# Patient Record
Sex: Female | Born: 1970 | Race: White | Hispanic: No | Marital: Married | Smoking: Former smoker
Health system: Southern US, Community
[De-identification: ages and names within clinical notes are randomized; demographics above are authoritative.]

## PROBLEM LIST (undated history)

## (undated) DIAGNOSIS — L4050 Arthropathic psoriasis, unspecified: Secondary | ICD-10-CM

## (undated) DIAGNOSIS — Z5189 Encounter for other specified aftercare: Secondary | ICD-10-CM

## (undated) DIAGNOSIS — I2510 Atherosclerotic heart disease of native coronary artery without angina pectoris: Secondary | ICD-10-CM

## (undated) DIAGNOSIS — F419 Anxiety disorder, unspecified: Secondary | ICD-10-CM

## (undated) DIAGNOSIS — I10 Essential (primary) hypertension: Secondary | ICD-10-CM

## (undated) DIAGNOSIS — E785 Hyperlipidemia, unspecified: Secondary | ICD-10-CM

## (undated) DIAGNOSIS — I639 Cerebral infarction, unspecified: Secondary | ICD-10-CM

## (undated) HISTORY — PX: FOOT SURGERY: SHX648

## (undated) HISTORY — DX: Encounter for other specified aftercare: Z51.89

## (undated) HISTORY — PX: HERNIA REPAIR: SHX51

## (undated) HISTORY — DX: Hyperlipidemia, unspecified: E78.5

## (undated) HISTORY — PX: CARPAL TUNNEL RELEASE: SHX101

## (undated) HISTORY — PX: CHOLECYSTECTOMY: SHX55

## (undated) HISTORY — PX: TOTAL HIP ARTHROPLASTY: SHX124

---

## 1999-12-22 ENCOUNTER — Other Ambulatory Visit: Admission: RE | Admit: 1999-12-22 | Discharge: 1999-12-22 | Payer: Self-pay | Admitting: Obstetrics and Gynecology

## 2000-06-07 ENCOUNTER — Other Ambulatory Visit: Admission: RE | Admit: 2000-06-07 | Discharge: 2000-06-07 | Payer: Self-pay | Admitting: Obstetrics and Gynecology

## 2001-01-01 ENCOUNTER — Ambulatory Visit (HOSPITAL_BASED_OUTPATIENT_CLINIC_OR_DEPARTMENT_OTHER): Admission: RE | Admit: 2001-01-01 | Discharge: 2001-01-01 | Payer: Self-pay | Admitting: Orthopedic Surgery

## 2001-01-17 ENCOUNTER — Ambulatory Visit (HOSPITAL_BASED_OUTPATIENT_CLINIC_OR_DEPARTMENT_OTHER): Admission: RE | Admit: 2001-01-17 | Discharge: 2001-01-17 | Payer: Self-pay | Admitting: Orthopedic Surgery

## 2001-03-18 ENCOUNTER — Other Ambulatory Visit: Admission: RE | Admit: 2001-03-18 | Discharge: 2001-03-18 | Payer: Self-pay | Admitting: *Deleted

## 2001-10-03 ENCOUNTER — Inpatient Hospital Stay (HOSPITAL_COMMUNITY): Admission: AD | Admit: 2001-10-03 | Discharge: 2001-10-06 | Payer: Self-pay | Admitting: *Deleted

## 2001-10-09 ENCOUNTER — Encounter: Admission: RE | Admit: 2001-10-09 | Discharge: 2001-11-08 | Payer: Self-pay | Admitting: Obstetrics and Gynecology

## 2001-10-30 ENCOUNTER — Other Ambulatory Visit: Admission: RE | Admit: 2001-10-30 | Discharge: 2001-10-30 | Payer: Self-pay | Admitting: Obstetrics and Gynecology

## 2002-11-04 ENCOUNTER — Other Ambulatory Visit: Admission: RE | Admit: 2002-11-04 | Discharge: 2002-11-04 | Payer: Self-pay | Admitting: Obstetrics and Gynecology

## 2002-12-31 ENCOUNTER — Encounter (INDEPENDENT_AMBULATORY_CARE_PROVIDER_SITE_OTHER): Payer: Self-pay | Admitting: *Deleted

## 2002-12-31 ENCOUNTER — Ambulatory Visit (HOSPITAL_COMMUNITY): Admission: RE | Admit: 2002-12-31 | Discharge: 2002-12-31 | Payer: Self-pay | Admitting: Obstetrics and Gynecology

## 2003-12-15 ENCOUNTER — Other Ambulatory Visit: Admission: RE | Admit: 2003-12-15 | Discharge: 2003-12-15 | Payer: Self-pay | Admitting: Obstetrics and Gynecology

## 2004-02-29 ENCOUNTER — Encounter (INDEPENDENT_AMBULATORY_CARE_PROVIDER_SITE_OTHER): Payer: Self-pay | Admitting: Specialist

## 2004-02-29 ENCOUNTER — Ambulatory Visit (HOSPITAL_COMMUNITY): Admission: RE | Admit: 2004-02-29 | Discharge: 2004-02-29 | Payer: Self-pay | Admitting: Obstetrics and Gynecology

## 2004-09-03 ENCOUNTER — Inpatient Hospital Stay (HOSPITAL_COMMUNITY): Admission: AD | Admit: 2004-09-03 | Discharge: 2004-09-03 | Payer: Self-pay | Admitting: Obstetrics and Gynecology

## 2004-09-14 ENCOUNTER — Other Ambulatory Visit: Admission: RE | Admit: 2004-09-14 | Discharge: 2004-09-14 | Payer: Self-pay | Admitting: Obstetrics and Gynecology

## 2005-03-12 ENCOUNTER — Inpatient Hospital Stay (HOSPITAL_COMMUNITY): Admission: AD | Admit: 2005-03-12 | Discharge: 2005-03-12 | Payer: Self-pay | Admitting: Obstetrics and Gynecology

## 2005-03-26 ENCOUNTER — Inpatient Hospital Stay (HOSPITAL_COMMUNITY): Admission: AD | Admit: 2005-03-26 | Discharge: 2005-03-29 | Payer: Self-pay | Admitting: Obstetrics and Gynecology

## 2005-05-03 ENCOUNTER — Other Ambulatory Visit: Admission: RE | Admit: 2005-05-03 | Discharge: 2005-05-03 | Payer: Self-pay | Admitting: Obstetrics and Gynecology

## 2005-12-21 ENCOUNTER — Ambulatory Visit (HOSPITAL_BASED_OUTPATIENT_CLINIC_OR_DEPARTMENT_OTHER): Admission: RE | Admit: 2005-12-21 | Discharge: 2005-12-21 | Payer: Self-pay | Admitting: Orthopedic Surgery

## 2006-01-01 ENCOUNTER — Encounter: Admission: RE | Admit: 2006-01-01 | Discharge: 2006-01-01 | Payer: Self-pay | Admitting: Orthopedic Surgery

## 2007-06-06 ENCOUNTER — Inpatient Hospital Stay (HOSPITAL_COMMUNITY): Admission: AD | Admit: 2007-06-06 | Discharge: 2007-06-07 | Payer: Self-pay | Admitting: Obstetrics & Gynecology

## 2007-06-29 ENCOUNTER — Encounter (INDEPENDENT_AMBULATORY_CARE_PROVIDER_SITE_OTHER): Payer: Self-pay | Admitting: Obstetrics and Gynecology

## 2007-06-29 ENCOUNTER — Inpatient Hospital Stay (HOSPITAL_COMMUNITY): Admission: AD | Admit: 2007-06-29 | Discharge: 2007-07-01 | Payer: Self-pay | Admitting: Obstetrics and Gynecology

## 2008-09-21 ENCOUNTER — Inpatient Hospital Stay (HOSPITAL_COMMUNITY): Admission: AD | Admit: 2008-09-21 | Discharge: 2008-09-21 | Payer: Self-pay | Admitting: Obstetrics and Gynecology

## 2008-10-09 ENCOUNTER — Inpatient Hospital Stay (HOSPITAL_COMMUNITY): Admission: AD | Admit: 2008-10-09 | Discharge: 2008-10-09 | Payer: Self-pay | Admitting: Obstetrics and Gynecology

## 2008-10-10 ENCOUNTER — Inpatient Hospital Stay (HOSPITAL_COMMUNITY): Admission: AD | Admit: 2008-10-10 | Discharge: 2008-10-10 | Payer: Self-pay | Admitting: Obstetrics and Gynecology

## 2008-10-15 ENCOUNTER — Encounter (INDEPENDENT_AMBULATORY_CARE_PROVIDER_SITE_OTHER): Payer: Self-pay | Admitting: Obstetrics and Gynecology

## 2008-10-15 ENCOUNTER — Inpatient Hospital Stay (HOSPITAL_COMMUNITY): Admission: AD | Admit: 2008-10-15 | Discharge: 2008-10-17 | Payer: Self-pay | Admitting: Obstetrics and Gynecology

## 2008-10-29 ENCOUNTER — Ambulatory Visit (HOSPITAL_COMMUNITY): Admission: RE | Admit: 2008-10-29 | Discharge: 2008-10-29 | Payer: Self-pay | Admitting: Obstetrics and Gynecology

## 2008-10-29 ENCOUNTER — Encounter (INDEPENDENT_AMBULATORY_CARE_PROVIDER_SITE_OTHER): Payer: Self-pay | Admitting: Obstetrics and Gynecology

## 2008-12-15 ENCOUNTER — Ambulatory Visit (HOSPITAL_COMMUNITY): Admission: RE | Admit: 2008-12-15 | Discharge: 2008-12-15 | Payer: Self-pay | Admitting: Obstetrics and Gynecology

## 2008-12-24 ENCOUNTER — Ambulatory Visit (HOSPITAL_BASED_OUTPATIENT_CLINIC_OR_DEPARTMENT_OTHER): Admission: RE | Admit: 2008-12-24 | Discharge: 2008-12-24 | Payer: Self-pay | Admitting: Orthopedic Surgery

## 2009-10-26 ENCOUNTER — Ambulatory Visit (HOSPITAL_BASED_OUTPATIENT_CLINIC_OR_DEPARTMENT_OTHER): Admission: RE | Admit: 2009-10-26 | Discharge: 2009-10-26 | Payer: Self-pay | Admitting: Orthopedic Surgery

## 2010-05-23 ENCOUNTER — Ambulatory Visit: Payer: Self-pay | Admitting: Pulmonary Disease

## 2010-05-24 ENCOUNTER — Telehealth: Payer: Self-pay | Admitting: Pulmonary Disease

## 2010-05-31 ENCOUNTER — Telehealth (INDEPENDENT_AMBULATORY_CARE_PROVIDER_SITE_OTHER): Payer: Self-pay | Admitting: *Deleted

## 2010-06-04 DIAGNOSIS — G4710 Hypersomnia, unspecified: Secondary | ICD-10-CM

## 2010-06-05 ENCOUNTER — Telehealth (INDEPENDENT_AMBULATORY_CARE_PROVIDER_SITE_OTHER): Payer: Self-pay | Admitting: *Deleted

## 2010-06-20 ENCOUNTER — Telehealth: Payer: Self-pay | Admitting: Pulmonary Disease

## 2010-07-11 ENCOUNTER — Telehealth (INDEPENDENT_AMBULATORY_CARE_PROVIDER_SITE_OTHER): Payer: Self-pay | Admitting: *Deleted

## 2010-07-24 ENCOUNTER — Telehealth: Payer: Self-pay | Admitting: Pulmonary Disease

## 2010-08-02 ENCOUNTER — Telehealth (INDEPENDENT_AMBULATORY_CARE_PROVIDER_SITE_OTHER): Payer: Self-pay | Admitting: *Deleted

## 2010-08-16 ENCOUNTER — Ambulatory Visit: Payer: Self-pay | Admitting: Pulmonary Disease

## 2010-09-05 DIAGNOSIS — G2581 Restless legs syndrome: Secondary | ICD-10-CM | POA: Insufficient documentation

## 2010-10-03 NOTE — Assessment & Plan Note (Signed)
Summary: consult for hypersomnia   Visit Type:  Initial Consult Copy to:  Harold Hedge Primary Provider/Referring Provider:  Harold Hedge  CC:  sleep consult.Marland Kitchen  History of Present Illness: the pt is a 40y/o female who I have been asked to see for severe fatigue and daytime sleepiness over the last 3-50mos.  The pt has 4 children ages 19mos, 3y/o, 39 y/o 24 y/o, and they keep her awake 5/7 nights.  They may awaken her 1-3 times a night.  She goes to bed btw 8-9pm, and arises at 6am to start her day.  She does not feel rested upon arising.  Her husband has not complained about snoring or kicking, and the pt denies symptoms of RLS.  She uses caffeine to help with her alertness during the day, and notes significant daytime sleepiness is she sits down during the day.  She also describes severe fatigue.  She denies any sleepiness driving.  Her epworth score today is 18.  She has had a thyroid and hormonal evaluation by GYN with nothing being found.  Her weight is up about 15 pounds since her delivery.  Preventive Screening-Counseling & Management  Alcohol-Tobacco     Smoking Status: never  Problems Prior to Update: None  Current Medications (verified): 1)  Fish Oil 1200 Mg Caps (Omega-3 Fatty Acids) .... Once Daily 2)  Vitamin D 1000 Unit Tabs (Cholecalciferol) .... Once Daily 3)  Super B Complex  Tabs (B Complex-C) .... Once Daily 4)  Daily Multiple Vitamins  Tabs (Multiple Vitamin) .... Once Daily 5)  Lexapro 20 Mg Tabs (Escitalopram Oxalate) .... Once Daily  Allergies (verified): No Known Drug Allergies  Past History:  Past Medical History: none per pt  Past Surgical History: Cholecystectomy sinus surgery tubal ligation Right knee surgery  Family History: Reviewed history and no changes required. Emphysema--MGM Asthma--sister Colon cancer--PGF, MGF  Social History: Reviewed history and no changes required. Patient never smoked.  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  The patient denies shortness of breath with activity, shortness of breath at rest, non-productive cough, coughing up blood, chest pain, irregular heartbeats, acid heartburn, indigestion, loss of appetite, abdominal pain, difficulty swallowing, sore throat, tooth/dental problems, headaches, itching, ear ache, anxiety, depression, hand/feet swelling, joint stiffness or pain, rash, change in color of mucus, and fever.    Vital Signs:  Patient profile:   40 year old female Height:      64 inches Weight:      196.50 pounds BMI:     33.85 O2 Sat:      100 % on Room air Temp:     98.4 degrees F oral Pulse rate:   94 / minute BP sitting:   150 / 92  (right arm) Cuff size:   regular  Vitals Entered By: Carver Fila (May 23, 2010 10:52 AM)  O2 Flow:  Room air CC: sleep consult. Comments meds and allergies updated Phone number updated Carver Fila  May 23, 2010 10:52 AM    Physical Exam  General:  ow female in nad Nose:  patent without discharge, no purulence Mouth:  normal anatomy, no lesions or exudates Neck:  no jvd, tmg, LN Lungs:  totally clear to auscultation. Heart:  rrr, no mrg Abdomen:  soft and nontender, bs+ Extremities:  no edema or cyanosis  pulses intact distally. Neurologic:  alert, but appears sleepy, moves all 4.   Impression & Recommendations:  Problem # 1:  HYPERSOMNIA (ICD-780.54)  the pt has significant daytime sleepiness that is most likely due to extremely disrupted sleep associated with the care of her children.  She is awakened 1-3 times a night, 5 out of 7 nights.  She denies any symptoms c/w a sleep disorder, and she apparently has had a hormonal evaluation by GYN.  I have given her a few things to consider, and she may also  discuss the sleep issues with her pediiatrician since it involves all of her children and not just the 19mos old.  Finally, I have asked her to talk with her husband about loud snoring or kicking , and to let me know if this is an issue.  Medications Added to Medication List This Visit: 1)  Fish Oil 1200 Mg Caps (Omega-3 fatty acids) .... Once daily 2)  Vitamin D 1000 Unit Tabs (Cholecalciferol) .... Once daily 3)  Super B Complex Tabs (B complex-c) .... Once daily 4)  Daily Multiple Vitamins Tabs (Multiple vitamin) .... Once daily 5)  Lexapro 20 Mg Tabs (Escitalopram oxalate) .... Once daily  Other Orders: Consultation Level IV (40981)  Patient Instructions: 1)  please ask husband if you kick during the night or have snoring.  Let me know if this is the case. 2)  work out shifts with husband to take care of the kids during the night since you are struggling so much during day 3)  consider baby sitters for 1-2 hours during afternoon to allow you to take short naps. 4)  moderate use of caffeine. 5)  If continues, can consider behavioral medicine consult for possible depression?

## 2010-10-03 NOTE — Progress Notes (Signed)
Summary: update on requip > will continue requip.  f/u appt 11.18.11  Phone Note Call from Patient Call back at [phone]   Caller: Patient Call For: clance Summary of Call: pt still feeling foggy in the am. have questions about requip. Initial call taken by: Rickard Patience,  July 11, 2010 9:04 AM  Follow-up for Phone Call        called spoke with patient who states that requip is still working very well for her and the afternoon fogginess has improved, but states the fogginess is still significant in the mornings.  pt states she has adjusted the times that she takes the requip to see if that helps, but finds that dinner time works best for her.  would like to know KC's recs. Boone Master CNA/MA  July 11, 2010 9:57 AM   Additional Follow-up for Phone Call Additional follow up Details #1::        I think it comes down to what you can accept and what you can't.  We can try other medications that are in the requip family...i.e. mirapex or gabapentin. Or, she can keep with requip for now and see how things go.  I am not sure her fogginess during the day is really due to the mirapex...don't know.  If she wants to try mirapex 0.25mg  in the evening at the time of her choosing on full stomach preferrably.  Can call in #30, no fills. Additional Follow-up by: Barbaraann Share MD,  July 11, 2010 10:43 AM    Additional Follow-up for Phone Call Additional follow up Details #2::    called spoke with patient who prefers to continue the requip.  she does request to follow up w/ [facility] about this before the holidays.  appt scheduled 11.18.11 @ 1415.  pt okay with this date and time. Boone Master CNA/MA  July 11, 2010 11:28 AM

## 2010-10-03 NOTE — Progress Notes (Signed)
Summary: requip working well  Phone Note Call from Patient Call back at [phone]   Caller: Patient Call For: Jaculin Rasmus Summary of Call: pt want to report that requip is working  fine for her. she was told to call with update. Initial call taken by: Rickard Patience,  June 20, 2010 8:43 AM  Follow-up for Phone Call        Pt states requip is workin well in that it is helping her sleep more solid, she  is not kicking in her sleep and she doesn ot feel tired inthe afternoons. Pt does states she feels "foggy" in the early afternoon, not tired just feels like she is "not with it." Please advise. Carron Curie CMA  June 20, 2010 9:50 AM   Additional Follow-up for Phone Call Additional follow up Details #1::        glad requip has helped.  would stay on the medication for now and see if things fall into place with more time.  I don't think the requip is responsible for her afternoon fogginess...she does have alot going on right now, and I would give more time to improve.  Needs to see me in 6mos. Additional Follow-up by: Barbaraann Share MD,  June 20, 2010 12:52 PM    Additional Follow-up for Phone Call Additional follow up Details #2::    Pt advised and will call back and schedule appt. Carron Curie CMA  June 20, 2010 2:03 PM

## 2010-10-03 NOTE — Progress Notes (Signed)
Summary: nos appt  Phone Note Call from Patient   Caller: [email or web address]  Call For: [name] [name] Summary of Call: LMTCB x2 to rsc nos from 11/18. Initial call taken by: Darletta Moll,  July 24, 2010 2:47 PM

## 2010-10-03 NOTE — Progress Notes (Signed)
Summary: leg movements at night-rx requip  Phone Note Call from Patient Call back at Home Phone [phone] Call back at [phone] cell   Caller: Patient Call For: clance Reason for Call: Talk to Nurse Summary of Call: question about whether or not pt kicks in her sleep - answer per her husband is yes. Initial call taken by: Eugene Gavia,  May 24, 2010 8:27 AM  Follow-up for Phone Call        Pt states that husband says she does kick during the night when she is asleep. He states she does not snore. Carron Curie CMA  May 24, 2010 9:28 AM  [facility]  Additional Follow-up for Phone Call Additional follow up Details #1::        let her know that she may have sleep disruption from what we call period leg movements.  We usually treat with a medication called requip...will know in 3 weeks whether helping or not.  Can't take if you are breast feeding for some reason. can try requip 0.5mg   one each night after dinner.  If helps, but still kicking, can increase to 2 tabs.  give me feedback in 3 weeks to let me know if better. call in #42, no fills. Additional Follow-up by: Barbaraann Share MD,  May 24, 2010 10:03 AM    Additional Follow-up for Phone Call Additional follow up Details #2::    Pt advised of recs and rx sent. pt advisd to call in 3 weeks to let us know progress. Carron Curie CMA  May 24, 2010 10:12 AM   New/Updated Medications: REQUIP 0.5 MG TABS (ROPINIROLE HCL) one tablet daily after dinner, if helps but still kicking increase to 2 tablets Prescriptions: REQUIP 0.5 MG TABS (ROPINIROLE HCL) one tablet daily after dinner, if helps but still kicking increase to 2 tablets  #42 x 0   Entered by:   Carron Curie CMA   Authorized by:   Barbaraann Share MD   Signed by:   Carron Curie CMA on 05/24/2010   Method used:   [facility]        [facility]  [address] [phone]* (retail)       [address]       [place], [place]  [id number]       Ph: [phone] or [phone]       Fax: [phone]   RxID:   [id number]

## 2010-10-03 NOTE — Progress Notes (Signed)
Summary: NEEDS REFILL ASAP - LMTCB x 1  Phone Note Call from Patient Call back at Home Phone [phone]   Caller: Patient Call For: [name] [name] Summary of Call: PT IS COMPLETELY OUT OF REQUIP. WAITING ON REFILL. [facility] [address]  Initial call taken by: Tivis Ringer, CNA,  August 02, 2010 10:54 AM  Follow-up for Phone Call        [facility] to verify message. Gweneth Dimitri RN  August 02, 2010 12:43 PM  Spoke with pt and advised refill was sent to pharm.  Pt verbalized understanding. Follow-up by: Vernie Murders,  August 02, 2010 2:49 PM    Prescriptions: REQUIP 0.5 MG TABS (ROPINIROLE HCL) one tablet daily after dinner, if helps but still kicking increase to 2 tablets  #42 Tablet x 5   Entered by:   Vernie Murders   Authorized by:   Barbaraann Share MD   Signed by:   Vernie Murders on 08/02/2010   Method used:   [facility]        [facility]  [address] [phone]* (retail)       [address]       [place], [place]  [id number]       Ph: [phone] or [phone]       Fax: [phone]   RxID:   [id number]

## 2010-10-03 NOTE — Progress Notes (Signed)
Summary: update on requip  Phone Note Call from Patient Call back at [phone]   Caller: Patient Call For: clance Summary of Call: Pt states the requip is doing great except she feels foggy in the mornings and early afternoons, pls advise.//[facility] [address] Initial call taken by: Darletta Moll,  June 05, 2010 8:23 AM  Follow-up for Phone Call        Spoke with pt.  She states that she has been taking her reuip in the early afternoons and this is not helping with feeling "foggy".  She states that she feels foggy in the am and usually does not start to feel normal until around noon.  She states that she has been taking naps during the day when her kids sleep and her spouse has been better about waking up with the kids in the night so she is sleeping better.  Pls advise thanks!  Follow-up by: Vernie Murders,  June 05, 2010 9:33 AM  Additional Follow-up for Phone Call Additional follow up Details #1::        does she think the requip has helped kicking and her actual sleep at night??  Does she think the fogginess could be from chronic sleep deprivation? or has it happened only since she has been on requip? Additional Follow-up by: Barbaraann Share MD,  June 05, 2010 5:17 PM    Additional Follow-up for Phone Call Additional follow up Details #2::    LMOM TCB. Boone Master CNA/MA  June 05, 2010 5:28 PM   Spoke with pt.  She says requip has helped kicking and actual sleep at night.  States she does not think the fogginess could be coming from chronic sleep deprivation bc she is having extra help as suggested by [facility].  Also states the fogginess has been going on prior to starting requip.  Will forward message back to [facility].   Gweneth Dimitri RN  June 05, 2010 5:32 PM   Additional Follow-up for Phone Call Additional follow up Details #3:: Details for Additional Follow-up Action Taken: If she had the fogginess before starting requip, then it is not the requip causing it.  I suspect  she is just making up her sleep debt now, and she has not caught up.  She if she is willing to give this another 4 weeks or so to see if it slowly improves.  I don't have any other sleep recommendations other than what we have discussed. Additional Follow-up by: Barbaraann Share MD,  June 05, 2010 5:38 PM    I spoke with patient; she is aware of KC's recs. She will give the medication another 4 weeks to see if this will slow down; she will call us back in 4 weeks to let us know how she is doing. Reynaldo Minium CMA  June 06, 2010 10:41 AM

## 2010-10-03 NOTE — Progress Notes (Signed)
Summary: questions regarding requip- feels sluggish  Phone Note Call from Patient   Caller: Patient Call For: clance Summary of Call: requip is working but she wakes up in the am feeling sluggish. Initial call taken by: Rickard Patience,  May 31, 2010 8:51 AM  Follow-up for Phone Call        called and spoke with pt.  pt states she is currently on Requip 0.5mg   and takes 1 tab after dinner.  Pt states her sleep has improved but c/o waking up feeling "sluggish, spacey, and thoughts are delayed."  Pt states she starts to feel better in the afternoons.  Please advise.  Thank you.  Aundra Millet Reynolds LPN  May 31, 2010 9:26 AM   Additional Follow-up for Phone Call Additional follow up Details #1::        chronic sleep deprivation can cause delayed thoughts and sluggishness. have her try to take requip in late afternoon rather than after dinner and see if she notices a difference.  this may not be the answer to her problems, and this may be just what we discussed..not sleeping enough and fragmented sleep due to kids. Additional Follow-up by: Barbaraann Share MD,  May 31, 2010 2:01 PM    Additional Follow-up for Phone Call Additional follow up Details #2::    called and spoke with pt.  pt aware of KC's response and recs.  Pt states she will try taking Requip in the late afternoon and see if this helps.   Aundra Millet Reynolds LPN  May 31, 2010 2:37 PM

## 2010-10-05 NOTE — Assessment & Plan Note (Signed)
Summary: rov for PLMS, sleep hygiene issues   Copy to:  Harold Hedge Primary Provider/Referring Provider:  Harold Hedge  CC:  Pt is here for a routine f/u appt.  Pt states Requip helps with leg jerks at night but does c/o "fogginess in the mornings." .  History of Present Illness: the pt comes in today for f/u of her disrupted sleep and hypersomnia/fogginess.  At the last visit, I felt a lot of this was due to disrupted sleep from her young children.  I had asked her though to discuss with her husband the possibility of leg kicks. She did  feel this was an issue, and was started on requip as a trial.  She has seen definite improvement in her sleep and how she feels the next day.  Her husband has been helping with the children during the night as well.  She continues to have some "fogginess" in the mornings upon arising, but feels rested.  She is not having a lot of EDS.  Medications Prior to Update: 1)  Fish Oil 1200 Mg Caps (Omega-3 Fatty Acids) .... Once Daily 2)  Vitamin D 1000 Unit Tabs (Cholecalciferol) .... Once Daily 3)  Super B Complex  Tabs (B Complex-C) .... Once Daily 4)  Daily Multiple Vitamins  Tabs (Multiple Vitamin) .... Once Daily 5)  Lexapro 20 Mg Tabs (Escitalopram Oxalate) .... Once Daily 6)  Requip 0.5 Mg Tabs (Ropinirole Hcl) .... One Tablet Daily After Dinner, If Helps But Still Kicking Increase To 2 Tablets  Allergies (verified): No Known Drug Allergies  Review of Systems  The patient denies shortness of breath with activity, shortness of breath at rest, productive cough, non-productive cough, coughing up blood, chest pain, irregular heartbeats, acid heartburn, indigestion, loss of appetite, weight change, abdominal pain, difficulty swallowing, sore throat, tooth/dental problems, headaches, nasal congestion/difficulty breathing through nose, sneezing, itching, ear ache, anxiety, depression, hand/feet swelling, joint stiffness or pain, rash, change in color of mucus,  and fever.    Vital Signs:  Patient profile:   40 year old female Height:      64 inches Weight:      201 pounds BMI:     34.63 O2 Sat:      99 % on Room air Temp:     97.7 degrees F oral Pulse rate:   88 / minute BP sitting:   132 / 90  (right arm) Cuff size:   large  Vitals Entered By: Arman Filter LPN (September 05, 2010 9:18 AM)  O2 Flow:  Room air CC: Pt is here for a routine f/u appt.  Pt states Requip helps with leg jerks at night but does c/o "fogginess in the mornings."  Comments Medications reviewed with patient Arman Filter LPN  September 05, 2010 9:20 AM    Physical Exam  General:  ow female in nad Extremities:  no edema or cyanosis  Neurologic:  alert, does not appear sleepy, moves all 4.   Impression & Recommendations:  Problem # 1:  RESTLESS LEGS SYNDROME (ICD-333.94)  the pt is doing much better on requip, and feels it has greatly helped her sleep.  I have asked her to continue with the medication, and to let me know if symptoms worsen.    Problem # 2:  HYPERSOMNIA (ICD-780.54) the pt is doing much better with treatment of PLMS, and has improved sleep hygiene as well with assistance of her husband with the children during the night.  There is nothing to suggest  another underlying sleep disorder, but would suggest npsg if she has worsening symptoms.  Medications Added to Medication List This Visit: 1)  Requip 0.5 Mg Tabs (Ropinirole hcl) .... As directed. 2)  Requip 0.5 Mg Tabs (Ropinirole hcl) .... As directed  Other Orders: Est. Patient Level III (14782)  Patient Instructions: 1)  continue requip, work on sleep hygiene as much as you can 2)  work on weight reduction 3)  if doing well, will see you in one year.  Prescriptions: REQUIP 0.5 MG  TABS (ROPINIROLE HCL) as directed  #180 x 4   Entered and Authorized by:   Barbaraann Share MD   Signed by:   Barbaraann Share MD on 09/05/2010   Method used:   Print then Give to Patient   RxID:    [id number] REQUIP 0.5 MG  TABS (ROPINIROLE HCL) as directed.  #60 x 1   Entered and Authorized by:   Barbaraann Share MD   Signed by:   Barbaraann Share MD on 09/05/2010   Method used:   Print then Give to Patient   RxID:   [id number]

## 2010-11-22 LAB — POCT HEMOGLOBIN-HEMACUE: Hemoglobin: 13.8 g/dL (ref 12.0–15.0)

## 2010-12-13 LAB — CBC
RBC: 4.09 MIL/uL (ref 3.87–5.11)
WBC: 10.6 10*3/uL — ABNORMAL HIGH (ref 4.0–10.5)

## 2010-12-13 LAB — HCG, SERUM, QUALITATIVE: Preg, Serum: NEGATIVE

## 2010-12-19 LAB — URINALYSIS, ROUTINE W REFLEX MICROSCOPIC
Bilirubin Urine: NEGATIVE
Hgb urine dipstick: NEGATIVE
Nitrite: NEGATIVE
Specific Gravity, Urine: 1.005 (ref 1.005–1.030)
Specific Gravity, Urine: <1.005 — ABNORMAL LOW (ref 1.005–1.030)
Urobilinogen, UA: 0.2 mg/dL (ref 0.0–1.0)
pH: 6.5 (ref 5.0–8.0)

## 2010-12-19 LAB — CBC
HCT: 27 % — ABNORMAL LOW (ref 36.0–46.0)
HCT: 32.6 % — ABNORMAL LOW (ref 36.0–46.0)
Hemoglobin: 10.5 g/dL — ABNORMAL LOW (ref 12.0–15.0)
Hemoglobin: 10.7 g/dL — ABNORMAL LOW (ref 12.0–15.0)
Hemoglobin: 9.1 g/dL — ABNORMAL LOW (ref 12.0–15.0)
Hemoglobin: 10.4 g/dL — ABNORMAL LOW (ref 12.0–15.0)
MCHC: 32.4 g/dL (ref 30.0–36.0)
MCHC: 32.8 g/dL (ref 30.0–36.0)
MCHC: 32.9 g/dL (ref 30.0–36.0)
MCHC: 32.4 g/dL (ref 30.0–36.0)
MCV: 87.4 fL (ref 78.0–100.0)
MCV: 87.5 fL (ref 78.0–100.0)
MCV: 88.4 fL (ref 78.0–100.0)
Platelets: 177 10*3/uL (ref 150–400)
Platelets: 193 10*3/uL (ref 150–400)
Platelets: 224 10*3/uL (ref 150–400)
RBC: 3.59 MIL/uL — ABNORMAL LOW (ref 3.87–5.11)
RBC: 3.73 MIL/uL — ABNORMAL LOW (ref 3.87–5.11)
RBC: 3.63 MIL/uL — ABNORMAL LOW (ref 3.87–5.11)
RDW: 13.4 % (ref 11.5–15.5)
RDW: 13.8 % (ref 11.5–15.5)
RDW: 13.9 % (ref 11.5–15.5)
WBC: 11.7 10*3/uL — ABNORMAL HIGH (ref 4.0–10.5)
WBC: 17 10*3/uL — ABNORMAL HIGH (ref 4.0–10.5)
WBC: 8.8 10*3/uL (ref 4.0–10.5)
WBC: 5.6 10*3/uL (ref 4.0–10.5)

## 2010-12-19 LAB — LACTATE DEHYDROGENASE
LDH: 226 U/L (ref 94–250)
LDH: 232 U/L (ref 94–250)
LDH: 270 U/L — ABNORMAL HIGH (ref 94–250)

## 2010-12-19 LAB — DIFFERENTIAL
Basophils Relative: 0 % (ref 0–1)
Lymphs Abs: 2.1 10*3/uL (ref 0.7–4.0)
Monocytes Absolute: 0.4 10*3/uL (ref 0.1–1.0)
Monocytes Relative: 4 % (ref 3–12)
Neutro Abs: 8.8 10*3/uL — ABNORMAL HIGH (ref 1.7–7.7)

## 2010-12-19 LAB — COMPREHENSIVE METABOLIC PANEL
ALT: 38 U/L — ABNORMAL HIGH (ref 0–35)
ALT: 41 U/L — ABNORMAL HIGH (ref 0–35)
ALT: 50 U/L — ABNORMAL HIGH (ref 0–35)
AST: 39 U/L — ABNORMAL HIGH (ref 0–37)
AST: 48 U/L — ABNORMAL HIGH (ref 0–37)
Albumin: 2.1 g/dL — ABNORMAL LOW (ref 3.5–5.2)
Albumin: 2.3 g/dL — ABNORMAL LOW (ref 3.5–5.2)
Albumin: 2.9 g/dL — ABNORMAL LOW (ref 3.5–5.2)
Alkaline Phosphatase: 104 U/L (ref 39–117)
Alkaline Phosphatase: 108 U/L (ref 39–117)
Alkaline Phosphatase: 136 U/L — ABNORMAL HIGH (ref 39–117)
BUN: 2 mg/dL — ABNORMAL LOW (ref 6–23)
BUN: 2 mg/dL — ABNORMAL LOW (ref 6–23)
BUN: 5 mg/dL — ABNORMAL LOW (ref 6–23)
BUN: 8 mg/dL (ref 6–23)
CO2: 24 mEq/L (ref 19–32)
CO2: 24 mEq/L (ref 19–32)
Calcium: 7.2 mg/dL — ABNORMAL LOW (ref 8.4–10.5)
Calcium: 8.6 mg/dL (ref 8.4–10.5)
Calcium: 8.6 mg/dL (ref 8.4–10.5)
Chloride: 104 mEq/L (ref 96–112)
Creatinine, Ser: 0.56 mg/dL (ref 0.4–1.2)
Creatinine, Ser: 0.59 mg/dL (ref 0.4–1.2)
GFR calc Af Amer: >60 mL/min (ref >60)
GFR calc non Af Amer: >60 mL/min (ref >60)
Glucose, Bld: 117 mg/dL — ABNORMAL HIGH (ref 70–99)
Glucose, Bld: 137 mg/dL — ABNORMAL HIGH (ref 70–99)
Potassium: 3.7 mEq/L (ref 3.5–5.1)
Potassium: 3.8 mEq/L (ref 3.5–5.1)
Potassium: 4.3 mEq/L (ref 3.5–5.1)
Sodium: 134 mEq/L — ABNORMAL LOW (ref 135–145)
Sodium: 135 mEq/L (ref 135–145)
Sodium: 136 mEq/L (ref 135–145)
Total Bilirubin: 0.3 mg/dL (ref 0.3–1.2)
Total Protein: 4.5 g/dL — ABNORMAL LOW (ref 6.0–8.3)
Total Protein: 5.7 g/dL — ABNORMAL LOW (ref 6.0–8.3)
Total Protein: 6.1 g/dL (ref 6.0–8.3)

## 2010-12-19 LAB — RPR: RPR Ser Ql: NONREACTIVE

## 2010-12-19 LAB — URIC ACID: Uric Acid, Serum: 5.9 mg/dL (ref 2.4–7.0)

## 2011-01-16 NOTE — Op Note (Signed)
[name] [name], [name] [name]           ACCOUNT NO.:  [id number]   MEDICAL RECORD NO.:  [id number]          PATIENT TYPE:  AMB   LOCATION:  SDC                           FACILITY:  [facility]   PHYSICIAN:  Guy Sandifer. Henderson Cloud, M.D. DATE OF BIRTH:  May 22, 1971   DATE OF PROCEDURE:  DATE OF DISCHARGE:                               OPERATIVE REPORT   PREOPERATIVE DIAGNOSIS:  Desires permanent sterilization.   POSTOPERATIVE DIAGNOSIS:  Desires permanent sterilization.   PROCEDURE:  Laparoscopy with bilateral tubal ligation with Filshie  clips.   SURGEON:  Guy Sandifer. Henderson Cloud, MD   ANESTHESIA:  General endotracheal intubation.   ESTIMATED BLOOD LOSS:  Minimal.   INDICATIONS AND CONSENT:  This patient is a 40 year old married white  female G4, P4, status post vaginal delivery healthy child on October 15, 2008.  Desires permanent sterilization.  Alternatives have been  discussed.  Potential risks and complications of laparoscopic tubal  ligation with Filshie clips have been reviewed.  Preoperatively  including but not limited to infection, organ damage, bleeding,  requiring transfusion of blood products with HIV and hepatitis  acquisition, DVT, PE, pneumonia.  Permanence of the procedure, failure  rate increased, ectopic risk have been reviewed.  All questions were  answered and consent is signed on the chart.   FINDINGS:  Upper abdomen is grossly normal.  Appendix is normal.  Uterus  is normal, multiparous size, smooth in contour.  Anterior and posterior  cul-de-sacs were normal.  Tubes and ovaries normal bilaterally.   PROCEDURE IN DETAIL:  The patient is taken to the operating room where  she is identified, placed in dorsal supine position, and general  anesthesia was induced via endotracheal intubation.  She was then placed  in a dorsal lithotomy position where she was prepped abdominally and  vaginally.  Bladder straight catheterized.  Hulka tenaculum was placed.  The uterus was manipulated,  and she was draped in a sterile fashion.  Time-out was then undertaken.  The infraumbilical and suprapubic areas  injected in midline with 1% plain Xylocaine.  A small infraumbilical  incision was made.  Disposable Veress needle was placed on the first  attempt without difficulty.  A normal syringe and drop test are noted.  2 liters of gas were then insufflated under low pressure with good  tympany in the right upper quadrant.  A 10/11 Xcel bladeless disposable  trocar sleeve was then placed using direct visualization with the  diagnostic laparoscopic.  After placement, the operative laparoscope was  used.  Small suprapubic incision was made in the midline.  The 5-mm Xcel  bladeless disposable trocar sleeve was placed under direct visualization  without difficulty.  The above findings were noted.  The right fallopian  tube was identified from cornu to fimbria.  Filshie clip was placed on  the proximal one-third of the tube.  Similar procedure was carried out  on the left tube.  The applicator was then removed.  Inspection reveals  the full width of the fallopian tube was within the clip and the heel of  the clip can be seen through the mesosalpinx bilaterally.  16  mL of 1%  Xylocaine for a total of 20 mL used during the case, are then instilled  into the peritoneal cavity.  Suprapubic trocar sleeve was removed.  Good  hemostasis was noted.  Pneumoperitoneum was reduced and the umbilical  trocar sleeve was removed.  0 Vicryl suture was used to close the deeper  underlying tissues in the umbilical incision with good visualization.  A  single subcuticular 2-0 Vicryl suture was placed.  Both incisions closed  with Dermabond.  Hulka tenaculum was removed and good hemostasis was  noted.  All counts correct.  The patient is awakened, taken to recovery  room in stable condition.      Guy Sandifer Henderson Cloud, M.D.  Electronically Signed     JET/MEDQ  D:  12/15/2008  T:  12/16/2008  Job:  [id number]

## 2011-01-16 NOTE — Op Note (Signed)
[name] [name], [name] [name]           ACCOUNT NO.:  [id number]   MEDICAL RECORD NO.:  [id number]          PATIENT TYPE:  AMB   LOCATION:  DSC                          FACILITY:  [facility]   PHYSICIAN:  Harvie Junior, M.D.   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   PRINCIPAL PROCEDURE:  1. Open tibial tubercle osteotomy, so called Fulkerson procedure.  2. Arthroscopic lateral retinacular release.  3. Arthroscopic debridement of chondromalacia of the patellofemoral      joint and medial femoral condyle.   SURGEON:  Harvie Junior, MD   ASSISTANT:  Marshia Ly, PA   ANESTHESIA:  General.   BRIEF HISTORY:  [name] [name] is a 40 year old female with a long history  of having had significant right knee pain.  We treated her  conservatively for a period of time.  Ultimately, underwent lateral  retinacular release, which worked well for a period of time.  The pain  persisted, we treated with injection therapy, antiinflammatory  medication, activity modification.  Because of continued complaints of  significant pain, the patient was also taken to the operating room of  repeat evaluation of the patellofemoral joint and Fulkerson's.   PROCEDURE:  The patient was taken to the operating room.  After adequate  anesthesia obtained with general anesthetic, the patient was placed  supine on the operating table.  The right leg was then prepped and  draped in usual sterile fashion.  Following this, routine arthroscopic  examination of knee revealed that there was obvious tightness in the  inferior portion of the lateral retinaculum and some scar tissue on the  lateral side.  We debrided this thoroughly, posterior aspect of the  patella had grade 2 and grade 3 change, which we debrided in the central  area.  There was some lateral tracking and went down into the  weightbearing joint lateral area, looked great.  ACL normal.  Medial  femoral condyle unfortunately had some grade 2 wear, which we debrided.  Attention was then turned away from the arthroscopic portion of the  case.  The leg was exsanguinated with blood pressure tourniquet inflated  to 300 mmHg.  Following this, midline incision was made, subcutaneous  tissue were dissected down to the level of the extensor mechanism and  tibia.  We did a periosteal debridement and then did saw cuts to  anteriorize the tibial tubercle.  We moved her straight anterior  essentially 15 mm and this moved medially, probably about 1 cm or so.  We did 3 compressive screws of small frag hardware, 2 with washers, 1  without, got excellent fixation, bicortical fixation here, and following  this we put the knee through a range of motion grade stability, grade  midline patellar tracking, no tightness on the patellofemoral joint.  Put the camera back in the knee joint  and the patellar tracking looked  great, the space under the patella looked great.  The knee was again  copiously and thoroughly irrigated, suctioned dry, and all sterile  portals were closed with a bandage.  The periosteum was then closed over  the inferior screw and closed over the osteotomy site and then the skin  was closed with 0 and 2-0 Vicryl and Monocryl  subcuticular stitch.  Benzoin and Steri-Strips were applied.  Sterile  compression dressing was applied.  The patient was taken to the recovery  room and was noted to be in satisfactory condition.  Estimated blood  loss for this procedure was none.      Harvie Junior, M.D.  Electronically Signed     JLG/MEDQ  D:  12/24/2008  T:  12/25/2008  Job:  [id number]

## 2011-01-16 NOTE — Op Note (Signed)
[name] [name], [name] [name]           ACCOUNT NO.:  [id number]   MEDICAL RECORD NO.:  [id number]          PATIENT TYPE:  INP   LOCATION:  9372                          [facility]ILITY[facility]  [facility]   PHYSICIAN:  Guy Sandifer. Henderson Cloud, M.D. DATE OF BIRTH:  1971/01/26   DATE OF PROCEDURE:  DATE OF DISCHARGE:                               OPERATIVE REPORT   PROCEDURE:  Manual extraction of placenta.   INDICATIONS:  This patient is a 40 year old married white female G5, P3  with an EDC of 10/26/2008 placing her at 38-1/2 weeks' estimated  additional age.  Prenatal care been complicated by advanced maternal age  and history of preeclampsia.  She was noted have elevated liver function  tests in the office.  There were approximately 60s and 40s.  Blood  pressures were in the upper normal range.  She had no central nervous  system changes or epigastric pain.  She is admitted for induction of  labor secondary to rising liver function tests over 2 separate days.   PROCEDURE:  The patient is admitted to hospital and has Cervidil placed.  After approximately 1-2 hours, she complains of increasing pressure and  contractions.  Cervidil was removed, and an epidural was placed.  She  has spontaneous rupture of membranes for clear fluid.  She then has  rapid progress from 4-10 cm.  Second stage of labor shows a steady  progress over a period of approximately 20-30 minutes.  There is  spontaneous delivery of the vertex over a second-degree midline  episiotomy.  Shoulder dystocia was then encountered with a duration of  less than 1 minute.  A second labor and delivery nurse has just arrived  in the room.  Suprapubic pressure was applied.  McRoberts' position was  also used.  The anterior shoulder delivers with only a minimal traction.  Remainder of the baby delivered without difficulty.  Viable female  infant, Apgars of 8 at 9, 1 at 5 minutes respectively obtained.  Arterial cord gas and birth weight are pending.  Good cry  and tone is  noted, and good tone of the upper extremities is noted.  The placenta  then tries to delivery except for 1 edge.  This was manually extracted  and found to be adherent with one small point on the edge of the  placenta and the fundus of the uterus.  This was removed intact.  Reexploration of the uterus revealed the uterus to be clean.  Pitocin  and IV Ancef were started.  The uterus then involutes well.  Cervix and  vagina without laceration.  Second-degree midline episiotomy is repaired  in the standard fashion.  The patient will be given postpartum magnesium  sulfate.      Guy Sandifer Henderson Cloud, M.D.  Electronically Signed     JET/MEDQ  D:  10/15/2008  T:  10/16/2008  Job:  [id number]

## 2011-01-16 NOTE — H&P (Signed)
NAMECHARMANE, PROTZMAN           ACCOUNT NO.:  [id number]   MEDICAL RECORD NO.:  [id number]          PATIENT TYPE:  AMB   LOCATION:  SDC                           [facility]   PHYSICIAN:  Guy Sandifer. Henderson Cloud, M.D. DATE OF BIRTH:  February 09, 1971   DATE OF ADMISSION:  DATE OF DISCHARGE:                              HISTORY & PHYSICAL   CHIEF COMPLAINT:  Desires permanent sterilization.   HISTORY OF PRESENT ILLNESS:  The patient is a 40 year old married white  female G4, P5 who is status post vaginal delivery in October 15, 2008,  for a healthy female child.  The patient had a dilatation and curettage  postoperatively for bleeding and suspected retained products of  conception.  This was on October 29, 2008, and pathology revealed  decidualized endometrium only.  The patient requests permanent  sterilization.  Alternate methods have been discussed.  Failure rate,  permanence, increased ectopic risk and potential risks and complications  have been discussed preoperatively.  All questions have been answered.   PAST MEDICAL HISTORY:  Irritable bowel syndrome.   PAST SURGICAL HISTORY:  Arthroscopy, left maxillary sinus surgery,  dilation and evacuation, abdominal herniorrhaphy, bilateral carpal  tunnel, cholecystectomy.   FAMILY HISTORY:  Positive for heart disease, chronic hypertension, colon  cancer, emphysema, type 2 diabetes.   OBSTETRICAL HISTORY:  Vaginal delivery x4, miscarriage x1.   MEDICATIONS:  Motrin p.r.n.   No known drug allergies.   SOCIAL HISTORY:  Denies tobacco, alcohol or drug abuse.   REVIEW OF SYSTEMS:  NEUROLOGICAL:  Denies headache.  CARDIAC:  Denies  chest pain.  PULMONARY:  Denies shortness of breath.   PHYSICAL EXAMINATION:  Height 5 feet 4 inches, weight 198 pounds, blood  pressure 108/80.  LUNGS:  Clear to auscultation.  HEART:  Regular rate and rhythm.  ABDOMEN: Soft, nontender without masses.  PELVIC EXAM:  Vulva, vagina and cervix without  lesion.  Uterus upper  normal size, mobile, nontender.  Adnexa nontender without masses.  EXTREMITIES:  Grossly within normal limits.  NEUROLOGICAL EXAM:  Grossly within normal limits.   ASSESSMENT:  Multiparous, desires permanent sterilization.   PLAN:  Laparoscopic bilateral tubal ligation.      Guy Sandifer Henderson Cloud, M.D.  Electronically Signed     JET/MEDQ  D:  12/14/2008  T:  12/14/2008  Job:  [id number]

## 2011-01-16 NOTE — Op Note (Signed)
[name] [name], [name] [name]           ACCOUNT NO.:  [id number]   MEDICAL RECORD NO.:  [id number]           PATIENT TYPE:   LOCATION:                                 FACILITY:   PHYSICIAN:  Guy Sandifer. Henderson Cloud, M.D. DATE OF BIRTH:  08-06-1971   DATE OF PROCEDURE:  10/29/2008  DATE OF DISCHARGE:                               OPERATIVE REPORT   PREOPERATIVE DIAGNOSIS:  Postpartum hemorrhage.   POSTOPERATIVE DIAGNOSIS:  Postpartum hemorrhage.   PROCEDURE:  Dilatation and curettage.   SURGEON:  Guy Sandifer. Henderson Cloud, MD   ANESTHESIA:  MAC, Raul Del, MD   ESTIMATED BLOOD LOSS:  100 mL.   SPECIMENS:  Endometrial curettings to pathology.   INDICATIONS AND CONSENT:  This patient is a 40 year old married white  female status post vaginal delivery of her fourth child on 10/15/2008.  The delivery had a manual extraction of the placenta.  The uterine  cavity felt clean following that.  The patient's uterus involuted well.  However, she continued to have more bleeding than usual, passing lemon-  sized clots.  The trend appeared to be decreasing slowly.  The patient  was stable and doing okay at home.  However, it increased again where  she was again passing lemon-sized clots on and off.  No fever, no nausea  or vomiting, normal bowel and bladder function.  Ultrasound in the  office earlier this week revealed some clot in the uterine cavity, but  no obvious products of conception.  However, when her bleeding recurred  recommendation for dilatation and curettage was made.  Potential risks  and complications were discussed preoperatively with the patient  including not limited to infection, uterine perforation, organ damage,  bleeding requiring transfusion of blood products with HIV and hepatitis  acquisition, DVT, PE, pneumonia.  All questions were answered and  consent signed on the chart.   PROCEDURE:  The patient was taken to operating room where she was  identified, placed in dorsal  supine position, given intravenous  sedation.  She was placed in dorsal lithotomy position where she was  gently prepped, bladder straight catheterized and she was draped in  sterile fashion.  Examination reveals uterus to be about 8 weeks' size.  The anterior cervical lip was injected with 1% plain Xylocaine and  grasped with single-tooth tenaculum.  The paracervical block with same  solution was placed at 2, 4,5, 7, 8 and 10 o'clock positions with  approximately 20 mL total.  Cervix was already dilated and easily  dilates up to a 31 dilator.  Initially, a #6 flexible suction curette  was passed for some clot.  This was followed by a #7 curved curette  which was passed and suction curettage was carried out.  There is  possibly a small amount of tissue in this versus organized clot.  Alternating sharp and suction curettage  were carried out.  Pitocin 20 units were added to a liter of IV fluids.  The uterine cavity is clean.  Good hemostasis was noted.  The procedure  was terminated out.  All counts were correct.  The patient was awakened,  taken to  recovery room in stable condition.      Guy Sandifer Henderson Cloud, M.D.  Electronically Signed     JET/MEDQ  D:  10/29/2008  T:  10/29/2008  Job:  [id number]

## 2011-01-19 NOTE — H&P (Signed)
[facility]  Patient:    [name], [name] Visit Number: [id number] MRN: [id number]          Service Type: Attending:  Guy Sandifer. Arleta Creek, M.D. Dictated by:   Guy Sandifer Arleta Creek, M.D. Adm. Date:  10/03/01                           History and Physical  CHIEF COMPLAINT:              Labor.  HISTORY OF PRESENT ILLNESS:   This patient is a 40 year old married white female, G1, P0, with an EDC of October 18, 2001, established by dates, early examination, and 9-weeks ultrasound.  This places her at 37-6/7 weeks.  She complains of leaking fluid since the early a.m. hours of today.  No headache. No epigastric pain.  No blurry vision.  On examination in the office, she is grossly ruptured for clear fluid.  Cervix is 2 cm dilated, 50% effaced, -1 station, and vertex.  Fetal heart tones are auscultated.  PAST MEDICAL HISTORY:         Per [employer] form.  PAST SURGICAL HISTORY:        Per [employer] form.  FAMILY HISTORY:               Per [employer] form.  SOCIAL HISTORY:               Per [employer] form.  The patient was a smoker, but quit with pregnancy.  Denies alcohol or drug abuse.  CURRENT MEDICATIONS:          Prenatal vitamins.  ALLERGIES:                    No known drug allergies.  REVIEW OF SYSTEMS:            Negative, except as above.  PHYSICAL EXAMINATION:  VITAL SIGNS:                  Height 5 feet 4 inches, weight 264 pounds, blood pressure 136/86.  HEENT:                        Without thyromegaly and without periorbital edema.  LUNGS:                        Clear to auscultation.  HEART:                        Regular rate and rhythm.  ABDOMEN:                      Gravid, with no epigastric tenderness.  BREASTS:                      Examination is deferred.  PELVIC:                       Gross rupture of membrane for clear fluid. Cervix is 2 cm dilated, 50% effaced, -1 station, and vertex.  EXTREMITIES:                   With 3+ dependent edema.  Deep tendon reflexes are 3+ in the lower extremities bilaterally without clonus.  Fetal heart tones are auscultated.  LABORATORY DATA:  Group B beta Strep culture is negative on September 19, 2001.  Blood type is A positive.  Rh antibody screen negative. VDRL nonreactive.  Hepatitis B surface antigen negative.  HIV nonreactive. PPD negative per patient history in March 2002.  ASSESSMENT:                   Gross rupture of membrane at term.  PLAN:                         Admission to labor and delivery. Dictated by:   Guy Sandifer Arleta Creek, M.D. Attending:  Guy Sandifer Arleta Creek, M.D. DD:  10/03/01 TD:  10/03/01 Job: [id number]

## 2011-01-19 NOTE — Op Note (Signed)
[name] [name], [name] [name]           ACCOUNT NO.:  [id number]   MEDICAL RECORD NO.:  [id number]          PATIENT TYPE:  AMB   LOCATION:  DSC                          FACILITY:  [facility]   PHYSICIAN:  Harvie Junior, M.D.   DATE OF BIRTH:  08-Feb-1971   DATE OF PROCEDURE:  12/21/2005  DATE OF DISCHARGE:                                 OPERATIVE REPORT   PREOPERATIVE DIAGNOSES:  1.  Chondromalacia, patella.  2.  Medial plica.  3.  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   POSTOPERATIVE DIAGNOSES:  1.  Chondromalacia, patella.  2.  Medial plica.  3.  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  4.  Chondromalacia, medial femoral condyle.   OPERATION PERFORMED:  1.  Chondroplasty of medial femoral condyle and posterior surface of the      patella.  2.  Tight lateral retinacular release.  3.  Medial shelf plica excision.   SURGEON:  Harvie Junior, M.D.   ASSISTANT:  Marshia Ly, P. A.   ANESTHESIA:  General.   BRIEF HISTORY:  This is a 40 year old female with a long history of having  significant anterior knee pain.  We have treated her conservatively with  anti-inflammatory medications, injection therapy and activity modification.  Because of continued complaints of pain she will be taken to the operating  room for further evaluation and surgical intervention.   DESCRIPTION OF THE OPERATION:  The patient was brought to the operating  room.  After adequate anesthesia was obtained with a general anesthetic the  was placed on the operating table.  The right leg was prepped and draped in  the usual sterile fashion.  Following a routine arthroscopic examination of  the knee it was revealed there was an obvious chondromalacia on the  posterior surface of the patella.  Probing was used and showed that this was  really sort of a deep defect with loose pieces of cartilage.  At this point  the area was debrided with a suction shaver to a smooth and stable rim, and  certainly there was no bone  exposed.  The tracking was relatively midline  and, if not, super tight, but the lateral retinaculum was tight; and,  because of the chondromalacia of the patella as the primary diagnosis it was  felt that the lateral retinacular release was appropriate, and this was  performed on the lateral side.  Following this attention was turned of down  to the median compartment.   There was noted to be some chondromalacia of the medial femoral condyle.  The medial meniscus was within normal limits.  The chondromalacia of the  medial femoral condyle was debrided to a stable surface.  At this point the  lateral side was evaluated and noted to be normal.  Anterior cruciate  ligament was normal.  The knee was copiously irrigated and suctioned dry.   The arthroscopic portal was closed with bandage and a sterile compressive  dressing was applied.   The patient was taken to the recovery room ,where she was noted to be in  satisfactory condition.   ESTIMATED BLOOD LOSS:  The estimated blood loss for the  procedure was  __________ .      Harvie Junior, M.D.  Electronically Signed     JLG/MEDQ  D:  12/21/2005  T:  12/21/2005  Job:  [id number]

## 2011-01-19 NOTE — Op Note (Signed)
[place]  Patient:    [name] [name], [name] [name]                    MRN: [id number] Proc. Date: 01/01/01 Adm. Date:  [id number] Attending:  Milly Jakob                           Operative Report  PREOPERATIVE DIAGNOSIS:  Carpal tunnel syndrome, left wrist.  POSTOPERATIVE DIAGNOSIS:  Carpal tunnel syndrome, left wrist.  PROCEDURES: 1. Left carpal tunnel release. 2. Tenosynovectomy of flexor tendons.  SURGEON:  Harvie Junior, M.D.  ASSISTANT:  Currie Paris. Thedore Mins.  ANESTHESIA:  Forearm-based IV regional.  BRIEF HISTORY:  The patient is a 40 year old with a long history of having significant carpal tunnel syndrome in bilateral wrists.  She ultimately tried conservative care, which failed, and she is brought to the operating room for carpal tunnel release.  DESCRIPTION OF PROCEDURE:  Patient brought to the operating room.  After adequate anesthesia was obtained with a forearm-based IV regional, the patient was placed supine on the operating table.  The left arm was prepped and draped in the usual sterile fashion.  Following this, a curvilinear incision was made just ulnar to the midline wrist crease.  This was taken down to the level of the volar carpal fascia.  The volar carpal ligament was identified and carefully divided, then divided proximally and distally.  The median nerve was identified beneath.  The patient preoperatively had noted some significant swelling volar to the ligament and on the ulnar side, and when we identified the median nerve and carefully retracted it to the radial side, ultimately fairly dense tenosynovial inflammation was noted along the flexor tendons. This tissue was removed off of the flexor tendons and by way of a stripping procedure, this was done individually for each of the flexor tendons on the ulnar side of the median nerve.  This did decrease the bulk in the carpal tunnel quite a bit.  At this point the  wound was copiously irrigated and suctioned dry.  The wound was then closed with a combination of interrupted nylon sutures and a running suture.  A sterile compressive dressing was applied, and then she was taken to recovery, where she was noted to be in satisfactory condition.  Estimated blood loss for the procedure was none. DD:  01/01/01 TD:  01/02/01 Job: [id number]

## 2011-01-19 NOTE — H&P (Signed)
NAME:  [name] [name], [name] [name]                     ACCOUNT NO.:  [id number]   MEDICAL RECORD NO.:  [id number]                   PATIENT TYPE:  AMB   LOCATION:  SDC                                  [facility]ACILITY[facility]   PHYSICIAN:  Guy Sandifer. Arleta Creek, M.D.           DATE OF BIRTH:  Mar 01, 1971   DATE OF ADMISSION:  02/29/2004  DATE OF DISCHARGE:                                HISTORY & PHYSICAL   CHIEF COMPLAINT:  Missed abortion.   HISTORY OF PRESENT ILLNESS:  This patient is a 40 year old married white  female, G2, P41, with an LMP of December 01, 2003.  Prenatal care has been  complicated by initially a toxoplasmosis, IgG and IgM titer at [facility].  Parvovirus titers were negative for IgG and IgM on Jan 25, 2004.  Subsequent to that, repeat toxoplasmosis titers are positive for IgG  and negative for IgM at the [facility] in [facility], [place].  However, the patient was unknowingly exposed to parvovirus through her  neighborhood.  She subsequently developed myalgias, joint aches, and had a  conversion of her parvovirus titers to a positive IgM on February 08, 2004.  She  has been followed by Dr. Particia Nearing as well.   On February 28, 2004, she was noted to have a 12 to 13-week intrauterine  pregnancy with no heartbeat on prolonged observation.  Options of management  were discussed, and she is being admitted for dilatation and evacuation.  Potential risks and complications have been reviewed.  The patient also has  Laminaria placed in the office on the evening prior to surgery.   PAST MEDICAL HISTORY:  Irritable bowel syndrome.   PAST SURGICAL HISTORY:  1. Sinus surgery in 2005.  2. Cholecystectomy in 1996.  3. Abdominal hernia repair in 1991.   FAMILY HISTORY:  Coronary artery disease in maternal uncle, aortic disease  in maternal grandfather, chronic hypertension in mother and maternal  grandfather, anemia in mother, diabetes in maternal grandfather.   SOCIAL  HISTORY:  The patient denies tobacco, alcohol, or drug abuse.   MEDICATIONS:  Prenatal vitamins.   ALLERGIES:  No known drug allergies.   REVIEW OF SYSTEMS:  NEUROLOGIC:  Denies headache.  CARDIAC:  Denies chest  pain.  PULMONARY:  Denies shortness of breath.  MUSCULOSKELETAL:  Recent  myalgias and joint aches as above.   PHYSICAL EXAMINATION:  VITAL SIGNS:  Height 5 feet 4 inches, weight 193  pounds.  Blood pressure 102/64.  NECK:  Without thyromegaly.  LUNGS:  Clear to auscultation.  HEART:  Regular rate and rhythm.  BACK:  Without CVA tenderness.  BREASTS: Not examined.  ABDOMEN:  Soft, nontender, without masses.  PELVIC:  Exam deferred.  EXTREMITIES:  Grossly within normal limits.  NEUROLOGIC:  Grossly within normal limits.   LABORATORY DATA:  Blood type A positive. Rh antibody screen negative, RPR  nonreactive, rubella titer immune, hepatitis B surface antigen negative.  Cystic fibrosis  screen negative.   IMPRESSION:  Missed abortion at 13 weeks.   PLAN:  Dilatation and evacuation.                                               Guy Sandifer Arleta Creek, M.D.    JET/MEDQ  D:  02/28/2004  T:  02/28/2004  Job:  [id number]

## 2011-01-19 NOTE — Op Note (Signed)
NAMEDORINNE, GRAEFF           ACCOUNT NO.:  [id number]   MEDICAL RECORD NO.:  [id number]          PATIENT TYPE:  INP   LOCATION:  9127                          FACILITY:  [facility]   PHYSICIAN:  Guy Sandifer. Henderson Cloud, M.D. DATE OF BIRTH:  1971/01/31   DATE OF PROCEDURE:  03/27/2005  DATE OF DISCHARGE:                                 OPERATIVE REPORT   OPERATION/PROCEDURE:  Vacuum extraction.   INDICATIONS AND CONSENT:  This patient is a 40 year old, married white  female, G2, P1 at 38-4/7 weeks who has been pushing for approximately 45  minutes.  She is crowning with pushes.  There are decelerations to the 40's  with contractions.  In order to shorten the second stage, in view of the  deep decelerations, vacuum extraction was discussed with the patient and  husband. All questions are answered.  Foley catheter is in place and  removed.  Kiwi vacuum extractor is placed and suction is applied only with  the contractions to the green on the handle.  Over the course of one  contraction with no popoffs, the vertex is delivered.  The remainder of the  shoulder are delivered with patient effort.  No excessive traction is  applied to the baby.  Viable female infant, Apgars 9 and 9 at one and five  minutes respectively obtained.  Good cry and tone is noted.  Placenta is  three vessels and intact.  Vagina and cervix are without lacerations.  Second degree midline episiotomy is repaired.  Birth weight and arterial  cord PH are pending.  The patient and infant are stable in the labor and  delivery room.      Guy Sandifer Henderson Cloud, M.D.  Electronically Signed     JET/MEDQ  D:  03/27/2005  T:  03/27/2005  Job:  [id number]

## 2011-01-19 NOTE — H&P (Signed)
   [name] [name], [name] [name]                       ACCOUNT NO.:  [id number]   MEDICAL RECORD NO.:  [id number]                   PATIENT TYPE:  AMB   LOCATION:  SDC                                  FACILITY:  [facility]   PHYSICIAN:  Guy Sandifer. Arleta Creek, M.D.           DATE OF BIRTH:  05-23-1971   DATE OF ADMISSION:  DATE OF DISCHARGE:                                HISTORY & PHYSICAL   CHIEF COMPLAINT:  Irregular menses.   HISTORY OF PRESENT ILLNESS:  The patient is a 40 year old married white  female, Gravida I, Para I with a long history of irregular bleeding, on  multiple birth control pills. Pelvic ultrasound on November 26, 2002 revealed  the uterus measuring 8.5 x 5.3 x 3.2 cm. The ovaries were essentially  normal. Sono histogram is consistent with a 5.2 mm thickening on the  posterior of the endometrial canal, possibly consistent with a polypoid type  process. After discussion of the options, the patient is being admitted for  hysteroscopy and D&C. Potential risks and complications have been discussed  with the patient preoperatively.   PAST MEDICAL HISTORY:  Irritable bowel syndrome.   PAST SURGICAL HISTORY:  1. Cholecystectomy in 1996.  2. Ventral hernia repair in 1991.   OBSTETRIC HISTORY:  Vaginal delivery times one.   SOCIAL HISTORY:  The patient denies tobacco, alcohol, or drug abuse.   MEDICATIONS:  Claritin as needed and Yasmin daily.   ALLERGIES:  No known drug allergies.   FAMILY HISTORY:  Positive for coronary artery disease in a maternal uncle  and aortic disease in maternal grandfather. Chronic hypertension in mother  and maternal grandfather. Borderline hemoglobinopathy in mother. Diabetes  mellitus in maternal grandfather.   REVIEW OF SYSTEMS:  Neuro negative. Pulmonary negative. Cardiovascular  negative. Extremities negative.   PHYSICAL EXAMINATION:  VITAL SIGNS: Height 5'4. Weight 186 pounds. Blood  pressure 118/78.  HEENT: Without thyromegaly.  LUNGS:  Clear to auscultation.  HEART: Regular rate and rhythm.  BACK: Without tenderness.  BREAST: Without masses, retraction, or discharge.  ABDOMEN: Soft and nontender without masses.  PELVIC: Examination reveals vulva, vagina, and cervix without lesions.  Uterus normal size and nontender. Adnexa nontender without masses.  EXTREMITIES: Grossly within normal limits.  NEURO: Grossly within normal limits.   ASSESSMENT:  Irregular menses.   PLAN:  Hysteroscopy D&C.                                               Guy Sandifer Arleta Creek, M.D.    JET/MEDQ  D:  12/23/2002  T:  12/23/2002  Job:  [id number]

## 2011-01-19 NOTE — Op Note (Signed)
NAME:  [name] [name], [name] [name]                     ACCOUNT NO.:  [id number]   MEDICAL RECORD NO.:  [id number]                   PATIENT TYPE:  AMB   LOCATION:  SDC                                  FACILITY:  [facility]   PHYSICIAN:  Guy Sandifer. Arleta Creek, M.D.           DATE OF BIRTH:  [birth year]   DATE OF PROCEDURE:  02/29/2004  DATE OF DISCHARGE:                                 OPERATIVE REPORT   PREOPERATIVE DIAGNOSIS:  Spontaneous abortion at 23 weeks estimated  gestational age.   POSTOPERATIVE DIAGNOSIS:  Spontaneous abortion at 13 weeks estimated  gestational age.   PROCEDURES:  1. Dilatation and evacuation.  2. Paracervical block with 1% Xylocaine.   SURGEON:  Guy Sandifer. Henderson Cloud, M.D.   ANESTHESIA:  MAC.   ESTIMATED BLOOD LOSS:  50 mL.   SPECIMENS:  Products of conception.   INDICATIONS FOR PROCEDURE:  This patient has a known Parvovirus infection as  well as thickened nuchal lucency as discussed in the history and physical.  Spontaneous miscarriage was noted by Dr. Sherrie George on ultrasound on February 28, 2004.  Options were discussed and dilatation and evacuation was discussed.  Potential risks and complications have been discussed preoperatively  including, but not limited to, infection, uterine perforation, organ damage,  bleeding requiring transfusion of blood products and possible transfusion  reaction, HIV and hepatitis acquisition, DVT, PE, pneumonia, laparoscopy,  laparotomy, postoperative intrauterine synechia, and secondary infertility,  hysterectomy.  All questions have been answered and consent is signed on the  chart.  Tissue will also be sent for karyotype.   DESCRIPTION OF PROCEDURE:  The patient is taken to the operating room where  she is identified, placed in the dorsal supine position and intravenous  anesthetic is administered.  She was then placed in the dorsal lithotomy  position where she is prepped.  The two Laminaria that had been placed the  night before are  removed intact and she is straight catheterized and draped  in a sterile fashion.  Bivalve speculum is placed in the vagina.  The  anterior cervical lip is injected with 1% Xylocaine and grasped with single-  tooth tenaculum.  Paracervical block at the 2, 4, 5, 7, 8 and 10 o'clock  positions with approximately 20 mL total of 1% Xylocaine is placed.  The  cervix is gently and progressively dilated to a 35 Pratt dilator.  The #12  curved suction curette is placed in the intrauterine cavity and suction  curettage is carried out for obvious products of conception.  There were 20  units of Pitocin added to the liter of IV fluids after the initial pass of  the curette.  Alternating sharp and suction curettage is carried out until  the cavity is clean.  Gentle exploration with regular sized ring forceps is  also carried and is not productive of any additional  tissue.  After final pass of the sharp and suction curette.  Good hemostasis  is noted and the cavity feels clean.  Instruments are removed. Good  hemostasis is noted and all counts are correct.  The patient is awakened and  taken to the recovery room in stable condition.  Blood type is A positive.  A portion of the tissue will be sent for karyotype.                                               Guy Sandifer Arleta Creek, M.D.    JET/MEDQ  D:  02/29/2004  T:  02/29/2004  Job:  [id number]

## 2011-01-19 NOTE — Op Note (Signed)
[facility]  Patient:    [name] [name], [name] [name] Visit Number: [id number] MRN: [id number]          Service Type: OBS Location: 910A 9115 [id number] Attending Physician:  Soledad Gerlach Dictated by:   Guy Sandifer Arleta Creek, M.D. Proc. Date: 10/04/01 Admit Date:  10/03/2001                             Operative Report  PREOPERATIVE DIAGNOSIS:  POSTOPERATIVE DIAGNOSIS:  OPERATION:                    Vacuum extraction.  SURGEON:                      Guy Sandifer. Arleta Creek, M.D.  INDICATIONS AND CONSENT:      This patient is a 40 year old married white female, G1, P0, who is admitted to the hospital the a.m. of October 03, 2001, with spontaneous rupture of membranes for clear fluid.  She was placed on Pitocin and made slow, but steady progress to complete and pushing.  She has been pushing for approximately two hours.  She is very tired and request assistance.  The vertex is a +3 station.  I discussed with the patient and husband vacuum extraction.  Discussed 1 in 40,000 risks of serious morbidity and mortality.  All questions answered.  DESCRIPTION OF PROCEDURE:     The Foley catheter is removed.  A Kiwi vacuum extractor was placed on the vertex.  There is steady progress.  There are two pop-offs.  The vertex is then delivered and a nuchal cord x2 was reduced.  The shoulders were then delivered without difficulty.  A viable female infant, Apgars of 8 and 9 at one and five minutes respectively is obtained.  Arterial cord pH of 7.27 is noted.  Birth weight is pending.  Placenta is three vessels and intact.  Estimated blood loss is 500 cc.  Cervix and vagina are without laceration.  A second degree midline episiotomy was repaired.  The patient and infant are stable in labor and delivery. Dictated by:   Guy Sandifer Arleta Creek, M.D. Attending Physician:  Soledad Gerlach DD:  10/04/01 TD:  10/06/01 Job: [id number]

## 2011-01-19 NOTE — Op Note (Signed)
[place]  Patient:    [name] [name], [name] [name]                    MRN: [id number] Proc. Date: 01/17/01 Adm. Date:  [id number] Disc. Date: [id number] Attending:  Milly Jakob                           Operative Report  PREOPERATIVE DIAGNOSIS:  Carpal tunnel syndrome, right.  POSTOPERATIVE DIAGNOSIS:  Carpal tunnel syndrome, right.  PRINCIPAL PROCEDURE:  Right carpal tunnel release.  SURGEON:  Harvie Junior, M.D.  ASSISTANT:  Currie Paris. Thedore Mins.  ANESTHESIA:  ______  BRIEF HISTORY:  This is a 40 year old female with a long history of bilateral significant carpal tunnel syndrome.  She ultimately was taken to the operating room for release of her left carpal tunnel.  She did well with that at two weeks.  She came back for release of her right carpal tunnel.  She was brought to the operating room and wishes the procedure.  PROCEDURE:  The patient was brought to the operating room.  After adequate anesthesia was obtained with ______, the patient was placed on the operating table.  The right arm was then prepped and draped in the usual sterile fashion.  Following this, a curved incision was made in the ulnar portion of the wrist crease. The subcutaneous tissue was dissected down to the level of the volar carpal ligament, which was clearly identified and divided.  Care was taken to divide the carpal ligament to just barely peak through.  A bur was then used to make sure the nerve was then adherent superior and inferior to the carpal ligament.  At this point, the carpal ligament was divided proximally and distally.  A slightly longer incision had been made on this side than the opposite side so that we could do a tenosynovectomy where necessary as it was on the opposite side.  However, on this side, the tendon synovium was not inflamed and did not appear that resection of any tenosynovium would be beneficial to the patient.  At this point, the  wound was copiously irrigated and suctioned dry.  The wound was then closed with a combination of interrupted and running suture.  A sterile compressive dressing was applied, as well as a volar plaster splint.  The patient was taken to the recovery room and was found to be in satisfactory condition.  The estimated blood loss during the procedure was ______ DD:  01/17/01 TD:  01/19/01 Job: [id number]

## 2011-01-19 NOTE — Op Note (Signed)
NAMEDEA, BITTING                       ACCOUNT NO.:  [id number]   MEDICAL RECORD NO.:  [id number]                   PATIENT TYPE:  AMB   LOCATION:  SDC                                  FACILITY:  [facility]   PHYSICIAN:  Guy Sandifer. Arleta Creek, M.D.           DATE OF BIRTH:  [birth year]   DATE OF PROCEDURE:  12/31/2002  DATE OF DISCHARGE:                                 OPERATIVE REPORT   PREOPERATIVE DIAGNOSIS:  Irregular menses.   POSTOPERATIVE DIAGNOSIS:  Irregular menses.   PROCEDURE:  Hysteroscopy, dilatation and curettage.   SURGEON:  Guy Sandifer. Henderson Cloud, M.D.   ANESTHESIA:  1. MAC.  2. 1% Xylocaine paracervical block.   ESTIMATED BLOOD LOSS:  Less than 50 mL.   NOTE:  I&O of sorbitol distending medium are 50 mL.   SPECIMENS:  Endometrial curettings.   INDICATIONS AND CONSENTS:  The patient is a 40 year old married white  female, G1, P1, with a long history of irregular bleeding.  Details are  dictated in the history and physical.  Hysteroscopy, D&C is discussed with  the patient.  The potential risks and complications have been discussed  preoperatively, including but not limited to infection, uterine perforation,  bowel and bladder and ureteral damage, bleeding requiring transfusion of  blood products and possible transfusion reaction, HIV, and hepatitis  acquisition, DVT, PE, pneumonia, laparoscopy, laparotomy, hysterectomy, and  recurrent irregular bleeding.  All questions have been answered and consent  is signed on the chart.   FINDINGS:  The endometrial canal is without abnormal structure or blood  vessel.   DESCRIPTION OF PROCEDURE:  The patient was taken to the operating room and  placed in the dorsal supine position, where MAC anesthetic is administered.  She has been placed in the dorsal lithotomy position where she is prepped,  the bladder is straight-catheterized, and she is draped in a sterile  fashion.  A bivalve speculum is placed in the vagina and the  anterior  cervical lip is injected with 1% Xylocaine.  Anterior cervical lip is then  grasped with a single-tooth tenaculum.  Paracervical block is then placed at  the 2, 4, 5, 7, 8, and 10 o'clock positions with approximately 20 mL total  of 1% Xylocaine plain.  The cervix is gently progressively dilated with a  Pratt dilator.  A diagnostic hysteroscope is placed in the cervix and  advanced under direct visualization using sorbitol distending  medium.  The above findings are noted.  Hysteroscope is withdrawn and sharp  curettage is carried out.  Good hemostasis is noted.  Instruments are  removed, all counts correct.  The patient did receive IV antibiotics.  She  is transferred to the recovery room in stable condition.  Guy Sandifer Arleta Creek, M.D.    JET/MEDQ  D:  12/31/2002  T:  12/31/2002  Job:  [id number]

## 2011-06-13 LAB — CBC
HCT: 38.6
Hemoglobin: 11.8 — ABNORMAL LOW
MCHC: 34.4
MCHC: 34.9
MCV: 90.2
MCV: 90.8
Platelets: 218
RBC: 3.77 — ABNORMAL LOW
RDW: 13.7

## 2011-06-13 LAB — RAPID HIV SCREEN (WH-MAU): Rapid HIV Screen: NONREACTIVE

## 2012-01-19 ENCOUNTER — Other Ambulatory Visit: Payer: Self-pay

## 2012-01-19 ENCOUNTER — Emergency Department (HOSPITAL_COMMUNITY): Payer: BC Managed Care – PPO

## 2012-01-19 ENCOUNTER — Inpatient Hospital Stay (HOSPITAL_COMMUNITY)
Admission: EM | Admit: 2012-01-19 | Discharge: 2012-01-22 | DRG: 014 | Disposition: A | Discharge status: Home / Self Care | Payer: BC Managed Care – PPO | Attending: Internal Medicine | Admitting: Internal Medicine

## 2012-01-19 ENCOUNTER — Encounter (HOSPITAL_COMMUNITY): Payer: Self-pay | Admitting: *Deleted

## 2012-01-19 DIAGNOSIS — G8929 Other chronic pain: Secondary | ICD-10-CM

## 2012-01-19 DIAGNOSIS — E785 Hyperlipidemia, unspecified: Secondary | ICD-10-CM | POA: Diagnosis present

## 2012-01-19 DIAGNOSIS — E669 Obesity, unspecified: Secondary | ICD-10-CM | POA: Diagnosis present

## 2012-01-19 DIAGNOSIS — F419 Anxiety disorder, unspecified: Secondary | ICD-10-CM

## 2012-01-19 DIAGNOSIS — I498 Other specified cardiac arrhythmias: Secondary | ICD-10-CM | POA: Diagnosis present

## 2012-01-19 DIAGNOSIS — I639 Cerebral infarction, unspecified: Secondary | ICD-10-CM

## 2012-01-19 DIAGNOSIS — M1990 Unspecified osteoarthritis, unspecified site: Secondary | ICD-10-CM | POA: Diagnosis present

## 2012-01-19 DIAGNOSIS — Z87891 Personal history of nicotine dependence: Secondary | ICD-10-CM

## 2012-01-19 DIAGNOSIS — I10 Essential (primary) hypertension: Secondary | ICD-10-CM | POA: Diagnosis present

## 2012-01-19 DIAGNOSIS — F411 Generalized anxiety disorder: Secondary | ICD-10-CM | POA: Diagnosis present

## 2012-01-19 DIAGNOSIS — I6350 Cerebral infarction due to unspecified occlusion or stenosis of unspecified cerebral artery: Principal | ICD-10-CM

## 2012-01-19 DIAGNOSIS — F909 Attention-deficit hyperactivity disorder, unspecified type: Secondary | ICD-10-CM | POA: Diagnosis present

## 2012-01-19 DIAGNOSIS — Z6836 Body mass index (BMI) 36.0-36.9, adult: Secondary | ICD-10-CM

## 2012-01-19 DIAGNOSIS — I6340 Cerebral infarction due to embolism of unspecified cerebral artery: Secondary | ICD-10-CM

## 2012-01-19 DIAGNOSIS — Z79899 Other long term (current) drug therapy: Secondary | ICD-10-CM

## 2012-01-19 HISTORY — DX: Cerebral infarction, unspecified: I63.9

## 2012-01-19 HISTORY — DX: Anxiety disorder, unspecified: F41.9

## 2012-01-19 HISTORY — DX: Atherosclerotic heart disease of native coronary artery without angina pectoris: I25.10

## 2012-01-19 HISTORY — DX: Essential (primary) hypertension: I10

## 2012-01-19 LAB — RAPID URINE DRUG SCREEN, HOSP PERFORMED
Amphetamines: NOT DETECTED
Barbiturates: NOT DETECTED
Tetrahydrocannabinol: NOT DETECTED

## 2012-01-19 LAB — COMPREHENSIVE METABOLIC PANEL
Alkaline Phosphatase: 68 U/L (ref 39–117)
BUN: 10 mg/dL (ref 6–23)
Chloride: 102 mEq/L (ref 96–112)
Creatinine, Ser: 0.66 mg/dL (ref 0.50–1.10)
GFR calc Af Amer: >90 mL/min (ref >90)
Glucose, Bld: 120 mg/dL — ABNORMAL HIGH (ref 70–99)
Potassium: 3.9 mEq/L (ref 3.5–5.1)
Total Bilirubin: 0.2 mg/dL — ABNORMAL LOW (ref 0.3–1.2)

## 2012-01-19 LAB — CBC
HCT: 38.7 % (ref 36.0–46.0)
Hemoglobin: 13.4 g/dL (ref 12.0–15.0)
MCH: 32.4 pg (ref 26.0–34.0)
RBC: 4.13 MIL/uL (ref 3.87–5.11)

## 2012-01-19 LAB — PROTIME-INR: INR: 0.96 (ref 0.00–1.49)

## 2012-01-19 LAB — DIFFERENTIAL
Eosinophils Absolute: 0 10*3/uL (ref 0.0–0.7)
Lymphs Abs: 1.6 10*3/uL (ref 0.7–4.0)
Monocytes Absolute: 0.9 10*3/uL (ref 0.1–1.0)
Monocytes Relative: 7 % (ref 3–12)
Neutro Abs: 11.3 10*3/uL — ABNORMAL HIGH (ref 1.7–7.7)
Neutrophils Relative %: 82 % — ABNORMAL HIGH (ref 43–77)

## 2012-01-19 LAB — POCT I-STAT, CHEM 8
HCT: 39 % (ref 36.0–46.0)
Hemoglobin: 13.3 g/dL (ref 12.0–15.0)
Potassium: 3.8 mEq/L (ref 3.5–5.1)
Sodium: 140 mEq/L (ref 135–145)

## 2012-01-19 LAB — CK TOTAL AND CKMB (NOT AT ARMC): Total CK: 208 U/L — ABNORMAL HIGH (ref 7–177)

## 2012-01-19 LAB — GLUCOSE, CAPILLARY: Glucose-Capillary: 129 mg/dL — ABNORMAL HIGH (ref 70–99)

## 2012-01-19 MED ORDER — METHYLPHENIDATE HCL 5 MG PO TABS
20.0000 mg | ORAL_TABLET | Freq: Every day | ORAL | Status: DC
  Filled 2012-01-19 (×2): qty 4

## 2012-01-19 MED ORDER — TRAMADOL HCL ER 100 MG PO TB24: 100.0000 mg | ORAL_TABLET | Freq: Every day | ORAL | Status: DC | PRN

## 2012-01-19 MED ORDER — ACETAMINOPHEN 650 MG RE SUPP: 650.0000 mg | RECTAL | Status: DC | PRN

## 2012-01-19 MED ORDER — ENOXAPARIN SODIUM 40 MG/0.4ML ~~LOC~~ SOLN
40.0000 mg | SUBCUTANEOUS | Status: DC
  Administered 2012-01-19 – 2012-01-21 (×3): 40 mg via SUBCUTANEOUS
  Filled 2012-01-19 (×4): qty 0.4

## 2012-01-19 MED ORDER — TRAMADOL HCL 50 MG PO TABS
50.0000 mg | ORAL_TABLET | Freq: Two times a day (BID) | ORAL | Status: DC | PRN
  Administered 2012-01-21: 50 mg via ORAL
  Filled 2012-01-19: qty 1

## 2012-01-19 MED ORDER — ESCITALOPRAM OXALATE 20 MG PO TABS
20.0000 mg | ORAL_TABLET | Freq: Every day | ORAL | Status: DC
  Administered 2012-01-19 – 2012-01-22 (×4): 20 mg via ORAL
  Filled 2012-01-19 (×4): qty 1

## 2012-01-19 MED ORDER — ACETAMINOPHEN 325 MG PO TABS: 650.0000 mg | ORAL_TABLET | ORAL | Status: DC | PRN

## 2012-01-19 MED ORDER — ASPIRIN 325 MG PO TABS
325.0000 mg | ORAL_TABLET | Freq: Every day | ORAL | Status: DC
  Administered 2012-01-19 – 2012-01-21 (×3): 325 mg via ORAL
  Filled 2012-01-19 (×5): qty 1

## 2012-01-19 MED ORDER — SODIUM CHLORIDE 0.9 % IJ SOLN
3.0000 mL | Freq: Two times a day (BID) | INTRAMUSCULAR | Status: DC
  Administered 2012-01-19 – 2012-01-22 (×6): 3 mL via INTRAVENOUS

## 2012-01-19 MED ORDER — SODIUM CHLORIDE 0.9 % IJ SOLN: 3.0000 mL | INTRAMUSCULAR | Status: DC | PRN

## 2012-01-19 MED ORDER — ASPIRIN 300 MG RE SUPP
300.0000 mg | Freq: Every day | RECTAL | Status: DC
  Filled 2012-01-19 (×3): qty 1

## 2012-01-19 MED ORDER — LOPERAMIDE HCL 2 MG PO CAPS
2.0000 mg | ORAL_CAPSULE | ORAL | Status: DC | PRN
  Filled 2012-01-19: qty 1

## 2012-01-19 MED ORDER — NAPROXEN 500 MG PO TABS
500.0000 mg | ORAL_TABLET | Freq: Two times a day (BID) | ORAL | Status: DC
  Administered 2012-01-19 – 2012-01-21 (×4): 500 mg via ORAL
  Filled 2012-01-19 (×7): qty 1

## 2012-01-19 MED ORDER — SODIUM CHLORIDE 0.9 % IV SOLN
250.0000 mL | INTRAVENOUS | Status: DC | PRN
  Administered 2012-01-22: 500 mL via INTRAVENOUS

## 2012-01-19 MED ORDER — ONDANSETRON HCL 4 MG/2ML IJ SOLN: 4.0000 mg | Freq: Four times a day (QID) | INTRAMUSCULAR | Status: DC | PRN

## 2012-01-19 MED ORDER — ASPIRIN 325 MG PO TABS
325.0000 mg | ORAL_TABLET | Freq: Once | ORAL | Status: AC
  Administered 2012-01-19: 325 mg via ORAL
  Filled 2012-01-19: qty 1

## 2012-01-19 MED ORDER — SENNOSIDES-DOCUSATE SODIUM 8.6-50 MG PO TABS: 1.0000 | ORAL_TABLET | Freq: Every evening | ORAL | Status: DC | PRN

## 2012-01-19 NOTE — ED Notes (Signed)
Necklaces place in urine cup, labeled. Two bracelets sealed in biohazard bag, labeled. Pt's mother witnessed labeling and placement of jewelery in cup and bag. Pt's mother placed pt's jewelery in pt's purse (white/green)

## 2012-01-19 NOTE — ED Provider Notes (Signed)
History     CSN: [id number]  Arrival date & time 01/19/12  0754   First MD Initiated Contact with Patient 01/19/12 [phone]      Chief Complaint  Patient presents with  . Stroke Symptoms    left side    (Consider location/radiation/quality/duration/timing/severity/associated sxs/prior treatment) HPI  41 year old female presents with strokelike symptoms. Patient states that she has a knee tap to her left knee yesterday and was getting a cortisone shot to same knee. She also mentioned that she donated blood yesterday. Around 8 PM last night patient felt a sensation of heaviness to all 4 extremities. She talks to a paramedic friend to suggest that she took some Benadryl for possible allergic reaction. Patient states she took 50 mg of Benadryl and went to sleep. This morning when she woke up she experiencing some sensation of weakness left greater than right and difficulty talking. She denies headache, double vision, throat swelling, nausea, vomiting, chest pain, shortness of breath, abdominal pain, back pain, urinary symptoms, or rash. She denies tingling or numbness sensation. She denies history of prior stroke. She denies any recreational drug use.  Past Medical History  Diagnosis Date  . Hypertension     Past Surgical History  Procedure Date  . Cholecystectomy   . Hernia repair   . Knee surgery     right    No family history on file.  History  Substance Use Topics  . Smoking status: Never Smoker   . Smokeless tobacco: Not on file  . Alcohol Use: Yes     occ    OB History    Grav Para Term Preterm Abortions TAB SAB Ect Mult Living                  Review of Systems  All other systems reviewed and are negative.    Allergies  Review of patient's allergies indicates no known allergies.  Home Medications   Current Outpatient Rx  Name Route Sig Dispense Refill  . ESCITALOPRAM OXALATE 20 MG PO TABS Oral Take 20 mg by mouth daily.    Marland Kitchen LOPERAMIDE HCL 2 MG PO CAPS  Oral Take 2 mg by mouth daily as needed. For diarrhea    . METHYLPHENIDATE HCL 20 MG PO TABS Oral Take 20 mg by mouth daily.    Marland Kitchen METOPROLOL SUCCINATE ER 50 MG PO TB24 Oral Take 50 mg by mouth daily. Take with or immediately following a meal.    . NAPROXEN 500 MG PO TABS Oral Take 500 mg by mouth every 12 (twelve) hours.    . TRAMADOL HCL ER 100 MG PO TB24 Oral Take 100 mg by mouth daily as needed. For pain      BP 142/90  Pulse 79  Temp(Src) 98.5 F (36.9 C) (Oral)  Resp 16  SpO2 94%  LMP 01/07/2012  Physical Exam  Nursing note and vitals reviewed. Constitutional: She is oriented to person, place, and time. She appears well-developed and well-nourished. No distress.       Awake, alert, nontoxic appearance  HENT:  Head: Normocephalic and atraumatic.  Mouth/Throat: Uvula is midline and oropharynx is clear and moist. No oropharyngeal exudate.       No tongue swelling or signs of air way obstruction  Eyes: Conjunctivae and EOM are normal. Pupils are equal, round, and reactive to light. Right eye exhibits no discharge. Left eye exhibits no discharge.  Neck: Normal range of motion. Neck supple.  Cardiovascular: Normal rate and regular rhythm.  Pulmonary/Chest: Effort normal. No respiratory distress. She exhibits no tenderness.  Abdominal: Soft. There is no tenderness. There is no rebound.  Musculoskeletal: She exhibits no tenderness.       ROM appears intact, no obvious focal weakness  Neurological: She is alert and oriented to person, place, and time. No cranial nerve deficit or sensory deficit. She exhibits abnormal muscle tone. She displays a negative Romberg sign. Coordination and gait abnormal. GCS eye subscore is 4. GCS verbal subscore is 5. GCS motor subscore is 5. She displays no Babinski's sign on the right side. She displays no Babinski's sign on the left side.  Reflex Scores:      Patellar reflexes are 3+ on the right side and 3+ on the left side.      Mental status appears  intact, L sided facial droop, L sided weakness most noticeable to upper extremity with decreased strength. Decreased coordination to L side, including finger to nose, heels to shin, and alternative hand movement. antalgic gait favoring R side.  Neg romberg.  Skin: Skin is warm. No rash noted.  Psychiatric: She has a normal mood and affect.    ED Course  Procedures (including critical care time)  Labs Reviewed  GLUCOSE, CAPILLARY - Abnormal; Notable for the following:    Glucose-Capillary 129 (*)    All other components within normal limits   No results found.   No diagnosis found.   Date: 01/19/2012  Rate: 59  Rhythm: normal sinus rhythm  QRS Axis: normal  Intervals: normal  ST/T Wave abnormalities: normal  Conduction Disutrbances:none  Narrative Interpretation:   Old EKG Reviewed: none available  Results for orders placed during the hospital encounter of 01/19/12  GLUCOSE, CAPILLARY      Component Value Range   Glucose-Capillary 129 (*) 70 - 99 (mg/dL)  PROTIME-INR      Component Value Range   Prothrombin Time 13.0  11.6 - 15.2 (seconds)   INR 0.96  0.00 - 1.49   APTT      Component Value Range   aPTT 23 (*) 24 - 37 (seconds)  CBC      Component Value Range   WBC 13.8 (*) 4.0 - 10.5 (K/uL)   RBC 4.13  3.87 - 5.11 (MIL/uL)   Hemoglobin 13.4  12.0 - 15.0 (g/dL)   HCT 40.9  81.1 - 91.4 (%)   MCV 93.7  78.0 - 100.0 (fL)   MCH 32.4  26.0 - 34.0 (pg)   MCHC 34.6  30.0 - 36.0 (g/dL)   RDW 78.2  95.6 - 21.3 (%)   Platelets 319  150 - 400 (K/uL)  DIFFERENTIAL      Component Value Range   Neutrophils Relative 82 (*) 43 - 77 (%)   Neutro Abs 11.3 (*) 1.7 - 7.7 (K/uL)   Lymphocytes Relative 12  12 - 46 (%)   Lymphs Abs 1.6  0.7 - 4.0 (K/uL)   Monocytes Relative 7  3 - 12 (%)   Monocytes Absolute 0.9  0.1 - 1.0 (K/uL)   Eosinophils Relative 0  0 - 5 (%)   Eosinophils Absolute 0.0  0.0 - 0.7 (K/uL)   Basophils Relative 0  0 - 1 (%)   Basophils Absolute 0.0  0.0 - 0.1  (K/uL)  COMPREHENSIVE METABOLIC PANEL      Component Value Range   Sodium 138  135 - 145 (mEq/L)   Potassium 3.9  3.5 - 5.1 (mEq/L)   Chloride 102  96 - 112 (mEq/L)  CO2 23  19 - 32 (mEq/L)   Glucose, Bld 120 (*) 70 - 99 (mg/dL)   BUN 10  6 - 23 (mg/dL)   Creatinine, Ser 0.98  0.50 - 1.10 (mg/dL)   Calcium 9.0  8.4 - 11.9 (mg/dL)   Total Protein 7.0  6.0 - 8.3 (g/dL)   Albumin 4.1  3.5 - 5.2 (g/dL)   AST 17  0 - 37 (U/L)   ALT 23  0 - 35 (U/L)   Alkaline Phosphatase 68  39 - 117 (U/L)   Total Bilirubin 0.2 (*) 0.3 - 1.2 (mg/dL)   GFR calc non Af Amer >90  >90 (mL/min)   GFR calc Af Amer >90  >90 (mL/min)  CK TOTAL AND CKMB      Component Value Range   Total CK 208 (*) 7 - 177 (U/L)   CK, MB 3.4  0.3 - 4.0 (ng/mL)   Relative Index 1.6  0.0 - 2.5   TROPONIN I      Component Value Range   Troponin I <0.30  <0.30 (ng/mL)  URINE RAPID DRUG SCREEN (HOSP PERFORMED)      Component Value Range   Opiates NONE DETECTED  NONE DETECTED    Cocaine NONE DETECTED  NONE DETECTED    Benzodiazepines NONE DETECTED  NONE DETECTED    Amphetamines NONE DETECTED  NONE DETECTED    Tetrahydrocannabinol NONE DETECTED  NONE DETECTED    Barbiturates NONE DETECTED  NONE DETECTED   POCT I-STAT, CHEM 8      Component Value Range   Sodium 140  135 - 145 (mEq/L)   Potassium 3.8  3.5 - 5.1 (mEq/L)   Chloride 107  96 - 112 (mEq/L)   BUN 10  6 - 23 (mg/dL)   Creatinine, Ser 1.47  0.50 - 1.10 (mg/dL)   Glucose, Bld 829 (*) 70 - 99 (mg/dL)   Calcium, Ion 5.62 (*) 1.12 - 1.32 (mmol/L)   TCO2 24  0 - 100 (mmol/L)   Hemoglobin 13.3  12.0 - 15.0 (g/dL)   HCT 13.0  86.5 - 78.4 (%)   Ct Head Wo Contrast  01/19/2012  *RADIOLOGY REPORT*  Clinical Data: Heaviness in both arms and legs.  Possible stroke like symptoms.  CT HEAD WITHOUT CONTRAST  Technique:  Contiguous axial images were obtained from the base of the skull through the vertex without contrast.  Comparison: No priors.  Findings: There is a well-defined  focus of low attenuation in the right putamen, compatible with an old lacunar infarction.  No definite acute intracranial abnormalities.  Specifically, no definitive evidence to suggest acute/subacute cerebral ischemia, no hemorrhage, no focal mass, mass effect, hydrocephalus or abnormal intra or extra-axial fluid collections.  No acute displaced skull fractures are identified.  Visualized paranasal sinuses and mastoids are well pneumatized, with exception of complete opacification of the left frontoethmoidal recess.  Postoperative changes of bilateral maxillary antrectomy are noted.  IMPRESSION: 1.  No acute intracranial abnormalities. 2.  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 3.  Status post bilateral maxillary antrectomy.  There is complete opacification of the left frontoethmoidal recess, which may represent a small mucocele.  Original Report Authenticated By: Florencia Reasons, M.D.   Mr Maxine Glenn Head Wo Contrast  01/19/2012  *RADIOLOGY REPORT*  Clinical Data:  Left-sided weakness.  MRI HEAD WITHOUT CONTRAST MRA HEAD WITHOUT CONTRAST  Technique:  Multiplanar, multiecho pulse sequences of the brain and surrounding structures were obtained without intravenous contrast. Angiographic images  of the head were obtained using MRA technique without contrast.  Comparison:  CT head 01/19/2012  MRI HEAD  Findings:  Acute infarct involving the right basal ganglia.  This is primarily in the putamen extending into the tail of the caudate. There may also be some involvement of the posterior limb internal capsule.  No other acute infarct.  No significant chronic ischemic changes.  Negative for hemorrhage or mass.  Ventricle size is normal.  Brainstem is normal.  IMPRESSION: Acute infarct right basal ganglia.  MRA HEAD  Findings: Left vertebral artery is patent to the basilar.  Right vertebral artery is hypoplastic after supplying pica.  Basilar is widely patent.  Superior cerebellar and posterior cerebral  arteries are patent.  Prominent left AICA is present supplying the  AICA and PICA territory.  Internal carotid artery is patent bilaterally without stenosis. Anterior and middle cerebral arteries are patent bilaterally. Negative for cerebral aneurysm.  IMPRESSION: Negative  Original Report Authenticated By: Camelia Phenes, M.D.   Mr Brain Wo Contrast  01/19/2012  *RADIOLOGY REPORT*  Clinical Data:  Left-sided weakness.  MRI HEAD WITHOUT CONTRAST MRA HEAD WITHOUT CONTRAST  Technique:  Multiplanar, multiecho pulse sequences of the brain and surrounding structures were obtained without intravenous contrast. Angiographic images of the head were obtained using MRA technique without contrast.  Comparison:  CT head 01/19/2012  MRI HEAD  Findings:  Acute infarct involving the right basal ganglia.  This is primarily in the putamen extending into the tail of the caudate. There may also be some involvement of the posterior limb internal capsule.  No other acute infarct.  No significant chronic ischemic changes.  Negative for hemorrhage or mass.  Ventricle size is normal.  Brainstem is normal.  IMPRESSION: Acute infarct right basal ganglia.  MRA HEAD  Findings: Left vertebral artery is patent to the basilar.  Right vertebral artery is hypoplastic after supplying pica.  Basilar is widely patent.  Superior cerebellar and posterior cerebral arteries are patent.  Prominent left AICA is present supplying the  AICA and PICA territory.  Internal carotid artery is patent bilaterally without stenosis. Anterior and middle cerebral arteries are patent bilaterally. Negative for cerebral aneurysm.  IMPRESSION: Negative  Original Report Authenticated By: Camelia Phenes, M.D.      MDM  L sided weakness, no headache, last normal 12 hrs ago.  Code stroke initiated.  Discussed with my attending.    DDx: stroke, electrolytes abnormality, complicated migraine, psychosomatic conversion disorder.     1:12 PM MR of brain shows evidence  of an acute infarct to Right basal ganglia.  Will consult with neurology.    1:57 PM Neurology has been consult, and agrees to see pt in ED.  Pt currently stable.  VSS.  In no distress.  Result discussed with patient, who voice understanding.    3:30 PM Neurology has not seen pt yet.  I have discussed pt care with Tatyana K. PA-C who will continue to monitor pt in CDU and will work on admission dispo.  Pt currently stable.    Fayrene Helper, PA-C 01/19/12 [phone]

## 2012-01-19 NOTE — ED Provider Notes (Signed)
Medical screening examination/treatment/procedure(s) were conducted as a shared visit with non-physician practitioner(s) and myself.  I personally evaluated the patient during the encounter   Patient with a gradual onset of progressive disability, culminating in dysarthria, and left arm and left leg weakness. Her symptoms started yesterday afternoon, when she had general tiredness. This followed getting a left knee drainage for fluid and injection of cortisone. She began to have bilateral arm and leg weakness, but did not impair her ability to move. She went to sleep and awoke during the night to urinate, feeling better. She thinks this was around 2 AM. Later she woke at 7 AM and felt like her bilateral arm and leg weakness had returned. She decided to come to the emergency room so called a friend to bring her. During the travel time. She felt like the weakness localized to the left side. The right-sided weakness. On exam; she has mild dysarthria, and mild, left facial weakness. Strength testing of the legs; 5 out of 5 on right 4/5 on left. Arms; 5 out of 5 on right 4/5 left. No pronator drift. She is clumsy on finger to nose, and heel-to-shin on left, not on the right.    Flint Melter, MD 01/20/12 [phone]

## 2012-01-19 NOTE — ED Provider Notes (Signed)
Pt with acute stroke, signed out to me by PA Laveda Norman. Pt with left sided weakness onset when woke up. MRI shows acute stroke. It has been several hours and pt has not been seen by neurologist yet. I paged them again, they will be seeing pt next asked to admit pt to medicine.   5:32 PM I spoke with teaching service, unassigned. Pt's PCP is Dr. Riley Nearing in [facility]. WIll admit.   Lottie Mussel, PA 01/19/12 1732

## 2012-01-19 NOTE — Consult Note (Signed)
Referring Physician:     Chief Complaint: Left sided weakness.   HPI: [name] [name] is an 41 y.o. female who presents to [facility] with left sided numbness and weakness that started yesterday afternoon. Symptoms began with a feeling of general fatigue, with bilateral arm and leg weakness. She had donated blood and had also been injected with cortisone in conjunction with a left knee drainage. ast night. She went to sleep and upon awakening in the early AM hours she felt better. She went back to sleep and upon reawakening at 7 AM, her bilateral upper and lower extremity weakness had returned. She was taken to the ER, and on the way she began feeling that the weakness was now greater on the left. ER physicians noted mild dystaxia on the left, mild dysarthria and mild left facial weakness.   She has no prior history of stroke or MI, no history of clotting disorder, multiple miscarriages or DVT. She also has no significant family history for such. She does not take oral contraceptives, nor does she smoke. She does have a history of hypertension.    Past Medical History  Diagnosis Date  . Hypertension     Past Surgical History  Procedure Date  . Cholecystectomy   . Hernia repair   . Knee surgery     right    No family history on file. Social History:  reports that she has never smoked. She does not have any smokeless tobacco history on file. She reports that she drinks alcohol. She reports that she does not use illicit drugs.  Allergies: No Known Allergies  Medications: Scheduled:   . aspirin  325 mg Oral Once    ROS: No fever, chills, nausea, vomiting, diarrhea, urinary incontinence, cough, chest pain or limb pain.   Physical Examination: Blood pressure 123/75, pulse 63, temperature 98.8 F (37.1 C), temperature source Oral, resp. rate 20, last menstrual period 01/11/2012, SpO2 97.00%.  Neurologic Examination: Mental Status: Alert, oriented, thought content  appropriate.  Speech fluent without evidence of aphasia.  No dysarthria. Able to follow all commands normally.  Cranial Nerves: II- Visual fields grossly intact. III/IV/VI-Extraocular movements intact. No nystagmus. Pupils reactive bilaterally. V/VII-subtle intermittent lower left quadrant facial droop VIII-grossly intact to conversation X-phonation intact XI-bilateral shoulder shrug 5/5 XII-midline tongue extension Motor: 5/5 bilaterally with normal tone and bulk.  Mild left pronator drift. Positive "log roll sign" on left. Sensory: Light touch and temperature intact bilaterally in upper and lower extremities, except absent in a patch about 7 x 15 mm in dimension along the lateral aspect of the right lower leg just below the knee due to prior surgical nerve release. Deep Tendon Reflexes: 2+ ankles bilaterally; remaining reflexes are brisk, nonpathological 3+ throughout. Plantars downgoing on the right, upgoing on the left. Positive Hoffman sign on the left.  Cerebellar: Normal finger-to-nose on the right, mild dysmetria on left, normal heel-to-shin test bilaterally.     Basic Metabolic Panel:  Lab 01/19/12 0454 01/19/12 0848  NA 140 138  K 3.8 3.9  CL 107 102  CO2 -- 23  GLUCOSE 123* 120*  BUN 10 10  CREATININE 0.70 0.66  CALCIUM -- 9.0  MG -- --  PHOS -- --   Liver Function Tests:  Lab 01/19/12 0848  AST 17  ALT 23  ALKPHOS 68  BILITOT 0.2*  PROT 7.0  ALBUMIN 4.1   CBC:  Lab 01/19/12 0946 01/19/12 0848  WBC -- 13.8*  NEUTROABS -- 11.3*  HGB  13.3 13.4  HCT 39.0 38.7  MCV -- 93.7  PLT -- 319   Cardiac Enzymes:  Lab 01/19/12 0848  CKTOTAL 208*  CKMB 3.4  CKMBINDEX --  TROPONINI <0.30   CBG:  Lab 01/19/12 0801  GLUCAP 129*   Coagulation:  Lab 01/19/12 0848  LABPROT 13.0  INR 0.96   Urine Drug Screen: Drugs of Abuse     Component Value Date/Time   LABOPIA NONE DETECTED 01/19/2012 0953   COCAINSCRNUR NONE DETECTED 01/19/2012 0953   LABBENZ NONE  DETECTED 01/19/2012 0953   AMPHETMU NONE DETECTED 01/19/2012 0953   THCU NONE DETECTED 01/19/2012 0953   LABBARB NONE DETECTED 01/19/2012 0953    01/19/2012 CT HEAD WITHOUT CONTRAST  Technique:  Contiguous axial images were obtained from the base of the skull through the vertex without contrast.  Comparison: No priors.  Findings: There is a well-defined focus of low attenuation in the right putamen, compatible with an old lacunar infarction.  No definite acute intracranial abnormalities.  Specifically, no definitive evidence to suggest acute/subacute cerebral ischemia, no hemorrhage, no focal mass, mass effect, hydrocephalus or abnormal intra or extra-axial fluid collections.  No acute displaced skull fractures are identified.  Visualized paranasal sinuses and mastoids are well pneumatized, with exception of complete opacification of the left frontoethmoidal recess.  Postoperative changes of bilateral maxillary antrectomy are noted.  IMPRESSION: 1.  No acute intracranial abnormalities. 2.  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 3.  Status post bilateral maxillary antrectomy.  There is complete opacification of the left frontoethmoidal recess, which may represent a small mucocele. Florencia Reasons, M.D.   01/19/2012  MRI HEAD  Findings:  Acute infarct involving the right basal ganglia.  This is primarily in the putamen extending into the tail of the caudate. There may also be some involvement of the posterior limb internal capsule.  No other acute infarct.  No significant chronic ischemic changes.  Negative for hemorrhage or mass.  Ventricle size is normal.  Brainstem is normal.  IMPRESSION: Acute infarct right basal ganglia. Camelia Phenes, M.D.   01/19/12 MRA HEAD  Findings: Left vertebral artery is patent to the basilar.  Right vertebral artery is hypoplastic after supplying pica.  Basilar is widely patent.  Superior cerebellar and posterior cerebral arteries are patent.  Prominent left  AICA is present supplying the  AICA and PICA territory.  Internal carotid artery is patent bilaterally without stenosis. Anterior and middle cerebral arteries are patent bilaterally. Negative for cerebral aneurysm.  IMPRESSION: Negative   Camelia Phenes, M.D.    Assessment: 41 y.o. female with subacute right basal ganglia infarction. DDx includes cardioembolic, artery-to-artery embolism, in situ thrombosis, paradoxical embolization, vasculitis, dissection, hypercoagulable state.   Stroke Risk Factors - HTN  Plan: 1. HgbA1c, fasting lipid panel 2. MRI and MRA  of the brain completed. Add MRA of neck to studies.  3. PT consult, OT consult, Speech consult 4. Transthoracic echocardiogram with bubble study. If negative, will need TEE. 5. Carotid dopplers 6. Prophylactic therapy with ASA. Will add statin after obtaining fasting lipid panel. 7. Risk factor modification 8. Telemetry monitoring 9. Hypercoagulable work up. Include D-dimer, ESR, ANA and C-reactive protein.    Marya Fossa PA-C [facility] [phone] 01/19/2012, 5:18 PM  Electronically signed: Dr. Caryl Pina

## 2012-01-19 NOTE — ED Notes (Signed)
Pt sts that she had fluid drawn off left knee yesterday and was given a cortisone shot.  Pt also gave blood yesterday.  Pt called out EMS last nite for heaviness in both arms and legs around 8pm.  They thought she had allergic reaction and she took benadryl and went to bed around 9pm.  She did get up some time during nite and thought she was better because she could walk.  Pt woke up this am with left arm and leg weakness, left facial droop, and thick speech.  CBG 129 at triage.  Pt is treated for high bp

## 2012-01-19 NOTE — ED Notes (Signed)
Patient settled into room 32 on yellow, patient in NAD at this time, patient awaiting neurology consult at this time

## 2012-01-19 NOTE — H&P (Signed)
Hospital Admission Note Date: 01/19/2012  Patient name: [name] [name] Medical record number: [id number] Date of birth: November 17, 1970 Age: 41 y.o. Gender: female PCP: Dr. Riley Nearing at [facility]  Medical Service:  Mervyn Gay  Attending physician:   Dr. Blanch Media  1st Contact:    Dr. Yaakov Guthrie Pager: [phone] 2nd Contact:    Dr. Allena Katz Pager: [phone] After 5 pm or weekends: 1st Contact:      Pager: [phone] 2nd Contact:      Pager: [phone]  Chief Complaint: Weakness of all 4 extremities, Left > Right  History of Present Illness:  [name] [name] is a 41 year old woman with hypertension, anxiety, and tobacco use in the distant past who presented to the hospital this morning due to weakness and heaviness in all four extremities, L side > R side.  She first noticed trouble last night at 8pm.  Both arms and legs felt heavy and warm.  She could move her fingers and toes, but she could not lift her arms in bed and she could move her legs well enough to get them over the side of the bed.  She called her paramedic friend who found stable vital signs and felt it was likely an allergic reaction.  She took 50mg  Benadryl and went to sleep.  She awoke in the early morning and felt stronger but still not herself.  She was able to walk and use the bathroom and then went back to sleep.  This morning, she woke up with weakness worse than in the early morning, but not as bad as it was at its worst ~8pm last night.  This morning her weakness is much worse in left arm and leg than on right side.  She also reports some tongue heaviness and her husband said her voice sounds a bit different.  All of these symptoms have partially resolved since her arrival at the ED, but all still remain to some degree.    She has at no point had any decreased sensation anywhere, dizzyness, or headache.  No visual field deficits, no vision changes.    No leg pain or swelling, no recent car rides.  No family history  of autoimmune disease, strokes, or blood clots.  She smoked in past but quit 10 yrs ago.    No dysuria, hematuria, pain anywhere, chest pain,  SOB, diarrhea, constipation, nausea, or vomiting.    Meds: Medication List  As of 01/19/2012  6:08 PM   ASK your doctor about these medications         escitalopram 20 MG tablet   Commonly known as: LEXAPRO   Take 20 mg by mouth daily.      loperamide 2 MG capsule   Commonly known as: IMODIUM   Take 2 mg by mouth daily as needed. For diarrhea      methylphenidate 20 MG tablet   Commonly known as: RITALIN   Take 20 mg by mouth daily.      metoprolol succinate 50 MG 24 hr tablet   Commonly known as: TOPROL-XL   Take 50 mg by mouth daily. Take with or immediately following a meal.      naproxen 500 MG tablet   Commonly known as: NAPROSYN   Take 500 mg by mouth every 12 (twelve) hours.      traMADol 100 MG 24 hr tablet   Commonly known as: ULTRAM-ER   Take 100 mg by mouth daily as needed. For pain  Allergies: Allergies as of 01/19/2012  . (No Known Allergies)   Past Medical History  Diagnosis Date  . Hypertension   . Anxiety    Past Surgical History  Procedure Date  . Cholecystectomy   . Hernia repair   . Knee surgery     right  . Carpal tunnel release     B/L hand   No family history on file. History   Social History  . Marital Status: Married    Spouse Name: N/A    Number of Children: N/A  . Years of Education: N/A   Occupational History  . Not on file.   Social History Main Topics  . Smoking status: Former Games developer  . Smokeless tobacco: Not on file   Comment: quit 10 years ago, smoked intermittently for few years. Less than 10 yrs total.  . Alcohol Use: Yes     occ- 1-2 drinks a week  . Drug Use: No  . Sexually Active: Not on file   Other Topics Concern  . Not on file   Social History Narrative   Lives in [place] with her husband.Has 4 healthy kids.    Review of Systems: See  HPI.  Physical Exam: Blood pressure 123/75, pulse 63, temperature 98.8 F (37.1 C), temperature source Oral, resp. rate 20, last menstrual period 01/11/2012, SpO2 97.00%.  General: alert, well-developed, and cooperative to examination.   Head: normocephalic and atraumatic.   Eyes: vision grossly intact, pupils equal, pupils round, pupils reactive to light, no injection and anicteric.   Mouth: pharynx pink and moist, no erythema, and no exudates.   Neck: supple, full ROM, no thyromegaly, no JVD, and no carotid bruits.   Lungs: normal respiratory effort, no accessory muscle use, normal breath sounds, no crackles, and no wheezes.  Heart: normal rate, regular rhythm, no murmur, no gallop, and no rub.   Abdomen: soft, non-tender, normal bowel sounds, no distention, no guarding, no rebound tenderness.  Msk: no joint swelling, no joint warmth, and no redness over joints.   Pulses: 2+ DP/PT pulses bilaterally  Extremities: No cyanosis, clubbing, edema.  No redness or swelling of either leg.  No pain with full ROM of both knees.  Neurologic: alert & oriented X3, cranial nerves II-XII intact, Sensation fully intact to light touch in all 4 extremities.  Strength 5- / 5 in L arm proximally and distally.  Strength 5/5 in proximal and distal L leg, R arm, and R leg.  Skin: turgor normal.  Small areas of elevated linear rash ~2-3inches long on both forearms that she says is new today.  Psych: Oriented X3, memory intact for recent and remote, normally interactive, good eye contact, appears justifiably anxious.   Lab results: Basic Metabolic Panel:  Basename 01/19/12 0946 01/19/12 0848  NA 140 138  K 3.8 3.9  CL 107 102  CO2 -- 23  GLUCOSE 123* 120*  BUN 10 10  CREATININE 0.70 0.66  CALCIUM -- 9.0  MG -- --  PHOS -- --   Liver Function Tests:  [facility] 01/19/12 0848  AST 17  ALT 23  ALKPHOS 68  BILITOT 0.2*  PROT 7.0  ALBUMIN 4.1   CBC:  Basename 01/19/12 0946 01/19/12  0848  WBC -- 13.8*  NEUTROABS -- 11.3*  HGB 13.3 13.4  HCT 39.0 38.7  MCV -- 93.7  PLT -- 319   Cardiac Enzymes:  Basename 01/19/12 0848  CKTOTAL 208*  CKMB 3.4  CKMBINDEX --  TROPONINI <0.30   CBG:  Basename 01/19/12 0801  GLUCAP 129*   Coagulation:  Basename 01/19/12 0848  LABPROT 13.0  INR 0.96   Urine Drug Screen: Drugs of Abuse     Component Value Date/Time   LABOPIA NONE DETECTED 01/19/2012 0953   COCAINSCRNUR NONE DETECTED 01/19/2012 0953   LABBENZ NONE DETECTED 01/19/2012 0953   AMPHETMU NONE DETECTED 01/19/2012 0953   THCU NONE DETECTED 01/19/2012 0953   LABBARB NONE DETECTED 01/19/2012 0953     Imaging results:  Ct Head Wo Contrast  01/19/2012  *RADIOLOGY REPORT*  Clinical Data: Heaviness in both arms and legs.  Possible stroke like symptoms.  CT HEAD WITHOUT CONTRAST  Technique:  Contiguous axial images were obtained from the base of the skull through the vertex without contrast.  Comparison: No priors.  Findings: There is a well-defined focus of low attenuation in the right putamen, compatible with an old lacunar infarction.  No definite acute intracranial abnormalities.  Specifically, no definitive evidence to suggest acute/subacute cerebral ischemia, no hemorrhage, no focal mass, mass effect, hydrocephalus or abnormal intra or extra-axial fluid collections.  No acute displaced skull fractures are identified.  Visualized paranasal sinuses and mastoids are well pneumatized, with exception of complete opacification of the left frontoethmoidal recess.  Postoperative changes of bilateral maxillary antrectomy are noted.  IMPRESSION: 1.  No acute intracranial abnormalities. 2.  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 3.  Status post bilateral maxillary antrectomy.  There is complete opacification of the left frontoethmoidal recess, which may represent a small mucocele.  Original Report Authenticated By: Florencia Reasons, M.D.   Mr Maxine Glenn Head Wo  Contrast  01/19/2012  *RADIOLOGY REPORT*  Clinical Data:  Left-sided weakness.  MRI HEAD WITHOUT CONTRAST MRA HEAD WITHOUT CONTRAST  Technique:  Multiplanar, multiecho pulse sequences of the brain and surrounding structures were obtained without intravenous contrast. Angiographic images of the head were obtained using MRA technique without contrast.  Comparison:  CT head 01/19/2012  MRI HEAD  Findings:  Acute infarct involving the right basal ganglia.  This is primarily in the putamen extending into the tail of the caudate. There may also be some involvement of the posterior limb internal capsule.  No other acute infarct.  No significant chronic ischemic changes.  Negative for hemorrhage or mass.  Ventricle size is normal.  Brainstem is normal.  IMPRESSION: Acute infarct right basal ganglia.  MRA HEAD  Findings: Left vertebral artery is patent to the basilar.  Right vertebral artery is hypoplastic after supplying pica.  Basilar is widely patent.  Superior cerebellar and posterior cerebral arteries are patent.  Prominent left AICA is present supplying the  AICA and PICA territory.  Internal carotid artery is patent bilaterally without stenosis. Anterior and middle cerebral arteries are patent bilaterally. Negative for cerebral aneurysm.  IMPRESSION: Negative  Original Report Authenticated By: Camelia Phenes, M.D.   Mr Brain Wo Contrast  01/19/2012  *RADIOLOGY REPORT*  Clinical Data:  Left-sided weakness.  MRI HEAD WITHOUT CONTRAST MRA HEAD WITHOUT CONTRAST  Technique:  Multiplanar, multiecho pulse sequences of the brain and surrounding structures were obtained without intravenous contrast. Angiographic images of the head were obtained using MRA technique without contrast.  Comparison:  CT head 01/19/2012  MRI HEAD  Findings:  Acute infarct involving the right basal ganglia.  This is primarily in the putamen extending into the tail of the caudate. There may also be some involvement of the posterior limb internal  capsule.  No other acute infarct.  No  significant chronic ischemic changes.  Negative for hemorrhage or mass.  Ventricle size is normal.  Brainstem is normal.  IMPRESSION: Acute infarct right basal ganglia.  MRA HEAD  Findings: Left vertebral artery is patent to the basilar.  Right vertebral artery is hypoplastic after supplying pica.  Basilar is widely patent.  Superior cerebellar and posterior cerebral arteries are patent.  Prominent left AICA is present supplying the  AICA and PICA territory.  Internal carotid artery is patent bilaterally without stenosis. Anterior and middle cerebral arteries are patent bilaterally. Negative for cerebral aneurysm.  IMPRESSION: Negative  Original Report Authenticated By: Camelia Phenes, M.D.    Other results: EKG: normal EKG, borderline normal sinus rhythm / sinus bradycardia in setting of beta blocker therapy.  Assessment & Plan by Problem:  Acute Infarct R Basal Ganglia: Seen on MRI, likely cause of her extremity weakness/heaviness L > R and her tongue heaviness.  Will admit her for stroke workup.  Given her young age and only having HTN and smoking in distant past as risk factors, she likely warrants a workup for possible less common causes of stroke, such as autoimmune disease or clotting disorder.    Aspirin Lipid Panel 2D echocardiogram Carotid Dopplers Bedside swallow eval SLP eval and treat PT/OT eval and treat Hold toprol XL for permissive HTN HbA1c ANA RPR HIV Ab Lyme Dz Antibodies  HTN: Hold Toprol XL for permissive HTN in setting of acute stroke.    Anxiety: Continue Ritalin and Lexapro  OA: Continue home pain regimen prescribed by PCP  DVT Prophylaxis: Lovenox  SignedYaakov Guthrie, BRAD 01/19/2012, 6:02 PM

## 2012-01-20 DIAGNOSIS — I6789 Other cerebrovascular disease: Secondary | ICD-10-CM

## 2012-01-20 DIAGNOSIS — I6340 Cerebral infarction due to embolism of unspecified cerebral artery: Secondary | ICD-10-CM

## 2012-01-20 LAB — HIV ANTIBODY (ROUTINE TESTING W REFLEX): HIV: NONREACTIVE

## 2012-01-20 LAB — LIPID PANEL
LDL Cholesterol: 134 mg/dL — ABNORMAL HIGH (ref 0–99)
VLDL: 49 mg/dL — ABNORMAL HIGH (ref 0–40)

## 2012-01-20 LAB — CBC
HCT: 38.4 % (ref 36.0–46.0)
MCH: 32.1 pg (ref 26.0–34.0)
MCHC: 33.9 g/dL (ref 30.0–36.0)
MCV: 94.8 fL (ref 78.0–100.0)
RDW: 12.6 % (ref 11.5–15.5)

## 2012-01-20 MED ORDER — LORAZEPAM 2 MG/ML IJ SOLN
1.0000 mg | Freq: Once | INTRAMUSCULAR | Status: AC
  Administered 2012-01-20: 1 mg via INTRAVENOUS
  Filled 2012-01-20: qty 1

## 2012-01-20 MED ORDER — SIMVASTATIN 20 MG PO TABS
20.0000 mg | ORAL_TABLET | Freq: Every day | ORAL | Status: DC
  Administered 2012-01-20 – 2012-01-21 (×2): 20 mg via ORAL
  Filled 2012-01-20 (×3): qty 1

## 2012-01-20 NOTE — Progress Notes (Signed)
Occupational Therapy Evaluation Patient Details Name: [name] [name] MRN: [id number] DOB: [birth year] Today's Date: 01/20/2012 Time: 7829-5621 OT Time Calculation (min): 23 min  OT Assessment / Plan / Recommendation Clinical Impression  Pt s/p right basal ganglia CVA. Pt at mod I/independent level with functional mobility and ADLs.  Educated pt on signs and symptoms of stroke.  No further acute OT needs.  Recommend d/c home.    OT Assessment  Patient does not need any further OT services    Follow Up Recommendations  No OT follow up    Barriers to Discharge      Equipment Recommendations  None recommended by OT    Recommendations for Other Services    Frequency       Precautions / Restrictions Precautions Precautions: None   Pertinent Vitals/Pain N/A    ADL  Upper Body Bathing: Simulated;Modified independent Where Assessed - Upper Body Bathing: Unsupported sitting Lower Body Bathing: Simulated;Modified independent Where Assessed - Lower Body Bathing: Unsupported sit to stand Upper Body Dressing: Performed;Modified independent Where Assessed - Upper Body Dressing: Unsupported sitting Lower Body Dressing: Performed;Modified independent Where Assessed - Lower Body Dressing: Unsupported sit to stand Toilet Transfer: Simulated;Modified independent Toilet Transfer Method:  (ambulating) Acupuncturist:  Nurse, children's) Equipment Used: Gait belt Transfers/Ambulation Related to ADLs: Mod I for increased time ADL Comments: Pt able to retrieve ADL items. Mod I for increased time.     OT Diagnosis:    OT Problem List:   OT Treatment Interventions:     OT Goals    Visit Information  Last OT Received On: 01/20/12 Assistance Needed: +1 PT/OT Co-Evaluation/Treatment: Yes    Subjective Data      Prior Functioning  Home Living Lives With: Spouse;Family Available Help at Discharge: Family Type of Home: House Home Access: Stairs to enter Secretary/administrator  of Steps: 3 Entrance Stairs-Rails: None Home Layout: 1/2 bath on main level;Bed/bath upstairs Alternate Level Stairs-Number of Steps: 12 Alternate Level Stairs-Rails: Right Bathroom Shower/Tub: Tub/shower unit;Curtain Bathroom Toilet: Standard Home Adaptive Equipment: None Prior Function Level of Independence: Independent Able to Take Stairs?: Yes Driving: Yes Vocation: Works at Careers adviser: No difficulties    Cognition  Overall Cognitive Status: Appears within functional limits for tasks assessed/performed Arousal/Alertness: Awake/alert Orientation Level: Oriented X4 / Intact Behavior During Session: WFL for tasks performed    Extremity/Trunk Assessment Right Upper Extremity Assessment RUE ROM/Strength/Tone: Within functional levels RUE Sensation: WFL - Light Touch;WFL - Proprioception RUE Coordination: WFL - gross/fine motor Left Upper Extremity Assessment LUE ROM/Strength/Tone: Within functional levels LUE Sensation: WFL - Light Touch;WFL - Proprioception LUE Coordination: WFL - gross/fine motor   Mobility Bed Mobility Bed Mobility: Supine to Sit;Sit to Supine Supine to Sit: 7: Independent Sit to Supine: 7: Independent Transfers Sit to Stand: 7: Independent Stand to Sit: 7: Independent   Exercise    Balance Balance Balance Assessed: Yes Static Standing Balance Static Standing - Balance Support: No upper extremity supported Static Standing - Level of Assistance: 7: Independent Dynamic Standing Balance Dynamic Standing - Balance Support: No upper extremity supported Dynamic Standing - Level of Assistance: 7: Independent High Level Balance High Level Balance Activites: Turns  End of Session OT - End of Session Equipment Utilized During Treatment: Gait belt Activity Tolerance: Patient tolerated treatment well Patient left: in chair;with call bell/phone within reach Nurse Communication: Mobility status  01/20/2012 Cipriano Mile  OTR/L Pager [phone] Office [phone]  Nivedita, Mirabella 01/20/2012, 1:09 PM

## 2012-01-20 NOTE — H&P (Addendum)
On-Call Internal Medicine Attending Admission Note Date: 01/20/2012  Patient name: [name] [name] Medical record number: [id number] Date of birth: [birth year] Age: 41 y.o. Gender: female  I saw and evaluated the patient. I reviewed the resident's note and I agree with the resident's findings and plan as documented in the resident's note, with additional comments as noted below.  Chief Complaint(s): Weakness of all 4 extremities, left greater than right  History - key components related to admission: Patient is a 41 year old woman with a history of hypertension, anxiety, and remote tobacco use admitted with complaint of weakness and heaviness of all 4 extremities that began Friday evening 01/18/2012; the following morning this had progressed to left greater than right weakness, more affecting the left upper extremity.  She reports that her strength has improved to near normal.  She denies any sensory deficits, speech difficulty, or swallowing problems.  Physical Exam - key components related to admission:  Filed Vitals:   01/19/12 1907 01/19/12 2000 01/19/12 2144 01/20/12 0200  BP: 147/97  134/84 143/87  Pulse: 59  55 57  Temp: 98.2 F (36.8 C)  97.9 F (36.6 C) 98.4 F (36.9 C)  TempSrc: Oral  Oral Oral  Resp: 18  18 18   Height:  5' 0.4" (1.534 m)    Weight:  190 lb (86.183 kg)    SpO2: 95%  96% 96%    General: Alert, in no distress Lungs: Clear Heart: Regular; S1-S2, no S3, no S4, no murmurs Abdomen: Bowel sounds present, soft, nontender Extremities: No edema Neurological: Alert and oriented; cranial nerves II through XII intact; strength appears intact with exception of possible mildly decreased left grip (5-/5).  Lab results:   Basic Metabolic Panel:  Basename 01/19/12 0946 01/19/12 0848  NA 140 138  K 3.8 3.9  CL 107 102  CO2 -- 23  GLUCOSE 123* 120*  BUN 10 10  CREATININE 0.70 0.66  CALCIUM -- 9.0  MG -- --  PHOS -- --   Liver Function Tests:  [facility]  01/19/12 0848  AST 17  ALT 23  ALKPHOS 68  BILITOT 0.2*  PROT 7.0  ALBUMIN 4.1    CBC:  Basename 01/20/12 0720 01/19/12 0946 01/19/12 0848  WBC 11.1* -- 13.8*  NEUTROABS -- -- 11.3*  HGB 13.0 13.3 --  HCT 38.4 39.0 --  MCV 94.8 -- 93.7  PLT 259 -- 319    Cardiac Enzymes:  Basename 01/19/12 0848  CKTOTAL 208*  CKMB 3.4  CKMBINDEX --  TROPONINI <0.30    CBG:  Basename 01/19/12 0801  GLUCAP 129*    Fasting Lipid Panel:  Basename 01/20/12 0720  CHOL 224*  HDL 41  LDLCALC 134*  TRIG 247*  CHOLHDL 5.5  LDLDIRECT --    Coagulation:  Basename 01/19/12 0848  INR 0.96    Drugs of Abuse     Component Value Date/Time   LABOPIA NONE DETECTED 01/19/2012 0953   COCAINSCRNUR NONE DETECTED 01/19/2012 0953   LABBENZ NONE DETECTED 01/19/2012 0953   AMPHETMU NONE DETECTED 01/19/2012 0953   THCU NONE DETECTED 01/19/2012 0953   LABBARB NONE DETECTED 01/19/2012 0953    Urinalysis    Component Value Date/Time   COLORURINE YELLOW 10/10/2008 1347   APPEARANCEUR CLEAR 10/10/2008 1347   LABSPEC <1.005* 10/10/2008 1347   PHURINE 5.5 10/10/2008 1347   GLUCOSEU NEGATIVE 10/10/2008 1347   HGBUR NEGATIVE 10/10/2008 1347   BILIRUBINUR NEGATIVE 10/10/2008 1347   KETONESUR NEGATIVE 10/10/2008 1347   PROTEINUR NEGATIVE 10/10/2008 1347  UROBILINOGEN 0.2 10/10/2008 1347   NITRITE NEGATIVE 10/10/2008 1347   LEUKOCYTESUR NEGATIVE MICROSCOPIC NOT DONE ON URINES WITH NEGATIVE PROTEIN, BLOOD, LEUKOCYTES, NITRITE, OR GLUCOSE <1000 mg/dL. 10/10/2008 1347      Imaging results:  Ct Head Wo Contrast  01/19/2012  *RADIOLOGY REPORT*  Clinical Data: Heaviness in both arms and legs.  Possible stroke like symptoms.  CT HEAD WITHOUT CONTRAST  Technique:  Contiguous axial images were obtained from the base of the skull through the vertex without contrast.  Comparison: No priors.  Findings: There is a well-defined focus of low attenuation in the right putamen, compatible with an old lacunar infarction.  No definite  acute intracranial abnormalities.  Specifically, no definitive evidence to suggest acute/subacute cerebral ischemia, no hemorrhage, no focal mass, mass effect, hydrocephalus or abnormal intra or extra-axial fluid collections.  No acute displaced skull fractures are identified.  Visualized paranasal sinuses and mastoids are well pneumatized, with exception of complete opacification of the left frontoethmoidal recess.  Postoperative changes of bilateral maxillary antrectomy are noted.  IMPRESSION: 1.  No acute intracranial abnormalities. 2.  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 3.  Status post bilateral maxillary antrectomy.  There is complete opacification of the left frontoethmoidal recess, which may represent a small mucocele.  Original Report Authenticated By: Florencia Reasons, M.D.   Mr Maxine Glenn Head Wo Contrast  01/19/2012  *RADIOLOGY REPORT*  Clinical Data:  Left-sided weakness.  MRI HEAD WITHOUT CONTRAST MRA HEAD WITHOUT CONTRAST  Technique:  Multiplanar, multiecho pulse sequences of the brain and surrounding structures were obtained without intravenous contrast. Angiographic images of the head were obtained using MRA technique without contrast.  Comparison:  CT head 01/19/2012  MRI HEAD  Findings:  Acute infarct involving the right basal ganglia.  This is primarily in the putamen extending into the tail of the caudate. There may also be some involvement of the posterior limb internal capsule.  No other acute infarct.  No significant chronic ischemic changes.  Negative for hemorrhage or mass.  Ventricle size is normal.  Brainstem is normal.  IMPRESSION: Acute infarct right basal ganglia.  MRA HEAD  Findings: Left vertebral artery is patent to the basilar.  Right vertebral artery is hypoplastic after supplying pica.  Basilar is widely patent.  Superior cerebellar and posterior cerebral arteries are patent.  Prominent left AICA is present supplying the  AICA and PICA territory.  Internal  carotid artery is patent bilaterally without stenosis. Anterior and middle cerebral arteries are patent bilaterally. Negative for cerebral aneurysm.  IMPRESSION: Negative  Original Report Authenticated By: Camelia Phenes, M.D.   Mr Brain Wo Contrast  01/19/2012  *RADIOLOGY REPORT*  Clinical Data:  Left-sided weakness.  MRI HEAD WITHOUT CONTRAST MRA HEAD WITHOUT CONTRAST  Technique:  Multiplanar, multiecho pulse sequences of the brain and surrounding structures were obtained without intravenous contrast. Angiographic images of the head were obtained using MRA technique without contrast.  Comparison:  CT head 01/19/2012  MRI HEAD  Findings:  Acute infarct involving the right basal ganglia.  This is primarily in the putamen extending into the tail of the caudate. There may also be some involvement of the posterior limb internal capsule.  No other acute infarct.  No significant chronic ischemic changes.  Negative for hemorrhage or mass.  Ventricle size is normal.  Brainstem is normal.  IMPRESSION: Acute infarct right basal ganglia.  MRA HEAD  Findings: Left vertebral artery is patent to the basilar.  Right vertebral artery is  hypoplastic after supplying pica.  Basilar is widely patent.  Superior cerebellar and posterior cerebral arteries are patent.  Prominent left AICA is present supplying the  AICA and PICA territory.  Internal carotid artery is patent bilaterally without stenosis. Anterior and middle cerebral arteries are patent bilaterally. Negative for cerebral aneurysm.  IMPRESSION: Negative  Original Report Authenticated By: Camelia Phenes, M.D.    Other results: EKG:  Sinus rhythm; nonspecific ST abnormality   Assessment & Plan by Problem:  1 Acute basal ganglia infarct.  Patient's symptom of weakness has largely resolved.  Risk factors for stroke include hypertension, and her lipid panel shows hyperlipidemia.  Neurology consult saw patient in the ED, and diagnostic workup is in progress to include  transthoracic echocardiogram with bubble study, possible TEE, carotid Dopplers, telemetry monitoring, and hypercoagulable workup.  Consults have been requested from physical therapy, occupational therapy, and speech.  Patient is on aspirin for stroke prophylaxis.  Would discuss the question of Ritalin with neurology, since the Ritalin medication information mentions a possible increase in cardiovascular events including stroke associated with Ritalin.  If Ritalin does confer risk of stroke, then would recommend stopping that medication; would also discuss with neurology whether it needs to be tapered or can be stopped abruptly (patient has been on Ritalin chronically for anxiety by her report).   2. Hypertension.  Blood pressure is acceptable currently; metoprolol was held initially in the setting of acute stroke, but will likely need to be resumed prior to discharge home.  3. Hyperlipidemia.  Plan is treat with statin medication.  4. Dr. Rogelia Boga will return as attending physician tomorrow 01/21/2012

## 2012-01-20 NOTE — Progress Notes (Signed)
HPI: 41 y.o. Female with HTN, ADHD, and anxiety, with bilateral hand and leg numbness on 01/18/12 PM, with "thick tongue". Called paramedic, with normal vitals, and waited out her symptoms. Next AM, noticed left arm/leg wekaness and came to [facility].  Recent events: Friday AM had knee drainage and cortisone injection. Also donated blood.  She has no prior history of stroke or MI, no history of clotting disorder, multiple miscarriages or DVT. She also has no significant family history for such. She does not take oral contraceptives, nor does she smoke. She does have a history of hypertension.   Subjective: No new events. Feel 95% back to normal. LUE still mild weakness. Speech back to normal.  Objective: Vital signs in last 24 hours: Blood pressure 143/87, pulse 57, temperature 98.4 F (36.9 C), temperature source Oral, resp. rate 18, height 5' 0.4" (1.534 m), weight 86.183 kg (190 lb), last menstrual period 01/11/2012, SpO2 96.00%. Temp:  [97.9 F (36.6 C)-98.8 F (37.1 C)] 98.4 F (36.9 C) (05/19 0200) Pulse Rate:  [55-65] 57  (05/19 0200) Resp:  [15-20] 18  (05/19 0200) BP: (110-147)/(63-97) 143/87 mmHg (05/19 0200) SpO2:  [94 %-97 %] 96 % (05/19 0200) Weight:  [86.183 kg (190 lb)] 86.183 kg (190 lb) (05/18 2000)  Intake/Output from previous day:    GENERAL EXAM: Patient is in no distress  CARDIOVASCULAR: Regular rate and rhythm, no murmurs, no carotid bruits  NEUROLOGIC: MENTAL STATUS: awake, alert, language fluent, comprehension intact, naming intact CRANIAL NERVE: pupils equal and reactive to light, visual fields full to confrontation, extraocular muscles intact, no nystagmus, facial sensation and strength symmetric, uvula midline, shoulder shrug symmetric, tongue midline. MOTOR: normal bulk and tone, full strength in the BUE, BLE SENSORY: normal and symmetric to light touch COORDINATION: DECREASED FFM IN LUE.  REFLEXES: deep tendon reflexes present and  symmetric GAIT/STATION: narrow based gait.   Lab Results:  Basename 01/20/12 0720 01/19/12 0946 01/19/12 0848  WBC 11.1* -- 13.8*  HGB 13.0 13.3 --  HCT 38.4 39.0 --  PLT 259 -- 319   BMET  Basename 01/19/12 0946 01/19/12 0848  NA 140 138  K 3.8 3.9  CL 107 102  CO2 -- 23  GLUCOSE 123* 120*  BUN 10 10  CREATININE 0.70 0.66  CALCIUM -- 9.0   Lipid Panel     Component Value Date/Time   CHOL 224* 01/20/2012 0720   TRIG 247* 01/20/2012 0720   HDL 41 01/20/2012 0720   CHOLHDL 5.5 01/20/2012 0720   VLDL 49* 01/20/2012 0720   LDLCALC 134* 01/20/2012 0720    Studies/Results: MRI HEAD WITHOUT CONTRAST IMPRESSION: Acute infarct right basal ganglia.    MRA HEAD IMPRESSION: Negative  Original Report Authenticated By: Camelia Phenes, M.D.   TTE - pending  Carotid u/s - pending  Medications:     . aspirin  300 mg Rectal Daily   Or  . aspirin  325 mg Oral Daily  . enoxaparin  40 mg Subcutaneous Q24H  . escitalopram  20 mg Oral Daily  . naproxen  500 mg Oral Q12H  . simvastatin  20 mg Oral q1800  . sodium chloride  3 mL Intravenous Q12H  . DISCONTD: methylphenidate  20 mg Oral Daily    Assessment/Plan: Patient Active Hospital Problem List: Acute ischemic stroke (01/19/2012) - right basal ganglia lacunar stroke; probably small vessel thrombosis due to risk factors (HTN and hypercholesterolemia) - aspirin - statin - gradually resume BP control over next 24-48 hours - reasonable  to hold methylphenidate until acute stroke symptoms stabilized and resume medication on discharge. I do not think that this medication was causative for patient's stroke. There are no definite long term risks for cardiac or cerebral vascular events with stimulants based on 2011 JAMA study ("ADHD medications and risk of serious cardiovascular events in young and middle-aged adults." JAMA. 2011 Dec 28;306(24):2673-83).    LOS: 1 day   [name] [name] 01/20/2012, 11:11 AM

## 2012-01-20 NOTE — Evaluation (Signed)
Physical Therapy Evaluation Patient Details Name: [name] [name] MRN: [id number] DOB: Jul 28, 1971 Today's Date: 01/20/2012 Time: 1191-4782 PT Time Calculation (min): 23 min  PT Assessment / Plan / Recommendation Clinical Impression  Pt is independent with all mobility skills.  No balance deficits noted.  [name] [name] reports weakness and sensation deficits have returned to normal.      PT Assessment  Patent does not need any further PT services    Follow Up Recommendations  No PT follow up    Barriers to Discharge        lEquipment Recommendations  None recommended by PT    Recommendations for Other Services     Frequency      Precautions / Restrictions Precautions Precautions: None   Pertinent Vitals/Pain 0/10      Mobility  Bed Mobility Bed Mobility: Supine to Sit;Sit to Supine Supine to Sit: 7: Independent Sit to Supine: 7: Independent Transfers Transfers: Sit to Stand;Stand to [employer] Transfers Sit to Stand: 7: Independent Stand to Sit: 7: Independent Stand Pivot Transfers: 7: Independent Ambulation/Gait Ambulation/Gait Assistance: 7: Independent Ambulation Distance (Feet): 350 Feet Assistive device: None Gait Pattern: Within Functional Limits Gait velocity: WNL    Exercises     PT Diagnosis:    PT Problem List:   PT Treatment Interventions:     PT Goals    Visit Information  Last PT Received On: 01/20/12 Assistance Needed: +1 PT/OT Co-Evaluation/Treatment: Yes    Subjective Data  Subjective: I am just tired at this point. Patient Stated Goal: home   Prior Functioning  Home Living Lives With: Spouse;Family Available Help at Discharge: Family Type of Home: House Home Access: Stairs to enter Secretary/administrator of Steps: 3 Entrance Stairs-Rails: None Home Layout: Two level;1/2 bath on main level;Bed/bath upstairs Alternate Level Stairs-Number of Steps: 12 Alternate Level Stairs-Rails: Right Bathroom Toilet: Standard Home Adaptive  Equipment: None Prior Function Level of Independence: Independent Able to Take Stairs?: Yes Driving: Yes Vocation: Works at home Communication Communication: No difficulties    Cognition  Overall Cognitive Status: Appears within functional limits for tasks assessed/performed Arousal/Alertness: Awake/alert Orientation Level: Oriented X4 / Intact Behavior During Session: [facility] for tasks performed    Extremity/Trunk Assessment     Balance Balance Balance Assessed: Yes Static Standing Balance Static Standing - Balance Support: No upper extremity supported Static Standing - Level of Assistance: 7: Independent Dynamic Standing Balance Dynamic Standing - Balance Support: No upper extremity supported Dynamic Standing - Level of Assistance: 7: Independent High Level Balance High Level Balance Activites: Turns  End of Session PT - End of Session Activity Tolerance: Patient tolerated treatment well Patient left: in chair   Ilda Foil 01/20/2012, 1:01 PM  Aida Raider, PT  Office # [phone] Pager [phone]

## 2012-01-20 NOTE — ED Provider Notes (Signed)
Medical screening examination/treatment/procedure(s) were conducted as a shared visit with non-physician practitioner(s) and myself.  I personally evaluated the patient during the encounter  Dysarthria and Left side weakness, concerning for CVA, outside TPA window  tPA in stroke considered, but not given due to the following: Onset over 3-4.5 hours.     Flint Melter, MD 01/20/12 [phone]

## 2012-01-20 NOTE — Progress Notes (Signed)
*  PRELIMINARY RESULTS* Echocardiogram 2D Echocardiogram has been performed.  [name] [name] 01/20/2012, 9:56 AM

## 2012-01-20 NOTE — Progress Notes (Signed)
Subjective:  Patient reports her R arm weakness is nearly back to baseline.  She reports that her tongue and speech are back to baseline.  No overnight events.  No other complaints.    Objective: Vital signs in last 24 hours: Filed Vitals:   01/19/12 1907 01/19/12 2000 01/19/12 2144 01/20/12 0200  BP: 147/97  134/84 143/87  Pulse: 59  55 57  Temp: 98.2 F (36.8 C)  97.9 F (36.6 C) 98.4 F (36.9 C)  TempSrc: Oral  Oral Oral  Resp: 18  18 18   Height:  5' 0.4" (1.534 m)    Weight:  190 lb (86.183 kg)    SpO2: 95%  96% 96%    General: alert, well-developed, and cooperative to examination.   Head: normocephalic and atraumatic.   Eyes: vision grossly intact, pupils equal, pupils round, pupils reactive to light, no injection and anicteric.   Mouth: pharynx pink and moist, no erythema, and no exudates.   Neck: no JVD  Lungs: normal respiratory effort, no accessory muscle use, normal breath sounds, no crackles, and no wheezes.   Heart: normal rate, regular rhythm, no murmur, no gallop, and no rub.   Msk: no joint swelling, no joint warmth, and no redness over joints.   Neurologic: alert & oriented X3, cranial nerves II-XII intact, Sensation fully intact to light touch in all 4 extremities. Strength 5/5 in all extremities and symmetric.        Lab Results: Basic Metabolic Panel:  Lab 01/19/12 1610 01/19/12 0848  NA 140 138  K 3.8 3.9  CL 107 102  CO2 -- 23  GLUCOSE 123* 120*  BUN 10 10  CREATININE 0.70 0.66  CALCIUM -- 9.0  MG -- --  PHOS -- --   Liver Function Tests:  Lab 01/19/12 0848  AST 17  ALT 23  ALKPHOS 68  BILITOT 0.2*  PROT 7.0  ALBUMIN 4.1   CBC:  Lab 01/20/12 0720 01/19/12 0946 01/19/12 0848  WBC 11.1* -- 13.8*  NEUTROABS -- -- 11.3*  HGB 13.0 13.3 --  HCT 38.4 39.0 --  MCV 94.8 -- 93.7  PLT 259 -- 319   Cardiac Enzymes:  Lab 01/19/12 0848  CKTOTAL 208*  CKMB 3.4  CKMBINDEX --  TROPONINI <0.30   CBG:  Lab 01/19/12 0801    GLUCAP 129*   Fasting Lipid Panel:  Lab 01/20/12 0720  CHOL 224*  HDL 41  LDLCALC 134*  TRIG 247*  CHOLHDL 5.5  LDLDIRECT --   Coagulation:  Lab 01/19/12 0848  LABPROT 13.0  INR 0.96   Urine Drug Screen: Drugs of Abuse     Component Value Date/Time   LABOPIA NONE DETECTED 01/19/2012 0953   COCAINSCRNUR NONE DETECTED 01/19/2012 0953   LABBENZ NONE DETECTED 01/19/2012 0953   AMPHETMU NONE DETECTED 01/19/2012 0953   THCU NONE DETECTED 01/19/2012 0953   LABBARB NONE DETECTED 01/19/2012 0953    Studies/Results: Ct Head Wo Contrast  01/19/2012  *RADIOLOGY REPORT*  Clinical Data: Heaviness in both arms and legs.  Possible stroke like symptoms.  CT HEAD WITHOUT CONTRAST  Technique:  Contiguous axial images were obtained from the base of the skull through the vertex without contrast.  Comparison: No priors.  Findings: There is a well-defined focus of low attenuation in the right putamen, compatible with an old lacunar infarction.  No definite acute intracranial abnormalities.  Specifically, no definitive evidence to suggest acute/subacute cerebral ischemia, no hemorrhage, no focal mass, mass effect, hydrocephalus or abnormal intra or  extra-axial fluid collections.  No acute displaced skull fractures are identified.  Visualized paranasal sinuses and mastoids are well pneumatized, with exception of complete opacification of the left frontoethmoidal recess.  Postoperative changes of bilateral maxillary antrectomy are noted.  IMPRESSION: 1.  No acute intracranial abnormalities. 2.  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 3.  Status post bilateral maxillary antrectomy.  There is complete opacification of the left frontoethmoidal recess, which may represent a small mucocele.  Original Report Authenticated By: Florencia Reasons, M.D.   Mr Maxine Glenn Head Wo Contrast  01/19/2012  *RADIOLOGY REPORT*  Clinical Data:  Left-sided weakness.  MRI HEAD WITHOUT CONTRAST MRA HEAD WITHOUT CONTRAST   Technique:  Multiplanar, multiecho pulse sequences of the brain and surrounding structures were obtained without intravenous contrast. Angiographic images of the head were obtained using MRA technique without contrast.  Comparison:  CT head 01/19/2012  MRI HEAD  Findings:  Acute infarct involving the right basal ganglia.  This is primarily in the putamen extending into the tail of the caudate. There may also be some involvement of the posterior limb internal capsule.  No other acute infarct.  No significant chronic ischemic changes.  Negative for hemorrhage or mass.  Ventricle size is normal.  Brainstem is normal.  IMPRESSION: Acute infarct right basal ganglia.  MRA HEAD  Findings: Left vertebral artery is patent to the basilar.  Right vertebral artery is hypoplastic after supplying pica.  Basilar is widely patent.  Superior cerebellar and posterior cerebral arteries are patent.  Prominent left AICA is present supplying the  AICA and PICA territory.  Internal carotid artery is patent bilaterally without stenosis. Anterior and middle cerebral arteries are patent bilaterally. Negative for cerebral aneurysm.  IMPRESSION: Negative  Original Report Authenticated By: Camelia Phenes, M.D.   Mr Brain Wo Contrast  01/19/2012  *RADIOLOGY REPORT*  Clinical Data:  Left-sided weakness.  MRI HEAD WITHOUT CONTRAST MRA HEAD WITHOUT CONTRAST  Technique:  Multiplanar, multiecho pulse sequences of the brain and surrounding structures were obtained without intravenous contrast. Angiographic images of the head were obtained using MRA technique without contrast.  Comparison:  CT head 01/19/2012  MRI HEAD  Findings:  Acute infarct involving the right basal ganglia.  This is primarily in the putamen extending into the tail of the caudate. There may also be some involvement of the posterior limb internal capsule.  No other acute infarct.  No significant chronic ischemic changes.  Negative for hemorrhage or mass.  Ventricle size is  normal.  Brainstem is normal.  IMPRESSION: Acute infarct right basal ganglia.  MRA HEAD  Findings: Left vertebral artery is patent to the basilar.  Right vertebral artery is hypoplastic after supplying pica.  Basilar is widely patent.  Superior cerebellar and posterior cerebral arteries are patent.  Prominent left AICA is present supplying the  AICA and PICA territory.  Internal carotid artery is patent bilaterally without stenosis. Anterior and middle cerebral arteries are patent bilaterally. Negative for cerebral aneurysm.  IMPRESSION: Negative  Original Report Authenticated By: Camelia Phenes, M.D.   Medications: I have reviewed the patient's current medications. Scheduled Meds:   . aspirin  300 mg Rectal Daily   Or  . aspirin  325 mg Oral Daily  . enoxaparin  40 mg Subcutaneous Q24H  . escitalopram  20 mg Oral Daily  . methylphenidate  20 mg Oral Daily  . naproxen  500 mg Oral Q12H  . simvastatin  20 mg Oral q1800  .  sodium chloride  3 mL Intravenous Q12H   Continuous Infusions:  PRN Meds:.sodium chloride, acetaminophen, acetaminophen, loperamide, ondansetron (ZOFRAN) IV, senna-docusate, sodium chloride, traMADol, DISCONTD: traMADol Assessment/Plan:  LOS: 1 day   Acute Infarct R Basal Ganglia: Seen on MRI, likely cause of her extremity weakness/heaviness L > R and her tongue heaviness. Her symptoms are nearly fully resolved this morning, with no weakness on exam.  Given her young age and only having HTN and smoking in distant past as risk factors,  less common causes of stroke, such as autoimmune disease or clotting disorder, are possibilities.  HIV, RPR negative.  Passed bedside swallow, tongue heaviness resolved.  Aspirin daily for life 2D echocardiogram with bubble study pending Carotid Dopplers pending PT/OT eval and treat  Hold toprol XL for permissive HTN  Lyme Dz Antibodies, ANA, HbA1c pending LDL 134, statin started with discharge on pravastatin Consider hypercoag panel  pending neuro team opinion in acute setting  HTN: Hold Toprol XL for permissive HTN in setting of acute stroke.   Anxiety: Continue Lexapro.  Will ask neurology consultant with their expertise on whether Ritalin increases stroke risk.  OA: Continue home pain regimen prescribed by PCP   DVT Prophylaxis: Lovenox      Honora Searson, BRAD 01/20/2012, 9:54 AM

## 2012-01-20 NOTE — Progress Notes (Signed)
VASCULAR LAB PRELIMINARY  PRELIMINARY  PRELIMINARY  PRELIMINARY  Carotid duplex  completed.    Preliminary report:  Bilateral:  No evidence of hemodynamically significant internal carotid artery stenosis.   Vertebral artery flow is antegrade.     Terance Hart, RVT 01/20/2012, 2:07 PM

## 2012-01-21 DIAGNOSIS — I6340 Cerebral infarction due to embolism of unspecified cerebral artery: Secondary | ICD-10-CM

## 2012-01-21 LAB — ANA: Anti Nuclear Antibody(ANA): NEGATIVE

## 2012-01-21 MED ORDER — TRAMADOL HCL 50 MG PO TABS
50.0000 mg | ORAL_TABLET | Freq: Four times a day (QID) | ORAL | Status: DC | PRN
  Administered 2012-01-21 – 2012-01-22 (×3): 50 mg via ORAL
  Filled 2012-01-21 (×3): qty 1

## 2012-01-21 MED ORDER — AMLODIPINE BESYLATE 2.5 MG PO TABS
2.5000 mg | ORAL_TABLET | Freq: Every day | ORAL | Status: DC
  Administered 2012-01-21 – 2012-01-22 (×2): 2.5 mg via ORAL
  Filled 2012-01-21 (×2): qty 1

## 2012-01-21 MED ORDER — LORAZEPAM 1 MG PO TABS: 1.0000 mg | ORAL_TABLET | Freq: Every evening | ORAL | Status: DC | PRN

## 2012-01-21 MED ORDER — ASPIRIN EC 81 MG PO TBEC
81.0000 mg | DELAYED_RELEASE_TABLET | Freq: Every day | ORAL | Status: DC
  Administered 2012-01-22: 81 mg via ORAL
  Filled 2012-01-21 (×2): qty 1

## 2012-01-21 MED ORDER — LORAZEPAM 2 MG/ML IJ SOLN
1.0000 mg | Freq: Every evening | INTRAMUSCULAR | Status: DC | PRN
  Administered 2012-01-21: 1 mg via INTRAVENOUS
  Filled 2012-01-21: qty 1

## 2012-01-21 NOTE — Discharge Instructions (Signed)
STROKE/TIA DISCHARGE INSTRUCTIONS SMOKING Cigarette smoking nearly doubles your risk of having a stroke & is the single most alterable risk factor  If you smoke or have smoked in the last 12 months, you are advised to quit smoking for your health.  Most of the excess cardiovascular risk related to smoking disappears within a year of stopping.  Ask you doctor about anti-smoking medications   Quit Line: 1-800-QUIT NOW  Free Smoking Cessation Classes [phone]  CHOLESTEROL Know your levels; limit fat & cholesterol in your diet  Lipid Panel     Component Value Date/Time   CHOL 224* 01/20/2012 0720   TRIG 247* 01/20/2012 0720   HDL 41 01/20/2012 0720   CHOLHDL 5.5 01/20/2012 0720   VLDL 49* 01/20/2012 0720   LDLCALC 134* 01/20/2012 0720      Many patients benefit from treatment even if their cholesterol is at goal.  Goal: Total Cholesterol (CHOL) less than 160  Goal:  Triglycerides (TRIG) less than 150  Goal:  HDL greater than 40  Goal:  LDL (LDLCALC) less than 100   BLOOD PRESSURE American Stroke Association blood pressure target is less that 120/80 mm/Hg  Your discharge blood pressure is:  BP: 132/84 mmHg  Monitor your blood pressure  Limit your salt and alcohol intake  Many individuals will require more than one medication for high blood pressure  DIABETES (A1c is a blood sugar average for last 3 months) Goal HGBA1c is under 7% (HBGA1c is blood sugar average for last 3 months)  Diabetes: {STROKE DC DIABETES:22357}    Lab Results  Component Value Date   HGBA1C 5.6 01/20/2012     Your HGBA1c can be lowered with medications, healthy diet, and exercise.  Check your blood sugar as directed by your physician  Call your physician if you experience unexplained or low blood sugars.  PHYSICAL ACTIVITY/REHABILITATION Goal is 30 minutes at least 4 days per week    {STROKE DC ACTIVITY/REHAB:22359}  Activity decreases your risk of heart attack and stroke and makes your heart  stronger.  It helps control your weight and blood pressure; helps you relax and can improve your mood.  Participate in a regular exercise program.  Talk with your doctor about the best form of exercise for you (dancing, walking, swimming, cycling).  DIET/WEIGHT Goal is to maintain a healthy weight  Your discharge diet is: General *** liquids Your height is:  Height: 5' 0.4" (153.4 cm) Your current weight is: Weight: 86.183 kg (190 lb) Your Body Mass Index (BMI) is:  BMI (Calculated): 36.7   Following the type of diet specifically designed for you will help prevent another stroke.  Your goal weight range is:  ***  Your goal Body Mass Index (BMI) is 19-24.  Healthy food habits can help reduce 3 risk factors for stroke:  High cholesterol, hypertension, and excess weight.  RESOURCES Stroke/Support Group:  Call [phone]  they meet the 3rd Sunday of the month on the Rehab Unit at [facility], [place] ( no meetings June, July & Aug).  STROKE EDUCATION PROVIDED/REVIEWED AND GIVEN TO PATIENT Stroke warning signs and symptoms How to activate emergency medical system (call 911). Medications prescribed at discharge. Need for follow-up after discharge. Personal risk factors for stroke. Pneumonia vaccine given:   {STROKE DC YES/NO/DATE:22363} Flu vaccine given:   {STROKE DC YES/NO/DATE:22363} My questions have been answered, the writing is legible, and I understand these instructions.  I will adhere to these goals & educational materials that have been provided  to me after my discharge from the hospital.

## 2012-01-21 NOTE — Progress Notes (Signed)
Stroke Team Progress Note  HISTORY 41 y.o. Female with HTN, ADHD, and anxiety, with bilateral hand and leg numbness on 01/18/12 PM, with "thick tongue". Called paramedic, with normal vitals, and waited out her symptoms. Next AM, noticed left arm/leg weakness and came to . Recent events: Friday AM 01/18/2012 had knee drainage and cortisone injection. Also donated blood.  She has no prior history of stroke or MI, no history of clotting disorder, multiple miscarriages or DVT. She also has no significant family history for such. She does not take oral contraceptives, nor does she smoke. She does have a history of hypertension. Patient was not a TPA candidate secondary to delay in arrival. She was admitted for further evaluation and treatment.  SUBJECTIVE No one is at the bedside.  Overall she feels her deficts are completely resolved. Has 4 girls, 2 oldest in school, 2 youngest at home. Back on Ritalin x 1 year.Also was on naprosyn twice daily x 3 months for right knee pain.  OBJECTIVE Most recent Vital Signs: Filed Vitals:   01/20/12 1729 01/20/12 2042 01/21/12 0252 01/21/12 0516  BP: 130/87 157/84 150/80 132/84  Pulse: 64 64 68 71  Temp: 98.3 F (36.8 C) 98 F (36.7 C) 98.6 F (37 C) 98.2 F (36.8 C)  TempSrc: Oral Oral Oral Oral  Resp: 18 18 18 18  Height:      Weight:      SpO2: 97% 96% 94% 96%   CBG (last 3)  Basename 01/19/12 0801  GLUCAP 129*   Intake/Output from previous day:   IV Fluid Intake:     MEDICATIONS    . aspirin  300 mg Rectal Daily   Or  . aspirin  325 mg Oral Daily  . enoxaparin  40 mg Subcutaneous Q24H  . escitalopram  20 mg Oral Daily  . LORazepam  1 mg Intravenous Once  . naproxen  500 mg Oral Q12H  . simvastatin  20 mg Oral q1800  . sodium chloride  3 mL Intravenous Q12H  . DISCONTD: methylphenidate  20 mg Oral Daily   PRN:  sodium chloride, acetaminophen, acetaminophen, loperamide, ondansetron (ZOFRAN) IV, senna-docusate, sodium chloride,  traMADol  Diet:  General thin liquids Activity:  As tolerated DVT Prophylaxis:  Lovenox 40 mg sq daily   CLINICALLY SIGNIFICANT STUDIES Basic Metabolic Panel:  Lab 01/19/12 0946 01/19/12 0848  NA 140 138  K 3.8 3.9  CL 107 102  CO2 -- 23  GLUCOSE 123* 120*  BUN 10 10  CREATININE 0.70 0.66  CALCIUM -- 9.0  MG -- --  PHOS -- --   Liver Function Tests:  Lab 01/19/12 0848  AST 17  ALT 23  ALKPHOS 68  BILITOT 0.2*  PROT 7.0  ALBUMIN 4.1   CBC:  Lab 01/20/12 0720 01/19/12 0946 01/19/12 0848  WBC 11.1* -- 13.8*  NEUTROABS -- -- 11.3*  HGB 13.0 13.3 --  HCT 38.4 39.0 --  MCV 94.8 -- 93.7  PLT 259 -- 319   Coagulation:  Lab 01/19/12 0848  LABPROT 13.0  INR 0.96   Cardiac Enzymes:  Lab 01/19/12 0848  CKTOTAL 208*  CKMB 3.4  CKMBINDEX --  TROPONINI <0.30   Lipid Panel    Component Value Date/Time   CHOL 224* 01/20/2012 0720   TRIG 247* 01/20/2012 0720   HDL 41 01/20/2012 0720   CHOLHDL 5.5 01/20/2012 0720   VLDL 49* 01/20/2012 0720   LDLCALC 134* 01/20/2012 0720   HgbA1C  Lab Results  Component   Value Date   HGBA1C 5.6 01/20/2012    Urine Drug Screen:     Component Value Date/Time   LABOPIA NONE DETECTED 01/19/2012 0953   COCAINSCRNUR NONE DETECTED 01/19/2012 0953   LABBENZ NONE DETECTED 01/19/2012 0953   AMPHETMU NONE DETECTED 01/19/2012 0953   THCU NONE DETECTED 01/19/2012 0953   LABBARB NONE DETECTED 01/19/2012 0953    Alcohol Level: No results found for this basename: ETH:2 in the last 168 hours  ANA in process RPR neg HIV neg B. burgdorfi antibodies in process  CT of the brain  01/19/2012   1.  No acute intracranial abnormalities. 2.  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 3.  Status post bilateral maxillary antrectomy.  There is complete opacification of the left frontoethmoidal recess, which may represent a small mucocele.    MRI of the brain  01/19/2012  Acute infarct right basal ganglia.  MRA of the brain  01/19/2012 no large  vessel stenosis  2D Echocardiogram  EF 60-65%; no source of embolus.   Carotid Doppler  No internal carotid artery stenosis bilaterally. Vertebrals with antegrade flow bilaterally.   CXR    EKG  normal sinus rhythm, nonspecific ST and T waves changes.   Therapy Recommendations PT -none, OT -none  GENERAL EXAM:  Patient is in no distress  CARDIOVASCULAR:  Regular rate and rhythm, no murmurs, no carotid bruits  NEUROLOGIC:  MENTAL STATUS: awake, alert, language fluent, comprehension intact, naming intact  CRANIAL NERVE: pupils equal and reactive to light, visual fields full to confrontation, extraocular muscles intact, no nystagmus, facial sensation and strength symmetric, uvula midline, shoulder shrug symmetric, tongue midline.  MOTOR: normal bulk and tone, full strength in the BUE, BLE Orbits right over left upper extremity SENSORY: normal and symmetric to light touch  COORDINATION: DECREASED FFM IN LUE.  REFLEXES: deep tendon reflexes present and symmetric  GAIT/STATION: narrow based gait.  ASSESSMENT [name] is a 41 y.o. 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 While it looks like small vessel disease, etiology is unclear. etiologies in a 40 yo also includes vasculitis, HIV, Lyme`s, sarcoid, PFO/VTE, Sjogrens,  etc. .  On no antiplatelet prior to admission. Now on aspirin 325 mg orally every day for secondary stroke prevention. Patient with no resultant symptoms.  -hypertension -hyperlipidemia, LDL 134 on statin -obesity, Body mass index is 36.62 kg/(m^2). -on Ritalin x 1 year. It can cause vasospasm. -high caffeine intake -on naproxen prior to admission for knee pain. NSAIDS have been associated with strokes in young. Tramadol is ok.  Hospital day # 2  TREATMENT/PLAN -Continue aspirin 325 mg orally every day for secondary stroke prevention. - Recommend she not take Ritalin due to vasospasm side effects; follow up with primary  MD   for alternative nonstimulants -decrease caffeine intake to less than 2 cups/day -stop naproxen due to associated cardiac/stroke risk from NSAID long term use. -recommend TEE to look for embolic source. If positive for PFO (patent foramen ovale), needs bilateral lower extremity dopplers to rule out DVT as source of stroke. -hypercoagulable panel (Minus factor V & Beta-2-glycoprotein, both associated with venous not arterial infarcts). Consider  C3,C4, CH50 labs if ANA positive -D/W medical team and Dr Lindzen SHARON BIBY, AVNP, ANP-BC, GNP-BC [facility] Pager: [phone] 01/21/2012 7:45 AM  Scribe for Dr. Earle Burson, Stroke Center Medical Director. He has personally reviewed chart, pertinent data, examined the patient and developed the plan of care.   Pager:  [phone]   

## 2012-01-21 NOTE — Progress Notes (Signed)
Subjective: Patient reports that her L limb weakness/heaviness completely resolved today.  She is now back to her normal self per pt.      Objective: Vital signs in last 24 hours: Filed Vitals:   01/21/12 0252 01/21/12 0516 01/21/12 1000 01/21/12 1400  BP: 150/80 132/84 133/90 127/88  Pulse: 68 71 59 67  Temp: 98.6 F (37 C) 98.2 F (36.8 C) 98.2 F (36.8 C) 97.4 F (36.3 C)  TempSrc: Oral Oral Oral Oral  Resp: 18 18 18 18   Height:      Weight:      SpO2: 94% 96% 94% 98%    General: alert, well-developed, and cooperative to examination.   Head: normocephalic and atraumatic.   Eyes: vision grossly intact, pupils equal, pupils round, pupils reactive to light, no injection and anicteric.   Mouth: pharynx pink and moist, no erythema, and no exudates.   Neck: no JVD   Lungs: normal respiratory effort, no accessory muscle use, normal breath sounds, no crackles, and no wheezes.   Heart: normal rate, regular rhythm, no murmur, no gallop, and no rub.   Msk: no joint swelling, no joint warmth, and no redness over joints.   Neurologic: alert & oriented X3, cranial nerves II-XII intact, Sensation fully intact to light touch in all 4 extremities. Strength 5/5 in all extremities and symmetric.      Lab Results: Basic Metabolic Panel:  Lab 01/19/12 2130 01/19/12 0848  NA 140 138  K 3.8 3.9  CL 107 102  CO2 -- 23  GLUCOSE 123* 120*  BUN 10 10  CREATININE 0.70 0.66  CALCIUM -- 9.0  MG -- --  PHOS -- --   Liver Function Tests:  Lab 01/19/12 0848  AST 17  ALT 23  ALKPHOS 68  BILITOT 0.2*  PROT 7.0  ALBUMIN 4.1   CBC:  Lab 01/20/12 0720 01/19/12 0946 01/19/12 0848  WBC 11.1* -- 13.8*  NEUTROABS -- -- 11.3*  HGB 13.0 13.3 --  HCT 38.4 39.0 --  MCV 94.8 -- 93.7  PLT 259 -- 319   Cardiac Enzymes:  Lab 01/19/12 0848  CKTOTAL 208*  CKMB 3.4  CKMBINDEX --  TROPONINI <0.30   CBG:  Lab 01/19/12 0801  GLUCAP 129*   Hemoglobin A1C:  Lab 01/20/12 0720    HGBA1C 5.6   Fasting Lipid Panel:  Lab 01/20/12 0720  CHOL 224*  HDL 41  LDLCALC 134*  TRIG 247*  CHOLHDL 5.5  LDLDIRECT --   Coagulation:  Lab 01/19/12 0848  LABPROT 13.0  INR 0.96   Urine Drug Screen: Drugs of Abuse     Component Value Date/Time   LABOPIA NONE DETECTED 01/19/2012 0953   COCAINSCRNUR NONE DETECTED 01/19/2012 0953   LABBENZ NONE DETECTED 01/19/2012 0953   AMPHETMU NONE DETECTED 01/19/2012 0953   THCU NONE DETECTED 01/19/2012 0953   LABBARB NONE DETECTED 01/19/2012 0953    Medications: I have reviewed the patient's current medications. Scheduled Meds:   . amLODipine  2.5 mg Oral Daily  . aspirin EC  81 mg Oral Daily  . enoxaparin  40 mg Subcutaneous Q24H  . escitalopram  20 mg Oral Daily  . LORazepam  1 mg Intravenous Once  . naproxen  500 mg Oral Q12H  . simvastatin  20 mg Oral q1800  . sodium chloride  3 mL Intravenous Q12H  . DISCONTD: aspirin  300 mg Rectal Daily  . DISCONTD: aspirin  325 mg Oral Daily   Continuous Infusions:  PRN Meds:.sodium  chloride, acetaminophen, acetaminophen, loperamide, ondansetron (ZOFRAN) IV, senna-docusate, sodium chloride, traMADol, DISCONTD: traMADol    Assessment/Plan:  LOS: 2 days     Acute Infarct R Basal Ganglia: Seen on MRI, likely cause of her extremity weakness/heaviness L > R and her tongue heaviness. Her symptoms are nearly fully resolved this morning, with no weakness on exam. Given her young age and only having HTN and smoking in distant past as risk factors, less common causes of stroke, such as autoimmune disease or clotting disorder, were considered. HIV, RPR negative. ANA negative.  HbA1c wnl.  Passed bedside swallow, tongue heaviness resolved. 2D echo TTE with bubble study and carotid dopplers normal.    -Aspirin 81mg  PO daily for life  -TEE tomorrow -LDL 134, statin started will discharge on pravastatin  -Hypercoag panel, Lyme antibodies pending  HTN: Due to bradycardia, will start low dose  amlodipine in place of metoprolol.  Anxiety: Continue Lexapro. Discontinue Ritalin per stroke team rec.  OA: Continue tylenol and tramadol.  Discontinue naproxen per stroke team rec.  DVT Prophylaxis: Lovenox   Blanca Friend 01/21/2012, 3:36 PM

## 2012-01-22 ENCOUNTER — Encounter (HOSPITAL_COMMUNITY)
Admission: EM | Disposition: A | Discharge status: Home / Self Care | Payer: Self-pay | Source: Home / Self Care | Attending: Internal Medicine

## 2012-01-22 ENCOUNTER — Encounter (HOSPITAL_COMMUNITY): Payer: Self-pay

## 2012-01-22 DIAGNOSIS — G8929 Other chronic pain: Secondary | ICD-10-CM

## 2012-01-22 DIAGNOSIS — F411 Generalized anxiety disorder: Secondary | ICD-10-CM

## 2012-01-22 DIAGNOSIS — I6789 Other cerebrovascular disease: Secondary | ICD-10-CM

## 2012-01-22 DIAGNOSIS — I6340 Cerebral infarction due to embolism of unspecified cerebral artery: Secondary | ICD-10-CM

## 2012-01-22 DIAGNOSIS — I10 Essential (primary) hypertension: Secondary | ICD-10-CM

## 2012-01-22 HISTORY — PX: TEE WITHOUT CARDIOVERSION: SHX5443

## 2012-01-22 SURGERY — ECHOCARDIOGRAM, TRANSESOPHAGEAL
Anesthesia: Moderate Sedation

## 2012-01-22 MED ORDER — SODIUM CHLORIDE 0.9 % IJ SOLN: 3.0000 mL | Freq: Two times a day (BID) | INTRAMUSCULAR | Status: DC

## 2012-01-22 MED ORDER — ASPIRIN 81 MG PO CHEW
243.0000 mg | CHEWABLE_TABLET | Freq: Once | ORAL | Status: AC
  Administered 2012-01-22: 243 mg via ORAL
  Filled 2012-01-22: qty 3

## 2012-01-22 MED ORDER — PRAVASTATIN SODIUM 40 MG PO TABS: 40.0000 mg | ORAL_TABLET | Freq: Every day | ORAL | Status: DC

## 2012-01-22 MED ORDER — MIDAZOLAM HCL 10 MG/2ML IJ SOLN
INTRAMUSCULAR | Status: DC | PRN
  Administered 2012-01-22 (×4): 2 mg via INTRAVENOUS

## 2012-01-22 MED ORDER — BENZOCAINE 20 % MT SOLN
1.0000 "application " | OROMUCOSAL | Status: DC | PRN
  Filled 2012-01-22: qty 57

## 2012-01-22 MED ORDER — DIPHENHYDRAMINE HCL 50 MG/ML IJ SOLN
INTRAMUSCULAR | Status: AC
  Filled 2012-01-22: qty 1

## 2012-01-22 MED ORDER — MIDAZOLAM HCL 10 MG/2ML IJ SOLN
INTRAMUSCULAR | Status: AC
  Filled 2012-01-22: qty 4

## 2012-01-22 MED ORDER — SODIUM CHLORIDE 0.9 % IJ SOLN: 3.0000 mL | INTRAMUSCULAR | Status: DC | PRN

## 2012-01-22 MED ORDER — AMLODIPINE BESYLATE 2.5 MG PO TABS: 2.5000 mg | ORAL_TABLET | Freq: Every day | ORAL | Status: DC

## 2012-01-22 MED ORDER — SODIUM CHLORIDE 0.45 % IV SOLN: INTRAVENOUS | Status: DC

## 2012-01-22 MED ORDER — FENTANYL CITRATE 0.05 MG/ML IJ SOLN: 250.0000 ug | Freq: Once | INTRAMUSCULAR | Status: DC

## 2012-01-22 MED ORDER — SODIUM CHLORIDE 0.9 % IV SOLN: 250.0000 mL | INTRAVENOUS | Status: DC | PRN

## 2012-01-22 MED ORDER — FENTANYL CITRATE 0.05 MG/ML IJ SOLN
INTRAMUSCULAR | Status: DC | PRN
  Administered 2012-01-22 (×3): 25 ug via INTRAVENOUS

## 2012-01-22 MED ORDER — BUTAMBEN-TETRACAINE-BENZOCAINE 2-2-14 % EX AERO
INHALATION_SPRAY | CUTANEOUS | Status: DC | PRN
  Administered 2012-01-22: 2 via TOPICAL

## 2012-01-22 MED ORDER — FENTANYL CITRATE 0.05 MG/ML IJ SOLN
INTRAMUSCULAR | Status: AC
  Filled 2012-01-22: qty 4

## 2012-01-22 MED ORDER — MIDAZOLAM HCL 10 MG/2ML IJ SOLN: 10.0000 mg | Freq: Once | INTRAMUSCULAR | Status: DC

## 2012-01-22 MED ORDER — ASPIRIN 325 MG PO TABS: 325.0000 mg | ORAL_TABLET | Freq: Every day | ORAL | Status: AC

## 2012-01-22 MED ORDER — ASPIRIN 325 MG PO TABS: 325.0000 mg | ORAL_TABLET | Freq: Every day | ORAL | Status: DC

## 2012-01-22 NOTE — Interval H&P Note (Signed)
History and Physical Interval Note:  01/22/2012 1:06 PM  [name] [name]  has presented today for surgery, with the diagnosis of stroke  The various methods of treatment have been discussed with the patient and family. After consideration of risks, benefits and other options for treatment, the patient has consented to  Procedure(s) (LRB): TRANSESOPHAGEAL ECHOCARDIOGRAM (TEE) (N/A) as a surgical intervention .  The patients' history has been reviewed, patient examined, no change in status, stable for surgery.  I have reviewed the patients' chart and labs.  Questions were answered to the patient's satisfaction.     Olga Millers

## 2012-01-22 NOTE — H&P (View-Only) (Signed)
Stroke Team Progress Note  HISTORY 41 y.o. Female with HTN, ADHD, and anxiety, with bilateral hand and leg numbness on 01/18/12 PM, with "thick tongue". Called paramedic, with normal vitals, and waited out her symptoms. Next AM, noticed left arm/leg weakness and came to [facility]. Recent events: Friday AM 01/18/2012 had knee drainage and cortisone injection. Also donated blood.  She has no prior history of stroke or MI, no history of clotting disorder, multiple miscarriages or DVT. She also has no significant family history for such. She does not take oral contraceptives, nor does she smoke. She does have a history of hypertension. Patient was not a TPA candidate secondary to delay in arrival. She was admitted for further evaluation and treatment.  SUBJECTIVE No one is at the bedside.  Overall she feels her deficts are completely resolved. Has 4 girls, 2 oldest in school, 2 youngest at home. Back on Ritalin x 1 year.Also was on naprosyn twice daily x 3 months for right knee pain.  OBJECTIVE Most recent Vital Signs: Filed Vitals:   01/20/12 1729 01/20/12 2042 01/21/12 0252 01/21/12 0516  BP: 130/87 157/84 150/80 132/84  Pulse: 64 64 68 71  Temp: 98.3 F (36.8 C) 98 F (36.7 C) 98.6 F (37 C) 98.2 F (36.8 C)  TempSrc: Oral Oral Oral Oral  Resp: 18 18 18 18   Height:      Weight:      SpO2: 97% 96% 94% 96%   CBG (last 3)  Basename 01/19/12 0801  GLUCAP 129*   Intake/Output from previous day:   IV Fluid Intake:     MEDICATIONS    . aspirin  300 mg Rectal Daily   Or  . aspirin  325 mg Oral Daily  . enoxaparin  40 mg Subcutaneous Q24H  . escitalopram  20 mg Oral Daily  . LORazepam  1 mg Intravenous Once  . naproxen  500 mg Oral Q12H  . simvastatin  20 mg Oral q1800  . sodium chloride  3 mL Intravenous Q12H  . DISCONTD: methylphenidate  20 mg Oral Daily   PRN:  sodium chloride, acetaminophen, acetaminophen, loperamide, ondansetron (ZOFRAN) IV, senna-docusate, sodium chloride,  traMADol  Diet:  General thin liquids Activity:  As tolerated DVT Prophylaxis:  Lovenox 40 mg sq daily   CLINICALLY SIGNIFICANT STUDIES Basic Metabolic Panel:  Lab 01/19/12 1610 01/19/12 0848  NA 140 138  K 3.8 3.9  CL 107 102  CO2 -- 23  GLUCOSE 123* 120*  BUN 10 10  CREATININE 0.70 0.66  CALCIUM -- 9.0  MG -- --  PHOS -- --   Liver Function Tests:  Lab 01/19/12 0848  AST 17  ALT 23  ALKPHOS 68  BILITOT 0.2*  PROT 7.0  ALBUMIN 4.1   CBC:  Lab 01/20/12 0720 01/19/12 0946 01/19/12 0848  WBC 11.1* -- 13.8*  NEUTROABS -- -- 11.3*  HGB 13.0 13.3 --  HCT 38.4 39.0 --  MCV 94.8 -- 93.7  PLT 259 -- 319   Coagulation:  Lab 01/19/12 0848  LABPROT 13.0  INR 0.96   Cardiac Enzymes:  Lab 01/19/12 0848  CKTOTAL 208*  CKMB 3.4  CKMBINDEX --  TROPONINI <0.30   Lipid Panel    Component Value Date/Time   CHOL 224* 01/20/2012 0720   TRIG 247* 01/20/2012 0720   HDL 41 01/20/2012 0720   CHOLHDL 5.5 01/20/2012 0720   VLDL 49* 01/20/2012 0720   LDLCALC 134* 01/20/2012 0720   HgbA1C  Lab Results  Component  Value Date   HGBA1C 5.6 01/20/2012    Urine Drug Screen:     Component Value Date/Time   LABOPIA NONE DETECTED 01/19/2012 0953   COCAINSCRNUR NONE DETECTED 01/19/2012 0953   LABBENZ NONE DETECTED 01/19/2012 0953   AMPHETMU NONE DETECTED 01/19/2012 0953   THCU NONE DETECTED 01/19/2012 0953   LABBARB NONE DETECTED 01/19/2012 0953    Alcohol Level: No results found for this basename: ETH:2 in the last 168 hours  ANA in process RPR neg HIV neg B. burgdorfi antibodies in process  CT of the brain  01/19/2012   1.  No acute intracranial abnormalities. 2.  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 3.  Status post bilateral maxillary antrectomy.  There is complete opacification of the left frontoethmoidal recess, which may represent a small mucocele.    MRI of the brain  01/19/2012  Acute infarct right basal ganglia.  MRA of the brain  01/19/2012 no large  vessel stenosis  2D Echocardiogram  EF 60-65%; no source of embolus.   Carotid Doppler  No internal carotid artery stenosis bilaterally. Vertebrals with antegrade flow bilaterally.   CXR    EKG  normal sinus rhythm, nonspecific ST and T waves changes.   Therapy Recommendations PT -none, OT -none  GENERAL EXAM:  Patient is in no distress  CARDIOVASCULAR:  Regular rate and rhythm, no murmurs, no carotid bruits  NEUROLOGIC:  MENTAL STATUS: awake, alert, language fluent, comprehension intact, naming intact  CRANIAL NERVE: pupils equal and reactive to light, visual fields full to confrontation, extraocular muscles intact, no nystagmus, facial sensation and strength symmetric, uvula midline, shoulder shrug symmetric, tongue midline.  MOTOR: normal bulk and tone, full strength in the BUE, BLE Orbits right over left upper extremity SENSORY: normal and symmetric to light touch  COORDINATION: DECREASED FFM IN LUE.  REFLEXES: deep tendon reflexes present and symmetric  GAIT/STATION: narrow based gait.  ASSESSMENT [name] [name] is a 41 y.o. 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 While it looks like small vessel disease, etiology is unclear. etiologies in a 40 yo also includes vasculitis, HIV, Lyme`s, sarcoid, PFO/VTE, Sjogrens,  etc. .  On no antiplatelet prior to admission. Now on aspirin 325 mg orally every day for secondary stroke prevention. Patient with no resultant symptoms.  -hypertension -hyperlipidemia, LDL 134 on statin -obesity, Body mass index is 36.62 kg/(m^2). -on Ritalin x 1 year. It can cause vasospasm. -high caffeine intake -on naproxen prior to admission for knee pain. NSAIDS have been associated with strokes in young. Tramadol is ok.  Hospital day # 2  TREATMENT/PLAN -Continue aspirin 325 mg orally every day for secondary stroke prevention. - Recommend she not take Ritalin due to vasospasm side effects; follow up with primary  MD   for alternative nonstimulants -decrease caffeine intake to less than 2 cups/day -stop naproxen due to associated cardiac/stroke risk from NSAID long term use. -recommend TEE to look for embolic source. If positive for PFO (patent foramen ovale), needs bilateral lower extremity dopplers to rule out DVT as source of stroke. -hypercoagulable panel (Minus factor V & Beta-2-glycoprotein, both associated with venous not arterial infarcts). Consider  C3,C4, CH50 labs if ANA positive -D/W medical team and Dr Trevor Iha, AVNP, ANP-BC, GNP-BC [facility] Pager: [phone] 01/21/2012 7:45 AM  Scribe for Dr. Delia Heady, Stroke Center Medical Director. He has personally reviewed chart, pertinent data, examined the patient and developed the plan of care.  Pager:  [phone]

## 2012-01-22 NOTE — Progress Notes (Signed)
Stroke Team Progress Note  HISTORY 41 y.o. Female with HTN, ADHD, and anxiety, with bilateral hand and leg numbness on 01/18/12 PM, with "thick tongue". Called paramedic, with normal vitals, and waited out her symptoms. Next AM, noticed left arm/leg weakness and came to [facility]. Recent events: Friday AM 01/18/2012 had knee drainage and cortisone injection. Also donated blood.  She has no prior history of stroke or MI, no history of clotting disorder, multiple miscarriages or DVT. She also has no significant family history for such. She does not take oral contraceptives, nor does she smoke. She does have a history of hypertension. Patient was not a TPA candidate secondary to delay in arrival. She was admitted for further evaluation and treatment.  SUBJECTIVE Patient up in the halls, awaiting her TEE.  OBJECTIVE Most recent Vital Signs: Filed Vitals:   01/21/12 2200 01/22/12 0200 01/22/12 0600 01/22/12 0946  BP: 134/89 139/88 143/89 127/87  Pulse: 72 67 68 90  Temp: 97.9 F (36.6 C) 98.5 F (36.9 C) 98.1 F (36.7 C) 98.3 F (36.8 C)  TempSrc: Oral Oral Oral Oral  Resp: 18 19 18 18   Height:      Weight:      SpO2: 97% 97% 97% 97%   CBG (last 3) No results found for this basename: GLUCAP:3 in the last 72 hours Intake/Output from previous day: 05/20 0701 - 05/21 0700 In: 240 [P.O.:240] Out: -  IV Fluid Intake:     MEDICATIONS    . amLODipine  2.5 mg Oral Daily  . aspirin EC  81 mg Oral Daily  . enoxaparin  40 mg Subcutaneous Q24H  . escitalopram  20 mg Oral Daily  . naproxen  500 mg Oral Q12H  . simvastatin  20 mg Oral q1800  . sodium chloride  3 mL Intravenous Q12H  . DISCONTD: aspirin  300 mg Rectal Daily  . DISCONTD: aspirin  325 mg Oral Daily   PRN:  sodium chloride, acetaminophen, acetaminophen, loperamide, LORazepam, LORazepam, ondansetron (ZOFRAN) IV, senna-docusate, sodium chloride, traMADol, DISCONTD: traMADol  Diet:  NPO for TEE Activity:  As tolerated DVT  Prophylaxis:  Lovenox 40 mg sq daily   CLINICALLY SIGNIFICANT STUDIES Basic Metabolic Panel:  Lab 01/19/12 1610 01/19/12 0848  NA 140 138  K 3.8 3.9  CL 107 102  CO2 -- 23  GLUCOSE 123* 120*  BUN 10 10  CREATININE 0.70 0.66  CALCIUM -- 9.0  MG -- --  PHOS -- --   Liver Function Tests:  Lab 01/19/12 0848  AST 17  ALT 23  ALKPHOS 68  BILITOT 0.2*  PROT 7.0  ALBUMIN 4.1   CBC:  Lab 01/20/12 0720 01/19/12 0946 01/19/12 0848  WBC 11.1* -- 13.8*  NEUTROABS -- -- 11.3*  HGB 13.0 13.3 --  HCT 38.4 39.0 --  MCV 94.8 -- 93.7  PLT 259 -- 319   Coagulation:  Lab 01/19/12 0848  LABPROT 13.0  INR 0.96   Cardiac Enzymes:  Lab 01/19/12 0848  CKTOTAL 208*  CKMB 3.4  CKMBINDEX --  TROPONINI <0.30   Lipid Panel    Component Value Date/Time   CHOL 224* 01/20/2012 0720   TRIG 247* 01/20/2012 0720   HDL 41 01/20/2012 0720   CHOLHDL 5.5 01/20/2012 0720   VLDL 49* 01/20/2012 0720   LDLCALC 134* 01/20/2012 0720   HgbA1C  Lab Results  Component Value Date   HGBA1C 5.6 01/20/2012   Urine Drug Screen:     Component Value Date/Time  LABOPIA NONE DETECTED 01/19/2012 0953   COCAINSCRNUR NONE DETECTED 01/19/2012 0953   LABBENZ NONE DETECTED 01/19/2012 0953   AMPHETMU NONE DETECTED 01/19/2012 0953   THCU NONE DETECTED 01/19/2012 0953   LABBARB NONE DETECTED 01/19/2012 0953    Alcohol Level: No results found for this basename: ETH:2 in the last 168 hours  ANTI III 119 ANA  normal RPR neg HIV neg B. burgdorfi antibodies in process Hypercoagulable panel in process  CT of the brain  01/19/2012   1.  No acute intracranial abnormalities. 2.  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 3.  Status post bilateral maxillary antrectomy.  There is complete opacification of the left frontoethmoidal recess, which may represent a small mucocele.    MRI of the brain  01/19/2012  Acute infarct right basal ganglia.  MRA of the brain  01/19/2012 no large vessel stenosis  2D  Echocardiogram  EF 60-65%; no source of embolus.   Carotid Doppler  No internal carotid artery stenosis bilaterally. Vertebrals with antegrade flow bilaterally.   CXR    EKG  normal sinus rhythm, nonspecific ST and T waves changes.   Therapy Recommendations PT -none, OT -none  GENERAL EXAM:  Patient is in no distress  CARDIOVASCULAR:  Regular rate and rhythm, no murmurs, no carotid bruits  NEUROLOGIC:  MENTAL STATUS: awake, alert, language fluent, comprehension intact, naming intact  CRANIAL NERVE: pupils equal and reactive to light, visual fields full to confrontation, extraocular muscles intact, no nystagmus, facial sensation and strength symmetric, uvula midline, shoulder shrug symmetric, tongue midline.  MOTOR: normal bulk and tone, full strength in the BUE, BLE Orbits right over left upper extremity SENSORY: normal and symmetric to light touch  COORDINATION: DECREASED FFM IN LUE.  REFLEXES: deep tendon reflexes present and symmetric  GAIT/STATION: narrow based gait.  ASSESSMENT [name] is a 41 y.o. 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 While it looks like small vessel disease, etiology is unclear. Etiologies in a 40 yo also includes vasculitis, HIV, Lyme`s, sarcoid, PFO/VTE, Sjogrens,  etc.  On no antiplatelet prior to admission. Now on aspirin 325 mg orally every day for secondary stroke prevention. Patient with no resultant neurologic symptoms.  -hypertension -hyperlipidemia, LDL 134 on statin -obesity, Body mass index is 36.62 kg/(m^2). -on Ritalin x 1 year. It can cause vasospasm. -high caffeine intake -on naproxen prior to admission for knee pain. NSAIDS have been associated with strokes in young. Tramadol is ok. -depression, on lexapro -?paranoia. Per RN, pt refused to eat breakfast or lunch because her cholesterol is too high  Hospital day # 3  TREATMENT/PLAN - Prefer aspirin 325 mg orally every day for secondary  stroke prevention. - follow up with primary MD  for alternative nonstimulants - decrease caffeine intake to less than 2 cups/day - TEE today to look for embolic source. If positive for PFO (patent foramen ovale), needs bilateral lower extremity dopplers to rule out DVT as source of stroke. - ok for discharge after TEE from neuro perspective - follow up Dr. Pearlean Brownie in 2 mos. - Discussed aspirin dosing with medical team  Annie Main, AVNP, ANP-BC, GNP-BC [facility] Pager: [phone] 01/22/2012 10:51 AM  Scribe for Dr. Delia Heady, Stroke Center Medical Director. He has personally reviewed chart, pertinent data, examined the patient and developed the plan of care. Pager:  [phone]

## 2012-01-22 NOTE — Progress Notes (Signed)
  Echocardiogram Echocardiogram Transesophageal has been performed.  Jorje Guild [facility] 01/22/2012, 1:55 PM

## 2012-01-22 NOTE — CV Procedure (Signed)
See results in [employer]; no SOE identified. [name] [name]

## 2012-01-22 NOTE — Progress Notes (Signed)
Subjective:  Patient reports that her L limb weakness/heaviness continue to be completely resolved today. She is awaiting TEE today.    Objective: Vital signs in last 24 hours: Filed Vitals:   01/21/12 1800 01/21/12 2200 01/22/12 0200 01/22/12 0600  BP: 145/93 134/89 139/88 143/89  Pulse: 74 72 67 68  Temp: 97.8 F (36.6 C) 97.9 F (36.6 C) 98.5 F (36.9 C) 98.1 F (36.7 C)  TempSrc: Oral Oral Oral Oral  Resp: 18 18 19 18   Height:      Weight:      SpO2: 92% 97% 97% 97%    General: alert, well-developed, and cooperative to examination.   Head: normocephalic and atraumatic.   Lungs: normal respiratory effort, no accessory muscle use, normal breath sounds, no crackles, and no wheezes.   Heart: normal rate, regular rhythm, no murmur, no gallop, and no rub.   Neurologic: alert & oriented X3, cranial nerves II-XII intact, Sensation fully intact to light touch in all 4 extremities. Strength 5/5 in all extremities and symmetric.       Lab Results: Basic Metabolic Panel:  Lab 01/19/12 0960 01/19/12 0848  NA 140 138  K 3.8 3.9  CL 107 102  CO2 -- 23  GLUCOSE 123* 120*  BUN 10 10  CREATININE 0.70 0.66  CALCIUM -- 9.0  MG -- --  PHOS -- --   Liver Function Tests:  Lab 01/19/12 0848  AST 17  ALT 23  ALKPHOS 68  BILITOT 0.2*  PROT 7.0  ALBUMIN 4.1   CBC:  Lab 01/20/12 0720 01/19/12 0946 01/19/12 0848  WBC 11.1* -- 13.8*  NEUTROABS -- -- 11.3*  HGB 13.0 13.3 --  HCT 38.4 39.0 --  MCV 94.8 -- 93.7  PLT 259 -- 319   Cardiac Enzymes:  Lab 01/19/12 0848  CKTOTAL 208*  CKMB 3.4  CKMBINDEX --  TROPONINI <0.30   CBG:  Lab 01/19/12 0801  GLUCAP 129*   Hemoglobin A1C:  Lab 01/20/12 0720  HGBA1C 5.6   Fasting Lipid Panel:  Lab 01/20/12 0720  CHOL 224*  HDL 41  LDLCALC 134*  TRIG 247*  CHOLHDL 5.5  LDLDIRECT --   Coagulation:  Lab 01/19/12 0848  LABPROT 13.0  INR 0.96   Urine Drug Screen: Drugs of Abuse     Component Value Date/Time    LABOPIA NONE DETECTED 01/19/2012 0953   COCAINSCRNUR NONE DETECTED 01/19/2012 0953   LABBENZ NONE DETECTED 01/19/2012 0953   AMPHETMU NONE DETECTED 01/19/2012 0953   THCU NONE DETECTED 01/19/2012 0953   LABBARB NONE DETECTED 01/19/2012 0953    Medications: I have reviewed the patient's current medications. Scheduled Meds:   . amLODipine  2.5 mg Oral Daily  . aspirin EC  81 mg Oral Daily  . enoxaparin  40 mg Subcutaneous Q24H  . escitalopram  20 mg Oral Daily  . naproxen  500 mg Oral Q12H  . simvastatin  20 mg Oral q1800  . sodium chloride  3 mL Intravenous Q12H  . DISCONTD: aspirin  300 mg Rectal Daily  . DISCONTD: aspirin  325 mg Oral Daily   Continuous Infusions:  PRN Meds:.sodium chloride, acetaminophen, acetaminophen, loperamide, LORazepam, LORazepam, ondansetron (ZOFRAN) IV, senna-docusate, sodium chloride, traMADol, DISCONTD: traMADol    Assessment/Plan:   Acute Infarct R Basal Ganglia: Seen on MRI, likely cause of her extremity weakness/heaviness L > R and her tongue heaviness. Her symptoms have been fully resolved for two days. Given her young age and only having HTN and smoking  in distant past as risk factors, less common causes of stroke, such as autoimmune disease or clotting disorder, were considered. HIV, RPR negative. ANA negative. HbA1c wnl. Passed bedside swallow, tongue heaviness resolved. 2D echo TTE with bubble study and carotid dopplers normal.  -Aspirin 81mg  PO daily for life  -TEE today, if normal will discharge late this afternoon -LDL 134, statin started will discharge on pravastatin  -Hypercoag panel, Lyme antibodies pending   HTN: Due to bradycardia, started low dose amlodipine in place of metoprolol. BPs not much changed.  PCP can titrate this up as needed as outpatient  Anxiety: Continue Lexapro. Discontinue Ritalin per stroke team rec.   OA: Continue tylenol and tramadol. Discontinue naproxen per stroke team rec.   DVT Prophylaxis: Lovenox      LOS: 3 days   Yaakov Guthrie, BRAD 01/22/2012, 9:11 AM

## 2012-01-23 ENCOUNTER — Encounter (HOSPITAL_COMMUNITY): Payer: Self-pay | Admitting: Cardiology

## 2012-01-23 LAB — PROTEIN S ACTIVITY: Protein S Activity: 106 % (ref 69–129)

## 2012-01-23 LAB — HOMOCYSTEINE: Homocysteine: 9 umol/L (ref 4.0–15.4)

## 2012-01-23 LAB — LUPUS ANTICOAGULANT PANEL: DRVVT: 37.8 secs (ref <45.1)

## 2012-01-23 NOTE — Care Management Note (Signed)
    Page 1 of 1   01/23/2012     9:34:38 AM   CARE MANAGEMENT NOTE 01/23/2012  Patient:  [name] [name], [name] [name]   Account Number:  [id number]  Date Initiated:  01/21/2012  Documentation initiated by:  [facility]  Subjective/Objective Assessment:   Admitted with CVA.Lives with spouse.     Action/Plan:   PT eval-no d/c needs  OT eval-no d/c needs   Anticipated DC Date:  01/22/2012   Anticipated DC Plan:  HOME/SELF CARE      DC Planning Services  CM consult      Choice offered to / List presented to:             Status of service:  Completed, signed off Medicare Important Message given?   (If response is "NO", the following Medicare IM given date fields will be blank) Date Medicare IM given:   Date Additional Medicare IM given:    Discharge Disposition:  HOME/SELF CARE  Per UR Regulation:  Reviewed for med. necessity/level of care/duration of stay  If discussed at Long Length of Stay Meetings, dates discussed:    Comments:

## 2012-01-25 LAB — CARDIOLIPIN ANTIBODIES, IGG, IGM, IGA
Anticardiolipin IgA: 4 APL U/mL — ABNORMAL LOW (ref <22)
Anticardiolipin IgG: 1 GPL U/mL — ABNORMAL LOW (ref <23)
Anticardiolipin IgM: 2 MPL U/mL — ABNORMAL LOW (ref <11)

## 2012-01-25 LAB — BETA-2-GLYCOPROTEIN I ABS, IGG/M/A
Beta-2 Glyco I IgG: 2 G Units (ref <20)
Beta-2-Glycoprotein I IgA: 3 A Units (ref <20)

## 2012-01-27 NOTE — Discharge Summary (Signed)
Internal Medicine Teaching [facility] Discharge Note  Name: [name] [name] MRN: [id number] DOB: 1971-04-12 41 y.o.  Date of Admission: 01/19/2012  7:58 AM Date of Discharge: 01/22/2012 Attending Physician: Dr. Blanch Media  Discharge Diagnosis:  Acute ischemic stroke  Hypertension Anxiety Chronic knee pain   Discharge Medications: Medication List  As of 01/27/2012  7:20 AM   STOP taking these medications         methylphenidate 20 MG tablet      metoprolol succinate 50 MG 24 hr tablet      naproxen 500 MG tablet         TAKE these medications         amLODipine 2.5 MG tablet   Commonly known as: NORVASC   Take 1 tablet (2.5 mg total) by mouth daily.      aspirin 325 MG tablet   Take 1 tablet (325 mg total) by mouth daily.      escitalopram 20 MG tablet   Commonly known as: LEXAPRO   Take 20 mg by mouth daily.      loperamide 2 MG capsule   Commonly known as: IMODIUM   Take 2 mg by mouth daily as needed. For diarrhea      pravastatin 40 MG tablet   Commonly known as: PRAVACHOL   Take 1 tablet (40 mg total) by mouth daily.      traMADol 100 MG 24 hr tablet   Commonly known as: ULTRAM-ER   Take 100 mg by mouth daily as needed. For pain            Disposition and follow-up:   [name] [name] was discharged from [facility] in Stable condition.  At the hospital follow up visit please address blood pressure control as her blood pressure medicine was switched from metoprolol to amlodipine due to bradycardia.    Follow-up Appointments: Follow-up Information    Follow up with Gates Rigg, MD. Schedule an appointment as soon as possible for a visit in 2 months.   Contact information:   [address] [facility] [place] [address] [phone]       Follow up with Angelica Chessman., MD on 02/05/2012. (9:40am)    Contact information:   [phone] [place]ure[place] Josephs. [address] [place]  [place] [address] [phone]         Discharge Orders    Future Orders Please Complete By Expires   Activity as tolerated - No restrictions      Call MD for:  persistant dizziness or light-headedness      Call MD for:  difficulty breathing, headache or visual disturbances      Call MD for:  persistant nausea and vomiting         Consultations:  Neurology  Procedures Performed:  Ct Head Wo Contrast  01/19/2012  *RADIOLOGY REPORT*  Clinical Data: Heaviness in both arms and legs.  Possible stroke like symptoms.  CT HEAD WITHOUT CONTRAST  Technique:  Contiguous axial images were obtained from the base of the skull through the vertex without contrast.  Comparison: No priors.  Findings: There is a well-defined focus of low attenuation in the right putamen, compatible with an old lacunar infarction.  No definite acute intracranial abnormalities.  Specifically, no definitive evidence to suggest acute/subacute cerebral ischemia, no hemorrhage, no focal mass, mass effect, hydrocephalus or abnormal intra or extra-axial fluid collections.  No acute displaced skull fractures are identified.  Visualized paranasal sinuses and  mastoids are well pneumatized, with exception of complete opacification of the left frontoethmoidal recess.  Postoperative changes of bilateral maxillary antrectomy are noted.  IMPRESSION: 1.  No acute intracranial abnormalities. 2.  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 3.  Status post bilateral maxillary antrectomy.  There is complete opacification of the left frontoethmoidal recess, which may represent a small mucocele.  Original Report Authenticated By: Florencia Reasons, M.D.   Mr Maxine Glenn Head Wo Contrast  01/19/2012  *RADIOLOGY REPORT*  Clinical Data:  Left-sided weakness.  MRI HEAD WITHOUT CONTRAST MRA HEAD WITHOUT CONTRAST  Technique:  Multiplanar, multiecho pulse sequences of the brain and surrounding structures were obtained without intravenous  contrast. Angiographic images of the head were obtained using MRA technique without contrast.  Comparison:  CT head 01/19/2012  MRI HEAD  Findings:  Acute infarct involving the right basal ganglia.  This is primarily in the putamen extending into the tail of the caudate. There may also be some involvement of the posterior limb internal capsule.  No other acute infarct.  No significant chronic ischemic changes.  Negative for hemorrhage or mass.  Ventricle size is normal.  Brainstem is normal.  IMPRESSION: Acute infarct right basal ganglia.  MRA HEAD  Findings: Left vertebral artery is patent to the basilar.  Right vertebral artery is hypoplastic after supplying pica.  Basilar is widely patent.  Superior cerebellar and posterior cerebral arteries are patent.  Prominent left AICA is present supplying the  AICA and PICA territory.  Internal carotid artery is patent bilaterally without stenosis. Anterior and middle cerebral arteries are patent bilaterally. Negative for cerebral aneurysm.  IMPRESSION: Negative  Original Report Authenticated By: Camelia Phenes, M.D.   Mr Brain Wo Contrast  01/19/2012  *RADIOLOGY REPORT*  Clinical Data:  Left-sided weakness.  MRI HEAD WITHOUT CONTRAST MRA HEAD WITHOUT CONTRAST  Technique:  Multiplanar, multiecho pulse sequences of the brain and surrounding structures were obtained without intravenous contrast. Angiographic images of the head were obtained using MRA technique without contrast.  Comparison:  CT head 01/19/2012  MRI HEAD  Findings:  Acute infarct involving the right basal ganglia.  This is primarily in the putamen extending into the tail of the caudate. There may also be some involvement of the posterior limb internal capsule.  No other acute infarct.  No significant chronic ischemic changes.  Negative for hemorrhage or mass.  Ventricle size is normal.  Brainstem is normal.  IMPRESSION: Acute infarct right basal ganglia.  MRA HEAD  Findings: Left vertebral artery is patent  to the basilar.  Right vertebral artery is hypoplastic after supplying pica.  Basilar is widely patent.  Superior cerebellar and posterior cerebral arteries are patent.  Prominent left AICA is present supplying the  AICA and PICA territory.  Internal carotid artery is patent bilaterally without stenosis. Anterior and middle cerebral arteries are patent bilaterally. Negative for cerebral aneurysm.  IMPRESSION: Negative  Original Report Authenticated By: Camelia Phenes, M.D.    2D Echo:  Transthoracic with bubble study: Study Conclusions  - Left ventricle: The cavity size was normal. Wall thickness was normal. Systolic function was normal. The estimated ejection fraction was in the range of 60% to 65%. Wall motion was normal; there were no regional wall motion abnormalities. Left ventricular diastolic function parameters were normal. - Left atrium: No evidence of thrombus. - Atrial septum: No defect or patent foramen ovale was identified.    Transesophageal Echocardiogram: Study Conclusions  - Left ventricle: Systolic function  was normal. The estimated ejection fraction was in the range of 55% to 60%. Wall motion was normal; there were no regional wall motion abnormalities. - Aortic valve: Trivial regurgitation. - Left atrium: No evidence of thrombus in the atrial cavity or appendage. - Atrial septum: No defect or patent foramen ovale was identified. Agitated saline contrast study showed no bidirectional atrial level shunt, at baseline or with provocation.   Admission HPI:  [name] [name] is a 41 year old woman with hypertension, anxiety, and tobacco use in the distant past who presented to the hospital this morning due to weakness and heaviness in all four extremities, L side > R side. She first noticed trouble last night at 8pm. Both arms and legs felt heavy and warm. She could move her fingers and toes, but she could not lift her arms in bed and she could move her legs well enough  to get them over the side of the bed. She called her paramedic friend who found stable vital signs and felt it was likely an allergic reaction. She took 50mg  Benadryl and went to sleep.  She awoke in the early morning and felt stronger but still not herself. She was able to walk and use the bathroom and then went back to sleep. This morning, she woke up with weakness worse than in the early morning, but not as bad as it was at its worst ~8pm last night. This morning her weakness is much worse in left arm and leg than on right side. She also reports some tongue heaviness and her husband said her voice sounds a bit different. All of these symptoms have partially resolved since her arrival at the ED, but all still remain to some degree.  She has at no point had any decreased sensation anywhere, dizzyness, or headache. No visual field deficits, no vision changes.  No leg pain or swelling, no recent car rides. No family history of autoimmune disease, strokes, or blood clots. She smoked in past but quit 10 yrs ago.  No dysuria, hematuria, pain anywhere, chest pain, SOB, diarrhea, constipation, nausea, or vomiting.    Hospital Course by problem list:  Acute ischemic stroke: Acute basal ganglia infarct seen on imaging.  Patient had L sided weakness/heaviness and tongue heaviness affecting speech on day of admission.  All of these fully resolved by discharge and she was back to her normal self.  Due to her young age and lack of risk factors, she underwent extensive workup.  ANA, Lyme Dz titer, RPR, HIV antibodies all negative or within normal limits.  Cardiolipin abs, Factor 5 leiden, Homocysteine, Beta-2-glycoprotein, Lupus anticoagulant, Antithrombin III all within normal limits.  Protein S normal.  Protein C total normal, Protein C activity high, which would not support protein C deficiency as a diagnosis.  Overall hypercoag panel negative.  Transthoracic echocardiogram with bubble study and transesophageal  echocardiogram showed no valvular vegetations, thrombus, or atrial septal defect.    LDL 134, so statin started in this patient with a stroke.  Normal statin monitoring labs should be done periodically by her PCP.  Aspirin 325mg  daily was also started for secondary prevention of another stroke.   Aspirin therapy should be lifelong in this patient as long as she does not have any adverse reactions to aspirin in future.    Hypertension: Patient on metoprolol on admission.  This was held for first 24 hours for permissive HTN in setting of stroke.  Despite being off beta blocker, patient consistently had heart rates  in 50s (sinus bradycardia).  As a result, amlodipine was started in place of metoprolol on discharge.  Was started at a low dose.  PCP should titrate dose up as needed.     Discharge Vitals:  BP 117/82  Pulse 87  Temp(Src) 98.6 F (37 C) (Oral)  Resp 14  Ht 5' 0.4" (1.534 m)  Wt 190 lb (86.183 kg)  BMI 36.62 kg/m2  SpO2 96%  LMP 01/11/2012  Discharge Labs:  Admission on 01/19/2012, Discharged on 01/22/2012  Component Date Value Range Status  . Glucose-Capillary (mg/dL) 16/06/9603 540* 98-11 Final  . Prothrombin Time (seconds) 01/19/2012 13.0  11.6-15.2 Final  . INR  01/19/2012 0.96  0.00-1.49 Final  . aPTT (seconds) 01/19/2012 23* 24-37 Final  . WBC (K/uL) 01/19/2012 13.8* 4.0-10.5 Final  . RBC (MIL/uL) 01/19/2012 4.13  3.87-5.11 Final  . Hemoglobin (g/dL) 91/47/8295 62.1  30.8-65.7 Final  . HCT (%) 01/19/2012 38.7  36.0-46.0 Final  . MCV (fL) 01/19/2012 93.7  78.0-100.0 Final  . MCH (pg) 01/19/2012 32.4  26.0-34.0 Final  . MCHC (g/dL) 84/69/6295 28.4  13.2-44.0 Final  . RDW (%) 01/19/2012 12.7  11.5-15.5 Final  . Platelets (K/uL) 01/19/2012 319  150-400 Final  . Neutrophils Relative (%) 01/19/2012 82* 43-77 Final  . Neutro Abs (K/uL) 01/19/2012 11.3* 1.7-7.7 Final  . Lymphocytes Relative (%) 01/19/2012 12  12-46 Final  . Lymphs Abs (K/uL) 01/19/2012 1.6  0.7-4.0 Final    . Monocytes Relative (%) 01/19/2012 7  3-12 Final  . Monocytes Absolute (K/uL) 01/19/2012 0.9  0.1-1.0 Final  . Eosinophils Relative (%) 01/19/2012 0  0-5 Final  . Eosinophils Absolute (K/uL) 01/19/2012 0.0  0.0-0.7 Final  . Basophils Relative (%) 01/19/2012 0  0-1 Final  . Basophils Absolute (K/uL) 01/19/2012 0.0  0.0-0.1 Final  . Sodium (mEq/L) 01/19/2012 138  135-145 Final  . Potassium (mEq/L) 01/19/2012 3.9  3.5-5.1 Final  . Chloride (mEq/L) 01/19/2012 102  96-112 Final  . CO2 (mEq/L) 01/19/2012 23  19-32 Final  . Glucose, Bld (mg/dL) 06/30/2535 644* 03-47 Final  . BUN (mg/dL) 42/59/5638 10  7-56 Final  . Creatinine, Ser (mg/dL) 43/32/9518 8.41  6.60-6.30 Final  . Calcium (mg/dL) 16/09/930 9.0  3.5-57.3 Final  . Total Protein (g/dL) 22/10/5425 7.0  0.6-2.3 Final  . Albumin (g/dL) 76/28/3151 4.1  7.6-1.6 Final  . AST (U/L) 01/19/2012 17  0-37 Final  . ALT (U/L) 01/19/2012 23  0-35 Final  . Alkaline Phosphatase (U/L) 01/19/2012 68  39-117 Final  . Total Bilirubin (mg/dL) 07/37/1062 0.2* 6.9-4.8 Final  . GFR calc non Af Amer (mL/min) 01/19/2012 >90  >90 Final  . GFR calc Af Amer (mL/min) 01/19/2012 >90  >90 Final   Comment:                                 The eGFR has been calculated                          using the CKD EPI equation.                          This calculation has not been                          validated in all clinical  situations.                          eGFR's persistently                          <90 mL/min signify                          possible Chronic Kidney Disease.  . Total CK (U/L) 01/19/2012 208* 7-177 Final  . CK, MB (ng/mL) 01/19/2012 3.4  0.3-4.0 Final  . Relative Index  01/19/2012 1.6  0.0-2.5 Final  . Troponin I (ng/mL) 01/19/2012 <0.30  <0.30 Final   Comment:                                 Due to the release kinetics of cTnI,                          a negative result within the first hours                           of the onset of symptoms does not rule out                          myocardial infarction with certainty.                          If myocardial infarction is still suspected,                          repeat the test at appropriate intervals.  . Opiates  01/19/2012 NONE DETECTED  NONE DETECTED Final  . Cocaine  01/19/2012 NONE DETECTED  NONE DETECTED Final  . Benzodiazepines  01/19/2012 NONE DETECTED  NONE DETECTED Final  . Amphetamines  01/19/2012 NONE DETECTED  NONE DETECTED Final  . Tetrahydrocannabinol  01/19/2012 NONE DETECTED  NONE DETECTED Final  . Barbiturates  01/19/2012 NONE DETECTED  NONE DETECTED Final   Comment:                                 DRUG SCREEN FOR MEDICAL PURPOSES                          ONLY.  IF CONFIRMATION IS NEEDED                          FOR ANY PURPOSE, NOTIFY LAB                          WITHIN 5 DAYS.                                                          LOWEST DETECTABLE LIMITS  FOR URINE DRUG SCREEN                          Drug Class       Cutoff (ng/mL)                          Amphetamine      1000                          Barbiturate      200                          Benzodiazepine   200                          Tricyclics       300                          Opiates          300                          Cocaine          300                          THC              50  . Sodium (mEq/L) 01/19/2012 140  135-145 Final  . Potassium (mEq/L) 01/19/2012 3.8  3.5-5.1 Final  . Chloride (mEq/L) 01/19/2012 107  96-112 Final  . BUN (mg/dL) 16/06/9603 10  5-40 Final  . Creatinine, Ser (mg/dL) 98/07/9146 8.29  5.62-1.30 Final  . Glucose, Bld (mg/dL) 86/57/8469 629* 52-84 Final  . Calcium, Ion (mmol/L) 01/19/2012 1.11* 1.12-1.32 Final  . TCO2 (mmol/L) 01/19/2012 24  0-100 Final  . Hemoglobin (g/dL) 13/24/4010 27.2  53.6-64.4 Final  . HCT (%) 01/19/2012 39.0  36.0-46.0 Final  . Hemoglobin A1C (%) 01/20/2012 5.6  <5.7 Final    Comment: (NOTE)                                                                                                                         According to the ADA Clinical Practice Recommendations for 2011, when                          HbA1c is used as a screening test:                           >=6.5%   Diagnostic of Diabetes Mellitus                                    (  if abnormal result is confirmed)                          5.7-6.4%   Increased risk of developing Diabetes Mellitus                          References:Diagnosis and Classification of Diabetes Mellitus,Diabetes                          Care,2011,34(Suppl 1):S62-S69 and Standards of Medical Care in                                  Diabetes - 2011,Diabetes Care,2011,34 (Suppl 1):S11-S61.  . Mean Plasma Glucose (mg/dL) 16/06/9603 540  <981 Final  . RPR  01/19/2012 NON REACTIVE  NON REACTIVE Final  . HIV  01/19/2012 NON REACTIVE  NON REACTIVE Final  . WBC (K/uL) 01/20/2012 11.1* 4.0-10.5 Final  . RBC (MIL/uL) 01/20/2012 4.05  3.87-5.11 Final  . Hemoglobin (g/dL) 19/14/7829 56.2  13.0-86.5 Final  . HCT (%) 01/20/2012 38.4  36.0-46.0 Final  . MCV (fL) 01/20/2012 94.8  78.0-100.0 Final  . MCH (pg) 01/20/2012 32.1  26.0-34.0 Final  . MCHC (g/dL) 78/46/9629 52.8  41.3-24.4 Final  . RDW (%) 01/20/2012 12.6  11.5-15.5 Final  . Platelets (K/uL) 01/20/2012 259  150-400 Final  . B burgdorferi Ab IgG+IgM (ISR) 01/19/2012 0.18   Final   Comment: (NOTE)                          Antibody to Borrelia burgdorferi not detected.                            ISR = Immune Status Ratio                                      <= 0.90 ISR             Negative                                      0.91-1.09 ISR           Equivocal                                      >= 1.10 ISR             Positive  . Cholesterol (mg/dL) 09/05/7251 664* 4-034 Final  . Triglycerides (mg/dL) 74/25/9563 875* <643 Final  . HDL (mg/dL) 32/95/1884 41  >16 Final  . Total  CHOL/HDL Ratio (RATIO) 01/20/2012 5.5   Final  . VLDL (mg/dL) 60/63/0160 49* 1-09 Final  . LDL Cholesterol (mg/dL) 32/35/5732 202* 5-42 Final   Comment:                                 Total Cholesterol/HDL:CHD Risk  Coronary Heart Disease Risk Table                                              Men   Women                           1/2 Average Risk   3.4   3.3                           Average Risk       5.0   4.4                           2 X Average Risk   9.6   7.1                           3 X Average Risk  23.4   11.0                                                          Use the calculated Patient Ratio                          above and the CHD Risk Table                          to determine the patient's CHD Risk.                                                          ATP III CLASSIFICATION (LDL):                           <100     mg/dL   Optimal                           100-129  mg/dL   Near or Above                                             Optimal                           130-159  mg/dL   Borderline                           160-189  mg/dL   High                           >190     mg/dL   Very High  . ANA  01/20/2012 NEGATIVE  NEGATIVE Final  . AntiThromb III Func (%) 01/22/2012 119  75-120 Final  . Protein C Activity (%) 01/22/2012 185* 75-133 Final  . Protein C, Total (%) 01/22/2012 122  72-160 Final   Comment: (NOTE)                           Please note change in reference range(s).   . Protein S Activity (%) 01/22/2012 106  69-129 Final  . Protein S Ag, Total (%) 01/22/2012 102  60-150 Final  . PTT Lupus Anticoagulant (secs) 01/22/2012 30.5  28.0-43.0 Final  . PTTLA Confirmation (secs) 01/22/2012 NOT APPL  <8.0 Final  . PTTLA 4:1 Mix (secs) 01/22/2012 NOT APPL  28.0-43.0 Final  . Drvvt (secs) 01/22/2012 37.8  <45.1 Final  . Drvvt confirmation (Ratio) 01/22/2012 NOT APPL  <1.16 Final  . dRVVT Incubated 1:1 Mix (secs) 01/22/2012 NOT  APPL  <45.1 Final  . Lupus Anticoagulant  01/22/2012 NOT DETECTED  NOT DETECTED Final  . Beta-2 Glyco I IgG (G Units) 01/22/2012 2  <20 Final  . Beta-2-Glycoprotein I IgM (M Units) 01/22/2012 0  <20 Final  . Beta-2-Glycoprotein I IgA (A Units) 01/22/2012 3  <20 Final  . Homocysteine-Norm (umol/L) 01/22/2012 9.0  4.0-15.4 Final  . Recommendations-F5LEID:  01/22/2012 (NOTE)   Final   Comment:  NEGATIVE FOR FACTOR V MUTATION                           Reference Interval:                          Negative for Factor V Mutation                          Interpretation:                          Factor V Leiden Gene Mutation (G1691A) is the most common genetic risk                          factor for thrombosis and accounts for 94% of individuals classified                          as Activated Protein C (APC) resistant.  Testing for Factor V Leiden                          Mutation is recommended for all individuals with venous thrombosis or                          history of a thrombic event.                          Both heterozygotes and homozygotes are at risk for thrombosis, which                          is 5-10 fold, and 50-100 fold respectively.                          DNA testing for the V R2 polymorphism is recommended  for Factor V                          Leiden heterozygotes.  Presence of this polymorphism further increases                          the risk of venous thrombosis in Factor V Leiden heterozygotes.                          DNA from this patient was tested using a FDA approved assay for                           Factor V Leiden.  . Anticardiolipin IgG (GPL U/mL) 01/22/2012 1* <23 Final  . Anticardiolipin IgM (MPL U/mL) 01/22/2012 2* <11 Final  . Anticardiolipin IgA (APL U/mL) 01/22/2012 4* <22 Final   Comment: (NOTE)                          Reference Range:  Cardiolipin IgG                            Normal                  <23                            Low Positive  (+)        23-35                            Moderate Positive (+)   36-50                            High Positive (+)       >50                          Reference Range:  Cardiolipin IgM                            Normal                  <11                            Low Positive (+)        11-20                            Moderate Positive (+)   21-30                            High Positive (+)       >30                          Reference Range:  Cardiolipin IgA                            Normal                  <  22                            Low Positive (+)        22-35                            Moderate Positive (+)   36-45                            High Positive (+)       >45  ] SignedYaakov Guthrie, BRAD 01/27/2012, 7:20 AM   Time Spent on Discharge: 35 minutes

## 2012-04-27 ENCOUNTER — Emergency Department (HOSPITAL_COMMUNITY)
Admission: EM | Admit: 2012-04-27 | Discharge: 2012-04-27 | Disposition: A | Discharge status: Home / Self Care | Payer: BC Managed Care – PPO | Source: Home / Self Care | Attending: Emergency Medicine | Admitting: Emergency Medicine

## 2012-04-27 ENCOUNTER — Encounter (HOSPITAL_COMMUNITY): Payer: Self-pay

## 2012-04-27 DIAGNOSIS — M778 Other enthesopathies, not elsewhere classified: Secondary | ICD-10-CM

## 2012-04-27 DIAGNOSIS — M65839 Other synovitis and tenosynovitis, unspecified forearm: Secondary | ICD-10-CM

## 2012-04-27 MED ORDER — ACETAMINOPHEN-CODEINE #3 300-30 MG PO TABS
1.0000 | ORAL_TABLET | Freq: Four times a day (QID) | ORAL | Status: AC | PRN
Start: 2012-04-27 — End: 2012-05-07

## 2012-04-27 MED ORDER — CEPHALEXIN 500 MG PO CAPS: 500.0000 mg | ORAL_CAPSULE | Freq: Three times a day (TID) | ORAL | Status: DC

## 2012-04-27 MED ORDER — CEPHALEXIN 500 MG PO CAPS: 500.0000 mg | ORAL_CAPSULE | Freq: Three times a day (TID) | ORAL | Status: AC

## 2012-04-27 NOTE — ED Notes (Signed)
Pt worked in her yard yesterday and today has pain, swelling and bruising to rt inner arm that extends to her elbow.  No known injury and pt has bruise on lt hand from injury on Tuesday.  She states she had stroke in May and was put on several new medications.

## 2012-04-27 NOTE — ED Provider Notes (Signed)
History     CSN: [id number]  Arrival date & time 04/27/12  1143   First MD Initiated Contact with Patient 04/27/12 1148      Chief Complaint  Patient presents with  . Arm Injury    (Consider location/radiation/quality/duration/timing/severity/associated sxs/prior treatment) HPI Comments: Patient presented to urgent care today complaining of pain and swelling on the palmar side of her right forearm. She also has developed a papular-type rash on her left hand. She describes it to say she was working in her yard and manipulating a weed eater, while she was doing yard work. She feels tenderness, swelling puffiness all over her right forearm. It has a mildly itchy rash on both forearms. Her left forearm her wrist does not hurt. His only the right side it hurts. I have not had any injuries or falls, denies any numbness does describe a little bit of weakness when she tries to make a fist with her right hand.  Patient denies any other symptoms such as lower or upper extremity weakness, facial muscle paralysis symptoms, speech problems or any other symptoms.  Patient is a 41 y.o. female presenting with arm injury. The history is provided by the patient.  Arm Injury  The incident occurred yesterday. There is an injury to the right forearm. The pain is moderate. It is unlikely that a foreign body is present. Associated symptoms include focal weakness. Pertinent negatives include no numbness. She is right-handed. There were no sick contacts.    Past Medical History  Diagnosis Date  . Hypertension   . Anxiety   . Stroke   . Coronary artery disease     Past Surgical History  Procedure Date  . Cholecystectomy   . Hernia repair   . Knee surgery     right  . Carpal tunnel release     B/L hand  . Tee without cardioversion 01/22/2012    Procedure: TRANSESOPHAGEAL ECHOCARDIOGRAM (TEE);  Surgeon: Lewayne Bunting, MD;  Location: [facility] ENDOSCOPY;  Service: Cardiovascular;  Laterality: N/A;     History reviewed. No pertinent family history.  History  Substance Use Topics  . Smoking status: Never Smoker   . Smokeless tobacco: Not on file   Comment: quit 10 years ago, smoked intermittently for few years. Less than 10 yrs total.  . Alcohol Use: Yes     occ- 1-2 drinks a week    OB History    Grav Para Term Preterm Abortions TAB SAB Ect Mult Living                  Review of Systems  Constitutional: Positive for activity change. Negative for fever, chills, fatigue and unexpected weight change.  Skin: Positive for color change. Negative for pallor, rash and wound.  Neurological: Positive for focal weakness. Negative for numbness.    Allergies  Review of patient's allergies indicates no known allergies.  Home Medications   Current Outpatient Rx  Name Route Sig Dispense Refill  . ACETAMINOPHEN-CODEINE #3 300-30 MG PO TABS Oral Take 1-2 tablets by mouth every 6 (six) hours as needed for pain. 15 tablet 0  . AMLODIPINE BESYLATE 2.5 MG PO TABS Oral Take 1 tablet (2.5 mg total) by mouth daily. 30 tablet 1  . ASPIRIN 325 MG PO TABS Oral Take 1 tablet (325 mg total) by mouth daily.    . CEPHALEXIN 500 MG PO CAPS Oral Take 1 capsule (500 mg total) by mouth 3 (three) times daily. 21 capsule 0  . ESCITALOPRAM OXALATE  20 MG PO TABS Oral Take 20 mg by mouth daily.    Marland Kitchen LOPERAMIDE HCL 2 MG PO CAPS Oral Take 2 mg by mouth daily as needed. For diarrhea    . PRAVASTATIN SODIUM 40 MG PO TABS Oral Take 1 tablet (40 mg total) by mouth daily. 30 tablet 1  . TRAMADOL HCL ER 100 MG PO TB24 Oral Take 100 mg by mouth daily as needed. For pain      BP 136/90  Pulse 94  Temp 98.8 F (37.1 C) (Oral)  Resp 18  SpO2 98%  LMP 04/19/2012  Physical Exam  Nursing note and vitals reviewed. Constitutional: She appears well-developed and well-nourished.  Abdominal: Soft.  Musculoskeletal: She exhibits tenderness.       Arms: Neurological: She is alert.  Skin: No rash noted. No erythema.     ED Course  Procedures (including critical care time)  Labs Reviewed - No data to display No results found.   1. Tendinitis of right wrist       MDM   Tendosynovitis of her forearm. With an associated punctuate- papular and linear every team a sister with contact dermatitis. Have decided to prescribe patient with a Keflex short course of 7 days given erythema and tenderness as this could also be thrombophlebitis but have encouraged patient to use Ace wrap for the next 48 hours and if improvement is noted by Delaney Meigs no redness or tenderness are improved have told her not to take the Keflex. Patient was also prescribed Tylenol No. 3 for pain and discomfort.       Jimmie Molly, MD 04/27/12 [phone]

## 2012-09-03 DIAGNOSIS — I639 Cerebral infarction, unspecified: Secondary | ICD-10-CM

## 2012-09-03 HISTORY — DX: Cerebral infarction, unspecified: I63.9

## 2014-02-02 DIAGNOSIS — L4050 Arthropathic psoriasis, unspecified: Secondary | ICD-10-CM | POA: Insufficient documentation

## 2014-02-19 DIAGNOSIS — M5441 Lumbago with sciatica, right side: Secondary | ICD-10-CM | POA: Insufficient documentation

## 2014-02-23 DIAGNOSIS — F341 Dysthymic disorder: Secondary | ICD-10-CM | POA: Insufficient documentation

## 2014-02-23 DIAGNOSIS — Z79899 Other long term (current) drug therapy: Secondary | ICD-10-CM | POA: Insufficient documentation

## 2014-02-23 DIAGNOSIS — G5640 Causalgia of unspecified upper limb: Secondary | ICD-10-CM | POA: Insufficient documentation

## 2014-02-23 DIAGNOSIS — M1711 Unilateral primary osteoarthritis, right knee: Secondary | ICD-10-CM | POA: Insufficient documentation

## 2014-02-23 DIAGNOSIS — E782 Mixed hyperlipidemia: Secondary | ICD-10-CM | POA: Insufficient documentation

## 2014-02-23 DIAGNOSIS — D496 Neoplasm of unspecified behavior of brain: Secondary | ICD-10-CM | POA: Insufficient documentation

## 2014-02-23 DIAGNOSIS — M2392 Unspecified internal derangement of left knee: Secondary | ICD-10-CM | POA: Insufficient documentation

## 2014-02-23 DIAGNOSIS — M25579 Pain in unspecified ankle and joints of unspecified foot: Secondary | ICD-10-CM | POA: Insufficient documentation

## 2014-02-23 DIAGNOSIS — M1990 Unspecified osteoarthritis, unspecified site: Secondary | ICD-10-CM | POA: Insufficient documentation

## 2014-02-23 DIAGNOSIS — M25559 Pain in unspecified hip: Secondary | ICD-10-CM | POA: Insufficient documentation

## 2014-02-23 DIAGNOSIS — I10 Essential (primary) hypertension: Secondary | ICD-10-CM | POA: Insufficient documentation

## 2014-02-23 DIAGNOSIS — H9193 Unspecified hearing loss, bilateral: Secondary | ICD-10-CM | POA: Insufficient documentation

## 2014-02-23 DIAGNOSIS — M1712 Unilateral primary osteoarthritis, left knee: Secondary | ICD-10-CM | POA: Insufficient documentation

## 2014-02-23 DIAGNOSIS — S83004A Unspecified dislocation of right patella, initial encounter: Secondary | ICD-10-CM | POA: Insufficient documentation

## 2014-02-23 DIAGNOSIS — R799 Abnormal finding of blood chemistry, unspecified: Secondary | ICD-10-CM | POA: Insufficient documentation

## 2014-02-23 DIAGNOSIS — H9120 Sudden idiopathic hearing loss, unspecified ear: Secondary | ICD-10-CM | POA: Insufficient documentation

## 2014-02-23 DIAGNOSIS — M533 Sacrococcygeal disorders, not elsewhere classified: Secondary | ICD-10-CM | POA: Insufficient documentation

## 2014-02-23 DIAGNOSIS — R609 Edema, unspecified: Secondary | ICD-10-CM | POA: Insufficient documentation

## 2014-02-23 DIAGNOSIS — K589 Irritable bowel syndrome without diarrhea: Secondary | ICD-10-CM | POA: Insufficient documentation

## 2014-02-23 DIAGNOSIS — R030 Elevated blood-pressure reading, without diagnosis of hypertension: Secondary | ICD-10-CM | POA: Insufficient documentation

## 2014-02-23 DIAGNOSIS — M159 Polyosteoarthritis, unspecified: Secondary | ICD-10-CM | POA: Insufficient documentation

## 2014-02-23 DIAGNOSIS — D72829 Elevated white blood cell count, unspecified: Secondary | ICD-10-CM | POA: Insufficient documentation

## 2014-06-22 DIAGNOSIS — L509 Urticaria, unspecified: Secondary | ICD-10-CM | POA: Insufficient documentation

## 2014-07-17 ENCOUNTER — Encounter (HOSPITAL_COMMUNITY): Payer: Self-pay | Admitting: Emergency Medicine

## 2014-07-17 ENCOUNTER — Emergency Department (HOSPITAL_COMMUNITY)
Admission: EM | Admit: 2014-07-17 | Discharge: 2014-07-17 | Disposition: A | Discharge status: Home / Self Care | Payer: 59 | Attending: Emergency Medicine | Admitting: Emergency Medicine

## 2014-07-17 ENCOUNTER — Emergency Department (HOSPITAL_COMMUNITY): Payer: 59

## 2014-07-17 DIAGNOSIS — F419 Anxiety disorder, unspecified: Secondary | ICD-10-CM | POA: Diagnosis not present

## 2014-07-17 DIAGNOSIS — I639 Cerebral infarction, unspecified: Secondary | ICD-10-CM | POA: Diagnosis present

## 2014-07-17 DIAGNOSIS — R29810 Facial weakness: Secondary | ICD-10-CM

## 2014-07-17 DIAGNOSIS — R471 Dysarthria and anarthria: Secondary | ICD-10-CM

## 2014-07-17 DIAGNOSIS — I10 Essential (primary) hypertension: Secondary | ICD-10-CM | POA: Insufficient documentation

## 2014-07-17 DIAGNOSIS — F10129 Alcohol abuse with intoxication, unspecified: Secondary | ICD-10-CM | POA: Insufficient documentation

## 2014-07-17 DIAGNOSIS — F1010 Alcohol abuse, uncomplicated: Secondary | ICD-10-CM

## 2014-07-17 DIAGNOSIS — I2510 Atherosclerotic heart disease of native coronary artery without angina pectoris: Secondary | ICD-10-CM | POA: Insufficient documentation

## 2014-07-17 DIAGNOSIS — Z79899 Other long term (current) drug therapy: Secondary | ICD-10-CM | POA: Diagnosis not present

## 2014-07-17 DIAGNOSIS — R2681 Unsteadiness on feet: Secondary | ICD-10-CM

## 2014-07-17 LAB — I-STAT CHEM 8, ED
BUN: 14 mg/dL (ref 6–23)
CREATININE: 0.8 mg/dL (ref 0.50–1.10)
Calcium, Ion: 1 mmol/L — ABNORMAL LOW (ref 1.12–1.23)
Chloride: 98 mEq/L (ref 96–112)
GLUCOSE: 102 mg/dL — AB (ref 70–99)
HCT: 44 % (ref 36.0–46.0)
Hemoglobin: 15 g/dL (ref 12.0–15.0)
Potassium: 2.8 mEq/L — CL (ref 3.7–5.3)
SODIUM: 136 meq/L — AB (ref 137–147)
TCO2: 26 mmol/L (ref 0–100)

## 2014-07-17 LAB — URINALYSIS, ROUTINE W REFLEX MICROSCOPIC
Bilirubin Urine: NEGATIVE
Glucose, UA: NEGATIVE mg/dL
Ketones, ur: NEGATIVE mg/dL
Leukocytes, UA: NEGATIVE
Nitrite: NEGATIVE
PROTEIN: NEGATIVE mg/dL
Specific Gravity, Urine: 1.005 (ref 1.005–1.030)
UROBILINOGEN UA: 0.2 mg/dL (ref 0.0–1.0)
pH: 6 (ref 5.0–8.0)

## 2014-07-17 LAB — COMPREHENSIVE METABOLIC PANEL
ALK PHOS: 78 U/L (ref 39–117)
ALT: 41 U/L — ABNORMAL HIGH (ref 0–35)
AST: 63 U/L — AB (ref 0–37)
Albumin: 3.8 g/dL (ref 3.5–5.2)
Anion gap: 18 — ABNORMAL HIGH (ref 5–15)
BUN: 14 mg/dL (ref 6–23)
CHLORIDE: 96 meq/L (ref 96–112)
CO2: 25 mEq/L (ref 19–32)
Calcium: 8.5 mg/dL (ref 8.4–10.5)
Creatinine, Ser: 0.53 mg/dL (ref 0.50–1.10)
GFR calc Af Amer: >90 mL/min (ref >90)
GFR calc non Af Amer: >90 mL/min (ref >90)
Glucose, Bld: 100 mg/dL — ABNORMAL HIGH (ref 70–99)
POTASSIUM: 3 meq/L — AB (ref 3.7–5.3)
SODIUM: 139 meq/L (ref 137–147)
Total Protein: 7 g/dL (ref 6.0–8.3)

## 2014-07-17 LAB — DIFFERENTIAL
BASOS ABS: 0.1 10*3/uL (ref 0.0–0.1)
BASOS PCT: 0 % (ref 0–1)
Eosinophils Absolute: 0.4 10*3/uL (ref 0.0–0.7)
Eosinophils Relative: 3 % (ref 0–5)
Lymphocytes Relative: 36 % (ref 12–46)
Lymphs Abs: 4.9 10*3/uL — ABNORMAL HIGH (ref 0.7–4.0)
Monocytes Absolute: 0.9 10*3/uL (ref 0.1–1.0)
Monocytes Relative: 6 % (ref 3–12)
NEUTROS ABS: 7.5 10*3/uL (ref 1.7–7.7)
Neutrophils Relative %: 55 % (ref 43–77)

## 2014-07-17 LAB — PROTIME-INR
INR: 0.88 (ref 0.00–1.49)
Prothrombin Time: 12 seconds (ref 11.6–15.2)

## 2014-07-17 LAB — CBC
HCT: 40.3 % (ref 36.0–46.0)
Hemoglobin: 13.9 g/dL (ref 12.0–15.0)
MCH: 33.4 pg (ref 26.0–34.0)
MCHC: 34.5 g/dL (ref 30.0–36.0)
MCV: 96.9 fL (ref 78.0–100.0)
Platelets: 291 10*3/uL (ref 150–400)
RBC: 4.16 MIL/uL (ref 3.87–5.11)
RDW: 12.7 % (ref 11.5–15.5)
WBC: 13.7 10*3/uL — ABNORMAL HIGH (ref 4.0–10.5)

## 2014-07-17 LAB — RAPID URINE DRUG SCREEN, HOSP PERFORMED
AMPHETAMINES: NOT DETECTED
Barbiturates: NOT DETECTED
Benzodiazepines: NOT DETECTED
Cocaine: NOT DETECTED
Opiates: NOT DETECTED
TETRAHYDROCANNABINOL: NOT DETECTED

## 2014-07-17 LAB — I-STAT TROPONIN, ED: TROPONIN I, POC: 0.01 ng/mL (ref 0.00–0.08)

## 2014-07-17 LAB — APTT: aPTT: 30 seconds (ref 24–37)

## 2014-07-17 LAB — URINE MICROSCOPIC-ADD ON

## 2014-07-17 LAB — ETHANOL: Alcohol, Ethyl (B): 220 mg/dL — ABNORMAL HIGH (ref 0–11)

## 2014-07-17 MED ORDER — MAGNESIUM SULFATE 2 GM/50ML IV SOLN
2.0000 g | Freq: Once | INTRAVENOUS | Status: AC
  Administered 2014-07-17: 2 g via INTRAVENOUS
  Filled 2014-07-17: qty 50

## 2014-07-17 MED ORDER — POTASSIUM CHLORIDE CRYS ER 20 MEQ PO TBCR
60.0000 meq | EXTENDED_RELEASE_TABLET | Freq: Once | ORAL | Status: AC
  Administered 2014-07-17: 60 meq via ORAL
  Filled 2014-07-17: qty 3

## 2014-07-17 MED ORDER — POTASSIUM CHLORIDE 10 MEQ/100ML IV SOLN
10.0000 meq | INTRAVENOUS | Status: AC
  Administered 2014-07-17 (×2): 10 meq via INTRAVENOUS
  Filled 2014-07-17 (×2): qty 100

## 2014-07-17 NOTE — ED Notes (Signed)
Pt walking around in the room  After being requested to stay on the stretcher

## 2014-07-17 NOTE — ED Notes (Signed)
Pt sleeping sats lower while sleeping 90

## 2014-07-17 NOTE — ED Notes (Signed)
Pt sleeping but arroused  With minimal effort.  No pain.  Neuro intact iv pot almost infused.  The pt will call  Her husband when she is discharged

## 2014-07-17 NOTE — ED Notes (Signed)
The patient was celebrating her anniversary with her husband and he called EMS.  The husband advised EMS that his wife ahd slurred speech, and auditory hallucinations (sounds).  The patient advised EMS that she had been drinking with her husband and she did not want to come to the ED.  The husband insisted she come to be seen by our ED physicians.

## 2014-07-17 NOTE — Consult Note (Signed)
Referring Physician: ED    Chief Complaint: dysarthria, right face weakness, unsteady gait.  HPI:                                                                                                                                         [name] [name] is an 43 y.o. female with a past medical history significant for HTN, CAD, right basal ganglia without residual deficits in 2013, anxiety, brought in via EMS as a code stroke due to acute onset of the above stated symptoms. The patient was celebrating her anniversary with her husband and he called EMS because he noticed that she was slurring her words, the right face was droopy and she was unsteady. Patient said that she did not notice such changes and said that " nothing is wrong with me, I feel fine before coming in and now". Denies HA, vertigo, double vision, difficulty swallowing, focal weakness or numbness, slurred speech, langauge or vision impairment. Stated that earlier yesterday she was " hearing unusual noises". NIHSS 0. CT brain without acute abnormality. ETOH level 220. Date last known well: 07/16/14 Time last known well: 11:30 pm tPA Given: no, NIHSS 0 and patient seems to be intoxicated NIHSS: 0   Past Medical History  Diagnosis Date  . Hypertension   . Anxiety   . Stroke   . Coronary artery disease     Past Surgical History  Procedure Laterality Date  . Cholecystectomy    . Hernia repair    . Knee surgery      right  . Carpal tunnel release      B/L hand  . Tee without cardioversion  01/22/2012    Procedure: TRANSESOPHAGEAL ECHOCARDIOGRAM (TEE);  Surgeon: Lelon Perla, MD;  Location: [facility] ENDOSCOPY;  Service: Cardiovascular;  Laterality: N/A;    History reviewed. No pertinent family history. Social History:  reports that she has never smoked. She does not have any smokeless tobacco history on file. She reports that she drinks alcohol. She reports that she does not use illicit drugs.  Allergies:  Allergies   Allergen Reactions  . Nsaids     Medications:                                                                                                                           I have reviewed the patient's current medications.  ROS:                                                                                                                                       History obtained from the patient, EMS, and chart review  General ROS: negative for - chills, fatigue, fever, night sweats, or weight loss Psychological ROS: negative for - behavioral disorder, hallucinations, memory difficulties, mood swings or suicidal ideation Ophthalmic ROS: negative for - blurry vision, double vision, eye pain or loss of vision ENT ROS: negative for - epistaxis, nasal discharge, oral lesions, sore throat, tinnitus or vertigo Allergy and Immunology ROS: negative for - hives or itchy/watery eyes Hematological and Lymphatic ROS: negative for - bleeding problems, bruising or swollen lymph nodes Endocrine ROS: negative for - galactorrhea, hair pattern changes, polydipsia/polyuria or temperature intolerance Respiratory ROS: negative for - cough, hemoptysis, shortness of breath or wheezing Cardiovascular ROS: negative for - chest pain, dyspnea on exertion, edema or irregular heartbeat Gastrointestinal ROS: negative for - abdominal pain, diarrhea, hematemesis, nausea/vomiting or stool incontinence Genito-Urinary ROS: negative for - dysuria, hematuria, incontinence or urinary frequency/urgency Musculoskeletal ROS: negative for - joint swelling or muscular weakness Neurological ROS: as noted in HPI Dermatological ROS: negative for rash and skin lesion changes  Physical exam: pleasant female in no apparent distress. Blood pressure 115/65, pulse 86, temperature 99 F (37.2 C), temperature source Oral, resp. rate 9, last menstrual period 07/17/2014, SpO2 98 %. Head: normocephalic. Neck: supple, no bruits, no JVD. Cardiac:  no murmurs. Lungs: clear. Abdomen: soft, no tender, no mass. Extremities: no edema. Neurologic Examination:                                                                                                      General: Mental Status: Alert, oriented, thought content appropriate.  Speech fluent without evidence of aphasia.  Able to follow 3 step commands without difficulty. Cranial Nerves: II: Discs flat bilaterally; Visual fields grossly normal, pupils equal, round, reactive to light and accommodation III,IV, VI: ptosis not present, extra-ocular motions intact bilaterally V,VII: smile symmetric, facial light touch sensation normal bilaterally VIII: hearing normal bilaterally IX,X: gag reflex present XI: bilateral shoulder shrug XII: midline tongue extension without atrophy or fasciculations Motor: Right : Upper extremity   5/5    Left:     Upper extremity   5/5  Lower extremity   5/5     Lower extremity   5/5 Tone and bulk:normal tone throughout; no atrophy noted Sensory: Pinprick  and light touch intact throughout, bilaterally Deep Tendon Reflexes:  Right: Upper Extremity   Left: Upper extremity   biceps (C-5 to C-6) 2/4   biceps (C-5 to C-6) 2/4 tricep (C7) 2/4    triceps (C7) 2/4 Brachioradialis (C6) 2/4  Brachioradialis (C6) 2/4  Lower Extremity Lower Extremity  quadriceps (L-2 to L-4) 2/4   quadriceps (L-2 to L-4) 2/4 Achilles (S1) 2/4   Achilles (S1) 2/4  Plantars: Right: downgoing   Left: downgoing Cerebellar: normal finger-to-nose,  normal heel-to-shin test Gait:  No tested CV: pulses palpable throughout    Results for orders placed or performed during the hospital encounter of 07/17/14 (from the past 48 hour(s))  I-Stat Chem 8, ED     Status: Abnormal   Collection Time: 07/17/14 12:22 AM  Result Value Ref Range   Sodium 136 (L) 137 - 147 mEq/L   Potassium 2.8 (LL) 3.7 - 5.3 mEq/L   Chloride 98 96 - 112 mEq/L   BUN 14 6 - 23 mg/dL   Creatinine, Ser 0.80 0.50 -  1.10 mg/dL   Glucose, Bld 102 (H) 70 - 99 mg/dL   Calcium, Ion 1.00 (L) 1.12 - 1.23 mmol/L   TCO2 26 0 - 100 mmol/L   Hemoglobin 15.0 12.0 - 15.0 g/dL   HCT 44.0 36.0 - 46.0 %   Comment NOTIFIED PHYSICIAN   I-Stat Troponin, ED (not at [facility])     Status: None   Collection Time: 07/17/14 12:22 AM  Result Value Ref Range   Troponin i, poc 0.01 0.00 - 0.08 ng/mL   Comment 3            Comment: Due to the release kinetics of cTnI, a negative result within the first hours of the onset of symptoms does not rule out myocardial infarction with certainty. If myocardial infarction is still suspected, repeat the test at appropriate intervals.    No results found.  Assessment: 43 y.o. female brought in due to acute onset dysarthria, right face droopiness, and unsteady gait. NIHSS 0, CT brain without acute abnormality. Although she has risk factors for stroke, patient has been drinking (ETOH level 220) quite likely her symptoms are due to alcohol intoxication.  Will not suggest further neurological intervention at this time unless her symptoms recur.  Dorian Pod, MD [facility] [phone]  07/17/2014, 12:42 AM

## 2014-07-17 NOTE — ED Notes (Signed)
t pt is still sleeping.  Pot run half infused

## 2014-07-17 NOTE — Discharge Instructions (Signed)
Alcohol Intoxication [name] [name], you were seen today for possible stroke. We believe her symptoms were due to alcohol intoxication. Your electrolytes were replaced in your given IV fluids. Follow-up with a primary care physician within 3 days regarding her alcohol use. If your symptoms worsen come back to the emergency department immediately for repeat evaluation. Thank you. Alcohol intoxication occurs when the amount of alcohol that a person has consumed impairs his or her ability to mentally and physically function. Alcohol directly impairs the normal chemical activity of the brain. Drinking large amounts of alcohol can lead to changes in mental function and behavior, and it can cause many physical effects that can be harmful.  Alcohol intoxication can range in severity from mild to very severe. Various factors can affect the level of intoxication that occurs, such as the person's age, gender, weight, frequency of alcohol consumption, and the presence of other medical conditions (such as diabetes, seizures, or heart conditions). Dangerous levels of alcohol intoxication may occur when people drink large amounts of alcohol in a short period (binge drinking). Alcohol can also be especially dangerous when combined with certain prescription medicines or "recreational" drugs. SIGNS AND SYMPTOMS Some common signs and symptoms of mild alcohol intoxication include:  Loss of coordination.  Changes in mood and behavior.  Impaired judgment.  Slurred speech. As alcohol intoxication progresses to more severe levels, other signs and symptoms will appear. These may include:  Vomiting.  Confusion and impaired memory.  Slowed breathing.  Seizures.  Loss of consciousness. DIAGNOSIS  Your health care provider will take a medical history and perform a physical exam. You will be asked about the amount and type of alcohol you have consumed. Blood tests will be done to measure the concentration of alcohol in  your blood. In many places, your blood alcohol level must be lower than 80 mg/dL (0.08%) to legally drive. However, many dangerous effects of alcohol can occur at much lower levels.  TREATMENT  People with alcohol intoxication often do not require treatment. Most of the effects of alcohol intoxication are temporary, and they go away as the alcohol naturally leaves the body. Your health care provider will monitor your condition until you are stable enough to go home. Fluids are sometimes given through an IV access tube to help prevent dehydration.  HOME CARE INSTRUCTIONS  Do not drive after drinking alcohol.  Stay hydrated. Drink enough water and fluids to keep your urine clear or pale yellow. Avoid caffeine.   Only take over-the-counter or prescription medicines as directed by your health care provider.  SEEK MEDICAL CARE IF:   You have persistent vomiting.   You do not feel better after a few days.  You have frequent alcohol intoxication. Your health care provider can help determine if you should see a substance use treatment counselor. SEEK IMMEDIATE MEDICAL CARE IF:   You become shaky or tremble when you try to stop drinking.   You shake uncontrollably (seizure).   You throw up (vomit) blood. This may be bright red or may look like black coffee grounds.   You have blood in your stool. This may be bright red or may appear as a black, tarry, bad smelling stool.   You become lightheaded or faint.  MAKE SURE YOU:   Understand these instructions.  Will watch your condition.  Will get help right away if you are not doing well or get worse. Document Released: 05/30/2005 Document Revised: 04/22/2013 Document Reviewed: 01/23/2013 [facility] Patient Information 2015 [place], [place].  This information is not intended to replace advice given to you by your health care provider. Make sure you discuss any questions you have with your health care provider.  Emergency Department  Resource Guide 1) Find a Doctor and Pay Out of Pocket Although you won't have to find out who is covered by your insurance plan, it is a good idea to ask around and get recommendations. You will then need to call the office and see if the doctor you have chosen will accept you as a new patient and what types of options they offer for patients who are self-pay. Some doctors offer discounts or will set up payment plans for their patients who do not have insurance, but you will need to ask so you aren't surprised when you get to your appointment.  2) Contact Your Local Health Department Not all health departments have doctors that can see patients for sick visits, but many do, so it is worth a call to see if yours does. If you don't know where your local health department is, you can check in your phone book. The CDC also has a tool to help you locate your state's health department, and many state websites also have listings of all of their local health departments.  3) Find a [facility] If your illness is not likely to be very severe or complicated, you may want to try a walk in clinic. These are popping up all over the country in pharmacies, drugstores, and shopping centers. They're usually staffed by nurse practitioners or physician assistants that have been trained to treat common illnesses and complaints. They're usually fairly quick and inexpensive. However, if you have serious medical issues or chronic medical problems, these are probably not your best option.  No Primary Care Doctor: - Call Health Connect at  [phone] - they can help you locate a primary care doctor that  accepts your insurance, provides certain services, etc. - Physician Referral Service- [phone]  Chronic Pain Problems: Organization         Address  Phone   Notes  [facility]  [phone] Patients need to be referred by their primary care doctor.   Medication Assistance: Organization          Address  Phone   Notes  [facility] Medication [facility] [place], [place] [address] [phone] --Must be a resident of [facility] -- Must have NO insurance coverage whatsoever (no Medicaid/ Medicare, etc.) -- The pt. MUST have a primary care doctor that directs their care regularly and follows them in the community   [facility]ist  [phone]   [employer]  [phone]    Agencies that provide inexpensive medical care: Organization         Address  Phone   Notes  [place]  [phone]   [facility]    [phone]   Gi Or Norman Meyer, [place] [address] [phone]   [place] [address], [place] [phone]   Planned Parenthood    [phone]   [facility]    [phone]   [address] and [place] [address], [place] Phone:  [phone], Fax:  [phone] Hours of Operation:  9 am - 6 pm, M-F.  Also accepts Medicaid/Medicare and self-pay.  [facility] for [place] [address]  [place], [place], [place] Phone: [phone], Fax: [phone]. Hours of Operation:  8:30 am - 5:30 pm, M-F.  Also accepts Medicaid and self-pay.  [facility] [address] [place] Phone: [phone]   [place], [place], [place] [phone], Ext. [phone] Mondays & Thursdays: 7-9 AM.  First 15 patients are seen on a first come, first serve basis.    [place] Providers:  Organization         Address  Phone   Notes  [facility] [address], [place] [phone] Also accepts self-pay patients.  [facility] [address], [place]  [phone]   [place], [address], [place] [phone]   [facility] [address] [place] [phone]   Lucianne Lei [address], [place]   [phone] Only accepts [place] Access [place] patients after they have their name applied to their card.   Self-Pay (no insurance) in [facility]:  Organization         Address  Phone   Notes  Sickle Cell Patients, [facility] Internal [facility] [place] [phone]   [facility] [place] [phone]   [facility] [place]  [place], [place], [place] [phone]   [facility]/Dr. Osei-Bonsu  [address], [place] or [address], [place] [phone] Phone number for both [place] and [place] locations is the same.  [facility]gent Medical and [facility] [address] [place] [phone]   [facility] [address] [place] or [address] [phone]   [facility] [address], [place] [phone], phone; [phone], fax Sees patients 1st and 3rd Saturday of every month.  Must not qualify for public or private insurance (i.e. Medicaid, Medicare, [place] Health Choice, Veterans' Benefits)  Household income should be no more than 200% of the poverty level The clinic cannot treat you if you are pregnant or think you are pregnant  Sexually transmitted diseases are not treated at the clinic.    Dental Care: Organization         Address  Phone  Notes  [facility] [phone] Accepts children up to age 73 who are enrolled in [place] or [place]; pregnant women with a Medicaid card; and children who have applied for Medicaid or [place] Health Choice, but were declined, whose parents can pay a reduced fee at time of service.  [facility] of [facility]  [address], [place] [phone] Accepts children up to age 50 who are enrolled in [place] or [place]; pregnant women with a Medicaid card; and children who have applied for Medicaid or [place] Health Choice, but were declined, whose parents can pay a reduced fee at time of service.  [facility]  [place] [phone] Patients are seen by appointment only. Walk-ins are not accepted. [place] will see patients 42 years of age and older. Monday - Tuesday (8am-5pm) Most Wednesdays (8:30-5pm) $30 per visit, cash only  [place]  [address], [place] [phone] Patients are seen by appointment only. Walk-ins are not accepted. [place] will see patients 60 years of age and older. One Wednesday Evening (Monthly: Volunteer Based).  $30 per visit, cash only  [place]  [phone] for adults; Children under age 110, call Graduate Pediatric Dentistry at [phone]. Children aged 96-14, please call [phone] to request a pediatric application.  Dental services are provided in all areas of dental care including fillings, crowns and bridges, complete and partial dentures, implants, gum treatment, root canals, and extractions. Preventive care is also provided. Treatment is provided to both adults and children. Patients are selected via a lottery and there is often a waiting list.   [facility] [address], [place]  [phone] [email or web address]   [facility] [address] [place], [place] [phone], Ext. [phone] Second and Fourth Thursday of each month, opens at 6:30 AM; Clinic ends at 9 AM.  Patients are seen on a first-come first-served basis, and a limited number are seen during each clinic.   [facility]  [address] Hillard Danker [place], [place] [phone]   Eligibility Requirements You must  have lived in [place], [place], or [place] counties for at least the last three months.   You cannot be eligible for state or federal sponsored [employer], including [employer], [place], or [employer].   You generally cannot be eligible for healthcare insurance through your employer.    How to apply: Eligibility screenings are held every Tuesday and Wednesday afternoon from 1:00 pm until 4:00 pm. You do not need an appointment for the interview!  [facility] [address] [place], [place]   [place]  [place] Department  [place]  [phone]    Behavioral Health Resources in the Community: Intensive Outpatient Programs Organization         Address  Phone  Notes  [place]. [address], [place], [place] [phone]   [facility] [address] [place], [place]   ADS: Alcohol [facility] Drug S[facility] [address], [place], [place]   [place] [address]  [place], [place] or [phone]   Substance Abuse Resources Organization         Address  Phone  Notes  Alcohol and Drug Services  [phone]   [place]  [phone]   [facility] [place]   Chinita Pester  [phone]   Residential & Outpatient Substance Abuse Program  [phone]   Psychological Services Organization         Address  Phone  Notes  [facility] [place]  [place]  [phone]   [place] [address] [place] or [phone]    Mobile Crisis Teams Organization         Address  Phone  Notes  [facility], Mobile Crisis Care Unit  [phone]   [facility]  [address] [place], [place]   [place] [address] [place] [phone]    Self-Help/Support Groups Organization         Address  Phone             Notes  [place]. of [place] -  variety of support groups  336- ([phone] Call for more information  Narcotics Anonymous (NA), [facility] [address], [employer] [place]  2 meetings at this location   Residential Facilities manager         Address  Phone  Notes  [facility] [place],    [place]  [phone]   [facility]  [address], [place] [address]892, [place], [place]   [place], [place] [phone] Admissions: 8am-3pm M-F  Incentives Substance [place] [address],    [place], [place] [phone]   [facility] [address] [place], [place], [place]   [facility] [address],  [place], [place]   [facility] Programs - Intensive Outpatient [address] Kristeen Mans [address], [place], [place]   [facility] ([place].,  [place], [place] [phone] or [phone]   [facility] (RTS) [address] [place], [place] Accepts Medicaid  [facility] [place] [address],  [place] [phone] Substance Abuse/Addiction Treatment   [facility] Organization         Address  Phone  Notes  [facility]  [phone]   Domenic Schwab, PhD [address] [place]lis [place], [place]   [phone] or [phone]   [place], [place] [phone]   [facility] [address] [place], [place] [phone] Insurance/Medicaid/sponsorship through [facility] and Families [address] [place]                                    [place], [place] [phone] [place] [address], [place] [phone]    Dr. Adele Schilder  [phone]   [facility] Dept. 1) [address] [place] 2) [place] 3)  [place] [address], [place] [phone]  [phone]   [place] [phone] or [phone] (After Hours)

## 2014-07-17 NOTE — ED Notes (Signed)
1st pot run infused 2nd run  started

## 2014-07-17 NOTE — ED Provider Notes (Signed)
CSN: [id number]     Arrival date & time 07/17/14  0012 History   First MD Initiated Contact with Patient 07/17/14 0024     No chief complaint on file.    (Consider location/radiation/quality/duration/timing/severity/associated sxs/prior Treatment) HPI  [name] [name] is a 43 y.o. female with past medical history of hypertension, CVA, coronary artery disease coming in with stroke like symptoms. Symptoms began one hour ago. Patient was taking wine with her husband when he noticed slurred speech. This is all she presented last time with her stroke. She denies any blurry vision, numbness or tingling, or muscle weakness. Patient feels like her symptoms have currently resolved although she never felt the slurred speech to begin with.  Patient was quickly evaluated emergency department and sent to CT scan. Neurology consult is at bedside.   10 Systems reviewed and are negative for acute change except as noted in the HPI.    Past Medical History  Diagnosis Date  . Hypertension   . Anxiety   . Stroke   . Coronary artery disease    Past Surgical History  Procedure Laterality Date  . Cholecystectomy    . Hernia repair    . Knee surgery      right  . Carpal tunnel release      B/L hand  . Tee without cardioversion  01/22/2012    Procedure: TRANSESOPHAGEAL ECHOCARDIOGRAM (TEE);  Surgeon: Lelon Perla, MD;  [facility];  Service: Cardiovascular;  Laterality: N/A;   No family history on file. History  Substance Use Topics  . Smoking status: Never Smoker   . Smokeless tobacco: Not on file     Comment: quit 10 years ago, smoked intermittently for few years. Less than 10 yrs total.  . Alcohol Use: Yes     Comment: occ- 1-2 drinks a week   OB History    No data available     Review of Systems    Allergies  Review of patient's allergies indicates no known allergies.  Home Medications   Prior to Admission medications   Medication Sig Start Date End Date Taking?  Authorizing Provider  amLODipine (NORVASC) 2.5 MG tablet Take 1 tablet (2.5 mg total) by mouth daily. 01/22/12 01/21/13  Delsa Sale, MD  escitalopram (LEXAPRO) 20 MG tablet Take 20 mg by mouth daily.    Historical Provider, MD  loperamide (IMODIUM) 2 MG capsule Take 2 mg by mouth daily as needed. For diarrhea    Historical Provider, MD  pravastatin (PRAVACHOL) 40 MG tablet Take 1 tablet (40 mg total) by mouth daily. 01/22/12 01/21/13  Delsa Sale, MD  traMADol (ULTRAM-ER) 100 MG 24 hr tablet Take 100 mg by mouth daily as needed. For pain    Historical Provider, MD   There were no vitals taken for this visit. Physical Exam  Constitutional: She is oriented to person, place, and time. She appears well-developed and well-nourished. No distress.  HENT:  Head: Normocephalic and atraumatic.  Eyes: Conjunctivae and EOM are normal. Pupils are equal, round, and reactive to light. No scleral icterus.  Neck: Normal range of motion. Neck supple. No JVD present. No tracheal deviation present. No thyromegaly present.  Cardiovascular: Normal rate, regular rhythm and normal heart sounds.  Exam reveals no gallop and no friction rub.   No murmur heard. Pulmonary/Chest: Effort normal and breath sounds normal. No respiratory distress. She has no wheezes. She exhibits no tenderness.  Abdominal: Soft. Bowel sounds are normal. She exhibits no distension and  no mass. There is no tenderness. There is no rebound and no guarding.  Musculoskeletal: Normal range of motion. She exhibits no edema or tenderness.  Lymphadenopathy:    She has no cervical adenopathy.  Neurological: She is alert and oriented to person, place, and time. No cranial nerve deficit. She exhibits normal muscle tone.  No slurred speech noted.  Normal strength and sensation 4 extremities. Cerebellar testing and gait not performed.  Skin: Skin is warm and dry. No rash noted. No erythema. No pallor.  Nursing note and vitals reviewed.   ED  Course  Procedures (including critical care time) Labs Review Labs Reviewed  ETHANOL - Abnormal; Notable for the following:    Alcohol, Ethyl (B) 220 (*)    All other components within normal limits  CBC - Abnormal; Notable for the following:    WBC 13.7 (*)    All other components within normal limits  DIFFERENTIAL - Abnormal; Notable for the following:    Lymphs Abs 4.9 (*)    All other components within normal limits  COMPREHENSIVE METABOLIC PANEL - Abnormal; Notable for the following:    Potassium 3.0 (*)    Glucose, Bld 100 (*)    AST 63 (*)    ALT 41 (*)    Total Bilirubin <0.2 (*)    Anion gap 18 (*)    All other components within normal limits  URINALYSIS, ROUTINE W REFLEX MICROSCOPIC - Abnormal; Notable for the following:    Hgb urine dipstick MODERATE (*)    All other components within normal limits  I-STAT CHEM 8, ED - Abnormal; Notable for the following:    Sodium 136 (*)    Potassium 2.8 (*)    Glucose, Bld 102 (*)    Calcium, Ion 1.00 (*)    All other components within normal limits  PROTIME-INR  APTT  URINE RAPID DRUG SCREEN (HOSP PERFORMED)  URINE MICROSCOPIC-ADD ON  I-STAT TROPOININ, ED  I-STAT TROPOININ, ED    Imaging Review Ct Head Wo Contrast  07/17/2014   CLINICAL DATA:  Code stroke, slurred speech, unsteady gait  EXAM: CT HEAD WITHOUT CONTRAST  TECHNIQUE: Contiguous axial images were obtained from the base of the skull through the vertex without intravenous contrast.  COMPARISON:  MRI brain 01/19/2012  FINDINGS: There is no evidence of mass effect, midline shift or extra-axial fluid collections. There is no evidence of a space-occupying lesion or intracranial hemorrhage. There is no evidence of a cortical-based area of acute infarction. There is an old right basal ganglia lacunar infarct.  The ventricles and sulci are appropriate for the patient's age. The basal cisterns are patent.  Visualized portions of the orbits are unremarkable. The visualized  portions of the paranasal sinuses and mastoid air cells are unremarkable.  The osseous structures are unremarkable.  IMPRESSION: No acute intracranial pathology.  These results were called by telephone at the time of interpretation on 07/17/2014 at 1:41 am to Dr. Aram Beecham, who verbally acknowledged these results.   Electronically Signed   By: Kathreen Devoid   On: 07/17/2014 01:42     EKG Interpretation   Date/Time:  Saturday July 17 2014 00:29:14 EST Ventricular Rate:  89 PR Interval:  156 QRS Duration: 97 QT Interval:  393 QTC Calculation: 478 R Axis:   59 Text Interpretation:  Sinus rhythm Nonspecific T abnormalities, anterior  leads No significant change since last tracing Confirmed by Glynn Octave [phone]) on 07/17/2014 12:51:59 AM      MDM  Final diagnoses:  None    Patient since emergency department out of concern for stroke. I discussed this case with the neurologist who believes this may be due to alcohol. Patient does admit to drinking wine and having slurred speech. In addition her potassium was 2.8 consistent with chronic alcoholism. She was given magnesium and potassium replacement. CT scan is negative for any hemorrhagic stroke.  Blood alcohol level is 220.  Potassium and magnesium were replaced.  Her neurological exam is normal, she is resting comfortably in the room in no acute distress. Neurology agrees that this is likely due to alcohol. Patient does not have recent for admission. Patient is safe for discharge, her vital signs remain within her normal limits.  Everlene Balls, MD 07/17/14 [phone]

## 2014-08-03 HISTORY — PX: KNEE ARTHROSCOPY: SHX127

## 2014-08-28 ENCOUNTER — Encounter (HOSPITAL_COMMUNITY): Payer: Self-pay | Admitting: Family Medicine

## 2014-08-28 ENCOUNTER — Emergency Department (HOSPITAL_COMMUNITY): Payer: 59

## 2014-08-28 ENCOUNTER — Emergency Department (HOSPITAL_COMMUNITY)
Admission: EM | Admit: 2014-08-28 | Discharge: 2014-08-28 | Disposition: A | Discharge status: Home / Self Care | Payer: 59 | Attending: Emergency Medicine | Admitting: Emergency Medicine

## 2014-08-28 DIAGNOSIS — Z79899 Other long term (current) drug therapy: Secondary | ICD-10-CM | POA: Insufficient documentation

## 2014-08-28 DIAGNOSIS — M25461 Effusion, right knee: Secondary | ICD-10-CM | POA: Diagnosis not present

## 2014-08-28 DIAGNOSIS — M25561 Pain in right knee: Secondary | ICD-10-CM | POA: Diagnosis present

## 2014-08-28 DIAGNOSIS — Z7982 Long term (current) use of aspirin: Secondary | ICD-10-CM | POA: Diagnosis not present

## 2014-08-28 DIAGNOSIS — Z8673 Personal history of transient ischemic attack (TIA), and cerebral infarction without residual deficits: Secondary | ICD-10-CM | POA: Insufficient documentation

## 2014-08-28 DIAGNOSIS — I2510 Atherosclerotic heart disease of native coronary artery without angina pectoris: Secondary | ICD-10-CM | POA: Insufficient documentation

## 2014-08-28 DIAGNOSIS — F419 Anxiety disorder, unspecified: Secondary | ICD-10-CM | POA: Diagnosis not present

## 2014-08-28 DIAGNOSIS — I10 Essential (primary) hypertension: Secondary | ICD-10-CM | POA: Insufficient documentation

## 2014-08-28 MED ORDER — HYDROMORPHONE HCL 1 MG/ML IJ SOLN
1.0000 mg | Freq: Once | INTRAMUSCULAR | Status: AC
  Administered 2014-08-28: 1 mg via INTRAMUSCULAR
  Filled 2014-08-28: qty 1

## 2014-08-28 MED ORDER — OXYCODONE-ACETAMINOPHEN 5-325 MG PO TABS
1.0000 | ORAL_TABLET | Freq: Once | ORAL | Status: AC
  Administered 2014-08-28: 1 via ORAL
  Filled 2014-08-28: qty 1

## 2014-08-28 NOTE — ED Notes (Signed)
PA at bedside.

## 2014-08-28 NOTE — Discharge Instructions (Signed)
Knee Effusion The medical term for having fluid in your knee is effusion. This is often due to an internal derangement of the knee. This means something is wrong inside the knee. Some of the causes of fluid in the knee may be torn cartilage, a torn ligament, or bleeding into the joint from an injury. Your knee is likely more difficult to bend and move. This is often because there is increased pain and pressure in the joint. The time it takes for recovery from a knee effusion depends on different factors, including:   Type of injury.  Your age.  Physical and medical conditions.  Rehabilitation Strategies. How long you will be away from your normal activities will depend on what kind of knee problem you have and how much damage is present. Your knee has two types of cartilage. Articular cartilage covers the bone ends and lets your knee bend and move smoothly. Two menisci, thick pads of cartilage that form a rim inside the joint, help absorb shock and stabilize your knee. Ligaments bind the bones together and support your knee joint. Muscles move the joint, help support your knee, and take stress off the joint itself. CAUSES  Often an effusion in the knee is caused by an injury to one of the menisci. This is often a tear in the cartilage. Recovery after a meniscus injury depends on how much meniscus is damaged and whether you have damaged other knee tissue. Small tears may heal on their own with conservative treatment. Conservative means rest, limited weight bearing activity and muscle strengthening exercises. Your recovery may take up to 6 weeks.  TREATMENT  Larger tears may require surgery. Meniscus injuries may be treated during arthroscopy. Arthroscopy is a procedure in which your surgeon uses a small telescope like instrument to look in your knee. Your caregiver can make a more accurate diagnosis (learning what is wrong) by performing an arthroscopic procedure. If your injury is on the inner margin  of the meniscus, your surgeon may trim the meniscus back to a smooth rim. In other cases your surgeon will try to repair a damaged meniscus with stitches (sutures). This may make rehabilitation take longer, but may provide better long term result by helping your knee keep its shock absorption capabilities. Ligaments which are completely torn usually require surgery for repair. HOME CARE INSTRUCTIONS  Use crutches as instructed.  If a brace is applied, use as directed.  Once you are home, an ice pack applied to your swollen knee may help with discomfort and help decrease swelling.  Keep your knee raised (elevated) when you are not up and around or on crutches.  Only take over-the-counter or prescription medicines for pain, discomfort, or fever as directed by your caregiver.  Your caregivers will help with instructions for rehabilitation of your knee. This often includes strengthening exercises.  You may resume a normal diet and activities as directed. SEEK MEDICAL CARE IF:   There is increased swelling in your knee.  You notice redness, swelling, or increasing pain in your knee.  An unexplained oral temperature above 102 F (38.9 C) develops. SEEK IMMEDIATE MEDICAL CARE IF:   You develop a rash.  You have difficulty breathing.  You have any allergic reactions from medications you may have been given.  There is severe pain with any motion of the knee. MAKE SURE YOU:   Understand these instructions.  Will watch your condition.  Will get help right away if you are not doing well or get worse.  Document Released: 11/10/2003 Document Revised: 11/12/2011 Document Reviewed: 01/14/2008 [facility] Patient Information 2015 [place], [place]. This information is not intended to replace advice given to you by your health care provider. Make sure you discuss any questions you have with your health care provider. Knee Pain The knee is the complex joint between your thigh and your lower leg.  It is made up of bones, tendons, ligaments, and cartilage. The bones that make up the knee are:  The femur in the thigh.  The tibia and fibula in the lower leg.  The patella or kneecap riding in the groove on the lower femur. CAUSES  Knee pain is a common complaint with many causes. A few of these causes are:  Injury, such as:  A ruptured ligament or tendon injury.  Torn cartilage.  Medical conditions, such as:  Gout  Arthritis  Infections  Overuse, over training, or overdoing a physical activity. Knee pain can be minor or severe. Knee pain can accompany debilitating injury. Minor knee problems often respond well to self-care measures or get well on their own. More serious injuries may need medical intervention or even surgery. SYMPTOMS The knee is complex. Symptoms of knee problems can vary widely. Some of the problems are:  Pain with movement and weight bearing.  Swelling and tenderness.  Buckling of the knee.  Inability to straighten or extend your knee.  Your knee locks and you cannot straighten it.  Warmth and redness with pain and fever.  Deformity or dislocation of the kneecap. DIAGNOSIS  Determining what is wrong may be very straight forward such as when there is an injury. It can also be challenging because of the complexity of the knee. Tests to make a diagnosis may include:  Your caregiver taking a history and doing a physical exam.  Routine X-rays can be used to rule out other problems. X-rays will not reveal a cartilage tear. Some injuries of the knee can be diagnosed by:  Arthroscopy a surgical technique by which a small video camera is inserted through tiny incisions on the sides of the knee. This procedure is used to examine and repair internal knee joint problems. Tiny instruments can be used during arthroscopy to repair the torn knee cartilage (meniscus).  Arthrography is a radiology technique. A contrast liquid is directly injected into the knee  joint. Internal structures of the knee joint then become visible on X-ray film.  An MRI scan is a non X-ray radiology procedure in which magnetic fields and a computer produce two- or three-dimensional images of the inside of the knee. Cartilage tears are often visible using an MRI scanner. MRI scans have largely replaced arthrography in diagnosing cartilage tears of the knee.  Blood work.  Examination of the fluid that helps to lubricate the knee joint (synovial fluid). This is done by taking a sample out using a needle and a syringe. TREATMENT The treatment of knee problems depends on the cause. Some of these treatments are:  Depending on the injury, proper casting, splinting, surgery, or physical therapy care will be needed.  Give yourself adequate recovery time. Do not overuse your joints. If you begin to get sore during workout routines, back off. Slow down or do fewer repetitions.  For repetitive activities such as cycling or running, maintain your strength and nutrition.  Alternate muscle groups. For example, if you are a weight lifter, work the upper body on one day and the lower body the next.  Either tight or weak muscles do not give  the proper support for your knee. Tight or weak muscles do not absorb the stress placed on the knee joint. Keep the muscles surrounding the knee strong.  Take care of mechanical problems.  If you have flat feet, orthotics or special shoes may help. See your caregiver if you need help.  Arch supports, sometimes with wedges on the inner or outer aspect of the heel, can help. These can shift pressure away from the side of the knee most bothered by osteoarthritis.  A brace called an "unloader" brace also may be used to help ease the pressure on the most arthritic side of the knee.  If your caregiver has prescribed crutches, braces, wraps or ice, use as directed. The acronym for this is PRICE. This means protection, rest, ice, compression, and  elevation.  Nonsteroidal anti-inflammatory drugs (NSAIDs), can help relieve pain. But if taken immediately after an injury, they may actually increase swelling. Take NSAIDs with food in your stomach. Stop them if you develop stomach problems. Do not take these if you have a history of ulcers, stomach pain, or bleeding from the bowel. Do not take without your caregiver's approval if you have problems with fluid retention, heart failure, or kidney problems.  For ongoing knee problems, physical therapy may be helpful.  Glucosamine and chondroitin are over-the-counter dietary supplements. Both may help relieve the pain of osteoarthritis in the knee. These medicines are different from the usual anti-inflammatory drugs. Glucosamine may decrease the rate of cartilage destruction.  Injections of a corticosteroid drug into your knee joint may help reduce the symptoms of an arthritis flare-up. They may provide pain relief that lasts a few months. You may have to wait a few months between injections. The injections do have a small increased risk of infection, water retention, and elevated blood sugar levels.  Hyaluronic acid injected into damaged joints may ease pain and provide lubrication. These injections may work by reducing inflammation. A series of shots may give relief for as long as 6 months.  Topical painkillers. Applying certain ointments to your skin may help relieve the pain and stiffness of osteoarthritis. Ask your pharmacist for suggestions. Many over the-counter products are approved for temporary relief of arthritis pain.  In some countries, doctors often prescribe topical NSAIDs for relief of chronic conditions such as arthritis and tendinitis. A review of treatment with NSAID creams found that they worked as well as oral medications but without the serious side effects. PREVENTION  Maintain a healthy weight. Extra pounds put more strain on your joints.  Get strong, stay limber. Weak muscles  are a common cause of knee injuries. Stretching is important. Include flexibility exercises in your workouts.  Be smart about exercise. If you have osteoarthritis, chronic knee pain or recurring injuries, you may need to change the way you exercise. This does not mean you have to stop being active. If your knees ache after jogging or playing basketball, consider switching to swimming, water aerobics, or other low-impact activities, at least for a few days a week. Sometimes limiting high-impact activities will provide relief.  Make sure your shoes fit well. Choose footwear that is right for your sport.  Protect your knees. Use the proper gear for knee-sensitive activities. Use kneepads when playing volleyball or laying carpet. Buckle your seat belt every time you drive. Most shattered kneecaps occur in car accidents.  Rest when you are tired. SEEK MEDICAL CARE IF:  You have knee pain that is continual and does not seem to be getting  better.  SEEK IMMEDIATE MEDICAL CARE IF:  Your knee joint feels hot to the touch and you have a high fever. MAKE SURE YOU:   Understand these instructions.  Will watch your condition.  Will get help right away if you are not doing well or get worse. Document Released: 06/17/2007 Document Revised: 11/12/2011 Document Reviewed: 06/17/2007 [facility] Patient Information 2015 [place], [place]. This information is not intended to replace advice given to you by your health care provider. Make sure you discuss any questions you have with your health care provider. Knee Immobilizer A knee immobilizer is used to support and protect an injured or painful knee. Knee immobilizers keep your knee from being used while it is healing. Some of the common immobilizers used include splints (air, plaster, fiberglass, stiff cloth, or aluminum) or casts. Wear your knee immobilizer as instructed and only remove it as instructed. HOME CARE INSTRUCTIONS   Use absorbent powder (such as baby  powder or talcum powder) to control irritation from sweat and friction.  Adjust the immobilizer to be firm but not tight. Signs of an immobilizer that is too tight include:  Swelling.  Numbness.  Color change in your foot or ankle.  Increased pain.  While resting, raise your leg above the level of your heart. Pillows can be used for support. This reduces throbbing and helps healing.  Remove the immobilizer to bathe and sleep. SEEK MEDICAL CARE IF:   You have increasing pain or swelling in the knee, foot, or ankle.  You have problems caused by the knee immobilizer, or it breaks or needs replacement. MAKE SURE YOU:   Understand these instructions.  Will watch your condition.  Will get help right away if you are not doing well or get worse. Document Released: 08/20/2005 Document Revised: 01/04/2014 Document Reviewed: 04/13/2013 [facility] Patient Information 2015 [place], [place]. This information is not intended to replace advice given to you by your health care provider. Make sure you discuss any questions you have with your health care provider.

## 2014-08-28 NOTE — ED Notes (Signed)
Pt having right knee pain. sts recent ortho surgery. sts she followed up with doctor to have suture removed and some clear drainage then but was told ok by her doctor. sts knee very painful and swollen.

## 2014-08-28 NOTE — ED Provider Notes (Signed)
CSN: [id number]     Arrival date & time 08/28/14  6761 History   First MD Initiated Contact with Patient 08/28/14 1233     Chief Complaint  Patient presents with  . Knee Pain   [name] [name] is a 43 y.o. female with a history of hypertension, anxiety and osteoarthritis who presents to the emergency department complaining of right knee pain and swelling with onset about 10 hours prior to arrival. The patient has had a recent knee scope on 08/13/2014 by Dr. Len Childs. Patient reports she had her stitches out on 08/25/14 and noticed some clear drainage from one site. She reports the physician noted this and told her this was normal. Patient reports she's been walking on her knees without difficulty recently. Patient reports that around 3 AM this morning she woke up with knee swelling and pain that has gotten progressively worse. She currently rates her pain at 9 out of 10 and worse with movement. She reports she took tramadol early this morning without relief. She reports she is unable to walk, due to pain. Patient put she called her on-call orthopedic surgeon who recommended she be seen in the office this coming Monday and he called in an antibiotic for her to start. She reports he advised her that if she went to the emergency department they would not be able to do anything for her. The patient had her knee scope done at [facility]. The patient denies recent injury or trauma. The patient denies fevers, chills, nausea, vomiting, diarrhea, abdominal pain, pus discharge, redness of her knee, calf swelling or calf tenderness.  (Consider location/radiation/quality/duration/timing/severity/associated sxs/prior Treatment) HPI  Past Medical History  Diagnosis Date  . Hypertension   . Anxiety   . Stroke   . Coronary artery disease    Past Surgical History  Procedure Laterality Date  . Cholecystectomy    . Hernia repair    . Carpal tunnel release      B/L hand  . Tee without  cardioversion  01/22/2012    Procedure: TRANSESOPHAGEAL ECHOCARDIOGRAM (TEE);  Surgeon: Lelon Perla, MD;  Location: [facility] ENDOSCOPY;  Service: Cardiovascular;  Laterality: N/A;  . Knee arthroscopy Bilateral 08/2014    [facility] Dr. Len Childs   History reviewed. No pertinent family history. History  Substance Use Topics  . Smoking status: Never Smoker   . Smokeless tobacco: Not on file     Comment: quit 10 years ago, smoked intermittently for few years. Less than 10 yrs total.  . Alcohol Use: Yes     Comment: occ- 1-2 drinks a week   OB History    No data available     Review of Systems  Constitutional: Negative for fever and chills.  HENT: Negative for congestion and sore throat.   Eyes: Negative for visual disturbance.  Respiratory: Negative for cough, shortness of breath and wheezing.   Cardiovascular: Negative for chest pain and palpitations.  Gastrointestinal: Negative for nausea, vomiting, abdominal pain and diarrhea.  Genitourinary: Negative for dysuria.  Musculoskeletal: Positive for joint swelling. Negative for back pain and neck pain.  Skin: Negative for rash.  Neurological: Negative for dizziness and headaches.      Allergies  Nsaids  Home Medications   Prior to Admission medications   Medication Sig Start Date End Date Taking? Authorizing Provider  aspirin 325 MG tablet Take 325 mg by mouth daily.   Yes Historical Provider, MD  Certolizumab Pegol (CIMZIA PREFILLED [place]) Inject 1 Applicatorful into the skin every  30 (thirty) days.   Yes Historical Provider, MD  escitalopram (LEXAPRO) 20 MG tablet Take 20 mg by mouth daily.   Yes Historical Provider, MD  hydrochlorothiazide (MICROZIDE) 12.5 MG capsule Take 12.5 mg by mouth daily.   Yes Historical Provider, MD  loperamide (IMODIUM) 2 MG capsule Take 2 mg by mouth daily as needed. For diarrhea   Yes Historical Provider, MD  losartan-hydrochlorothiazide (HYZAAR) 100-25 MG per tablet Take 1 tablet by mouth daily.   Yes  Historical Provider, MD  OVER THE COUNTER MEDICATION Inject 1 application as directed every 30 (thirty) days. cimzia   Yes Historical Provider, MD  pravastatin (PRAVACHOL) 40 MG tablet Take 60 mg by mouth daily.   Yes Historical Provider, MD  traMADol (ULTRAM-ER) 100 MG 24 hr tablet Take 100 mg by mouth daily as needed. For pain   Yes Historical Provider, MD  amLODipine (NORVASC) 2.5 MG tablet Take 1 tablet (2.5 mg total) by mouth daily. 01/22/12 01/21/13  Delsa Sale, MD  pravastatin (PRAVACHOL) 40 MG tablet Take 1 tablet (40 mg total) by mouth daily. 01/22/12 07/17/14  Delsa Sale, MD   BP 111/68 mmHg  Pulse 90  Temp(Src) 98.4 F (36.9 C) (Oral)  Resp 24  SpO2 96%  LMP 08/08/2014 Physical Exam  Constitutional: She appears well-developed and well-nourished. No distress.  Nontoxic-appearing.  HENT:  Head: Normocephalic and atraumatic.  Right Ear: External ear normal.  Left Ear: External ear normal.  Mouth/Throat: Oropharynx is clear and moist. No oropharyngeal exudate.  Eyes: Conjunctivae are normal. Pupils are equal, round, and reactive to light. Right eye exhibits no discharge. Left eye exhibits no discharge.  Neck: Neck supple.  Cardiovascular: Normal rate, regular rhythm, normal heart sounds and intact distal pulses.  Exam reveals no gallop and no friction rub.   No murmur heard. Bilateral posterior tibialis and dorsalis pedis pulses are intact.  Pulmonary/Chest: Effort normal and breath sounds normal. No respiratory distress. She has no wheezes. She has no rales.  Abdominal: Soft. She exhibits no distension. There is no tenderness.  Musculoskeletal: She exhibits edema and tenderness.  Patient has anterior right knee edema without erythema or ecchymosis. There is a small amount of clear discharge from a surgical incision. The patient has restricted range of motion due to pain. No ankle or calve edema bilaterally. Calves are non-tender to palpation bilaterally.    Lymphadenopathy:    She has no cervical adenopathy.  Neurological: She is alert. Coordination normal.  Skin: Skin is warm and dry. No rash noted. She is not diaphoretic. No erythema. No pallor.  Psychiatric: She has a normal mood and affect. Her behavior is normal.  Nursing note and vitals reviewed.   ED Course  Procedures (including critical care time) Labs Review Labs Reviewed - No data to display  Imaging Review Dg Knee Complete 4 Views Right  08/28/2014   CLINICAL DATA:  Acute anterior right knee pain and swelling  EXAM: RIGHT KNEE - COMPLETE 4+ VIEW  COMPARISON:  None.  FINDINGS: 3 anterior tibial screws noted from prior surgery. Normal alignment. No acute osseous finding or fracture. Mild soft tissue swelling on the lateral view with a moderate to large joint effusion.  IMPRESSION: Anterior knee soft tissue swelling with a joint effusion.  Postop changes of the proximal tibia  No acute fracture.   Electronically Signed   By: Daryll Brod M.D.   On: 08/28/2014 11:32     EKG Interpretation None      Filed Vitals:  08/28/14 1215 08/28/14 1230 08/28/14 1245 08/28/14 1315  BP: 105/48 111/60 107/73 111/68  Pulse: 90 88 108 90  Temp:      TempSrc:      Resp:      SpO2: 95% 95% 95% 96%     MDM   Meds given in ED:  Medications  oxyCODONE-acetaminophen (PERCOCET/ROXICET) 5-325 MG per tablet 1 tablet (1 tablet Oral Given 08/28/14 1016)  oxyCODONE-acetaminophen (PERCOCET/ROXICET) 5-325 MG per tablet 1 tablet (1 tablet Oral Given 08/28/14 1231)  HYDROmorphone (DILAUDID) injection 1 mg (1 mg Intramuscular Given 08/28/14 1336)    Discharge Medication List as of 08/28/2014  2:01 PM      Final diagnoses:  Knee pain, right  Knee swelling, right   Patient presents the emergency department complaining of right knee edema since 3 AM this morning with history of previous right knee scope on 08/13/2014. The patient's repeat surgeons Dr. Len Childs. The patient reports she called  her on-call orthopedic surgeon who advised her to rest and take Keflex of his called in for her. She reports that on-call orthopedic surgeon advised her that if she went to the emergency department that the ED would not be able to help her. The patient has anterior right knee edema without erythema or ecchymosis. There is a small amount of clear discharge from surgical incision. Patient's knee is not hot to touch. Her exam is not consistent with a septic joint or infection. The patient x-ray showed no bony abnormality, just soft tissue edema.  Patient reports no relief with Percocet given in ED. The patient received Dilaudid 1 mg and reports this improved her pain tremendously. Patient is still unwilling to try and walk on her right knee after Dilaudid. I spoke with the on-call orthopedic surgeon, Dr. Linton Rump, with [facility]. He advised me that he spoke with the patient earlier today and advised her to follow-up in office this coming Monday. He reports he called in a prescription for Keflex that he would like her to start. He advised me to place the patient in a knee immobilizer and give her crutches and have her follow-up Monday. Dr. Linton Rump advised that patient has a history of drug-seeking and narcotic abuse and I should not provide anything more than tramadol for use at home.  This patient was looked up on the [place] controlled substance database which indicated that she received a 30 day supply of tramadol on 08/16/14. The patient reports she does have tramadol at home. I will not provide the patient with any narcotic pain medication for home use.  I advised the patient of the plan to place in a knee immobilizer. The patient reports she has crutches at home and that her husband will bring them.  She does not wish for crutches at this time. Explained to patient that she can use tramadol that she has at home for pain, but I would not be providing her with any more narcotic pain medicine. The  patient understands. Advised the patient to follow up with her orthopedic surgeon for this coming Monday. Advised patient return to emergency room with new or worsenen symptoms or new concerns. Patient verbalized understanding and agreement with plan.  This patient was discussed with Dr. Thurnell Garbe who agrees with assessment and plan.     Hanley Hays, PA-C 08/28/14 [place], DO 08/30/14 [phone]

## 2014-12-14 ENCOUNTER — Other Ambulatory Visit: Payer: Self-pay | Admitting: Rheumatology

## 2014-12-14 DIAGNOSIS — M25552 Pain in left hip: Secondary | ICD-10-CM

## 2014-12-15 ENCOUNTER — Ambulatory Visit
Admission: RE | Admit: 2014-12-15 | Discharge: 2014-12-15 | Disposition: A | Discharge status: Home / Self Care | Payer: 59 | Source: Ambulatory Visit | Attending: Rheumatology | Admitting: Rheumatology

## 2014-12-15 DIAGNOSIS — M25552 Pain in left hip: Secondary | ICD-10-CM

## 2014-12-15 MED ORDER — GADOBENATE DIMEGLUMINE 529 MG/ML IV SOLN
17.0000 mL | Freq: Once | INTRAVENOUS | Status: AC | PRN
  Administered 2014-12-15: 17 mL via INTRAVENOUS

## 2015-01-11 DIAGNOSIS — L720 Epidermal cyst: Secondary | ICD-10-CM | POA: Insufficient documentation

## 2015-01-18 ENCOUNTER — Encounter (HOSPITAL_COMMUNITY)
Admission: RE | Admit: 2015-01-18 | Discharge: 2015-01-18 | Disposition: A | Discharge status: Home / Self Care | Payer: 59 | Source: Ambulatory Visit | Attending: Internal Medicine | Admitting: Internal Medicine

## 2015-01-18 ENCOUNTER — Encounter (HOSPITAL_COMMUNITY): Payer: Self-pay

## 2015-01-18 ENCOUNTER — Other Ambulatory Visit (HOSPITAL_COMMUNITY): Payer: Self-pay | Admitting: Rheumatology

## 2015-01-18 DIAGNOSIS — L4050 Arthropathic psoriasis, unspecified: Secondary | ICD-10-CM | POA: Diagnosis present

## 2015-01-18 HISTORY — DX: Arthropathic psoriasis, unspecified: L40.50

## 2015-01-18 MED ORDER — SODIUM CHLORIDE 0.9 % IV SOLN
INTRAVENOUS | Status: DC
  Administered 2015-01-18: 09:00:00 via INTRAVENOUS

## 2015-01-18 MED ORDER — ACETAMINOPHEN 325 MG PO TABS
650.0000 mg | ORAL_TABLET | ORAL | Status: DC | PRN
  Administered 2015-01-18: 650 mg via ORAL
  Filled 2015-01-18: qty 2

## 2015-01-18 MED ORDER — FAMOTIDINE 20 MG PO TABS
20.0000 mg | ORAL_TABLET | ORAL | Status: DC | PRN
  Administered 2015-01-18: 20 mg via ORAL
  Filled 2015-01-18: qty 1

## 2015-01-18 MED ORDER — SODIUM CHLORIDE 0.9 % IV SOLN
5.0000 mg/kg | Freq: Once | INTRAVENOUS | Status: AC
  Administered 2015-01-18: 500 mg via INTRAVENOUS
  Filled 2015-01-18: qty 50

## 2015-01-18 NOTE — Progress Notes (Signed)
Tolerated treatment w/o incident. Observed x 1 hr post as recommended

## 2015-01-18 NOTE — Discharge Instructions (Signed)
Infliximab injection °What is this medicine? °INFLIXIMAB (in FLIX i mab) is used to treat Crohn's disease and ulcerative colitis. It is also used to treat ankylosing spondylitis, psoriasis, and some forms of arthritis. °This medicine may be used for other purposes; ask your health care provider or pharmacist if you have questions. °COMMON BRAND NAME(S): Remicade °What should I tell my health care provider before I take this medicine? °They need to know if you have any of these conditions: °-diabetes °-exposure to tuberculosis °-heart failure °-hepatitis or liver disease °-immune system problems °-infection °-lung or breathing disease, like COPD °-multiple sclerosis °-current or past resident of [place] °-seizure disorder °-an unusual or allergic reaction to infliximab, mouse proteins, other medicines, foods, dyes, or preservatives °-pregnant or trying to get pregnant °-breast-feeding °How should I use this medicine? °This medicine is for injection into a vein. It is usually given by a health care professional in a hospital or clinic setting. °A special MedGuide will be given to you by the pharmacist with each prescription and refill. Be sure to read this information carefully each time. °Talk to your pediatrician regarding the use of this medicine in children. Special care may be needed. °Overdosage: If you think you have taken too much of this medicine contact a poison control center or emergency room at once. °NOTE: This medicine is only for you. Do not share this medicine with others. °What if I miss a dose? °It is important not to miss your dose. Call your doctor or health care professional if you are unable to keep an appointment. °What may interact with this medicine? °Do not take this medicine with any of the following medications: °-anakinra °-rilonacept °This medicine may also interact with the following medications: °-vaccines °This list may not describe all possible interactions.  Give your health care provider a list of all the medicines, herbs, non-prescription drugs, or dietary supplements you use. Also tell them if you smoke, drink alcohol, or use illegal drugs. Some items may interact with your medicine. °What should I watch for while using this medicine? °Visit your doctor or health care professional for regular checks on your progress. °If you get a cold or other infection while receiving this medicine, call your doctor or health care professional. Do not treat yourself. This medicine may decrease your body's ability to fight infections. Before beginning therapy, your doctor may do a test to see if you have been exposed to tuberculosis. °This medicine may make the symptoms of heart failure worse in some patients. If you notice symptoms such as increased shortness of breath or swelling of the ankles or legs, contact your health care provider right away. °If you are going to have surgery or dental work, tell your health care professional or dentist that you have received this medicine. °If you take this medicine for plaque psoriasis, stay out of the sun. If you cannot avoid being in the sun, wear protective clothing and use sunscreen. Do not use sun lamps or tanning beds/booths. °What side effects may I notice from receiving this medicine? °Side effects that you should report to your doctor or health care professional as soon as possible: °-allergic reactions like skin rash, itching or hives, swelling of the face, lips, or tongue °-chest pain °-fever or chills, usually related to the infusion °-muscle or joint pain °-red, scaly patches or raised bumps on the skin °-signs of infection - fever or chills, cough, sore throat, pain or difficulty passing urine °-swollen lymph nodes   in the neck, underarm, or groin areas °-unexplained weight loss °-unusual bleeding or bruising °-unusually weak or tired °-yellowing of the eyes or skin °Side effects that usually do not require medical attention  (report to your doctor or health care professional if they continue or are bothersome): °-headache °-heartburn or stomach pain °-nausea, vomiting °This list may not describe all possible side effects. Call your doctor for medical advice about side effects. You may report side effects to FDA at 1-800-FDA-1088. °Where should I keep my medicine? °This drug is given in a hospital or clinic and will not be stored at home. °NOTE: This sheet is a summary. It may not cover all possible information. If you have questions about this medicine, talk to your doctor, pharmacist, or health care provider. °© 2015, Elsevier/Gold Standard. (2008-04-07 10:26:02) ° °

## 2015-02-01 ENCOUNTER — Encounter (HOSPITAL_COMMUNITY)
Admission: RE | Admit: 2015-02-01 | Discharge: 2015-02-01 | Disposition: A | Discharge status: Home / Self Care | Payer: 59 | Source: Ambulatory Visit | Attending: Internal Medicine | Admitting: Internal Medicine

## 2015-02-01 ENCOUNTER — Encounter (HOSPITAL_COMMUNITY): Payer: Self-pay

## 2015-02-01 DIAGNOSIS — L4050 Arthropathic psoriasis, unspecified: Secondary | ICD-10-CM | POA: Diagnosis not present

## 2015-02-01 MED ORDER — ACETAMINOPHEN 325 MG PO TABS
650.0000 mg | ORAL_TABLET | ORAL | Status: DC | PRN
  Administered 2015-02-01: 650 mg via ORAL
  Filled 2015-02-01: qty 2

## 2015-02-01 MED ORDER — FAMOTIDINE 20 MG PO TABS
20.0000 mg | ORAL_TABLET | ORAL | Status: DC | PRN
  Administered 2015-02-01: 20 mg via ORAL
  Filled 2015-02-01: qty 1

## 2015-02-01 MED ORDER — SODIUM CHLORIDE 0.9 % IV SOLN
5.0000 mg/kg | Freq: Once | INTRAVENOUS | Status: AC
  Administered 2015-02-01: 500 mg via INTRAVENOUS
  Filled 2015-02-01: qty 50

## 2015-02-01 MED ORDER — SODIUM CHLORIDE 0.9 % IV SOLN
INTRAVENOUS | Status: DC
  Administered 2015-02-01: 09:00:00 via INTRAVENOUS

## 2015-02-01 NOTE — Discharge Instructions (Signed)
Infliximab injection °What is this medicine? °INFLIXIMAB (in FLIX i mab) is used to treat Crohn's disease and ulcerative colitis. It is also used to treat ankylosing spondylitis, psoriasis, and some forms of arthritis. °This medicine may be used for other purposes; ask your health care provider or pharmacist if you have questions. °COMMON BRAND NAME(S): Remicade °What should I tell my health care provider before I take this medicine? °They need to know if you have any of these conditions: °-diabetes °-exposure to tuberculosis °-heart failure °-hepatitis or liver disease °-immune system problems °-infection °-lung or breathing disease, like COPD °-multiple sclerosis °-current or past resident of [place] °-seizure disorder °-an unusual or allergic reaction to infliximab, mouse proteins, other medicines, foods, dyes, or preservatives °-pregnant or trying to get pregnant °-breast-feeding °How should I use this medicine? °This medicine is for injection into a vein. It is usually given by a health care professional in a hospital or clinic setting. °A special MedGuide will be given to you by the pharmacist with each prescription and refill. Be sure to read this information carefully each time. °Talk to your pediatrician regarding the use of this medicine in children. Special care may be needed. °Overdosage: If you think you have taken too much of this medicine contact a poison control center or emergency room at once. °NOTE: This medicine is only for you. Do not share this medicine with others. °What if I miss a dose? °It is important not to miss your dose. Call your doctor or health care professional if you are unable to keep an appointment. °What may interact with this medicine? °Do not take this medicine with any of the following medications: °-anakinra °-rilonacept °This medicine may also interact with the following medications: °-vaccines °This list may not describe all possible interactions.  Give your health care provider a list of all the medicines, herbs, non-prescription drugs, or dietary supplements you use. Also tell them if you smoke, drink alcohol, or use illegal drugs. Some items may interact with your medicine. °What should I watch for while using this medicine? °Visit your doctor or health care professional for regular checks on your progress. °If you get a cold or other infection while receiving this medicine, call your doctor or health care professional. Do not treat yourself. This medicine may decrease your body's ability to fight infections. Before beginning therapy, your doctor may do a test to see if you have been exposed to tuberculosis. °This medicine may make the symptoms of heart failure worse in some patients. If you notice symptoms such as increased shortness of breath or swelling of the ankles or legs, contact your health care provider right away. °If you are going to have surgery or dental work, tell your health care professional or dentist that you have received this medicine. °If you take this medicine for plaque psoriasis, stay out of the sun. If you cannot avoid being in the sun, wear protective clothing and use sunscreen. Do not use sun lamps or tanning beds/booths. °What side effects may I notice from receiving this medicine? °Side effects that you should report to your doctor or health care professional as soon as possible: °-allergic reactions like skin rash, itching or hives, swelling of the face, lips, or tongue °-chest pain °-fever or chills, usually related to the infusion °-muscle or joint pain °-red, scaly patches or raised bumps on the skin °-signs of infection - fever or chills, cough, sore throat, pain or difficulty passing urine °-swollen lymph nodes   in the neck, underarm, or groin areas °-unexplained weight loss °-unusual bleeding or bruising °-unusually weak or tired °-yellowing of the eyes or skin °Side effects that usually do not require medical attention  (report to your doctor or health care professional if they continue or are bothersome): °-headache °-heartburn or stomach pain °-nausea, vomiting °This list may not describe all possible side effects. Call your doctor for medical advice about side effects. You may report side effects to FDA at 1-800-FDA-1088. °Where should I keep my medicine? °This drug is given in a hospital or clinic and will not be stored at home. °NOTE: This sheet is a summary. It may not cover all possible information. If you have questions about this medicine, talk to your doctor, pharmacist, or health care provider. °© 2015, Elsevier/Gold Standard. (2008-04-07 10:26:02) ° °

## 2015-02-16 DIAGNOSIS — Z1239 Encounter for other screening for malignant neoplasm of breast: Secondary | ICD-10-CM | POA: Insufficient documentation

## 2015-02-16 DIAGNOSIS — L732 Hidradenitis suppurativa: Secondary | ICD-10-CM | POA: Insufficient documentation

## 2015-03-01 ENCOUNTER — Encounter (HOSPITAL_COMMUNITY)
Admission: RE | Admit: 2015-03-01 | Discharge: 2015-03-01 | Disposition: A | Discharge status: Home / Self Care | Payer: 59 | Source: Ambulatory Visit | Attending: Internal Medicine | Admitting: Internal Medicine

## 2015-03-01 ENCOUNTER — Encounter (HOSPITAL_COMMUNITY): Payer: Self-pay

## 2015-03-01 DIAGNOSIS — L4050 Arthropathic psoriasis, unspecified: Secondary | ICD-10-CM | POA: Insufficient documentation

## 2015-03-01 MED ORDER — SODIUM CHLORIDE 0.9 % IV SOLN
INTRAVENOUS | Status: DC
  Administered 2015-03-01: 09:00:00 via INTRAVENOUS

## 2015-03-01 MED ORDER — SODIUM CHLORIDE 0.9 % IV SOLN
5.0000 mg/kg | Freq: Once | INTRAVENOUS | Status: AC
  Administered 2015-03-01: 500 mg via INTRAVENOUS
  Filled 2015-03-01: qty 50

## 2015-03-01 MED ORDER — FAMOTIDINE 20 MG PO TABS
20.0000 mg | ORAL_TABLET | ORAL | Status: DC | PRN
  Administered 2015-03-01: 20 mg via ORAL
  Filled 2015-03-01: qty 1

## 2015-03-01 MED ORDER — ACETAMINOPHEN 325 MG PO TABS
650.0000 mg | ORAL_TABLET | ORAL | Status: DC | PRN
  Administered 2015-03-01: 650 mg via ORAL
  Filled 2015-03-01: qty 2

## 2015-03-01 NOTE — Discharge Instructions (Signed)
Infliximab injection °What is this medicine? °INFLIXIMAB (in FLIX i mab) is used to treat Crohn's disease and ulcerative colitis. It is also used to treat ankylosing spondylitis, psoriasis, and some forms of arthritis. °This medicine may be used for other purposes; ask your health care provider or pharmacist if you have questions. °COMMON BRAND NAME(S): Remicade °What should I tell my health care provider before I take this medicine? °They need to know if you have any of these conditions: °-diabetes °-exposure to tuberculosis °-heart failure °-hepatitis or liver disease °-immune system problems °-infection °-lung or breathing disease, like COPD °-multiple sclerosis °-current or past resident of [place] °-seizure disorder °-an unusual or allergic reaction to infliximab, mouse proteins, other medicines, foods, dyes, or preservatives °-pregnant or trying to get pregnant °-breast-feeding °How should I use this medicine? °This medicine is for injection into a vein. It is usually given by a health care professional in a hospital or clinic setting. °A special MedGuide will be given to you by the pharmacist with each prescription and refill. Be sure to read this information carefully each time. °Talk to your pediatrician regarding the use of this medicine in children. Special care may be needed. °Overdosage: If you think you have taken too much of this medicine contact a poison control center or emergency room at once. °NOTE: This medicine is only for you. Do not share this medicine with others. °What if I miss a dose? °It is important not to miss your dose. Call your doctor or health care professional if you are unable to keep an appointment. °What may interact with this medicine? °Do not take this medicine with any of the following medications: °-anakinra °-rilonacept °This medicine may also interact with the following medications: °-vaccines °This list may not describe all possible interactions.  Give your health care provider a list of all the medicines, herbs, non-prescription drugs, or dietary supplements you use. Also tell them if you smoke, drink alcohol, or use illegal drugs. Some items may interact with your medicine. °What should I watch for while using this medicine? °Visit your doctor or health care professional for regular checks on your progress. °If you get a cold or other infection while receiving this medicine, call your doctor or health care professional. Do not treat yourself. This medicine may decrease your body's ability to fight infections. Before beginning therapy, your doctor may do a test to see if you have been exposed to tuberculosis. °This medicine may make the symptoms of heart failure worse in some patients. If you notice symptoms such as increased shortness of breath or swelling of the ankles or legs, contact your health care provider right away. °If you are going to have surgery or dental work, tell your health care professional or dentist that you have received this medicine. °If you take this medicine for plaque psoriasis, stay out of the sun. If you cannot avoid being in the sun, wear protective clothing and use sunscreen. Do not use sun lamps or tanning beds/booths. °What side effects may I notice from receiving this medicine? °Side effects that you should report to your doctor or health care professional as soon as possible: °-allergic reactions like skin rash, itching or hives, swelling of the face, lips, or tongue °-chest pain °-fever or chills, usually related to the infusion °-muscle or joint pain °-red, scaly patches or raised bumps on the skin °-signs of infection - fever or chills, cough, sore throat, pain or difficulty passing urine °-swollen lymph nodes   in the neck, underarm, or groin areas °-unexplained weight loss °-unusual bleeding or bruising °-unusually weak or tired °-yellowing of the eyes or skin °Side effects that usually do not require medical attention  (report to your doctor or health care professional if they continue or are bothersome): °-headache °-heartburn or stomach pain °-nausea, vomiting °This list may not describe all possible side effects. Call your doctor for medical advice about side effects. You may report side effects to FDA at 1-800-FDA-1088. °Where should I keep my medicine? °This drug is given in a hospital or clinic and will not be stored at home. °NOTE: This sheet is a summary. It may not cover all possible information. If you have questions about this medicine, talk to your doctor, pharmacist, or health care provider. °© 2015, Elsevier/Gold Standard. (2008-04-07 10:26:02) ° °

## 2015-03-01 NOTE — Progress Notes (Signed)
Uneventful infusion of REMICADE Week 6 of induction. Next scheduled appt is in 8 weeks on 04/26/15 and we will need new orders for that infusion. Pt states she has an appointment on 03/03/15 with Dr Amil Amen and" is thinking the interval between infusion might change". We have asked her to call Short Stay and have Dr Valora Piccolo office call Short Stay for any changes. Pt verbalized understanding.

## 2015-04-13 ENCOUNTER — Encounter (HOSPITAL_COMMUNITY)
Admission: RE | Admit: 2015-04-13 | Discharge: 2015-04-13 | Disposition: A | Discharge status: Home / Self Care | Payer: 59 | Source: Ambulatory Visit | Attending: Internal Medicine | Admitting: Internal Medicine

## 2015-04-13 ENCOUNTER — Other Ambulatory Visit (HOSPITAL_COMMUNITY): Payer: Self-pay | Admitting: Rheumatology

## 2015-04-13 ENCOUNTER — Encounter (INDEPENDENT_AMBULATORY_CARE_PROVIDER_SITE_OTHER): Payer: Self-pay

## 2015-04-13 DIAGNOSIS — L4050 Arthropathic psoriasis, unspecified: Secondary | ICD-10-CM | POA: Insufficient documentation

## 2015-04-13 MED ORDER — SODIUM CHLORIDE 0.9 % IV SOLN: INTRAVENOUS | Status: DC

## 2015-04-13 MED ORDER — SODIUM CHLORIDE 0.9 % IV SOLN
5.0000 mg/kg | INTRAVENOUS | Status: DC
  Administered 2015-04-13: 500 mg via INTRAVENOUS
  Filled 2015-04-13: qty 50

## 2015-04-13 MED ORDER — FAMOTIDINE 20 MG PO TABS
20.0000 mg | ORAL_TABLET | ORAL | Status: DC
  Administered 2015-04-13: 20 mg via ORAL
  Filled 2015-04-13: qty 1

## 2015-04-13 MED ORDER — ACETAMINOPHEN 325 MG PO TABS
650.0000 mg | ORAL_TABLET | ORAL | Status: DC
  Administered 2015-04-13: 650 mg via ORAL
  Filled 2015-04-13: qty 2

## 2015-04-26 ENCOUNTER — Encounter (HOSPITAL_COMMUNITY): Payer: 59

## 2015-05-26 ENCOUNTER — Encounter (HOSPITAL_COMMUNITY): Payer: Self-pay

## 2015-05-26 ENCOUNTER — Encounter (HOSPITAL_COMMUNITY)
Admission: RE | Admit: 2015-05-26 | Discharge: 2015-05-26 | Disposition: A | Discharge status: Home / Self Care | Payer: 59 | Source: Ambulatory Visit | Attending: Rheumatology | Admitting: Rheumatology

## 2015-05-26 DIAGNOSIS — L4050 Arthropathic psoriasis, unspecified: Secondary | ICD-10-CM | POA: Insufficient documentation

## 2015-05-26 MED ORDER — SODIUM CHLORIDE 0.9 % IV SOLN
5.0000 mg/kg | INTRAVENOUS | Status: DC
  Filled 2015-05-26: qty 50

## 2015-05-26 MED ORDER — ACETAMINOPHEN 325 MG PO TABS: 650.0000 mg | ORAL_TABLET | ORAL | Status: DC

## 2015-05-26 MED ORDER — FAMOTIDINE 20 MG PO TABS: 20.0000 mg | ORAL_TABLET | ORAL | Status: DC

## 2015-05-26 MED ORDER — SODIUM CHLORIDE 0.9 % IV SOLN: INTRAVENOUS | Status: DC

## 2015-05-26 NOTE — Progress Notes (Signed)
Pt arrived to Short Stay for her every 6 week REMICADE infusion. She states Dr Amil Amen is not her Dr now and she will be seeing Dr Stark Bray but has not had an appointment with her yet. Currently we only have orders from Dr Melissa Noon PA Leafy Kindle and she and Dr Nicola Police are no longer at [facility]. I explained to patient we would call Dr Audelia Hives office and request orders for today's infusion. Gershon Crane sec/tech for Short Stay spoke with Threasa Beards at [facility] and it was explained to [place] that patients pre authorization for infusion per her insurance is currently only for the office and not for Short Stay. This was explained to the patient by [facility] and patient very upset and crying stating she does not have the co-pay money for the infusion at the office. Melanie scheduled patient for an appointment on 06/02/15 at 1030 to see Dr Dossie Der and we asked if Threasa Beards could get a preauthorization for patient to be infused at Short Stay ASAP and she states she will be working on this. We explained to patient that we would schedule an appointment for her REMICADE after her appointment with Dr Dossie Der as quickly as we could after the preautherization. Pt verbalized understanding

## 2015-06-08 ENCOUNTER — Encounter (HOSPITAL_COMMUNITY): Payer: Self-pay

## 2015-06-08 ENCOUNTER — Encounter (HOSPITAL_COMMUNITY)
Admission: RE | Admit: 2015-06-08 | Discharge: 2015-06-08 | Disposition: A | Discharge status: Home / Self Care | Payer: 59 | Source: Ambulatory Visit | Attending: Internal Medicine | Admitting: Internal Medicine

## 2015-06-08 DIAGNOSIS — L4050 Arthropathic psoriasis, unspecified: Secondary | ICD-10-CM | POA: Insufficient documentation

## 2015-06-08 MED ORDER — SODIUM CHLORIDE 0.9 % IV SOLN
INTRAVENOUS | Status: DC
  Administered 2015-06-08: 10:00:00 via INTRAVENOUS

## 2015-06-08 MED ORDER — SODIUM CHLORIDE 0.9 % IV SOLN
5.0000 mg/kg | INTRAVENOUS | Status: DC
  Administered 2015-06-08: 500 mg via INTRAVENOUS
  Filled 2015-06-08: qty 50

## 2015-06-08 MED ORDER — ACETAMINOPHEN 325 MG PO TABS
650.0000 mg | ORAL_TABLET | ORAL | Status: DC
  Administered 2015-06-08: 650 mg via ORAL
  Filled 2015-06-08: qty 2

## 2015-06-08 MED ORDER — FAMOTIDINE 20 MG PO TABS
20.0000 mg | ORAL_TABLET | ORAL | Status: DC
  Administered 2015-06-08: 20 mg via ORAL
  Filled 2015-06-08: qty 1

## 2015-06-08 NOTE — Progress Notes (Signed)
Call into MD office.patient under impression the dose of remicade was to be changed by MD and that she is to continue to come every 6 weeks

## 2015-07-07 ENCOUNTER — Encounter (HOSPITAL_COMMUNITY): Payer: 59

## 2015-07-21 ENCOUNTER — Encounter (HOSPITAL_COMMUNITY)
Admission: RE | Admit: 2015-07-21 | Discharge: 2015-07-21 | Disposition: A | Discharge status: Home / Self Care | Payer: 59 | Source: Ambulatory Visit | Attending: Internal Medicine | Admitting: Internal Medicine

## 2015-07-21 DIAGNOSIS — L4050 Arthropathic psoriasis, unspecified: Secondary | ICD-10-CM | POA: Diagnosis not present

## 2015-07-21 MED ORDER — SODIUM CHLORIDE 0.9 % IV SOLN
INTRAVENOUS | Status: DC
  Administered 2015-07-21: 250 mL via INTRAVENOUS

## 2015-07-21 MED ORDER — SODIUM CHLORIDE 0.9 % IV SOLN
600.0000 mg | INTRAVENOUS | Status: DC
  Administered 2015-07-21: 600 mg via INTRAVENOUS
  Filled 2015-07-21: qty 60

## 2015-07-21 MED ORDER — FAMOTIDINE 20 MG PO TABS
20.0000 mg | ORAL_TABLET | ORAL | Status: DC
  Administered 2015-07-21: 20 mg via ORAL
  Filled 2015-07-21: qty 1

## 2015-07-21 MED ORDER — ACETAMINOPHEN 325 MG PO TABS
650.0000 mg | ORAL_TABLET | ORAL | Status: DC
  Administered 2015-07-21: 650 mg via ORAL
  Filled 2015-07-21: qty 2

## 2015-07-21 MED ORDER — INFLIXIMAB 100 MG IV SOLR
5.0000 mg/kg | INTRAVENOUS | Status: DC
  Filled 2015-07-21: qty 50

## 2015-07-21 NOTE — Progress Notes (Signed)
Uneventful infusion of 600mg  REMICADE as ordered. VSS and pt voiced no c/o. At the end of the infusion pt appeared flushed but she states she" felt fine" and is afebrile denies any itching or c/o. Pt discharged ambulatory unaccompanied to elevator

## 2015-08-18 ENCOUNTER — Encounter (HOSPITAL_COMMUNITY): Payer: 59

## 2015-09-01 ENCOUNTER — Encounter (HOSPITAL_COMMUNITY): Payer: BLUE CROSS/BLUE SHIELD

## 2015-09-06 ENCOUNTER — Encounter (HOSPITAL_COMMUNITY): Payer: Self-pay

## 2015-09-06 ENCOUNTER — Encounter (HOSPITAL_COMMUNITY)
Admission: RE | Admit: 2015-09-06 | Discharge: 2015-09-06 | Disposition: A | Discharge status: Home / Self Care | Payer: BLUE CROSS/BLUE SHIELD | Source: Ambulatory Visit | Attending: Internal Medicine | Admitting: Internal Medicine

## 2015-09-06 DIAGNOSIS — L4050 Arthropathic psoriasis, unspecified: Secondary | ICD-10-CM | POA: Insufficient documentation

## 2015-09-06 MED ORDER — FAMOTIDINE 20 MG PO TABS
20.0000 mg | ORAL_TABLET | ORAL | Status: DC
  Administered 2015-09-06: 20 mg via ORAL
  Filled 2015-09-06: qty 1

## 2015-09-06 MED ORDER — SODIUM CHLORIDE 0.9 % IV SOLN
600.0000 mg | INTRAVENOUS | Status: DC
  Administered 2015-09-06: 600 mg via INTRAVENOUS
  Filled 2015-09-06: qty 60

## 2015-09-06 MED ORDER — SODIUM CHLORIDE 0.9 % IV SOLN
INTRAVENOUS | Status: DC
  Administered 2015-09-06: 11:00:00 via INTRAVENOUS

## 2015-09-06 MED ORDER — ACETAMINOPHEN 325 MG PO TABS
650.0000 mg | ORAL_TABLET | ORAL | Status: DC
  Administered 2015-09-06: 650 mg via ORAL
  Filled 2015-09-06: qty 2

## 2015-09-06 NOTE — Progress Notes (Signed)
Tolerated Remicade treatment as ordered

## 2015-09-09 DIAGNOSIS — M79671 Pain in right foot: Secondary | ICD-10-CM | POA: Insufficient documentation

## 2015-10-18 ENCOUNTER — Encounter (HOSPITAL_COMMUNITY)
Admission: RE | Admit: 2015-10-18 | Discharge: 2015-10-18 | Disposition: A | Discharge status: Home / Self Care | Payer: BLUE CROSS/BLUE SHIELD | Source: Ambulatory Visit | Attending: Internal Medicine | Admitting: Internal Medicine

## 2015-10-18 ENCOUNTER — Encounter (HOSPITAL_COMMUNITY): Payer: Self-pay

## 2015-10-18 DIAGNOSIS — L4050 Arthropathic psoriasis, unspecified: Secondary | ICD-10-CM | POA: Insufficient documentation

## 2015-10-18 MED ORDER — ACETAMINOPHEN 325 MG PO TABS
650.0000 mg | ORAL_TABLET | ORAL | Status: DC
  Administered 2015-10-18: 650 mg via ORAL
  Filled 2015-10-18: qty 2

## 2015-10-18 MED ORDER — SODIUM CHLORIDE 0.9 % IV SOLN
INTRAVENOUS | Status: DC
Start: 2015-10-18 — End: 2015-10-19
  Administered 2015-10-18: 08:00:00 via INTRAVENOUS

## 2015-10-18 MED ORDER — FAMOTIDINE 20 MG PO TABS
20.0000 mg | ORAL_TABLET | ORAL | Status: DC
  Administered 2015-10-18: 20 mg via ORAL
  Filled 2015-10-18: qty 1

## 2015-10-18 MED ORDER — SODIUM CHLORIDE 0.9 % IV SOLN
600.0000 mg | INTRAVENOUS | Status: AC
  Administered 2015-10-18: 600 mg via INTRAVENOUS
  Filled 2015-10-18: qty 60

## 2015-11-29 ENCOUNTER — Encounter (HOSPITAL_COMMUNITY): Payer: BLUE CROSS/BLUE SHIELD

## 2015-12-20 DIAGNOSIS — Z96649 Presence of unspecified artificial hip joint: Secondary | ICD-10-CM | POA: Insufficient documentation

## 2016-01-02 ENCOUNTER — Encounter (HOSPITAL_COMMUNITY): Admission: RE | Admit: 2016-01-02 | Payer: BLUE CROSS/BLUE SHIELD | Source: Ambulatory Visit

## 2016-01-03 DIAGNOSIS — T8130XA Disruption of wound, unspecified, initial encounter: Secondary | ICD-10-CM | POA: Insufficient documentation

## 2016-01-03 DIAGNOSIS — L03116 Cellulitis of left lower limb: Secondary | ICD-10-CM | POA: Insufficient documentation

## 2016-01-19 ENCOUNTER — Encounter (HOSPITAL_COMMUNITY): Payer: BLUE CROSS/BLUE SHIELD

## 2016-01-27 DIAGNOSIS — Z792 Long term (current) use of antibiotics: Secondary | ICD-10-CM | POA: Insufficient documentation

## 2016-01-27 DIAGNOSIS — R748 Abnormal levels of other serum enzymes: Secondary | ICD-10-CM | POA: Insufficient documentation

## 2016-02-17 DIAGNOSIS — Z124 Encounter for screening for malignant neoplasm of cervix: Secondary | ICD-10-CM | POA: Insufficient documentation

## 2016-03-13 DIAGNOSIS — M5459 Other low back pain: Secondary | ICD-10-CM | POA: Insufficient documentation

## 2016-03-14 ENCOUNTER — Encounter (HOSPITAL_COMMUNITY)
Admission: RE | Admit: 2016-03-14 | Discharge: 2016-03-14 | Disposition: A | Discharge status: Home / Self Care | Payer: BLUE CROSS/BLUE SHIELD | Source: Ambulatory Visit | Attending: Rheumatology | Admitting: Rheumatology

## 2016-06-08 ENCOUNTER — Other Ambulatory Visit: Payer: Self-pay | Admitting: Rheumatology

## 2016-06-08 DIAGNOSIS — M79671 Pain in right foot: Secondary | ICD-10-CM

## 2016-06-21 ENCOUNTER — Ambulatory Visit
Admission: RE | Admit: 2016-06-21 | Discharge: 2016-06-21 | Disposition: A | Discharge status: Home / Self Care | Payer: BLUE CROSS/BLUE SHIELD | Source: Ambulatory Visit | Attending: Rheumatology | Admitting: Rheumatology

## 2016-06-21 DIAGNOSIS — M79671 Pain in right foot: Secondary | ICD-10-CM

## 2016-10-11 DIAGNOSIS — R7301 Impaired fasting glucose: Secondary | ICD-10-CM | POA: Insufficient documentation

## 2016-10-11 DIAGNOSIS — R739 Hyperglycemia, unspecified: Secondary | ICD-10-CM | POA: Insufficient documentation

## 2016-10-12 DIAGNOSIS — E876 Hypokalemia: Secondary | ICD-10-CM | POA: Insufficient documentation

## 2016-10-25 ENCOUNTER — Ambulatory Visit: Payer: Self-pay | Admitting: Podiatry

## 2016-11-09 DIAGNOSIS — M67471 Ganglion, right ankle and foot: Secondary | ICD-10-CM | POA: Insufficient documentation

## 2016-12-04 DIAGNOSIS — M659 Synovitis and tenosynovitis, unspecified: Secondary | ICD-10-CM | POA: Insufficient documentation

## 2016-12-27 DIAGNOSIS — Z91199 Patient's noncompliance with other medical treatment and regimen due to unspecified reason: Secondary | ICD-10-CM | POA: Insufficient documentation

## 2017-02-15 DIAGNOSIS — J45901 Unspecified asthma with (acute) exacerbation: Secondary | ICD-10-CM | POA: Insufficient documentation

## 2017-02-21 DIAGNOSIS — M7989 Other specified soft tissue disorders: Secondary | ICD-10-CM | POA: Insufficient documentation

## 2017-04-08 DIAGNOSIS — M19071 Primary osteoarthritis, right ankle and foot: Secondary | ICD-10-CM | POA: Insufficient documentation

## 2017-11-11 DIAGNOSIS — T8452XA Infection and inflammatory reaction due to internal left hip prosthesis, initial encounter: Secondary | ICD-10-CM | POA: Diagnosis present

## 2017-12-24 DIAGNOSIS — D649 Anemia, unspecified: Secondary | ICD-10-CM | POA: Insufficient documentation

## 2018-01-17 DIAGNOSIS — E669 Obesity, unspecified: Secondary | ICD-10-CM | POA: Insufficient documentation

## 2018-07-08 DIAGNOSIS — M75121 Complete rotator cuff tear or rupture of right shoulder, not specified as traumatic: Secondary | ICD-10-CM | POA: Insufficient documentation

## 2018-08-04 DIAGNOSIS — F411 Generalized anxiety disorder: Secondary | ICD-10-CM | POA: Insufficient documentation

## 2018-08-25 ENCOUNTER — Telehealth: Payer: Self-pay | Admitting: Physical Therapy

## 2018-08-25 NOTE — Telephone Encounter (Signed)
Message left for [name] [name] to call to give some information about apparent shoulder surgery in November.  This may impact the timing of FCE scheduling

## 2018-08-26 ENCOUNTER — Telehealth: Payer: Self-pay | Admitting: Physical Therapy

## 2018-08-26 NOTE — Telephone Encounter (Signed)
Tried to contact pt today and message left for her to call clinic about FCE scheduling.  Clinic # left on message

## 2018-09-01 NOTE — Telephone Encounter (Signed)
Spoke with [name] [name] about shoulder surgery and need for surgeon's clearance for FCE to be scheduled. Surgeons office was called and clearance or guidelines were  Requested.

## 2018-09-09 ENCOUNTER — Telehealth: Payer: Self-pay | Admitting: Physical Therapy

## 2018-09-09 NOTE — Telephone Encounter (Signed)
Left message after cell phone was not answered. Asked her to contact Her ortho surgeon about clearance for lifting during the FCE testing as they have not sent [place] any information about clearance.   Clinic phone was left on message.

## 2018-10-10 ENCOUNTER — Ambulatory Visit: Payer: BLUE CROSS/BLUE SHIELD

## 2018-10-15 ENCOUNTER — Ambulatory Visit: Payer: BLUE CROSS/BLUE SHIELD | Attending: Orthopedic Surgery

## 2018-10-15 ENCOUNTER — Other Ambulatory Visit: Payer: Self-pay

## 2018-10-15 DIAGNOSIS — L4050 Arthropathic psoriasis, unspecified: Secondary | ICD-10-CM | POA: Diagnosis not present

## 2018-10-15 NOTE — Therapy (Signed)
[place], [place], [address] Phone: [phone]   Fax:  [phone]  Physical Therapy Evaluation/FCE  Patient Details  Name: [name] [name] MRN: [id number] Date of Birth: 1971/01/15 Referring Provider (PT): Sabino Niemann, MD   Encounter Date: 10/15/2018  PT End of Session - 10/15/18 0714    Visit Number  1    Number of Visits  1    Authorization Type  BCBS    PT Start Time  0705    Activity Tolerance  Patient limited by pain    Behavior During Therapy  [facility] for tasks assessed/performed       Past Medical History:  Diagnosis Date  . Anxiety   . Coronary artery disease   . Hypertension   . Psoriatic arthritis ([place])   . Stroke [facility])     Past Surgical History:  Procedure Laterality Date  . CARPAL TUNNEL RELEASE     B/L hand  . CHOLECYSTECTOMY    . HERNIA REPAIR    . KNEE ARTHROSCOPY Bilateral 08/2014   [facility] Dr. Len Childs  . TEE WITHOUT CARDIOVERSION  01/22/2012   Procedure: TRANSESOPHAGEAL ECHOCARDIOGRAM (TEE);  Surgeon: Lelon Perla, MD;  Location: [facility] ENDOSCOPY;  Service: Cardiovascular;  Laterality: N/A;    There were no vitals filed for this visit.   Subjective Assessment - 10/15/18 1115    Subjective  [name] reports multiple pain areas and surgeries. See FCE report and chart  in [facility]          [facility] PT Assessment - 10/15/18 0001      Assessment   Medical Diagnosis  Psoriatic Arthritis    Referring Provider (PT)  Sabino Niemann, MD    Next MD Visit  Next Teusday      Precautions   Precautions  None      Restrictions   Weight Bearing Restrictions  No      Balance Screen   Has the patient fallen in the past 6 months  No                Objective measurements completed on examination: See above findings.              PT Education - 10/15/18 1118    Education Details  Possible increased soreness post FCE and to rest and medicate as appropriate and  this pain should pass in 2-4 days if it occurs    Person(s) Educated  Patient    Methods  Explanation    Comprehension  Verbalized understanding             FCE FAXED TO DR Dossie Der     Plan - 10/15/18 1120    Clinical Impression Statement  [name] was rated at sedentary level of work for 8 hours.      PT Next Visit Plan  Follow up with Dr Vickey Huger and Agree with Plan of Care  Patient       Patient will benefit from skilled therapeutic intervention in order to improve the following deficits and impairments:     Visit Diagnosis: Psoriatic arthritis [facility])     Problem List Patient Active Problem List   Diagnosis Date Noted  . Acute ischemic stroke ([place]) 01/19/2012  . Hypertension 01/19/2012  . Anxiety 01/19/2012  . Chronic knee pain 01/19/2012  . RESTLESS LEGS SYNDROME 09/05/2010  . HYPERSOMNIA 06/04/2010    Darrel Hoover  PT 10/15/2018, 11:25 AM  [place]  [facility] [address] [place], [place], [address] Phone: [phone]   Fax:  [phone]  Name: [name] [name] MRN: [id number] Date of Birth: [birth year] PHYSICAL THERAPY DISCHARGE SUMMARY  Visits from Start of Care: 1  Current functional level related to goals / functional outcomes: FCE COMPLETED AND FAXED TO MD   Remaining deficits: See FCE report in EPIC   Education / Equipment: NA Plan: Patient agrees to discharge.  Patient goals were not met. Patient is being discharged due to meeting the stated rehab goals.  ?????

## 2018-12-29 DIAGNOSIS — G253 Myoclonus: Secondary | ICD-10-CM | POA: Insufficient documentation

## 2018-12-31 DIAGNOSIS — D849 Immunodeficiency, unspecified: Secondary | ICD-10-CM | POA: Insufficient documentation

## 2019-01-01 DIAGNOSIS — E538 Deficiency of other specified B group vitamins: Secondary | ICD-10-CM | POA: Insufficient documentation

## 2019-01-02 ENCOUNTER — Telehealth: Payer: Self-pay | Admitting: Internal Medicine

## 2019-01-02 NOTE — Telephone Encounter (Signed)
Pt cld to schedule a hem appt. An appt has been scheduled for the pt to see Dr. Walden Field on 5/14 at 1050am. She's been made aware to arrive 15 minutes early.

## 2019-01-15 ENCOUNTER — Inpatient Hospital Stay: Payer: BLUE CROSS/BLUE SHIELD

## 2019-01-15 ENCOUNTER — Encounter: Payer: Self-pay | Admitting: Internal Medicine

## 2019-01-15 ENCOUNTER — Inpatient Hospital Stay: Payer: BLUE CROSS/BLUE SHIELD | Attending: Internal Medicine | Admitting: Internal Medicine

## 2019-01-15 ENCOUNTER — Other Ambulatory Visit: Payer: Self-pay

## 2019-01-15 ENCOUNTER — Other Ambulatory Visit: Payer: Self-pay | Admitting: Internal Medicine

## 2019-01-15 ENCOUNTER — Ambulatory Visit: Payer: BLUE CROSS/BLUE SHIELD

## 2019-01-15 VITALS — BP 115/73 | HR 78 | Temp 98.7°F | Resp 18 | Ht 64.0 in | Wt 197.6 lb

## 2019-01-15 DIAGNOSIS — D72828 Other elevated white blood cell count: Secondary | ICD-10-CM

## 2019-01-15 DIAGNOSIS — R7989 Other specified abnormal findings of blood chemistry: Secondary | ICD-10-CM | POA: Insufficient documentation

## 2019-01-15 DIAGNOSIS — D72829 Elevated white blood cell count, unspecified: Secondary | ICD-10-CM | POA: Diagnosis not present

## 2019-01-15 DIAGNOSIS — Z79899 Other long term (current) drug therapy: Secondary | ICD-10-CM | POA: Diagnosis not present

## 2019-01-15 DIAGNOSIS — M1990 Unspecified osteoarthritis, unspecified site: Secondary | ICD-10-CM | POA: Diagnosis not present

## 2019-01-15 DIAGNOSIS — D509 Iron deficiency anemia, unspecified: Secondary | ICD-10-CM | POA: Insufficient documentation

## 2019-01-15 DIAGNOSIS — Z800 Family history of malignant neoplasm of digestive organs: Secondary | ICD-10-CM | POA: Insufficient documentation

## 2019-01-15 DIAGNOSIS — D508 Other iron deficiency anemias: Secondary | ICD-10-CM

## 2019-01-15 DIAGNOSIS — R718 Other abnormality of red blood cells: Secondary | ICD-10-CM

## 2019-01-15 DIAGNOSIS — I10 Essential (primary) hypertension: Secondary | ICD-10-CM | POA: Diagnosis not present

## 2019-01-15 HISTORY — DX: Iron deficiency anemia, unspecified: D50.9

## 2019-01-15 LAB — CBC WITH DIFFERENTIAL (CANCER CENTER ONLY)
Abs Immature Granulocytes: 0.02 10*3/uL (ref 0.00–0.07)
Basophils Absolute: 0.1 10*3/uL (ref 0.0–0.1)
Basophils Relative: 1 %
Eosinophils Absolute: 0.4 10*3/uL (ref 0.0–0.5)
Eosinophils Relative: 5 %
HCT: 34.2 % — ABNORMAL LOW (ref 36.0–46.0)
Hemoglobin: 10.2 g/dL — ABNORMAL LOW (ref 12.0–15.0)
Immature Granulocytes: 0 %
Lymphocytes Relative: 24 %
Lymphs Abs: 1.7 10*3/uL (ref 0.7–4.0)
MCH: 23.9 pg — ABNORMAL LOW (ref 26.0–34.0)
MCHC: 29.8 g/dL — ABNORMAL LOW (ref 30.0–36.0)
MCV: 80.3 fL (ref 80.0–100.0)
Monocytes Absolute: 0.5 10*3/uL (ref 0.1–1.0)
Monocytes Relative: 7 %
Neutro Abs: 4.4 10*3/uL (ref 1.7–7.7)
Neutrophils Relative %: 63 %
Platelet Count: 547 10*3/uL — ABNORMAL HIGH (ref 150–400)
RBC: 4.26 MIL/uL (ref 3.87–5.11)
RDW: 18.3 % — ABNORMAL HIGH (ref 11.5–15.5)
WBC Count: 7 10*3/uL (ref 4.0–10.5)
nRBC: 0 % (ref 0.0–0.2)

## 2019-01-15 LAB — CMP (CANCER CENTER ONLY)
ALT: 15 U/L (ref 0–44)
AST: 16 U/L (ref 15–41)
Albumin: 3.5 g/dL (ref 3.5–5.0)
Alkaline Phosphatase: 126 U/L (ref 38–126)
Anion gap: 7 (ref 5–15)
BUN: 11 mg/dL (ref 6–20)
CO2: 30 mmol/L (ref 22–32)
Calcium: 8.9 mg/dL (ref 8.9–10.3)
Chloride: 100 mmol/L (ref 98–111)
Creatinine: 0.72 mg/dL (ref 0.44–1.00)
GFR, Est AFR Am: >60 mL/min (ref >60)
GFR, Estimated: >60 mL/min (ref >60)
Glucose, Bld: 91 mg/dL (ref 70–99)
Potassium: 3.8 mmol/L (ref 3.5–5.1)
Sodium: 137 mmol/L (ref 135–145)
Total Bilirubin: 0.3 mg/dL (ref 0.3–1.2)
Total Protein: 6.8 g/dL (ref 6.5–8.1)

## 2019-01-15 LAB — SEDIMENTATION RATE: Sed Rate: 26 mm/hr — ABNORMAL HIGH (ref 0–22)

## 2019-01-15 LAB — VITAMIN B12: Vitamin B-12: 358 pg/mL (ref 180–914)

## 2019-01-15 LAB — IRON AND TIBC
Iron: 26 ug/dL — ABNORMAL LOW (ref 41–142)
Saturation Ratios: 6 % — ABNORMAL LOW (ref 21–57)
TIBC: 400 ug/dL (ref 236–444)
UIBC: 374 ug/dL (ref 120–384)

## 2019-01-15 LAB — FOLATE: Folate: 15.9 ng/mL (ref >5.9)

## 2019-01-15 LAB — FERRITIN: Ferritin: 16 ng/mL (ref 11–307)

## 2019-01-15 LAB — LACTATE DEHYDROGENASE: LDH: 166 U/L (ref 98–192)

## 2019-01-15 NOTE — Progress Notes (Signed)
Referring Physician:  Dr. Rolan Lipa  Diagnosis [place]: CBC with Differential ([place] Only), CMP ([place] only), Lactate dehydrogenase (LDH), Sedimentation rate, Ferritin, Iron and TIBC, Vitamin B12, Folate, Serum, Methylmalonic acid, serum, SPEP with reflex to IFE, Hemoglobinopathy evaluation, BCR ABL1 FISH ([facility])  Other elevated white blood cell (WBC) count - Plan: CBC with Differential ([place] Only), CMP ([place] only), Lactate dehydrogenase (LDH), Sedimentation rate, Ferritin, Iron and TIBC, Vitamin B12, Folate, Serum, Methylmalonic acid, serum, SPEP with reflex to IFE, Hemoglobinopathy evaluation, BCR ABL1 FISH ([facility])  Staging Cancer Staging No matching staging information was found for the patient.  Assessment and Plan:  1.  Iron deficiency anemia ( IDA).  48 year old female referred for evaluation due to anemia and leucocytosis.  Pt is being treated with Orencia.  She had labs done 11/03/2018 that showed WBC 12.6 HB 9.8 plts 462,000.  She had a normal differential.  Chemistries showed K+ 3.7 Cr 0.6 and normal LFTs.  Pt denies any fevers, chills, night sweats and has noted no adenopathy.  She had taken prednisone in the past.  She reports a family history of colon cancer.  Pt is seen today for consultation due to leukocytosis and anemia.     Labs done today 01/15/2019 reviewed and showed WBC 7 HB 10.2 plts 547,000 MCV 80 Sed rate 26.  Chemistries WNL wit K+ 3.8 Cr 0.72 and normal LFTS.  Ferritin is decreased at 16.  Pt has intolerance to oral iron and is recommended for IV iron with Feraheme 510 mg IV D1 and D8.  Side effects of IV iron discussed which include potential allergic reaction.  Pt will have repeat labs in 03/2019 for follow-up after IV iron.  Pt is also recommended for GI referral due to IDA.    2.  Leucocytosis/Thrombocytosis.  WBC 7,000. Plt count is 547,000.   Awaiting BCR/ABL due to history of persistent leucocytosis. Suspect reactive  thrombocytosis due to IDA.   Counts may also be affected by Orencia.  Pt will have phone visit follow-up to go over results in 2 weeks.    3.  Arthritis.  Pt on Orencia.  Follow-up with PCP and Rheumatology as directed.    4.  Hypertension.  BP is 115/73.  Follow-up with PCP.    5.  Family history of colon cancer.  Pt reports this occurred in grandparents.  Pt is referred to GI for evaluation due to IDA.    6.  Health maintenance.  Pt is referred to GI for evaluation.  Mammogram screening per PCP.    40 minutes spent with more than 50% spent in review of records, counseling and coordination of care.    HPI:  48 year old female referred for evaluation due to anemia and leucocytosis.  Pt is being treated with Orencia.  She had labs done 11/03/2018 that showed WBC 12,6 HB 9.8 plts 462,000.  She had a normal differential.  Chemistries showed K+ 3.7 Cr 0.6 and normal LFTs.  Pt denies any fevers, chills, night sweats and has noted no adenopathy.  She had taken prednisone in the past.  She reports a family history of colon cancer.  Pt is seen today for consultation due to leukocytosis and anemia.    Problem List Patient Active Problem List   Diagnosis Date Noted  . Acute ischemic stroke ([place]) [I63.9] 01/19/2012  . Hypertension [I10] 01/19/2012  . Anxiety [F41.9] 01/19/2012  . Chronic knee pain [M25.569, G89.29] 01/19/2012  . RESTLESS  LEGS SYNDROME [G25.81] 09/05/2010  . HYPERSOMNIA [G47.10] 06/04/2010    Past Medical History Past Medical History:  Diagnosis Date  . Anxiety   . Coronary artery disease   . Hypertension   . Psoriatic arthritis ([place])   . Stroke [facility])     Past Surgical History Past Surgical History:  Procedure Laterality Date  . CARPAL TUNNEL RELEASE     B/L hand  . CHOLECYSTECTOMY    . HERNIA REPAIR    . KNEE ARTHROSCOPY Bilateral 08/2014   [facility] Dr[facility] Len Childs  . TEE WITHOUT CARDIOVERSION  01/22/2012   Procedure: TRANSESOPHAGEAL ECHOCARDIOGRAM (TEE);  Surgeon: Lelon Perla, MD;  Location: [facility] ENDOSCOPY;  Service: Cardiovascular;  Laterality: N/A;    Family History Family History  Problem Relation Age of Onset  . Hypertension Mother   . Hypertension Father     Pt reports family history of colon cancer  Social History  reports that she has never smoked. She has never used smokeless tobacco. She reports current alcohol use. She reports that she does not use drugs.  Medications  Current Outpatient Medications:  .  amoxicillin-clavulanate (AUGMENTIN) 875-125 MG tablet, Take 1 tablet by mouth 2 (two) times daily., Disp: , Rfl:  .  aspirin 325 MG tablet, Take 325 mg by mouth daily., Disp: , Rfl:  .  atorvastatin (LIPITOR) 20 MG tablet, Take 40 mg by mouth daily. , Disp: , Rfl:  .  doxycycline (VIBRAMYCIN) 100 MG capsule, Take 100 mg by mouth 2 (two) times daily., Disp: , Rfl:  .  potassium chloride SA (K-DUR) 20 MEQ tablet, Take 20 mEq by mouth 2 (two) times daily., Disp: , Rfl:  .  pregabalin (LYRICA) 75 MG capsule, Take 75 mg by mouth 2 (two) times daily., Disp: , Rfl:  .  amLODipine (NORVASC) 2.5 MG tablet, Take 1 tablet (2.5 mg total) by mouth daily. (Patient not taking: Reported on 01/18/2015), Disp: 30 tablet, Rfl: 1 .  Certolizumab Pegol (CIMZIA PREFILLED [place]), Inject 1 Applicatorful into the skin every 30 (thirty) days., Disp: , Rfl:  .  escitalopram (LEXAPRO) 20 MG tablet, Take 20 mg by mouth daily., Disp: , Rfl:  .  hydrochlorothiazide (MICROZIDE) 12.5 MG capsule, Take 12.5 mg by mouth daily., Disp: , Rfl:  .  loperamide (IMODIUM) 2 MG capsule, Take 2 mg by mouth daily as needed. For diarrhea, Disp: , Rfl:  .  losartan-hydrochlorothiazide (HYZAAR) 100-25 MG per tablet, Take 1 tablet by mouth daily., Disp: , Rfl:  .  OVER THE COUNTER MEDICATION, Inject 1 application as directed every 30 (thirty) days. cimzia, Disp: , Rfl:  .  pravastatin (PRAVACHOL) 40 MG tablet, Take 60 mg by mouth daily., Disp: , Rfl:  .  traMADol (ULTRAM-ER) 100 MG 24 hr  tablet, Take 100 mg by mouth daily as needed. For pain, Disp: , Rfl:   Allergies Nsaids  Review of Systems Review of Systems - Oncology ROS negative    Physical Exam  Vitals Wt Readings from Last 3 Encounters:  01/15/19 197 lb 9.6 oz (89.6 kg)  10/18/15 201 lb (91.2 kg)  09/06/15 209 lb (94.8 kg)   Temp Readings from Last 3 Encounters:  01/15/19 98.7 F (37.1 C) (Oral)  10/18/15 98.4 F (36.9 C) (Oral)  09/06/15 98.3 F (36.8 C) (Oral)   BP Readings from Last 3 Encounters:  01/15/19 115/73  10/18/15 123/80  09/06/15 121/75   Pulse Readings from Last 3 Encounters:  01/15/19 78  10/18/15 76  09/06/15 67  Constitutional: Well-developed, well-nourished, and in no distress.   HENT: Head: Normocephalic and atraumatic.  Mouth/Throat: No oropharyngeal exudate. Mucosa moist. Eyes: Pupils are equal, round, and reactive to light. Conjunctivae are normal. No scleral icterus.  Neck: Normal range of motion. Neck supple. No JVD present.  Cardiovascular: Normal rate, regular rhythm and normal heart sounds.  Exam reveals no gallop and no friction rub.   No murmur heard. Pulmonary/Chest: Effort normal and breath sounds normal. No respiratory distress. No wheezes.No rales.  Abdominal: Soft. Bowel sounds are normal. No distension. There is no tenderness. There is no guarding.  Musculoskeletal: No edema or tenderness.  Lymphadenopathy: No cervical,axillary or supraclavicular adenopathy.  Neurological: Alert and oriented to person, place, and time. No cranial nerve deficit.  Skin: Skin is warm and dry. No rash noted. No erythema. No pallor.  Psychiatric: Affect and judgment normal.   Labs Appointment on 01/15/2019  Component Date Value Ref Range Status  . Folate 01/15/2019 15.9  >5.9 ng/mL Final   Performed at [facility] [address], [place], [place] [address]  . Vitamin B-12 01/15/2019 358  180 - 914 pg/mL Final   Comment: (NOTE) This assay is not  validated for testing neonatal or myeloproliferative syndrome specimens for Vitamin B12 levels. Performed at [facility], [place] [address] [place], [place] [address]   . Iron 01/15/2019 26* 41 - 142 ug/dL Final  . TIBC 01/15/2019 400  236 - 444 ug/dL Final  . Saturation Ratios 01/15/2019 6* 21 - 57 % Final  . UIBC 01/15/2019 374  120 - 384 ug/dL Final   Performed at [facility], [place] [address], [place], [place] [address]  . Ferritin 01/15/2019 16  11 - 307 ng/mL Final   Performed at [facility], [place] [address], [place], [place] [address]  . Sed Rate 01/15/2019 26* 0 - 22 mm/hr Final   Performed at [facility], [place] [address], [place], [place] [address]  . LDH 01/15/2019 166  98 - 192 U/L Final   Performed at [facility], [place] [address], [place], [place] [address]  . Sodium 01/15/2019 137  135 - 145 mmol/L Final  . Potassium 01/15/2019 3.8  3.5 - 5.1 mmol/L Final  . Chloride 01/15/2019 100  98 - 111 mmol/L Final  . CO2 01/15/2019 30  22 - 32 mmol/L Final  . Glucose, Bld 01/15/2019 91  70 - 99 mg/dL Final  . BUN 01/15/2019 11  6 - 20 mg/dL Final  . Creatinine 01/15/2019 0.72  0.44 - 1.00 mg/dL Final  . Calcium 01/15/2019 8.9  8.9 - 10.3 mg/dL Final  . Total Protein 01/15/2019 6.8  6.5 - 8.1 g/dL Final  . Albumin 01/15/2019 3.5  3.5 - 5.0 g/dL Final  . AST 01/15/2019 16  15 - 41 U/L Final  . ALT 01/15/2019 15  0 - 44 U/L Final  . Alkaline Phosphatase 01/15/2019 126  38 - 126 U/L Final  . Total Bilirubin 01/15/2019 0.3  0.3 - 1.2 mg/dL Final  . GFR, Est Non Af Am 01/15/2019 >60  >60 mL/min Final  . GFR, Est AFR Am 01/15/2019 >60  >60 mL/min Final  . Anion gap 01/15/2019 7  5 - 15 Final   Performed at [facility], [place] [address], [place], [place] [address]  . WBC Count 01/15/2019 7.0  4.0 - 10.5 K/uL Final  . RBC 01/15/2019 4.26  3.87 -  5.11  MIL/uL Final  . Hemoglobin 01/15/2019 10.2* 12.0 - 15.0 g/dL Final  . HCT 01/15/2019 34.2* 36.0 - 46.0 % Final  . MCV 01/15/2019 80.3  80.0 - 100.0 fL Final  . MCH 01/15/2019 23.9* 26.0 - 34.0 pg Final  . MCHC 01/15/2019 29.8* 30.0 - 36.0 g/dL Final  . RDW 01/15/2019 18.3* 11.5 - 15.5 % Final  . Platelet Count 01/15/2019 547* 150 - 400 K/uL Final  . nRBC 01/15/2019 0.0  0.0 - 0.2 % Final  . Neutrophils Relative % 01/15/2019 63  % Final  . Neutro Abs 01/15/2019 4.4  1.7 - 7.7 K/uL Final  . Lymphocytes Relative 01/15/2019 24  % Final  . Lymphs Abs 01/15/2019 1.7  0.7 - 4.0 K/uL Final  . Monocytes Relative 01/15/2019 7  % Final  . Monocytes Absolute 01/15/2019 0.5  0.1 - 1.0 K/uL Final  . Eosinophils Relative 01/15/2019 5  % Final  . Eosinophils Absolute 01/15/2019 0.4  0.0 - 0.5 K/uL Final  . Basophils Relative 01/15/2019 1  % Final  . Basophils Absolute 01/15/2019 0.1  0.0 - 0.1 K/uL Final  . Immature Granulocytes 01/15/2019 0  % Final  . Abs Immature Granulocytes 01/15/2019 0.02  0.00 - 0.07 K/uL Final   Performed at [facility], [place] [address] [place],  [address]     Pathology Orders Placed This Encounter  Procedures  . CBC with Differential ([facility] Center Only)    Standing Status:   Future    Number of Occurrences:   1    Standing Expiration Date:   01/15/2020  . CMP ([place] only)    Standing Status:   Future    Number of Occurrences:   1    Standing Expiration Date:   01/15/2020  . Lactate dehydrogenase (LDH)    Standing Status:   Future    Number of Occurrences:   1    Standing Expiration Date:   01/15/2020  . Sedimentation rate    Standing Status:   Future    Number of Occurrences:   1    Standing Expiration Date:   01/15/2020  . Ferritin    Standing Status:   Future    Number of Occurrences:   1    Standing Expiration Date:   01/15/2020  . Iron and TIBC    Standing Status:   Future    Number of Occurrences:   1    Standing  Expiration Date:   01/15/2020  . Vitamin B12    Standing Status:   Future    Number of Occurrences:   1    Standing Expiration Date:   01/15/2020  . Folate, Serum    Standing Status:   Future    Number of Occurrences:   1    Standing Expiration Date:   01/15/2020  . Methylmalonic acid, serum    Standing Status:   Future    Number of Occurrences:   1    Standing Expiration Date:   01/15/2020  . SPEP with reflex to IFE    Standing Status:   Future    Number of Occurrences:   1    Standing Expiration Date:   01/15/2020  . Hemoglobinopathy evaluation    Standing Status:   Future    Number of Occurrences:   1    Standing Expiration Date:   01/15/2020  . BCR ABL1 FISH ([facility])    Standing Status:   Future  Number of Occurrences:   1    Standing Expiration Date:   01/15/2020       Zoila Shutter MD

## 2019-01-16 ENCOUNTER — Telehealth: Payer: Self-pay | Admitting: Internal Medicine

## 2019-01-16 LAB — PROTEIN ELECTROPHORESIS, SERUM, WITH REFLEX
A/G Ratio: 1.3 (ref 0.7–1.7)
Albumin ELP: 3.4 g/dL (ref 2.9–4.4)
Alpha-1-Globulin: 0.2 g/dL (ref 0.0–0.4)
Alpha-2-Globulin: 0.8 g/dL (ref 0.4–1.0)
Beta Globulin: 1.1 g/dL (ref 0.7–1.3)
Gamma Globulin: 0.6 g/dL (ref 0.4–1.8)
Globulin, Total: 2.7 g/dL (ref 2.2–3.9)
Total Protein ELP: 6.1 g/dL (ref 6.0–8.5)

## 2019-01-16 NOTE — Telephone Encounter (Signed)
Tried to reach regarding schedule °

## 2019-01-17 LAB — METHYLMALONIC ACID, SERUM: Methylmalonic Acid, Quantitative: 191 nmol/L (ref 0–378)

## 2019-01-19 LAB — HEMOGLOBINOPATHY EVALUATION
Hgb A2 Quant: 1.7 % — ABNORMAL LOW (ref 1.8–3.2)
Hgb A: 98.3 % (ref 96.4–98.8)
Hgb C: 0 %
Hgb F Quant: 0 % (ref 0.0–2.0)
Hgb S Quant: 0 %
Hgb Variant: 0 %

## 2019-01-20 ENCOUNTER — Encounter: Payer: Self-pay | Admitting: Gastroenterology

## 2019-01-20 ENCOUNTER — Telehealth: Payer: Self-pay | Admitting: Internal Medicine

## 2019-01-20 NOTE — Telephone Encounter (Signed)
Left message re 5/22 and 5/29 appointments. Schedule mailed.

## 2019-01-20 NOTE — Telephone Encounter (Signed)
Patient returned call and confirmed 5/22 and 5/29 infusions, as well as 5/28 telephone visit.

## 2019-01-21 LAB — BCR ABL1 FISH (GENPATH)

## 2019-01-23 ENCOUNTER — Inpatient Hospital Stay: Payer: BLUE CROSS/BLUE SHIELD

## 2019-01-23 ENCOUNTER — Other Ambulatory Visit: Payer: Self-pay

## 2019-01-23 VITALS — BP 117/71 | HR 76 | Temp 98.8°F | Resp 18

## 2019-01-23 DIAGNOSIS — D508 Other iron deficiency anemias: Secondary | ICD-10-CM

## 2019-01-23 DIAGNOSIS — D509 Iron deficiency anemia, unspecified: Secondary | ICD-10-CM | POA: Diagnosis not present

## 2019-01-23 MED ORDER — SODIUM CHLORIDE 0.9 % IV SOLN
510.0000 mg | Freq: Once | INTRAVENOUS | Status: AC
  Administered 2019-01-23: 510 mg via INTRAVENOUS
  Filled 2019-01-23: qty 17

## 2019-01-23 MED ORDER — SODIUM CHLORIDE 0.9 % IV SOLN
Freq: Once | INTRAVENOUS | Status: AC
  Administered 2019-01-23: 08:00:00 via INTRAVENOUS
  Filled 2019-01-23: qty 250

## 2019-01-23 NOTE — Patient Instructions (Signed)

## 2019-01-28 ENCOUNTER — Telehealth: Payer: Self-pay | Admitting: Internal Medicine

## 2019-01-28 NOTE — Telephone Encounter (Signed)
Left voicemail to confirm appt and verify information.

## 2019-01-28 NOTE — Telephone Encounter (Signed)
Called and confirmed appt and verified information for tomorrow.

## 2019-01-29 ENCOUNTER — Inpatient Hospital Stay (HOSPITAL_BASED_OUTPATIENT_CLINIC_OR_DEPARTMENT_OTHER): Payer: BLUE CROSS/BLUE SHIELD | Admitting: Internal Medicine

## 2019-01-29 DIAGNOSIS — Z79899 Other long term (current) drug therapy: Secondary | ICD-10-CM | POA: Diagnosis not present

## 2019-01-29 DIAGNOSIS — Z800 Family history of malignant neoplasm of digestive organs: Secondary | ICD-10-CM

## 2019-01-29 DIAGNOSIS — I10 Essential (primary) hypertension: Secondary | ICD-10-CM

## 2019-01-29 DIAGNOSIS — D508 Other iron deficiency anemias: Secondary | ICD-10-CM | POA: Diagnosis not present

## 2019-01-29 NOTE — Progress Notes (Signed)
Virtual Visit via Telephone Note  I connected with [name] [name] on 01/29/19 at  2:40 PM EDT by telephone and verified that I am speaking with the correct person using two identifiers.   I discussed the limitations, risks, security and privacy concerns of performing an evaluation and management service by telephone and the availability of in person appointments. I also discussed with the patient that there may be a patient responsible charge related to this service. The patient expressed understanding and agreed to proceed.  Interval History:  Historical data obtained from note dated 01/15/2019  Observations/Objective: Review of labs from 01/15/2019   Assessment and Plan:1.  Iron deficiency anemia ( IDA).  48 year old female referred for evaluation due to anemia and leucocytosis.  Pt is being treated with Orencia.  She had labs done 11/03/2018 that showed WBC 12.6 HB 9.8 plts 462,000.  She had a normal differential.  Chemistries showed K+ 3.7 Cr 0.6 and normal LFTs.  Pt denies any fevers, chills, night sweats and has noted no adenopathy.  She had taken prednisone in the past.  She reports a family history of colon cancer.   Labs done 01/15/2019 reviewed and showed WBC 7 HB 10.2 plts 547,000 MCV 80 Sed rate 26.  Chemistries WNL wit K+ 3.8 Cr 0.72 and normal LFTS.  Ferritin is decreased at 16.  Pt has intolerance to oral iron and is recommended for IV iron with Feraheme 510 mg IV D1 and D8.  Pt was treated with IV iron on 01/23/2019.  She is due for 2nd dose on 01/30/2019. Pt will have repeat labs in 03/2019 for follow-up after IV iron.  Pt was also recommended for GI referral due to IDA.    2.  Leucocytosis/Thrombocytosis.  WBC 7,000. Plt count is 547,000.   Pt has negative BCR/ABL.  Suspect reactive thrombocytosis due to IDA.   Counts may also be affected by Orencia.  Will repeat labs in 03/2019.   3.  Arthritis.  Pt on Orencia.  Follow-up with PCP and Rheumatology as directed.    4.  Hypertension.   BP was 115/73.  Follow-up with PCP.    5.  Family history of colon cancer.  Pt reports this occurred in grandparents.  Pt was referred to GI for evaluation due to IDA.    6.  Health maintenance.  Pt was referred to GI for evaluation.  Mammogram screening per PCP.    Follow Up Instructions:  RTC in 03/2019 with labs.      I discussed the assessment and treatment plan with the patient. The patient was provided an opportunity to ask questions and all were answered. The patient agreed with the plan and demonstrated an understanding of the instructions.   The patient was advised to call back or seek an in-person evaluation if the symptoms worsen or if the condition fails to improve as anticipated.  I provided 15 minutes of non-face-to-face time during this encounter.   Zoila Shutter, MD

## 2019-01-30 ENCOUNTER — Other Ambulatory Visit: Payer: Self-pay

## 2019-01-30 ENCOUNTER — Inpatient Hospital Stay: Payer: BLUE CROSS/BLUE SHIELD

## 2019-01-30 VITALS — BP 100/62 | HR 71 | Temp 98.6°F | Resp 16

## 2019-01-30 DIAGNOSIS — D508 Other iron deficiency anemias: Secondary | ICD-10-CM

## 2019-01-30 DIAGNOSIS — D509 Iron deficiency anemia, unspecified: Secondary | ICD-10-CM | POA: Diagnosis not present

## 2019-01-30 MED ORDER — SODIUM CHLORIDE 0.9 % IV SOLN
Freq: Once | INTRAVENOUS | Status: AC
  Administered 2019-01-30: 09:00:00 via INTRAVENOUS
  Filled 2019-01-30: qty 250

## 2019-01-30 MED ORDER — SODIUM CHLORIDE 0.9 % IV SOLN
510.0000 mg | Freq: Once | INTRAVENOUS | Status: AC
  Administered 2019-01-30: 510 mg via INTRAVENOUS
  Filled 2019-01-30: qty 510

## 2019-01-30 NOTE — Patient Instructions (Signed)
Ferumoxytol injection What is this medicine? FERUMOXYTOL is an iron complex. Iron is used to make healthy red blood cells, which carry oxygen and nutrients throughout the body. This medicine is used to treat iron deficiency anemia. This medicine may be used for other purposes; ask your health care provider or pharmacist if you have questions. COMMON BRAND NAME(S): Feraheme What should I tell my health care provider before I take this medicine? They need to know if you have any of these conditions: -anemia not caused by low iron levels -high levels of iron in the blood -magnetic resonance imaging (MRI) test scheduled -an unusual or allergic reaction to iron, other medicines, foods, dyes, or preservatives -pregnant or trying to get pregnant -breast-feeding How should I use this medicine? This medicine is for injection into a vein. It is given by a health care professional in a hospital or clinic setting. Talk to your pediatrician regarding the use of this medicine in children. Special care may be needed. Overdosage: If you think you have taken too much of this medicine contact a poison control center or emergency room at once. NOTE: This medicine is only for you. Do not share this medicine with others. What if I miss a dose? It is important not to miss your dose. Call your doctor or health care professional if you are unable to keep an appointment. What may interact with this medicine? This medicine may interact with the following medications: -other iron products This list may not describe all possible interactions. Give your health care provider a list of all the medicines, herbs, non-prescription drugs, or dietary supplements you use. Also tell them if you smoke, drink alcohol, or use illegal drugs. Some items may interact with your medicine. What should I watch for while using this medicine? Visit your doctor or healthcare professional regularly. Tell your doctor or healthcare professional  if your symptoms do not start to get better or if they get worse. You may need blood work done while you are taking this medicine. You may need to follow a special diet. Talk to your doctor. Foods that contain iron include: whole grains/cereals, dried fruits, beans, or peas, leafy green vegetables, and organ meats (liver, kidney). What side effects may I notice from receiving this medicine? Side effects that you should report to your doctor or health care professional as soon as possible: -allergic reactions like skin rash, itching or hives, swelling of the face, lips, or tongue -breathing problems -changes in blood pressure -feeling faint or lightheaded, falls -fever or chills -flushing, sweating, or hot feelings -swelling of the ankles or feet Side effects that usually do not require medical attention (report to your doctor or health care professional if they continue or are bothersome): -diarrhea -headache -nausea, vomiting -stomach pain This list may not describe all possible side effects. Call your doctor for medical advice about side effects. You may report side effects to FDA at 1-800-FDA-1088. Where should I keep my medicine? This drug is given in a hospital or clinic and will not be stored at home. NOTE: This sheet is a summary. It may not cover all possible information. If you have questions about this medicine, talk to your doctor, pharmacist, or health care provider.  2019 Elsevier/Gold Standard (2016-10-08 20:21:10)  Coronavirus (COVID-19) Are you at risk?  Are you at risk for the Coronavirus (COVID-19)?  To be considered HIGH RISK for Coronavirus (COVID-19), you have to meet the following criteria:  . Traveled to [place], [place], [place], [place] or   [place]; or in the [place] to [place], [place], [place], or [place]; and have fever, cough, and shortness of breath within the last 2 weeks of travel OR . Been in close contact with a person diagnosed with COVID-19  within the last 2 weeks and have fever, cough, and shortness of breath . IF YOU DO NOT MEET THESE CRITERIA, YOU ARE CONSIDERED LOW RISK FOR COVID-19.  What to do if you are HIGH RISK for COVID-19?  . If you are having a medical emergency, call 911. . Seek medical care right away. Before you go to a doctor's office, urgent care or emergency department, call ahead and tell them about your recent travel, contact with someone diagnosed with COVID-19, and your symptoms. You should receive instructions from your physician's office regarding next steps of care.  . When you arrive at healthcare provider, tell the healthcare staff immediately you have returned from visiting [place], [place], [place], [place] or [place]; or traveled in the [place] to [place], [place], [place], or [place]; in the last two weeks or you have been in close contact with a person diagnosed with COVID-19 in the last 2 weeks.   . Tell the health care staff about your symptoms: fever, cough and shortness of breath. . After you have been seen by a medical provider, you will be either: o Tested for (COVID-19) and discharged home on quarantine except to seek medical care if symptoms worsen, and asked to  - Stay home and avoid contact with others until you get your results (4-5 days)  - Avoid travel on public transportation if possible (such as bus, train, or airplane) or o Sent to the Emergency Department by EMS for evaluation, COVID-19 testing, and possible admission depending on your condition and test results.  What to do if you are LOW RISK for COVID-19?  Reduce your risk of any infection by using the same precautions used for avoiding the common cold or flu:  . Wash your hands often with soap and warm water for at least 20 seconds.  If soap and water are not readily available, use an alcohol-based hand sanitizer with at least 60% alcohol.  . If coughing or sneezing, cover your mouth and nose by coughing or sneezing  into the elbow areas of your shirt or coat, into a tissue or into your sleeve (not your hands). . Avoid shaking hands with others and consider head nods or verbal greetings only. . Avoid touching your eyes, nose, or mouth with unwashed hands.  . Avoid close contact with people who are sick. . Avoid places or events with large numbers of people in one location, like concerts or sporting events. . Carefully consider travel plans you have or are making. . If you are planning any travel outside or inside the [place], visit the CDC's Travelers' Health webpage for the latest health notices. . If you have some symptoms but not all symptoms, continue to monitor at home and seek medical attention if your symptoms worsen. . If you are having a medical emergency, call 911.   ADDITIONAL HEALTHCARE OPTIONS FOR PATIENTS  [facility] / e-Visit: [email or web address]         [facility]: [phone]  [facility]: [phone]                   [facility]: [phone]   

## 2019-02-03 ENCOUNTER — Ambulatory Visit: Payer: BLUE CROSS/BLUE SHIELD | Admitting: Gastroenterology

## 2019-02-11 ENCOUNTER — Encounter: Payer: Self-pay | Admitting: General Surgery

## 2019-02-13 ENCOUNTER — Telehealth: Payer: Self-pay

## 2019-02-13 ENCOUNTER — Ambulatory Visit (INDEPENDENT_AMBULATORY_CARE_PROVIDER_SITE_OTHER): Payer: BLUE CROSS/BLUE SHIELD | Admitting: Gastroenterology

## 2019-02-13 ENCOUNTER — Other Ambulatory Visit: Payer: Self-pay

## 2019-02-13 ENCOUNTER — Encounter: Payer: Self-pay | Admitting: Gastroenterology

## 2019-02-13 VITALS — Ht 64.0 in | Wt 190.0 lb

## 2019-02-13 DIAGNOSIS — Z79899 Other long term (current) drug therapy: Secondary | ICD-10-CM

## 2019-02-13 DIAGNOSIS — R140 Abdominal distension (gaseous): Secondary | ICD-10-CM

## 2019-02-13 DIAGNOSIS — D509 Iron deficiency anemia, unspecified: Secondary | ICD-10-CM

## 2019-02-13 DIAGNOSIS — K529 Noninfective gastroenteritis and colitis, unspecified: Secondary | ICD-10-CM

## 2019-02-13 MED ORDER — NA SULFATE-K SULFATE-MG SULF 17.5-3.13-1.6 GM/177ML PO SOLN: 1.0000 | Freq: Once | ORAL | 0 refills | Status: AC

## 2019-02-13 NOTE — Progress Notes (Signed)
This patient contacted our office requesting a physician telemedicine consultation regarding clinical questions and/or test results.  If new patient, they were referred by Zoila Shutter, MD (Hematology)  Participants on the conference : myself and patient   The patient consented to this consultation and was aware that a charge will be placed through their insurance.  I was in my office and the patient was at home   Encounter time:  Total time 40 minutes, with 27 minutes spent with patient on [facility]   _____________________________________________________________________________________________              [facility] Consult Note:  History: [name] [name] 02/13/2019  Referring provider: Zoila Shutter, MD (Hematology)  Reason for consult/chief complaint: Iron deficiency anemia  Subjective  HPI: Discharge summary from late April hospitalization in [facility] indicates altered mental status, hypoxia, toxicology screen positive for oxycodone (patient reportedly denied use of that medicine), and CT scan with bilateral infiltrates.  Initially on antibiotic therapy, then discontinued at the time of discharge. (she then saw her ID physician, who put her back on chronic augmentin and doxycycline for hip infection after replacement). Noted to have "anemia of chronic disease" with low iron and B12 levels.  B12 supplementation was given.  She was referred to hematology, whose follow-up note from 01/29/2019 was reviewed. Patient on immunosuppressive therapy with Orencia , CBC in early March showed WBC 12.6, hemoglobin 9.8, platelet 462 On May 14, WBC 7.0, hemoglobin 10.2, platelets 547, MCV 80, ESR 26 creatinine 0.7, ferritin low at 16.  Reported intolerance to oral iron (itching )so given IV iron twice recently  B12 358 on 01/15/2019 (after supplementation given during hospitalization)  Hospitalized for PNA Dec 2019 also.(care everywhyere notes from that would not open)    Amorina reports years of bloating and diarrhea which have been diagnosed as IBS.  She had been referred to GI in the past for the symptoms and family history of colon cancer in grandparents, but admits she did not follow through with colonoscopy. There has not been black or bloody stool, nausea, vomiting, dysphagia or weight loss.   Moves her bowels daily, and reports the constipation is not an issue, even on her chronic opiate.  ROS:  Review of Systems  Constitutional: Positive for fatigue. Negative for appetite change and unexpected weight change.  HENT: Negative for mouth sores and voice change.   Eyes: Negative for pain and redness.  Respiratory: Negative for cough and shortness of breath.   Cardiovascular: Negative for chest pain and palpitations.  Gastrointestinal: Positive for diarrhea.  Genitourinary: Negative for dysuria and hematuria.  Musculoskeletal: Positive for arthralgias, back pain and myalgias.  Skin: Negative for pallor and rash.  Neurological: Negative for weakness and headaches.  Hematological: Negative for adenopathy.     Past Medical History: Past Medical History:  Diagnosis Date  . Anxiety   . Coronary artery disease   . Hypertension   . Iron deficiency anemia 01/15/2019  . Psoriatic arthritis ([place])   . Stroke [facility])      Past Surgical History: Past Surgical History:  Procedure Laterality Date  . CARPAL TUNNEL RELEASE     B/L hand  . CHOLECYSTECTOMY    . HERNIA REPAIR    . KNEE ARTHROSCOPY Bilateral 08/2014   [facility] Dr. Len Childs  . 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 Hypertension Mother   . Hypertension Father    She believes both grandfathers had colon cancer  Social History:  Social History   Socioeconomic History  . Marital status: Married    Spouse name: Not on file  . Number of children: Not on file  . Years of education: Not on file  . Highest education level: Not on file  Occupational History  . Not on file  Social Needs  . Financial resource strain: Not on file  . Food insecurity    Worry: Not on file    Inability: Not on file  . Transportation needs    Medical: Not on file    Non-medical: Not on file  Tobacco Use  . Smoking status: Never Smoker  . Smokeless tobacco: Never Used  . Tobacco comment: quit 10 years ago, smoked intermittently for few years. Less than 10 yrs total.  Substance and Sexual Activity  . Alcohol use: Yes    Comment: occ- 1-2 drinks a week  . Drug use: No  . Sexual activity: Not on file  Lifestyle  . Physical activity    Days per week: Not on file    Minutes per session: Not on file  . Stress: Not on file  Relationships  . Social Herbalist on phone: Not on file    Gets together: Not on file    Attends religious service: Not on file    Active member of club or organization: Not on file    Attends meetings of clubs or organizations: Not on file    Relationship status: Not on file  Other Topics Concern  . Not on file  Social History Narrative   Lives in [place] with her husband.   Has 4 healthy kids.    Allergies: Allergies  Allergen Reactions  . Nsaids Other (See Comments)    stroke    Outpatient Meds: Current Outpatient Medications  Medication Sig Dispense Refill  . Abatacept (ORENCIA IV) Inject into the vein.    Marland Kitchen amoxicillin-clavulanate (AUGMENTIN) 875-125 MG tablet Take 1 tablet by mouth 2 (two) times daily.    Marland Kitchen aspirin 325 MG tablet Take 325 mg by mouth daily.    Marland Kitchen atorvastatin (LIPITOR) 20 MG tablet Take 40 mg by mouth daily.     . Buprenorphine HCl (BELBUCA BU) Place 300 mcg inside cheek.    . Certolizumab Pegol (CIMZIA PREFILLED [place]) Inject 1 Applicatorful into the skin every 30  (thirty) days.    Marland Kitchen doxycycline (VIBRAMYCIN) 100 MG capsule Take 100 mg by mouth 2 (two) times daily.    Marland Kitchen escitalopram (LEXAPRO) 20 MG tablet Take 20 mg by mouth daily.    . hydrochlorothiazide (MICROZIDE) 12.5 MG capsule Take 12.5 mg by mouth daily.    Marland Kitchen loperamide (IMODIUM) 2 MG capsule Take 2 mg by mouth daily as needed. For diarrhea    . losartan-hydrochlorothiazide (HYZAAR) 100-25 MG per tablet Take 1 tablet by mouth daily.    . Meth-Hyo-M Bl-Na Phos-Ph Sal (UR N-C) 81.6 MG TABS Take by mouth.    Marland Kitchen OVER THE COUNTER MEDICATION Inject 1 application as directed every 30 (thirty) days. cimzia    . potassium chloride SA (K-DUR) 20 MEQ tablet Take 20 mEq by mouth 2 (two) times daily.    . pravastatin (PRAVACHOL) 40 MG tablet Take 60 mg by mouth daily.    . pregabalin (LYRICA) 75  MG capsule Take 75 mg by mouth 2 (two) times daily.    . traMADol (ULTRAM-ER) 100 MG 24 hr tablet Take 100 mg by mouth daily as needed. For pain     No current facility-administered medications for this visit.     Has been on various anti-TNF meds  ___________________________________________________________________ Objective   Exam:   No exam-virtual visit  She is well-appearing, pleasant and conversational.  Ambulatory, then sitting in a chair comfortably.  Data:  Labs as noted above  Assessment: Encounter Diagnoses  Name Primary?  . Iron deficiency anemia, unspecified iron deficiency anemia type Yes  . Abdominal bloating   . Chronic diarrhea   . Long term current use of immunosuppressive drug     Multifactorial anemia with component of iron deficiency.  Hematology has requested endoscopic evaluation to rule out source of chronic occult GI blood loss contributing to this IDA.  I discussed the nature of this with the patient, and she is agreeable.  Upper endoscopy and colonoscopy recommended.  Risks and benefits reviewed.  The benefits and risks of the planned procedure were described in detail  with the patient or (when appropriate) their health care proxy.  Risks were outlined as including, but not limited to, bleeding, infection, perforation, adverse medication reaction leading to cardiac or pulmonary decompensation.  The limitation of incomplete mucosal visualization was also discussed.  No guarantees or warranties were given.  He is aware increase infection risk in the healthcare setting due to COVID and immune suppressive therapy.  Plan:  My office will contact her to schedule EGD and colonoscopy with me in our endoscopy center. She will follow-up with hematology to monitor blood counts and dose iron.  Thank you for the courtesy of this consult.  Please call me with any questions or concerns.  Nelida Meuse III  CC: Referring provider noted above

## 2019-02-13 NOTE — Telephone Encounter (Signed)
Scheduled for 03-10-2019

## 2019-02-13 NOTE — Telephone Encounter (Signed)
Left message to call.

## 2019-02-13 NOTE — Telephone Encounter (Signed)
-----   Message from Doran Stabler, MD sent at 02/13/2019  9:36 AM EDT ----- Please schedule patient for upper endoscopy and colonoscopy with me in the [facility] for iron deficiency anemia.  Preparation should be either Suprep or Plenvu with the following additions:  Dulcolax 5 mg tablets, 2 tablets the morning of prep day, another 2 tablets about 3 PM on prep day.  Start solution 5- 6 PM per usual.  Metoclopramide 5 mg tablet, dispense 2 with no refills.  Take 1 tablet about 1 hour before each prep dose.

## 2019-02-13 NOTE — Addendum Note (Signed)
Addended by: Elias Else on: 02/13/2019 01:46 PM   Modules accepted: Orders

## 2019-03-10 ENCOUNTER — Encounter: Payer: BLUE CROSS/BLUE SHIELD | Admitting: Gastroenterology

## 2019-03-17 ENCOUNTER — Telehealth: Payer: Self-pay | Admitting: Gastroenterology

## 2019-03-18 ENCOUNTER — Inpatient Hospital Stay: Payer: BC Managed Care – PPO | Attending: Internal Medicine

## 2019-03-18 ENCOUNTER — Inpatient Hospital Stay (HOSPITAL_BASED_OUTPATIENT_CLINIC_OR_DEPARTMENT_OTHER): Payer: BC Managed Care – PPO | Admitting: Internal Medicine

## 2019-03-18 ENCOUNTER — Other Ambulatory Visit: Payer: Self-pay

## 2019-03-18 VITALS — BP 130/99 | HR 73 | Temp 98.8°F | Resp 18 | Ht 64.0 in | Wt 201.0 lb

## 2019-03-18 DIAGNOSIS — Z79899 Other long term (current) drug therapy: Secondary | ICD-10-CM | POA: Diagnosis not present

## 2019-03-18 DIAGNOSIS — Z7982 Long term (current) use of aspirin: Secondary | ICD-10-CM

## 2019-03-18 DIAGNOSIS — M1990 Unspecified osteoarthritis, unspecified site: Secondary | ICD-10-CM | POA: Insufficient documentation

## 2019-03-18 DIAGNOSIS — I2510 Atherosclerotic heart disease of native coronary artery without angina pectoris: Secondary | ICD-10-CM | POA: Diagnosis not present

## 2019-03-18 DIAGNOSIS — Z800 Family history of malignant neoplasm of digestive organs: Secondary | ICD-10-CM | POA: Diagnosis not present

## 2019-03-18 DIAGNOSIS — D72829 Elevated white blood cell count, unspecified: Secondary | ICD-10-CM | POA: Diagnosis not present

## 2019-03-18 DIAGNOSIS — Z8673 Personal history of transient ischemic attack (TIA), and cerebral infarction without residual deficits: Secondary | ICD-10-CM

## 2019-03-18 DIAGNOSIS — D508 Other iron deficiency anemias: Secondary | ICD-10-CM

## 2019-03-18 DIAGNOSIS — I10 Essential (primary) hypertension: Secondary | ICD-10-CM

## 2019-03-18 DIAGNOSIS — R7989 Other specified abnormal findings of blood chemistry: Secondary | ICD-10-CM | POA: Insufficient documentation

## 2019-03-18 DIAGNOSIS — D509 Iron deficiency anemia, unspecified: Secondary | ICD-10-CM

## 2019-03-18 LAB — CMP (CANCER CENTER ONLY)
ALT: 21 U/L (ref 0–44)
AST: 15 U/L (ref 15–41)
Albumin: 3.6 g/dL (ref 3.5–5.0)
Alkaline Phosphatase: 103 U/L (ref 38–126)
Anion gap: 11 (ref 5–15)
BUN: 13 mg/dL (ref 6–20)
CO2: 26 mmol/L (ref 22–32)
Calcium: 8.5 mg/dL — ABNORMAL LOW (ref 8.9–10.3)
Chloride: 103 mmol/L (ref 98–111)
Creatinine: 0.64 mg/dL (ref 0.44–1.00)
GFR, Est AFR Am: >60 mL/min (ref >60)
GFR, Estimated: >60 mL/min (ref >60)
Glucose, Bld: 104 mg/dL — ABNORMAL HIGH (ref 70–99)
Potassium: 4.3 mmol/L (ref 3.5–5.1)
Sodium: 140 mmol/L (ref 135–145)
Total Bilirubin: 0.4 mg/dL (ref 0.3–1.2)
Total Protein: 6.7 g/dL (ref 6.5–8.1)

## 2019-03-18 LAB — CBC WITH DIFFERENTIAL (CANCER CENTER ONLY)
Abs Immature Granulocytes: 0.03 10*3/uL (ref 0.00–0.07)
Basophils Absolute: 0.1 10*3/uL (ref 0.0–0.1)
Basophils Relative: 1 %
Eosinophils Absolute: 0.3 10*3/uL (ref 0.0–0.5)
Eosinophils Relative: 4 %
HCT: 41.1 % (ref 36.0–46.0)
Hemoglobin: 13.5 g/dL (ref 12.0–15.0)
Immature Granulocytes: 0 %
Lymphocytes Relative: 20 %
Lymphs Abs: 1.8 10*3/uL (ref 0.7–4.0)
MCH: 29.7 pg (ref 26.0–34.0)
MCHC: 32.8 g/dL (ref 30.0–36.0)
MCV: 90.3 fL (ref 80.0–100.0)
Monocytes Absolute: 0.6 10*3/uL (ref 0.1–1.0)
Monocytes Relative: 7 %
Neutro Abs: 6.1 10*3/uL (ref 1.7–7.7)
Neutrophils Relative %: 68 %
Platelet Count: 276 10*3/uL (ref 150–400)
RBC: 4.55 MIL/uL (ref 3.87–5.11)
RDW: 19.8 % — ABNORMAL HIGH (ref 11.5–15.5)
WBC Count: 8.9 10*3/uL (ref 4.0–10.5)
nRBC: 0 % (ref 0.0–0.2)

## 2019-03-18 LAB — FERRITIN: Ferritin: 97 ng/mL (ref 11–307)

## 2019-03-18 LAB — LACTATE DEHYDROGENASE: LDH: 193 U/L — ABNORMAL HIGH (ref 98–192)

## 2019-03-18 NOTE — Progress Notes (Signed)
Diagnosis Other iron deficiency anemia - Plan: CBC with Differential ([place] Only), CMP ([place] only), Lactate dehydrogenase (LDH), Ferritin  Staging Cancer Staging No matching staging information was found for the patient.  Assessment and Plan:  1.  Iron deficiency anemia ( IDA).  48 year old female referred for evaluation due to anemia and leucocytosis.  Pt is being treated with Orencia.  She had labs done 11/03/2018 that showed WBC 12.6 HB 9.8 plts 462,000.  She had a normal differential.  Chemistries showed K+ 3.7 Cr 0.6 and normal LFTs.  Pt denies any fevers, chills, night sweats and has noted no adenopathy.  She had taken prednisone in the past.  She reports a family history of colon cancer.   Labs done 01/15/2019 showed WBC 7 HB 10.2 plts 547,000 MCV 80 Sed rate 26.  Chemistries WNL with K+ 3.8 Cr 0.72 and normal LFTS.  Ferritin is decreased at 16.  Pt has intolerance to oral iron and was treated with Feraheme 510 mg IV D1 and D8 on 01/23/2019 and 01/30/2019 .  Labs done 03/18/2019 reviewed and showed WBC 8.9 HB 13.5 plts 276,000.   Chemistries showed K+ 4.3 Cr 0.64 and normal LFTs.  Ferritin is improved at 97.  HB improved at 13.  Pt will have follow-up in 07/2019 with repeat labs.    She was previously recommended for GI evaluation due to IDA and is encouraged to follow-up.    2.  Leucocytosis/Thrombocytosis.  WBC 8.9 Plts 276,000.  Pt has negative BCR/ABL.  Suspect reactive thrombocytosis due to IDA.    3.  Arthritis.  Pt on Orencia.  Follow-up with PCP and Rheumatology as directed.  She reports she is scheduled for hip replacement in 05/2019.    4.  Hypertension.  BP was 130/99.  Follow-up with PCP.    5.  Family history of colon cancer.  Pt reports this occurred in grandparents.  Pt was referred to GI for evaluation due to IDA.    6.  Health maintenance.  Pt was referred to GI for evaluation.  Mammogram screening per PCP.    25 minutes spent with more than 50% spent in  counseling and coordination of care.     Interval History:  Historical data obtained from note dated 01/15/2019.   48 year old female referred for evaluation due to anemia and leucocytosis.  Pt is being treated with Orencia.  She had labs done 11/03/2018 that showed WBC 12,6 HB 9.8 plts 462,000.  She had a normal differential.  Chemistries showed K+ 3.7 Cr 0.6 and normal LFTs.  Pt denies any fevers, chills, night sweats and has noted no adenopathy.  She had taken prednisone in the past.  She reports a family history of colon cancer.    Current Status:  Pt is seen today for follow-up.  She is here to go over labs.  She reports she is planned for hip replacement in 05/2019.     Oncology History   No history exists.     Problem List Patient Active Problem List   Diagnosis Date Noted  . Iron deficiency anemia [D50.9] 01/15/2019  . Acute ischemic stroke ([place]) [I63.9] 01/19/2012  . Hypertension [I10] 01/19/2012  . Anxiety [F41.9] 01/19/2012  . Chronic knee pain [M25.569, G89.29] 01/19/2012  . RESTLESS LEGS SYNDROME [G25.81] 09/05/2010  . HYPERSOMNIA [G47.10] 06/04/2010    Past Medical History Past Medical History:  Diagnosis Date  . Anxiety   . Coronary artery disease   . Hypertension   .  Iron deficiency anemia 01/15/2019  . Psoriatic arthritis ([place])   . Stroke [facility])     Past Surgical History Past Surgical History:  Procedure Laterality Date  . CARPAL TUNNEL RELEASE     B/L hand  . CHOLECYSTECTOMY    . HERNIA REPAIR    . KNEE ARTHROSCOPY Bilateral 08/2014   [facility]  . TEE WITHOUT CARDIOVERSION  01/22/2012   Procedure: TRANSESOPHAGEAL ECHOCARDIOGRAM (TEE);  Surgeon: Lelon Perla, MD;  Location: [facility] ENDOSCOPY;  Service: Cardiovascular;  Laterality: N/A;    Family History Family History  Problem Relation Age of Onset  . Hypertension Mother   . Hypertension Father      Social History  reports that she has never smoked. She has never used smokeless tobacco. She  reports current alcohol use. She reports that she does not use drugs.  Medications  Current Outpatient Medications:  .  Abatacept (ORENCIA IV), Inject into the vein., Disp: , Rfl:  .  amoxicillin-clavulanate (AUGMENTIN) 875-125 MG tablet, Take 1 tablet by mouth 2 (two) times daily., Disp: , Rfl:  .  aspirin 325 MG tablet, Take 325 mg by mouth daily., Disp: , Rfl:  .  atorvastatin (LIPITOR) 20 MG tablet, Take 40 mg by mouth daily. , Disp: , Rfl:  .  Buprenorphine HCl (BELBUCA BU), Place 300 mcg inside cheek., Disp: , Rfl:  .  Certolizumab Pegol (CIMZIA PREFILLED [place]), Inject 1 Applicatorful into the skin every 30 (thirty) days., Disp: , Rfl:  .  doxycycline (VIBRAMYCIN) 100 MG capsule, Take 100 mg by mouth 2 (two) times daily., Disp: , Rfl:  .  escitalopram (LEXAPRO) 20 MG tablet, Take 20 mg by mouth daily., Disp: , Rfl:  .  hydrochlorothiazide (MICROZIDE) 12.5 MG capsule, Take 12.5 mg by mouth daily., Disp: , Rfl:  .  loperamide (IMODIUM) 2 MG capsule, Take 2 mg by mouth daily as needed. For diarrhea, Disp: , Rfl:  .  losartan-hydrochlorothiazide (HYZAAR) 100-25 MG per tablet, Take 1 tablet by mouth daily., Disp: , Rfl:  .  OVER THE COUNTER MEDICATION, Inject 1 application as directed every 30 (thirty) days. cimzia, Disp: , Rfl:  .  potassium chloride SA (K-DUR) 20 MEQ tablet, Take 20 mEq by mouth 2 (two) times daily., Disp: , Rfl:  .  pregabalin (LYRICA) 75 MG capsule, Take 75 mg by mouth 2 (two) times daily., Disp: , Rfl:   Allergies Nsaids  Review of Systems Review of Systems - Oncology ROS negative other than joint pain   Physical Exam  Vitals Wt Readings from Last 3 Encounters:  03/18/19 201 lb (91.2 kg)  02/13/19 190 lb (86.2 kg)  02/11/19 190 lb (86.2 kg)   Temp Readings from Last 3 Encounters:  03/18/19 98.8 F (37.1 C) (Temporal)  01/30/19 98.6 F (37 C) (Oral)  01/23/19 98.8 F (37.1 C) (Oral)   BP Readings from Last 3 Encounters:  03/18/19 (!) 130/99  01/30/19  100/62  01/23/19 117/71   Pulse Readings from Last 3 Encounters:  03/18/19 73  01/30/19 71  01/23/19 76   Constitutional: Well-developed, well-nourished, and in no distress.   HENT: Head: Normocephalic and atraumatic.  Mouth/Throat: No oropharyngeal exudate. Mucosa moist. Eyes: Pupils are equal, round, and reactive to light. Conjunctivae are normal. No scleral icterus.  Neck: Normal range of motion. Neck supple. No JVD present.  Cardiovascular: Normal rate, regular rhythm and normal heart sounds.  Exam reveals no gallop and no friction rub.   No murmur heard. Pulmonary/Chest: Effort normal  and breath sounds normal. No respiratory distress. No wheezes.No rales.  Abdominal: Soft. Bowel sounds are normal. No distension. There is no tenderness. There is no guarding.  Musculoskeletal: Joint tenderness. Walks with limp.  Lymphadenopathy: No cervical, axillary or supraclavicular adenopathy.  Neurological: Alert and oriented to person, place, and time. No cranial nerve deficit.  Skin: Skin is warm and dry. No rash noted. No erythema. No pallor.  Psychiatric: Affect and judgment normal.   Labs Appointment on 03/18/2019  Component Date Value Ref Range Status  . WBC Count 03/18/2019 8.9  4.0 - 10.5 K/uL Final  . RBC 03/18/2019 4.55  3.87 - 5.11 MIL/uL Final  . Hemoglobin 03/18/2019 13.5  12.0 - 15.0 g/dL Final  . HCT 03/18/2019 41.1  36.0 - 46.0 % Final  . MCV 03/18/2019 90.3  80.0 - 100.0 fL Final  . MCH 03/18/2019 29.7  26.0 - 34.0 pg Final  . MCHC 03/18/2019 32.8  30.0 - 36.0 g/dL Final  . RDW 03/18/2019 19.8* 11.5 - 15.5 % Final  . Platelet Count 03/18/2019 276  150 - 400 K/uL Final  . nRBC 03/18/2019 0.0  0.0 - 0.2 % Final  . Neutrophils Relative % 03/18/2019 68  % Final  . Neutro Abs 03/18/2019 6.1  1.7 - 7.7 K/uL Final  . Lymphocytes Relative 03/18/2019 20  % Final  . Lymphs Abs 03/18/2019 1.8  0.7 - 4.0 K/uL Final  . Monocytes Relative 03/18/2019 7  % Final  . Monocytes Absolute  03/18/2019 0.6  0.1 - 1.0 K/uL Final  . Eosinophils Relative 03/18/2019 4  % Final  . Eosinophils Absolute 03/18/2019 0.3  0.0 - 0.5 K/uL Final  . Basophils Relative 03/18/2019 1  % Final  . Basophils Absolute 03/18/2019 0.1  0.0 - 0.1 K/uL Final  . Immature Granulocytes 03/18/2019 0  % Final  . Abs Immature Granulocytes 03/18/2019 0.03  0.00 - 0.07 K/uL Final   Performed at [facility], [place] [address] [place], [place] [address]  . Sodium 03/18/2019 140  135 - 145 mmol/L Final  . Potassium 03/18/2019 4.3  3.5 - 5.1 mmol/L Final  . Chloride 03/18/2019 103  98 - 111 mmol/L Final  . CO2 03/18/2019 26  22 - 32 mmol/L Final  . Glucose, Bld 03/18/2019 104* 70 - 99 mg/dL Final  . BUN 03/18/2019 13  6 - 20 mg/dL Final  . Creatinine 03/18/2019 0.64  0.44 - 1.00 mg/dL Final  . Calcium 03/18/2019 8.5* 8.9 - 10.3 mg/dL Final  . Total Protein 03/18/2019 6.7  6.5 - 8.1 g/dL Final  . Albumin 03/18/2019 3.6  3.5 - 5.0 g/dL Final  . AST 03/18/2019 15  15 - 41 U/L Final  . ALT 03/18/2019 21  0 - 44 U/L Final  . Alkaline Phosphatase 03/18/2019 103  38 - 126 U/L Final  . Total Bilirubin 03/18/2019 0.4  0.3 - 1.2 mg/dL Final  . GFR, Est Non Af Am 03/18/2019 >60  >60 mL/min Final  . GFR, Est AFR Am 03/18/2019 >60  >60 mL/min Final  . Anion gap 03/18/2019 11  5 - 15 Final   Performed at [facility], [place] [address], [place], [place] [address]  . LDH 03/18/2019 193* 98 - 192 U/L Final   Performed at [facility], [place] [address] [place], [place] [address]  . Ferritin 03/18/2019 97  11 - 307 ng/mL Final   Performed at [facility], [address] [place], [place] [address]     Pathology Orders Placed This Encounter  Procedures  . CBC with Differential (Cancer Center Only)    Standing Status:   Future    Standing Expiration Date:   03/17/2020  . CMP ([place] only)    Standing Status:   Future     Standing Expiration Date:   03/17/2020  . Lactate dehydrogenase (LDH)    Standing Status:   Future    Standing Expiration Date:   03/17/2020  . Ferritin    Standing Status:   Future    Standing Expiration Date:   03/17/2020       Zoila Shutter MD

## 2019-03-18 NOTE — Telephone Encounter (Signed)
Understood, thanks.  Please communicate this to the [facility] staff so they are aware of the schedule change and need to reschedule if new date has not already been set.

## 2019-03-19 ENCOUNTER — Encounter: Payer: BLUE CROSS/BLUE SHIELD | Admitting: Gastroenterology

## 2019-04-15 ENCOUNTER — Encounter: Payer: BC Managed Care – PPO | Admitting: Gastroenterology

## 2019-08-06 ENCOUNTER — Telehealth: Payer: Self-pay | Admitting: Hematology and Oncology

## 2019-08-06 NOTE — Telephone Encounter (Signed)
[facility] transfer to [place]. Confirmed 12/11 lab/fu with patient.

## 2019-08-10 ENCOUNTER — Inpatient Hospital Stay (HOSPITAL_BASED_OUTPATIENT_CLINIC_OR_DEPARTMENT_OTHER)
Admission: EM | Admit: 2019-08-10 | Discharge: 2019-08-17 | DRG: 177 | Disposition: A | Discharge status: Home / Self Care | Payer: BC Managed Care – PPO | Attending: Internal Medicine | Admitting: Internal Medicine

## 2019-08-10 ENCOUNTER — Emergency Department (HOSPITAL_BASED_OUTPATIENT_CLINIC_OR_DEPARTMENT_OTHER): Payer: BC Managed Care – PPO

## 2019-08-10 ENCOUNTER — Other Ambulatory Visit: Payer: Self-pay

## 2019-08-10 ENCOUNTER — Encounter (HOSPITAL_BASED_OUTPATIENT_CLINIC_OR_DEPARTMENT_OTHER): Payer: Self-pay | Admitting: Emergency Medicine

## 2019-08-10 DIAGNOSIS — I2510 Atherosclerotic heart disease of native coronary artery without angina pectoris: Secondary | ICD-10-CM | POA: Diagnosis present

## 2019-08-10 DIAGNOSIS — R0600 Dyspnea, unspecified: Secondary | ICD-10-CM

## 2019-08-10 DIAGNOSIS — L4050 Arthropathic psoriasis, unspecified: Secondary | ICD-10-CM | POA: Diagnosis present

## 2019-08-10 DIAGNOSIS — J189 Pneumonia, unspecified organism: Principal | ICD-10-CM | POA: Diagnosis present

## 2019-08-10 DIAGNOSIS — Z20828 Contact with and (suspected) exposure to other viral communicable diseases: Secondary | ICD-10-CM

## 2019-08-10 DIAGNOSIS — M25569 Pain in unspecified knee: Secondary | ICD-10-CM | POA: Diagnosis present

## 2019-08-10 DIAGNOSIS — R0902 Hypoxemia: Secondary | ICD-10-CM | POA: Diagnosis not present

## 2019-08-10 DIAGNOSIS — I10 Essential (primary) hypertension: Secondary | ICD-10-CM | POA: Diagnosis present

## 2019-08-10 DIAGNOSIS — D509 Iron deficiency anemia, unspecified: Secondary | ICD-10-CM | POA: Diagnosis present

## 2019-08-10 DIAGNOSIS — T8452XA Infection and inflammatory reaction due to internal left hip prosthesis, initial encounter: Secondary | ICD-10-CM | POA: Diagnosis present

## 2019-08-10 DIAGNOSIS — I959 Hypotension, unspecified: Secondary | ICD-10-CM | POA: Diagnosis present

## 2019-08-10 DIAGNOSIS — Z888 Allergy status to other drugs, medicaments and biological substances status: Secondary | ICD-10-CM

## 2019-08-10 DIAGNOSIS — K219 Gastro-esophageal reflux disease without esophagitis: Secondary | ICD-10-CM | POA: Diagnosis present

## 2019-08-10 DIAGNOSIS — Z79899 Other long term (current) drug therapy: Secondary | ICD-10-CM

## 2019-08-10 DIAGNOSIS — G8929 Other chronic pain: Secondary | ICD-10-CM | POA: Diagnosis present

## 2019-08-10 DIAGNOSIS — U071 COVID-19: Secondary | ICD-10-CM | POA: Diagnosis not present

## 2019-08-10 DIAGNOSIS — E785 Hyperlipidemia, unspecified: Secondary | ICD-10-CM | POA: Diagnosis present

## 2019-08-10 DIAGNOSIS — F419 Anxiety disorder, unspecified: Secondary | ICD-10-CM | POA: Diagnosis present

## 2019-08-10 DIAGNOSIS — Z7982 Long term (current) use of aspirin: Secondary | ICD-10-CM

## 2019-08-10 DIAGNOSIS — G2581 Restless legs syndrome: Secondary | ICD-10-CM | POA: Diagnosis present

## 2019-08-10 DIAGNOSIS — Z20822 Contact with and (suspected) exposure to covid-19: Secondary | ICD-10-CM

## 2019-08-10 DIAGNOSIS — F329 Major depressive disorder, single episode, unspecified: Secondary | ICD-10-CM | POA: Diagnosis present

## 2019-08-10 DIAGNOSIS — J1289 Other viral pneumonia: Secondary | ICD-10-CM | POA: Diagnosis present

## 2019-08-10 DIAGNOSIS — J9601 Acute respiratory failure with hypoxia: Secondary | ICD-10-CM | POA: Diagnosis present

## 2019-08-10 DIAGNOSIS — Z8249 Family history of ischemic heart disease and other diseases of the circulatory system: Secondary | ICD-10-CM

## 2019-08-10 DIAGNOSIS — Z87891 Personal history of nicotine dependence: Secondary | ICD-10-CM

## 2019-08-10 DIAGNOSIS — Z8673 Personal history of transient ischemic attack (TIA), and cerebral infarction without residual deficits: Secondary | ICD-10-CM

## 2019-08-10 LAB — CBC WITH DIFFERENTIAL/PLATELET
Abs Immature Granulocytes: 0.1 10*3/uL — ABNORMAL HIGH (ref 0.00–0.07)
Basophils Absolute: 0.1 10*3/uL (ref 0.0–0.1)
Basophils Relative: 1 %
Eosinophils Absolute: 0.6 10*3/uL — ABNORMAL HIGH (ref 0.0–0.5)
Eosinophils Relative: 3 %
HCT: 31.6 % — ABNORMAL LOW (ref 36.0–46.0)
Hemoglobin: 9.8 g/dL — ABNORMAL LOW (ref 12.0–15.0)
Immature Granulocytes: 1 %
Lymphocytes Relative: 10 %
Lymphs Abs: 1.8 10*3/uL (ref 0.7–4.0)
MCH: 24.7 pg — ABNORMAL LOW (ref 26.0–34.0)
MCHC: 31 g/dL (ref 30.0–36.0)
MCV: 79.8 fL — ABNORMAL LOW (ref 80.0–100.0)
Monocytes Absolute: 0.4 10*3/uL (ref 0.1–1.0)
Monocytes Relative: 2 %
Neutro Abs: 14.8 10*3/uL — ABNORMAL HIGH (ref 1.7–7.7)
Neutrophils Relative %: 83 %
Platelets: 345 10*3/uL (ref 150–400)
RBC: 3.96 MIL/uL (ref 3.87–5.11)
RDW: 16.2 % — ABNORMAL HIGH (ref 11.5–15.5)
WBC: 17.7 10*3/uL — ABNORMAL HIGH (ref 4.0–10.5)
nRBC: 0 % (ref 0.0–0.2)

## 2019-08-10 LAB — COMPREHENSIVE METABOLIC PANEL
ALT: 13 U/L (ref 0–44)
AST: 29 U/L (ref 15–41)
Albumin: 3.2 g/dL — ABNORMAL LOW (ref 3.5–5.0)
Alkaline Phosphatase: 108 U/L (ref 38–126)
Anion gap: 12 (ref 5–15)
BUN: 14 mg/dL (ref 6–20)
CO2: 31 mmol/L (ref 22–32)
Calcium: 8.6 mg/dL — ABNORMAL LOW (ref 8.9–10.3)
Chloride: 92 mmol/L — ABNORMAL LOW (ref 98–111)
Creatinine, Ser: 0.54 mg/dL (ref 0.44–1.00)
GFR calc Af Amer: >60 mL/min (ref >60)
GFR calc non Af Amer: >60 mL/min (ref >60)
Glucose, Bld: 111 mg/dL — ABNORMAL HIGH (ref 70–99)
Potassium: 3.2 mmol/L — ABNORMAL LOW (ref 3.5–5.1)
Sodium: 135 mmol/L (ref 135–145)
Total Bilirubin: 0.3 mg/dL (ref 0.3–1.2)
Total Protein: 6.5 g/dL (ref 6.5–8.1)

## 2019-08-10 LAB — PREGNANCY, URINE: Preg Test, Ur: NEGATIVE

## 2019-08-10 LAB — SARS CORONAVIRUS 2 AG (30 MIN TAT): SARS Coronavirus 2 Ag: NEGATIVE

## 2019-08-10 LAB — PROCALCITONIN: Procalcitonin: 0.34 ng/mL

## 2019-08-10 LAB — LACTIC ACID, PLASMA
Lactic Acid, Venous: 1.3 mmol/L (ref 0.5–1.9)
Lactic Acid, Venous: 0.8 mmol/L (ref 0.5–1.9)

## 2019-08-10 LAB — FERRITIN: Ferritin: 86 ng/mL (ref 11–307)

## 2019-08-10 LAB — LACTATE DEHYDROGENASE: LDH: 400 U/L — ABNORMAL HIGH (ref 98–192)

## 2019-08-10 LAB — TRIGLYCERIDES: Triglycerides: 112 mg/dL (ref <150)

## 2019-08-10 LAB — FIBRINOGEN: Fibrinogen: 796 mg/dL — ABNORMAL HIGH (ref 210–475)

## 2019-08-10 LAB — C-REACTIVE PROTEIN: CRP: 21.1 mg/dL — ABNORMAL HIGH (ref <1.0)

## 2019-08-10 LAB — D-DIMER, QUANTITATIVE: D-Dimer, Quant: 3.01 ug/mL-FEU — ABNORMAL HIGH (ref 0.00–0.50)

## 2019-08-10 LAB — HCG, SERUM, QUALITATIVE: Preg, Serum: NEGATIVE

## 2019-08-10 MED ORDER — SODIUM CHLORIDE 0.9 % IV BOLUS
500.0000 mL | Freq: Once | INTRAVENOUS | Status: AC
  Administered 2019-08-10: 500 mL via INTRAVENOUS

## 2019-08-10 MED ORDER — DEXAMETHASONE SODIUM PHOSPHATE 10 MG/ML IJ SOLN
6.0000 mg | Freq: Once | INTRAMUSCULAR | Status: AC
  Administered 2019-08-10: 6 mg via INTRAVENOUS
  Filled 2019-08-10: qty 1

## 2019-08-10 MED ORDER — LORAZEPAM 2 MG/ML IJ SOLN
1.0000 mg | Freq: Once | INTRAMUSCULAR | Status: AC
  Administered 2019-08-10: 1 mg via INTRAVENOUS
  Filled 2019-08-10: qty 1

## 2019-08-10 MED ORDER — ONDANSETRON HCL 4 MG/2ML IJ SOLN
4.0000 mg | Freq: Once | INTRAMUSCULAR | Status: AC
  Administered 2019-08-10: 4 mg via INTRAVENOUS
  Filled 2019-08-10: qty 2

## 2019-08-10 MED ORDER — SODIUM CHLORIDE 0.9 % IV SOLN
500.0000 mg | Freq: Once | INTRAVENOUS | Status: AC
  Administered 2019-08-10: 500 mg via INTRAVENOUS
  Filled 2019-08-10: qty 500

## 2019-08-10 MED ORDER — SODIUM CHLORIDE 0.9 % IV SOLN
1.0000 g | Freq: Once | INTRAVENOUS | Status: AC
  Administered 2019-08-10: 1 g via INTRAVENOUS
  Filled 2019-08-10: qty 10

## 2019-08-10 NOTE — ED Notes (Signed)
Pt assisted to bedside commode- pt had dyspnea with exertion and SpO2 dropped to 79%. Pt O2 increased to 6L and RT called to bedside. Pt assisted back to bed. SpO2 90.

## 2019-08-10 NOTE — Plan of Care (Signed)
48 yo F with multifocal PNA, new O2 requirement.  Rapid COVID is neg.  Will put in as PUI to Tele bed.  Got ABx in ED, WBC 17k, will also get decadron in ED.

## 2019-08-10 NOTE — ED Triage Notes (Signed)
Per EMS:  Pt sent from urgent care with sob x 2 days.  O2 at 3L/[place], 94% pulse ox on oxygen.

## 2019-08-10 NOTE — ED Provider Notes (Signed)
[facility] EMERGENCY DEPARTMENT Provider Note   CSN: [id number] Arrival date & time: 08/10/19  1730     History   Chief Complaint Chief Complaint  Patient presents with  . Shortness of Breath    HPI [name] [name] is a 48 y.o. female.  She is complaining of cough shortness of breath headache body aches fever to 103 that began 2 days ago.  She went to urgent care where reportedly her saturations were in the 60s and they placed her on oxygen and transported her by EMS to here.  She said she has had pneumonia twice this year with similar symptoms and tested negative for Covid 2 months ago.  No sick contacts or recent travel.  Does not normally require oxygen.     The history is provided by the patient.  Shortness of Breath Severity:  Moderate Onset quality:  Gradual Timing:  Constant Progression:  Worsening Chronicity:  Recurrent Relieved by:  Nothing Worsened by:  Activity Ineffective treatments:  None tried Associated symptoms: cough, fever and headaches   Associated symptoms: no abdominal pain, no chest pain, no hemoptysis, no rash, no sore throat, no sputum production, no syncope and no vomiting   Risk factors: no hx of cancer     Past Medical History:  Diagnosis Date  . Anxiety   . Coronary artery disease   . Hypertension   . Iron deficiency anemia 01/15/2019  . Psoriatic arthritis ([place])   . Stroke [facility])     Patient Active Problem List   Diagnosis Date Noted  . Iron deficiency anemia 01/15/2019  . Acute ischemic stroke ([place]) 01/19/2012  . Hypertension 01/19/2012  . Anxiety 01/19/2012  . Chronic knee pain 01/19/2012  . RESTLESS LEGS SYNDROME 09/05/2010  . HYPERSOMNIA 06/04/2010    Past Surgical History:  Procedure Laterality Date  . CARPAL TUNNEL RELEASE     B/L hand  . CHOLECYSTECTOMY    . HERNIA REPAIR    . KNEE ARTHROSCOPY Bilateral 08/2014   [facility] Len [facility]  . TEE WITHOUT CARDIOVERSION  01/22/2012   Procedure: TRANSESOPHAGEAL  ECHOCARDIOGRAM (TEE);  Surgeon: Lelon Perla, MD;  Location: [facility] ENDOSCOPY;  Service: Cardiovascular;  Laterality: N/A;     OB History   No obstetric history on file.      Home Medications    Prior to Admission medications   Medication Sig Start Date End Date Taking? Authorizing Provider  Abatacept (ORENCIA IV) Inject into the vein.    [provider]  amoxicillin-clavulanate (AUGMENTIN) 875-125 MG tablet Take 1 tablet by mouth 2 (two) times daily.    [provider]  aspirin 325 MG tablet Take 325 mg by mouth daily.    [provider]  atorvastatin (LIPITOR) 20 MG tablet Take 40 mg by mouth daily.     [provider]  Buprenorphine HCl (BELBUCA BU) Place 300 mcg inside cheek.    [provider]  Certolizumab Pegol (CIMZIA PREFILLED [place]) Inject 1 Applicatorful into the skin every 30 (thirty) days.    [provider]  doxycycline (VIBRAMYCIN) 100 MG capsule Take 100 mg by mouth 2 (two) times daily.    [provider]  escitalopram (LEXAPRO) 20 MG tablet Take 20 mg by mouth daily.    [provider]  hydrochlorothiazide (MICROZIDE) 12.5 MG capsule Take 12.5 mg by mouth daily.    [provider]  loperamide (IMODIUM) 2 MG capsule Take 2 mg by mouth daily as needed. For diarrhea  [provider]  losartan-hydrochlorothiazide (HYZAAR) 100-25 MG per tablet Take 1 tablet by mouth daily.    [provider]  OVER THE COUNTER MEDICATION Inject 1 application as directed every 30 (thirty) days. cimzia    [provider]  potassium chloride SA (K-DUR) 20 MEQ tablet Take 20 mEq by mouth 2 (two) times daily.    [provider]  pregabalin (LYRICA) 75 MG capsule Take 75 mg by mouth 2 (two) times daily.    [provider]    Family History Family History  Problem Relation Age of Onset  . Hypertension Mother   . Hypertension Father     Social History Social History    Tobacco Use  . Smoking status: Never Smoker  . Smokeless tobacco: Never Used  . Tobacco comment: quit 10 years ago, smoked intermittently for few years. Less than 10 yrs total.  Substance Use Topics  . Alcohol use: Yes    Comment: occ- 1-2 drinks a week  . Drug use: No     Allergies   Nsaids and Benzoin   Review of Systems Review of Systems  Constitutional: Positive for chills, fatigue and fever.  HENT: Negative for sore throat.   Eyes: Negative for visual disturbance.  Respiratory: Positive for cough and shortness of breath. Negative for hemoptysis and sputum production.   Cardiovascular: Negative for chest pain and syncope.  Gastrointestinal: Negative for abdominal pain, diarrhea, nausea and vomiting.  Genitourinary: Negative for dysuria.  Musculoskeletal: Positive for myalgias.  Skin: Negative for rash.  Neurological: Positive for headaches.     Physical Exam Updated Vital Signs BP 98/65   Pulse 71   Temp 98.9 F (37.2 C) (Oral)   Resp 20   Ht 5\' 4"  (1.626 m)   Wt 83 kg   LMP 07/27/2019   SpO2 94%   BMI 31.41 kg/m   Physical Exam Vitals signs and nursing note reviewed.  Constitutional:      General: She is not in acute distress.    Appearance: She is well-developed.  HENT:     Head: Normocephalic and atraumatic.  Eyes:     Conjunctiva/sclera: Conjunctivae normal.  Neck:     Musculoskeletal: Neck supple.  Cardiovascular:     Rate and Rhythm: Normal rate and regular rhythm.     Heart sounds: No murmur.  Pulmonary:     Effort: Pulmonary effort is normal. No respiratory distress.     Breath sounds: Normal breath sounds.  Abdominal:     Palpations: Abdomen is soft.     Tenderness: There is no abdominal tenderness.  Musculoskeletal: Normal range of motion.     Right lower leg: She exhibits no tenderness.     Left lower leg: She exhibits no tenderness.  Skin:    General: Skin is warm and dry.     Capillary Refill: Capillary refill takes less than 2  seconds.  Neurological:     General: No focal deficit present.     Mental Status: She is alert.      ED Treatments / Results  Labs (all labs ordered are listed, but only abnormal results are displayed) Labs Reviewed  CBC WITH DIFFERENTIAL/PLATELET - Abnormal; Notable for the following components:      Result Value   WBC 17.7 (*)    Hemoglobin 9.8 (*)    HCT 31.6 (*)    MCV 79.8 (*)    MCH 24.7 (*)    RDW 16.2 (*)    Neutro Abs 14.8 (*)  Eosinophils Absolute 0.6 (*)    Abs Immature Granulocytes 0.10 (*)    All other components within normal limits  COMPREHENSIVE METABOLIC PANEL - Abnormal; Notable for the following components:   Potassium 3.2 (*)    Chloride 92 (*)    Glucose, Bld 111 (*)    Calcium 8.6 (*)    Albumin 3.2 (*)    All other components within normal limits  D-DIMER, QUANTITATIVE (NOT AT [facility]) - Abnormal; Notable for the following components:   D-Dimer, Quant 3.01 (*)    All other components within normal limits  LACTATE DEHYDROGENASE - Abnormal; Notable for the following components:   LDH 400 (*)    All other components within normal limits  FIBRINOGEN - Abnormal; Notable for the following components:   Fibrinogen 796 (*)    All other components within normal limits  C-REACTIVE PROTEIN - Abnormal; Notable for the following components:   CRP 21.1 (*)    All other components within normal limits  CULTURE, BLOOD (ROUTINE X 2)  CULTURE, BLOOD (ROUTINE X 2)  SARS CORONAVIRUS 2 AG (30 MIN TAT)  SARS CORONAVIRUS 2 (TAT 6-24 HRS)  RESPIRATORY PANEL BY PCR  MRSA PCR SCREENING  LACTIC ACID, PLASMA  LACTIC ACID, PLASMA  PROCALCITONIN  FERRITIN  TRIGLYCERIDES  PREGNANCY, URINE  HCG, SERUM, QUALITATIVE  STREP PNEUMONIAE URINARY ANTIGEN  LEGIONELLA PNEUMOPHILA SEROGP 1 UR AG  HIV ANTIBODY (ROUTINE TESTING W REFLEX)  CBC  CREATININE, SERUM    EKG EKG Interpretation  Date/Time:  Monday August 10 2019 17:43:53 EST Ventricular Rate:  99 PR Interval:     QRS Duration: 97 QT Interval:  365 QTC Calculation: 469 R Axis:   50 Text Interpretation: Sinus rhythm Borderline T wave abnormalities similar to prior 11/15 Confirmed by Aletta Edouard [phone]) on 08/10/2019 5:50:10 PM   Radiology Dg Chest Port 1 View  Result Date: 08/10/2019 CLINICAL DATA:  Shortness of breath EXAM: PORTABLE CHEST 1 VIEW COMPARISON:  08/03/2019 FINDINGS: Mild subpleural patchy opacities in the lungs bilaterally, worrisome for multifocal pneumonia. Relative sparing of the right upper lobe. No pleural effusions. No pneumothorax. The heart is normal in size. IMPRESSION: Multifocal pneumonia. Atypical/viral etiologies (including COVID) are possible. Electronically Signed   By: Julian Hy M.D.   On: 08/10/2019 18:45    Procedures .Critical Care Performed by: Hayden Rasmussen, MD Authorized by: Hayden Rasmussen, MD   Critical care provider statement:    Critical care time (minutes):  45   Critical care time was exclusive of:  Separately billable procedures and treating other patients   Critical care was necessary to treat or prevent imminent or life-threatening deterioration of the following conditions:  Respiratory failure   Critical care was time spent personally by me on the following activities:  Discussions with consultants, evaluation of patient's response to treatment, examination of patient, ordering and performing treatments and interventions, ordering and review of laboratory studies, ordering and review of radiographic studies, pulse oximetry, re-evaluation of patient's condition, obtaining history from patient or surrogate, review of old charts and development of treatment plan with patient or surrogate   I assumed direction of critical care for this patient from another provider in my specialty: no     (including critical care time)  Medications Ordered in ED Medications  azithromycin (ZITHROMAX) 500 mg in sodium chloride 0.9 % 250 mL IVPB (has no  administration in time range)  dexamethasone (DECADRON) injection 6 mg (has no administration in time range)  sodium chloride 0.9 %  bolus 500 mL (500 mLs Intravenous New Bag/Given 08/10/19 1814)  cefTRIAXone (ROCEPHIN) 1 g in sodium chloride 0.9 % 100 mL IVPB (1 g Intravenous New Bag/Given 08/10/19 1900)     Initial Impression / Assessment and Plan / ED Course  I have reviewed the triage vital signs and the nursing notes.  Pertinent labs & imaging results that were available during my care of the patient were reviewed by me and considered in my medical decision making (see chart for details).  Clinical Course as of Aug 10 1116  Mon Aug 09, 1520  9027 48 year old female with history of pneumonia here with cough fever shortness of breath.  Hypoxic at urgent care.  Chest x-ray interpreted by me as likely multifocal pneumonia.   [MB]  [id number] WBC(!): 17.7 [MB]  1828 Lactic Acid, Venous: 1.3 [MB]  1907 Patient's chest x-ray showing multifocal pneumonia.  We tried her on room air and she desatted to 85%.  I reviewed the results with the patient and she is agreeable to admission.  Hospitalist has been consulted.   [MB]  1941 Discussed with Dr. Alcario Drought from [facility] hospitalist.  He is excepting the patient for admission.  Does recommend go ahead with 6 mg of Decadron IV for possible Covid.   [MB]  2036 Patient had desats when she was trying to use the bedside commode and now is on 6 L high flow.   [MB]    Clinical Course User Index [MB] Hayden Rasmussen, MD   Ian Malkin was evaluated in Emergency Department on 08/10/2019 for the symptoms described in the history of present illness. She was evaluated in the context of the global COVID-19 pandemic, which necessitated consideration that the patient might be at risk for infection with the SARS-CoV-2 virus that causes COVID-19. Institutional protocols and algorithms that pertain to the evaluation of patients at risk for COVID-19 are in a  state of rapid change based on information released by regulatory bodies including the CDC and federal and state organizations. These policies and algorithms were followed during the patient's care in the ED.       Final Clinical Impressions(s) / ED Diagnoses   Final diagnoses:  Multifocal pneumonia  Hypoxia  Person under investigation for COVID-19    ED Discharge Orders    None       Hayden Rasmussen, MD 08/11/19 1118

## 2019-08-11 ENCOUNTER — Encounter (HOSPITAL_COMMUNITY): Payer: Self-pay | Admitting: Orthopedic Surgery

## 2019-08-11 ENCOUNTER — Inpatient Hospital Stay (HOSPITAL_COMMUNITY): Payer: BC Managed Care – PPO

## 2019-08-11 DIAGNOSIS — I10 Essential (primary) hypertension: Secondary | ICD-10-CM | POA: Diagnosis present

## 2019-08-11 DIAGNOSIS — I82409 Acute embolism and thrombosis of unspecified deep veins of unspecified lower extremity: Secondary | ICD-10-CM

## 2019-08-11 DIAGNOSIS — J1289 Other viral pneumonia: Secondary | ICD-10-CM | POA: Diagnosis present

## 2019-08-11 DIAGNOSIS — G2581 Restless legs syndrome: Secondary | ICD-10-CM | POA: Diagnosis present

## 2019-08-11 DIAGNOSIS — K219 Gastro-esophageal reflux disease without esophagitis: Secondary | ICD-10-CM | POA: Diagnosis present

## 2019-08-11 DIAGNOSIS — F329 Major depressive disorder, single episode, unspecified: Secondary | ICD-10-CM | POA: Diagnosis present

## 2019-08-11 DIAGNOSIS — F419 Anxiety disorder, unspecified: Secondary | ICD-10-CM | POA: Diagnosis present

## 2019-08-11 DIAGNOSIS — J9601 Acute respiratory failure with hypoxia: Secondary | ICD-10-CM | POA: Diagnosis present

## 2019-08-11 DIAGNOSIS — Z79899 Other long term (current) drug therapy: Secondary | ICD-10-CM | POA: Diagnosis not present

## 2019-08-11 DIAGNOSIS — I959 Hypotension, unspecified: Secondary | ICD-10-CM | POA: Diagnosis present

## 2019-08-11 DIAGNOSIS — G8929 Other chronic pain: Secondary | ICD-10-CM | POA: Diagnosis present

## 2019-08-11 DIAGNOSIS — Z888 Allergy status to other drugs, medicaments and biological substances status: Secondary | ICD-10-CM | POA: Diagnosis not present

## 2019-08-11 DIAGNOSIS — R7989 Other specified abnormal findings of blood chemistry: Secondary | ICD-10-CM | POA: Diagnosis not present

## 2019-08-11 DIAGNOSIS — R0902 Hypoxemia: Secondary | ICD-10-CM | POA: Diagnosis present

## 2019-08-11 DIAGNOSIS — I2510 Atherosclerotic heart disease of native coronary artery without angina pectoris: Secondary | ICD-10-CM | POA: Diagnosis present

## 2019-08-11 DIAGNOSIS — L4050 Arthropathic psoriasis, unspecified: Secondary | ICD-10-CM | POA: Diagnosis present

## 2019-08-11 DIAGNOSIS — U071 COVID-19: Secondary | ICD-10-CM | POA: Diagnosis present

## 2019-08-11 DIAGNOSIS — J189 Pneumonia, unspecified organism: Secondary | ICD-10-CM | POA: Diagnosis not present

## 2019-08-11 DIAGNOSIS — Z8673 Personal history of transient ischemic attack (TIA), and cerebral infarction without residual deficits: Secondary | ICD-10-CM | POA: Diagnosis not present

## 2019-08-11 DIAGNOSIS — D509 Iron deficiency anemia, unspecified: Secondary | ICD-10-CM | POA: Diagnosis present

## 2019-08-11 DIAGNOSIS — E785 Hyperlipidemia, unspecified: Secondary | ICD-10-CM | POA: Diagnosis present

## 2019-08-11 DIAGNOSIS — Z8249 Family history of ischemic heart disease and other diseases of the circulatory system: Secondary | ICD-10-CM | POA: Diagnosis not present

## 2019-08-11 DIAGNOSIS — Z87891 Personal history of nicotine dependence: Secondary | ICD-10-CM | POA: Diagnosis not present

## 2019-08-11 DIAGNOSIS — I340 Nonrheumatic mitral (valve) insufficiency: Secondary | ICD-10-CM | POA: Diagnosis not present

## 2019-08-11 DIAGNOSIS — Z7982 Long term (current) use of aspirin: Secondary | ICD-10-CM | POA: Diagnosis not present

## 2019-08-11 DIAGNOSIS — M25569 Pain in unspecified knee: Secondary | ICD-10-CM | POA: Diagnosis present

## 2019-08-11 LAB — HIV ANTIBODY (ROUTINE TESTING W REFLEX): HIV Screen 4th Generation wRfx: NONREACTIVE

## 2019-08-11 LAB — CBC
HCT: 29.9 % — ABNORMAL LOW (ref 36.0–46.0)
Hemoglobin: 9.1 g/dL — ABNORMAL LOW (ref 12.0–15.0)
MCH: 24.8 pg — ABNORMAL LOW (ref 26.0–34.0)
MCHC: 30.4 g/dL (ref 30.0–36.0)
MCV: 81.5 fL (ref 80.0–100.0)
Platelets: 339 10*3/uL (ref 150–400)
RBC: 3.67 MIL/uL — ABNORMAL LOW (ref 3.87–5.11)
RDW: 16.4 % — ABNORMAL HIGH (ref 11.5–15.5)
WBC: 16.3 10*3/uL — ABNORMAL HIGH (ref 4.0–10.5)
nRBC: 0 % (ref 0.0–0.2)

## 2019-08-11 LAB — MRSA PCR SCREENING: MRSA by PCR: NEGATIVE

## 2019-08-11 LAB — CREATININE, SERUM
Creatinine, Ser: 0.45 mg/dL (ref 0.44–1.00)
GFR calc Af Amer: >60 mL/min (ref >60)
GFR calc non Af Amer: >60 mL/min (ref >60)

## 2019-08-11 LAB — STREP PNEUMONIAE URINARY ANTIGEN: Strep Pneumo Urinary Antigen: NEGATIVE

## 2019-08-11 LAB — SARS CORONAVIRUS 2 (TAT 6-24 HRS): SARS Coronavirus 2: NEGATIVE

## 2019-08-11 MED ORDER — ACETAMINOPHEN ER 650 MG PO TBCR: 650.0000 mg | EXTENDED_RELEASE_TABLET | Freq: Three times a day (TID) | ORAL | Status: DC | PRN

## 2019-08-11 MED ORDER — POTASSIUM CHLORIDE CRYS ER 20 MEQ PO TBCR
40.0000 meq | EXTENDED_RELEASE_TABLET | Freq: Every day | ORAL | Status: DC
  Administered 2019-08-11 – 2019-08-17 (×7): 40 meq via ORAL
  Filled 2019-08-11 (×7): qty 2

## 2019-08-11 MED ORDER — HYDROCHLOROTHIAZIDE 12.5 MG PO CAPS: 12.5000 mg | ORAL_CAPSULE | Freq: Every day | ORAL | Status: DC

## 2019-08-11 MED ORDER — PREGABALIN 75 MG PO CAPS
150.0000 mg | ORAL_CAPSULE | Freq: Three times a day (TID) | ORAL | Status: DC
  Administered 2019-08-11 – 2019-08-17 (×19): 150 mg via ORAL
  Filled 2019-08-11 (×19): qty 2

## 2019-08-11 MED ORDER — ACETAMINOPHEN 325 MG PO TABS
650.0000 mg | ORAL_TABLET | Freq: Four times a day (QID) | ORAL | Status: DC | PRN
  Administered 2019-08-12: 650 mg via ORAL
  Filled 2019-08-11 (×2): qty 2

## 2019-08-11 MED ORDER — BUPRENORPHINE HCL 450 MCG BU FILM: 1.0000 | ORAL_FILM | Freq: Two times a day (BID) | BUCCAL | Status: DC

## 2019-08-11 MED ORDER — ATORVASTATIN CALCIUM 40 MG PO TABS
40.0000 mg | ORAL_TABLET | Freq: Every day | ORAL | Status: DC
  Administered 2019-08-11 – 2019-08-17 (×7): 40 mg via ORAL
  Filled 2019-08-11 (×7): qty 1

## 2019-08-11 MED ORDER — LOSARTAN POTASSIUM 50 MG PO TABS: 100.0000 mg | ORAL_TABLET | Freq: Every day | ORAL | Status: DC

## 2019-08-11 MED ORDER — PANTOPRAZOLE SODIUM 40 MG PO TBEC
40.0000 mg | DELAYED_RELEASE_TABLET | Freq: Every day | ORAL | Status: DC | PRN
  Administered 2019-08-13: 40 mg via ORAL
  Filled 2019-08-11: qty 1

## 2019-08-11 MED ORDER — ACETAMINOPHEN 650 MG RE SUPP: 650.0000 mg | Freq: Four times a day (QID) | RECTAL | Status: DC | PRN

## 2019-08-11 MED ORDER — ESCITALOPRAM OXALATE 20 MG PO TABS
20.0000 mg | ORAL_TABLET | Freq: Every day | ORAL | Status: DC
  Administered 2019-08-11 – 2019-08-17 (×7): 20 mg via ORAL
  Filled 2019-08-11 (×7): qty 1

## 2019-08-11 MED ORDER — SODIUM CHLORIDE 0.9 % IV SOLN
500.0000 mg | INTRAVENOUS | Status: DC
  Administered 2019-08-11 – 2019-08-12 (×2): 500 mg via INTRAVENOUS
  Filled 2019-08-11 (×3): qty 500

## 2019-08-11 MED ORDER — ASPIRIN 325 MG PO TABS
325.0000 mg | ORAL_TABLET | Freq: Every day | ORAL | Status: DC
  Administered 2019-08-11 – 2019-08-17 (×7): 325 mg via ORAL
  Filled 2019-08-11 (×7): qty 1

## 2019-08-11 MED ORDER — SENNOSIDES-DOCUSATE SODIUM 8.6-50 MG PO TABS: 1.0000 | ORAL_TABLET | Freq: Every evening | ORAL | Status: DC | PRN

## 2019-08-11 MED ORDER — BISACODYL 5 MG PO TBEC: 5.0000 mg | DELAYED_RELEASE_TABLET | Freq: Every day | ORAL | Status: DC | PRN

## 2019-08-11 MED ORDER — ENSURE ENLIVE PO LIQD
237.0000 mL | Freq: Two times a day (BID) | ORAL | Status: DC
  Administered 2019-08-11 – 2019-08-17 (×13): 237 mL via ORAL

## 2019-08-11 MED ORDER — ENOXAPARIN SODIUM 40 MG/0.4ML ~~LOC~~ SOLN
40.0000 mg | SUBCUTANEOUS | Status: DC
  Administered 2019-08-11 – 2019-08-16 (×6): 40 mg via SUBCUTANEOUS
  Filled 2019-08-11 (×6): qty 0.4

## 2019-08-11 MED ORDER — ALBUTEROL SULFATE HFA 108 (90 BASE) MCG/ACT IN AERS: 1.0000 | INHALATION_SPRAY | RESPIRATORY_TRACT | Status: DC | PRN

## 2019-08-11 MED ORDER — TRAZODONE HCL 50 MG PO TABS
50.0000 mg | ORAL_TABLET | Freq: Every day | ORAL | Status: DC
  Administered 2019-08-11 – 2019-08-15 (×4): 50 mg via ORAL
  Filled 2019-08-11 (×6): qty 1

## 2019-08-11 MED ORDER — FUROSEMIDE 20 MG PO TABS
20.0000 mg | ORAL_TABLET | Freq: Every day | ORAL | Status: DC
  Administered 2019-08-11: 20 mg via ORAL
  Filled 2019-08-11: qty 1

## 2019-08-11 MED ORDER — SODIUM CHLORIDE 0.9 % IV SOLN
1.0000 g | INTRAVENOUS | Status: DC
  Administered 2019-08-11 – 2019-08-12 (×2): 1 g via INTRAVENOUS
  Filled 2019-08-11: qty 10
  Filled 2019-08-11 (×2): qty 1

## 2019-08-11 NOTE — ED Notes (Signed)
[facility] notified (Taryn) - patient ready for transport 

## 2019-08-11 NOTE — ED Provider Notes (Signed)
4:00 AM  Patient here with multifocal pneumonia and hypoxia.  Currently on 6 L with oxygen saturation of 98 to 100%.  No respiratory distress.  Covid test is negative.  Receiving antibiotics for bacterial pneumonia.  Patient did require Ativan from primary team.  Unable to reconcile medications.  It appears at this time she has a bed at [facility] and is awaiting transport.  Medications will need to be reconciled by hospitalist team.   Ward, Delice Bison, DO 08/11/19 [id number]

## 2019-08-11 NOTE — ED Notes (Signed)
ED TO INPATIENT HANDOFF REPORT  ED Nurse Name and Phone #:  Lattie Haw [phone]  S Name/Age/Gender [name] [name] 48 y.o. female Room/Bed: MH12/MH12  Code Status   Code Status: Not on file  Home/SNF/Other Home Patient oriented to: self, place, time and situation Is this baseline? Yes   Triage Complete: Triage complete  Chief Complaint sob  Triage Note Per EMS:  Pt sent from urgent care with sob x 2 days.  O2 at 3L/[place], 94% pulse ox on oxygen.     Allergies Allergies  Allergen Reactions  . Nsaids Other (See Comments)    stroke CVA   . Benzoin Dermatitis and Other (See Comments)    Localized - Skin Red with Burning Sensation Skin turns red and gets inflamed     Level of Care/Admitting Diagnosis ED Disposition    ED Disposition Condition [facility]: [place] [100102]  Level of Care: Telemetry [5]  Admit to tele based on following criteria: Complex arrhythmia (Bradycardia/Tachycardia)  Covid Evaluation: Symptomatic Person Under Investigation (PUI)  Diagnosis: Multifocal pneumonia [0354656]  Admitting Physician: Etta Quill [phone]  Attending Physician: Etta Quill [[id number]]  PT Class (Do Not Modify): Observation [104]  PT Acc Code (Do Not Modify): Observation [10022]       B Medical/Surgery History Past Medical History:  Diagnosis Date  . Anxiety   . Coronary artery disease   . Hypertension   . Iron deficiency anemia 01/15/2019  . Psoriatic arthritis ([place])   . Stroke [facility])    Past Surgical History:  Procedure Laterality Date  . CARPAL TUNNEL RELEASE     B/L hand  . CHOLECYSTECTOMY    . HERNIA REPAIR    . KNEE ARTHROSCOPY Bilateral 08/2014   [facility] Child[facility]  . TEE WITHOUT CARDIOVERSION  01/22/2012   Procedure: TRANSESOPHAGEAL ECHOCARDIOGRAM (TEE);  Surgeon: Lelon Perla, MD;  Location: [facility] ENDOSCOPY;  Service: Cardiovascular;  Laterality: N/A;     A IV Location/Drains/Wounds Patient  Lines/Drains/Airways Status   Active Line/Drains/Airways    Name:   Placement date:   Placement time:   Site:   Days:   Peripheral IV 08/10/19 Right Antecubital   08/10/19    1800    Antecubital   1   Peripheral IV 08/10/19 Left Antecubital   08/10/19    1801    Antecubital   1   Wound / Incision (Open or Dehisced) Knee Right;Anterior    -    -    Knee             Intake/Output Last 24 hours  Intake/Output Summary (Last 24 hours) at 08/11/2019 0618 Last data filed at 08/10/2019 2021 Gross per 24 hour  Intake 600 ml  Output -  Net 600 ml    Labs/Imaging Results for orders placed or performed during the hospital encounter of 08/10/19 (from the past 48 hour(s))  Lactic acid, plasma     Status: None   Collection Time: 08/10/19  5:59 PM  Result Value Ref Range   Lactic Acid, Venous 1.3 0.5 - 1.9 mmol/L    Comment: Performed at [facility], [address] [place], [place] [address]  CBC WITH DIFFERENTIAL     Status: Abnormal   Collection Time: 08/10/19  5:59 PM  Result Value Ref Range   WBC 17.7 (H) 4.0 - 10.5 K/uL   RBC 3.96 3.87 - 5.11 MIL/uL   Hemoglobin 9.8 (L) 12.0 - 15.0  g/dL   HCT 31.6 (L) 36.0 - 46.0 %   MCV 79.8 (L) 80.0 - 100.0 fL   MCH 24.7 (L) 26.0 - 34.0 pg   MCHC 31.0 30.0 - 36.0 g/dL   RDW 16.2 (H) 11.5 - 15.5 %   Platelets 345 150 - 400 K/uL   nRBC 0.0 0.0 - 0.2 %   Neutrophils Relative % 83 %   Neutro Abs 14.8 (H) 1.7 - 7.7 K/uL   Lymphocytes Relative 10 %   Lymphs Abs 1.8 0.7 - 4.0 K/uL   Monocytes Relative 2 %   Monocytes Absolute 0.4 0.1 - 1.0 K/uL   Eosinophils Relative 3 %   Eosinophils Absolute 0.6 (H) 0.0 - 0.5 K/uL   Basophils Relative 1 %   Basophils Absolute 0.1 0.0 - 0.1 K/uL   Immature Granulocytes 1 %   Abs Immature Granulocytes 0.10 (H) 0.00 - 0.07 K/uL    Comment: Performed at [facility], [address] [place], [place] [address]  Comprehensive metabolic panel     Status: Abnormal   Collection Time: 08/10/19   5:59 PM  Result Value Ref Range   Sodium 135 135 - 145 mmol/L   Potassium 3.2 (L) 3.5 - 5.1 mmol/L   Chloride 92 (L) 98 - 111 mmol/L   CO2 31 22 - 32 mmol/L   Glucose, Bld 111 (H) 70 - 99 mg/dL   BUN 14 6 - 20 mg/dL   Creatinine, Ser 0.54 0.44 - 1.00 mg/dL   Calcium 8.6 (L) 8.9 - 10.3 mg/dL   Total Protein 6.5 6.5 - 8.1 g/dL   Albumin 3.2 (L) 3.5 - 5.0 g/dL   AST 29 15 - 41 U/L   ALT 13 0 - 44 U/L   Alkaline Phosphatase 108 38 - 126 U/L   Total Bilirubin 0.3 0.3 - 1.2 mg/dL   GFR calc non Af Amer >60 >60 mL/min   GFR calc Af Amer >60 >60 mL/min   Anion gap 12 5 - 15    Comment: Performed at [facility], [address] [place], [place] [address]  D-dimer, quantitative     Status: Abnormal   Collection Time: 08/10/19  5:59 PM  Result Value Ref Range   D-Dimer, Quant 3.01 (H) 0.00 - 0.50 ug/mL-FEU    Comment: (NOTE) At the manufacturer cut-off of 0.50 ug/mL FEU, this assay has been documented to exclude PE with a sensitivity and negative predictive value of 97 to 99%.  At this time, this assay has not been approved by the FDA to exclude DVT/VTE. Results should be correlated with clinical presentation. Performed at [facility], [address] [place], [place] [address]   Procalcitonin     Status: None   Collection Time: 08/10/19  5:59 PM  Result Value Ref Range   Procalcitonin 0.34 ng/mL    Comment:        Interpretation: PCT (Procalcitonin) <= 0.5 ng/mL: Systemic infection (sepsis) is not likely. Local bacterial infection is possible. (NOTE)       Sepsis PCT Algorithm           Lower Respiratory Tract                                      Infection PCT Algorithm    ----------------------------     ----------------------------  PCT < 0.25 ng/mL                PCT < 0.10 ng/mL         Strongly encourage             Strongly discourage   discontinuation of antibiotics    initiation of antibiotics    ----------------------------      -----------------------------       PCT 0.25 - 0.50 ng/mL            PCT 0.10 - 0.25 ng/mL               OR       >80% decrease in PCT            Discourage initiation of                                            antibiotics      Encourage discontinuation           of antibiotics    ----------------------------     -----------------------------         PCT >= 0.50 ng/mL              PCT 0.26 - 0.50 ng/mL               AND        <80% decrease in PCT             Encourage initiation of                                             antibiotics       Encourage continuation           of antibiotics    ----------------------------     -----------------------------        PCT >= 0.50 ng/mL                  PCT > 0.50 ng/mL               AND         increase in PCT                  Strongly encourage                                      initiation of antibiotics    Strongly encourage escalation           of antibiotics                                     -----------------------------                                           PCT <= 0.25 ng/mL  OR                                        > 80% decrease in PCT                                     Discontinue / Do not initiate                                             antibiotics Performed at [facility], [place] [address], [place], [place] [address]   Lactate dehydrogenase     Status: Abnormal   Collection Time: 08/10/19  5:59 PM  Result Value Ref Range   LDH 400 (H) 98 - 192 U/L    Comment: Performed at [facility], [place] [address], [place], [place] [address]  Ferritin     Status: None   Collection Time: 08/10/19  5:59 PM  Result Value Ref Range   Ferritin 86 11 - 307 ng/mL    Comment: Performed at [facility], [place] [address] [place], [place] [address]  Triglycerides     Status: None   Collection Time: 08/10/19  5:59 PM  Result Value Ref Range   Triglycerides 112 <150  mg/dL    Comment: Performed at [place] [address], [place], [place] [address]  Fibrinogen     Status: Abnormal   Collection Time: 08/10/19  5:59 PM  Result Value Ref Range   Fibrinogen 796 (H) 210 - 475 mg/dL    Comment: Performed at [facility] [address] [place], [place] [address]  C-reactive protein     Status: Abnormal   Collection Time: 08/10/19  5:59 PM  Result Value Ref Range   CRP 21.1 (H) <1.0 mg/dL    Comment: Performed at [facility] [address], [place], [place] [address]  SARS Coronavirus 2 Ag (30 min TAT) - Nasal Swab (BD Veritor Kit)     Status: None   Collection Time: 08/10/19  5:59 PM   Specimen: Nasal Swab (BD Veritor Kit)  Result Value Ref Range   SARS Coronavirus 2 Ag NEGATIVE NEGATIVE    Comment: (NOTE) SARS-CoV-2 antigen NOT DETECTED.  Negative results are presumptive.  Negative results do not preclude SARS-CoV-2 infection and should not be used as the sole basis for treatment or other patient management decisions, including infection  control decisions, particularly in the presence of clinical signs and  symptoms consistent with COVID-19, or in those who have been in contact with the virus.  Negative results must be combined with clinical observations, patient history, and epidemiological information. The expected result is Negative. Fact Sheet for Patients: PodPark.tn Fact Sheet for Healthcare Providers: GiftContent.is This test is not yet approved or cleared by the [place] FDA and  has been authorized for detection and/or diagnosis of SARS-CoV-2 by FDA under an Emergency Use Authorization (EUA).  This EUA will remain in effect (meaning this test can be used) for the duration of  the COVID-19 de claration under Section 564(b)(1) of the Act, 21 U.S.C. section 360bbb-3(b)(1), unless the authorization is terminated or revoked sooner. Performed at [facility]  [employer], [address] [place], [place] [address]   hCG, serum, qualitative     Status: None   Collection Time: 08/10/19  5:59 PM  Result Value Ref Range   Preg, Serum NEGATIVE NEGATIVE    Comment:        THE SENSITIVITY OF THIS METHODOLOGY IS >10 mIU/mL. Performed at [facility], [address] [place], [place] [address]   SARS CORONAVIRUS 2 (TAT 6-24 HRS) Nasopharyngeal Nasopharyngeal Swab     Status: None   Collection Time: 08/10/19  7:00 PM   Specimen: Nasopharyngeal Swab  Result Value Ref Range   SARS Coronavirus 2 NEGATIVE NEGATIVE    Comment: (NOTE) SARS-CoV-2 target nucleic acids are NOT DETECTED. The SARS-CoV-2 RNA is generally detectable in upper and lower respiratory specimens during the acute phase of infection. Negative results do not preclude SARS-CoV-2 infection, do not rule out co-infections with other pathogens, and should not be used as the sole basis for treatment or other patient management decisions. Negative results must be combined with clinical observations, patient history, and epidemiological information. The expected result is Negative. Fact Sheet for Patients: SugarRoll.be Fact Sheet for Healthcare Providers: https://www.woods-mathews.com/ This test is not yet approved or cleared by the [place] FDA and  has been authorized for detection and/or diagnosis of SARS-CoV-2 by FDA under an Emergency Use Authorization (EUA). This EUA will remain  in effect (meaning this test can be used) for the duration of the COVID-19 declaration under Section 56 4(b)(1) of the Act, 21 U.S.C. section 360bbb-3(b)(1), unless the authorization is terminated or revoked sooner. Performed at [facility], [place] [address] [place], [place] [address]   Lactic acid, plasma     Status: None   Collection Time: 08/10/19  8:10 PM  Result Value Ref Range   Lactic Acid, Venous 0.8 0.5 - 1.9 mmol/L     Comment: Performed at [facility], [address] [place], [place] [address]  Pregnancy, urine     Status: None   Collection Time: 08/10/19  8:20 PM  Result Value Ref Range   Preg Test, Ur NEGATIVE NEGATIVE    Comment:        THE SENSITIVITY OF THIS METHODOLOGY IS >20 mIU/mL. Performed at [facility], [address] [place], [place] [address]    Dg Chest [place] 1 View  Result Date: 08/10/2019 CLINICAL DATA:  Shortness of breath EXAM: PORTABLE CHEST 1 VIEW COMPARISON:  08/03/2019 FINDINGS: Mild subpleural patchy opacities in the lungs bilaterally, worrisome for multifocal pneumonia. Relative sparing of the right upper lobe. No pleural effusions. No pneumothorax. The heart is normal in size. IMPRESSION: Multifocal pneumonia. Atypical/viral etiologies (including COVID) are possible. Electronically Signed   By: Julian Hy M.D.   On: 08/10/2019 18:45    Pending Labs Unresulted Labs (From admission, onward)    Start     Ordered   08/10/19 1747  Blood Culture (routine x 2)  BLOOD CULTURE X 2,   STAT     08/10/19 1747          Vitals/Pain Today's Vitals   08/11/19 0345 08/11/19 0430 08/11/19 0515 08/11/19 0600  BP: 112/68 110/67 108/75 112/67  Pulse: 73 74 76 73  Resp: _0 Temp:      TempSrc:      SpO2: 99% 98% 98% 98%  Weight:      Height:      PainSc:  Isolation Precautions Airborne and Contact precautions  Medications Medications  sodium chloride 0.9 % bolus 500 mL (0 mLs Intravenous Stopped 08/10/19 2021)  cefTRIAXone (ROCEPHIN) 1 g in sodium chloride 0.9 % 100 mL IVPB (0 g Intravenous Stopped 08/10/19 1945)  azithromycin (ZITHROMAX) 500 mg in sodium chloride 0.9 % 250 mL IVPB (0 mg Intravenous Stopped 08/10/19 2127)  dexamethasone (DECADRON) injection 6 mg (6 mg Intravenous Given 08/10/19 2002)  ondansetron (ZOFRAN) injection 4 mg (4 mg Intravenous Given 08/10/19 2058)  LORazepam (ATIVAN) injection 1 mg (1 mg Intravenous Given  08/10/19 2210)    Mobility walks Low fall risk   Focused Assessments Pulmonary Assessment Handoff:  Lung sounds: Bilateral Breath Sounds: Diminished L Breath Sounds: Diminished R Breath Sounds: Diminished O2 Device: Nasal Cannula O2 Flow Rate (L/min): 4 L/min      R Recommendations: See Admitting Provider Note  Report given to:   Additional Notes: none

## 2019-08-11 NOTE — Progress Notes (Signed)
Bilateral lower extremity venous duplex has been completed. Preliminary results can be found in CV Proc through chart review.  Results were given to the patient's nurse, Amy.  08/11/19 11:24 AM Carlos Levering RVT

## 2019-08-11 NOTE — ED Notes (Signed)
Called and spoke with spouse Legrand Como, informed that pt being transferred at this time to [facility],

## 2019-08-11 NOTE — H&P (Signed)
History and Physical    [name] [name] [id number] DOB: 11/09/1970 DOA: 08/10/2019  PCP: Robyne Peers, MD  Patient coming from: home/[place]  I have personally briefly reviewed patient's old medical records in [place]  Chief Complaint: SOB  HPI: [name] [name] is a 48 y.o. female with medical history significant of HTN, CAD, Psoriatic arthritis, who presents with fevers, sob and cough.  Patient reports she was in [place] thru Saturday.  She reports she began having fevers up to 100-102F which responded to tylenol.  She reports on Sunday she continued to have fevers, with sob and cough. She had body aches with her fevers. She states when she went to bed Sunday night it was more difficult to breathe and symptoms worsened yesterday and pulse oximetry at home was reading in the 60s prompting her to present to the ER. She reports she has no productive cough, no runny nose or congestion. She denies any recent sick contacts.    She has a history of stroke in 2013, no reported residual deficits.  She denies any tobacco, alcohol or drug use.  She lives at home with her husband and 4 children.   Review of Systems: As per HPI otherwise 10 point review of systems negative.    Past Medical History:  Diagnosis Date  . Anxiety   . Coronary artery disease   . Hypertension   . Iron deficiency anemia 01/15/2019  . Psoriatic arthritis ([place])   . Stroke [facility])     Past Surgical History:  Procedure Laterality Date  . CARPAL TUNNEL RELEASE     B/L hand  . CHOLECYSTECTOMY    . HERNIA REPAIR    . KNEE ARTHROSCOPY Bilateral 08/2014   [facility] Child[facility]  . TEE WITHOUT CARDIOVERSION  01/22/2012   Procedure: TRANSESOPHAGEAL ECHOCARDIOGRAM (TEE);  Surgeon: Lelon Perla, MD;  Location: [facility] ENDOSCOPY;  Service: Cardiovascular;  Laterality: N/A;     reports that she has never smoked. She has never used smokeless tobacco. She reports current alcohol use. She reports  that she does not use drugs.  Allergies  Allergen Reactions  . Nsaids Other (See Comments)    stroke CVA   . Benzoin Dermatitis and Other (See Comments)    Localized - Skin Red with Burning Sensation Skin turns red and gets inflamed     Family History  Problem Relation Age of Onset  . Hypertension Mother   . Hypertension Father      Prior to Admission medications   Medication Sig Start Date End Date Taking? Authorizing Provider  acetaminophen (TYLENOL) 650 MG CR tablet Take 650 mg by mouth every 8 (eight) hours as needed for pain.   Yes [provider]  aspirin 325 MG tablet Take 325 mg by mouth daily.   Yes [provider]  atorvastatin (LIPITOR) 40 MG tablet Take 40 mg by mouth daily. 07/06/19  Yes [provider]  BELBUCA 450 MCG FILM Take 1 strip by mouth 2 (two) times daily. 30 day supply 08/07/19  Yes [provider]  escitalopram (LEXAPRO) 20 MG tablet Take 20 mg by mouth daily.   Yes [provider]  furosemide (LASIX) 20 MG tablet Take 20 mg by mouth daily.   Yes [provider]  hydrochlorothiazide (MICROZIDE) 12.5 MG capsule Take 12.5 mg by mouth daily.   Yes [provider]  losartan (COZAAR) 100 MG tablet Take 100 mg by mouth daily. 07/04/19  Yes [provider]  pantoprazole (PROTONIX) 40 MG tablet Take 40 mg by mouth daily as needed (indigestion).  06/22/19  Yes [provider]  potassium chloride SA (K-DUR) 20 MEQ tablet Take 40 mEq by mouth daily.    Yes [provider]  pregabalin (LYRICA) 75 MG capsule Take 150 mg by mouth 3 (three) times daily.    Yes [provider]  traZODone (DESYREL) 50 MG tablet Take 50 mg by mouth at bedtime.   Yes [provider]  cephALEXin (KEFLEX) 500 MG capsule Take 500 mg by mouth 3 (three) times daily. 90 day supply 08/04/19   [provider]    Physical Exam: Vitals:   08/11/19 0645 08/11/19 0730 08/11/19 0815  08/11/19 0939  BP: 115/62 102/80 110/70 110/64  Pulse: 75 74 73 78  Resp: 16 (!) 23 18 (!) 24  Temp:    98.1 F (36.7 C)  TempSrc:    Oral  SpO2: 98% 96% 95% 100%  Weight:      Height:         Vitals:   08/11/19 0645 08/11/19 0730 08/11/19 0815 08/11/19 0939  BP: 115/62 102/80 110/70 110/64  Pulse: 75 74 73 78  Resp: 16 (!) 23 18 (!) 24  Temp:    98.1 F (36.7 C)  TempSrc:    Oral  SpO2: 98% 96% 95% 100%  Weight:      Height:       Constitutional: NAD, calm, comfortable Eyes: PERRL, lids and conjunctivae normal ENMT: Mucous membranes are moist. Posterior pharynx clear of any exudate or lesions.Normal dentition.  Neck: normal, supple, no masses, no thyromegaly Respiratory: clear to auscultation bilaterally, no wheezing, no crackles. Normal respiratory effort. No accessory muscle use.  Cardiovascular: Regular rate and rhythm, no murmurs / rubs / gallops. No extremity edema. 2+ pedal pulses. No carotid bruits.  Abdomen: no tenderness, no masses palpated. No hepatosplenomegaly. Bowel sounds positive.  Musculoskeletal: no clubbing / cyanosis. No joint deformity upper and lower extremities. Good ROM, no contractures. Normal muscle tone.  Skin: no rashes, lesions, ulcers. No induration Neurologic: CN 2-12 grossly intact. Sensation and sensation grossly non-focal Psychiatric: Normal judgment and insight. Alert and oriented x 3. Normal mood.     Labs on Admission: I have personally reviewed following labs and imaging studies  CBC: Recent Labs  Lab 08/10/19 1759  WBC 17.7*  NEUTROABS 14.8*  HGB 9.8*  HCT 31.6*  MCV 79.8*  PLT 123456   Basic Metabolic Panel: Recent Labs  Lab 08/10/19 1759  NA 135  K 3.2*  CL 92*  CO2 31  GLUCOSE 111*  BUN 14  CREATININE 0.54  CALCIUM 8.6*   GFR: Estimated Creatinine Clearance: 90.6 mL/min (by C-G formula based on SCr of 0.54 mg/dL). Liver Function Tests: Recent Labs  Lab 08/10/19 1759  AST 29  ALT 13  ALKPHOS 108  BILITOT  0.3  PROT 6.5  ALBUMIN 3.2*   No results for input(s): LIPASE, AMYLASE in the last 168 hours. No results for input(s): AMMONIA in the last 168 hours. Coagulation Profile: No results for input(s): INR, PROTIME in the last 168 hours. Cardiac Enzymes: No results for input(s): CKTOTAL, CKMB, CKMBINDEX, TROPONINI in the last 168 hours. BNP (last 3 results) No results for input(s): PROBNP in the last 8760 hours. HbA1C: No results for input(s): HGBA1C in the last 72 hours. CBG: No results for input(s): GLUCAP in the last 168 hours. Lipid Profile: Recent Labs    08/10/19 1759  TRIG 112  Thyroid Function Tests: No results for input(s): TSH, T4TOTAL, FREET4, T3FREE, THYROIDAB in the last 72 hours. Anemia Panel: Recent Labs    08/10/19 1759  FERRITIN 86   Urine analysis:    Component Value Date/Time   COLORURINE YELLOW 07/17/2014 0105   APPEARANCEUR CLEAR 07/17/2014 0105   LABSPEC 1.005 07/17/2014 0105   PHURINE 6.0 07/17/2014 0105   GLUCOSEU NEGATIVE 07/17/2014 0105   HGBUR MODERATE (A) 07/17/2014 0105   BILIRUBINUR NEGATIVE 07/17/2014 0105   KETONESUR NEGATIVE 07/17/2014 0105   PROTEINUR NEGATIVE 07/17/2014 0105   UROBILINOGEN 0.2 07/17/2014 0105   NITRITE NEGATIVE 07/17/2014 0105   LEUKOCYTESUR NEGATIVE 07/17/2014 0105    Radiological Exams on Admission: Dg Chest Port 1 View  Result Date: 08/10/2019 CLINICAL DATA:  Shortness of breath EXAM: PORTABLE CHEST 1 VIEW COMPARISON:  08/03/2019 FINDINGS: Mild subpleural patchy opacities in the lungs bilaterally, worrisome for multifocal pneumonia. Relative sparing of the right upper lobe. No pleural effusions. No pneumothorax. The heart is normal in size. IMPRESSION: Multifocal pneumonia. Atypical/viral etiologies (including COVID) are possible. Electronically Signed   By: Julian Hy M.D.   On: 08/10/2019 18:45    EKG: Independently reviewed.  Assessment/Plan [name] [name] is a 48 y.o. female with medical history  significant of HTN, CAD, Psoriatic arthritis, who presents with fevers, sob and cough c/w acute hypoxic resp failure 2/2 community acquired pneumonia  # Acute Hypoxic Resp failure # Sepsis 2/2 Community Acquired pneumonia - needing up to 6L supplementation, CXR with multifocal pneumonia.  Leukocytosis 17,000 at presentation (received steroids after).   - COVID neg, will obtain respiratory panel - continue ceftriaxone and azithromycin  # HTN - continue HCTZ, losartan - continue KCl supplementation  # Microcytic Anemia - drop in about 3-4 grams since July, no overt bleeding now and she is on aspirin - will continue to monitor and trend, should have ambulatory work-up for blood loss  # Elevated D-Dimer - ordered as concern for COVID, but COVID neg, no findings c/w DVT - will obtain venous dopplers  # Hx of CVA - continue aspirin, atorvastatin  # Anxiety/Depression - continue escitalopram, trazodone  # GERD - continue pantoprazole  DVT prophylaxis: Lovenox Code Status: Full Disposition Plan: pending Admission status: medsurg   Truddie Hidden MD [facility] Pager [phone]  If 7PM-7AM, please contact night-coverage [email or web address] Password [email or web address]  08/11/2019, 10:12 AM

## 2019-08-12 ENCOUNTER — Inpatient Hospital Stay (HOSPITAL_COMMUNITY): Payer: BC Managed Care – PPO

## 2019-08-12 DIAGNOSIS — J189 Pneumonia, unspecified organism: Secondary | ICD-10-CM

## 2019-08-12 DIAGNOSIS — D509 Iron deficiency anemia, unspecified: Secondary | ICD-10-CM

## 2019-08-12 DIAGNOSIS — J9601 Acute respiratory failure with hypoxia: Secondary | ICD-10-CM | POA: Diagnosis present

## 2019-08-12 DIAGNOSIS — U071 COVID-19: Principal | ICD-10-CM

## 2019-08-12 DIAGNOSIS — I10 Essential (primary) hypertension: Secondary | ICD-10-CM

## 2019-08-12 DIAGNOSIS — F419 Anxiety disorder, unspecified: Secondary | ICD-10-CM

## 2019-08-12 LAB — FERRITIN
Ferritin: 44 ng/mL (ref 11–307)
Ferritin: 45 ng/mL (ref 11–307)

## 2019-08-12 LAB — RESPIRATORY PANEL BY PCR

## 2019-08-12 LAB — CBC
HCT: 27.8 % — ABNORMAL LOW (ref 36.0–46.0)
Hemoglobin: 8.3 g/dL — ABNORMAL LOW (ref 12.0–15.0)
MCH: 24.4 pg — ABNORMAL LOW (ref 26.0–34.0)
MCHC: 29.9 g/dL — ABNORMAL LOW (ref 30.0–36.0)
MCV: 81.8 fL (ref 80.0–100.0)
Platelets: 295 10*3/uL (ref 150–400)
RBC: 3.4 MIL/uL — ABNORMAL LOW (ref 3.87–5.11)
RDW: 16.7 % — ABNORMAL HIGH (ref 11.5–15.5)
WBC: 14.4 10*3/uL — ABNORMAL HIGH (ref 4.0–10.5)
nRBC: 0 % (ref 0.0–0.2)

## 2019-08-12 LAB — BASIC METABOLIC PANEL
Anion gap: 10 (ref 5–15)
BUN: 11 mg/dL (ref 6–20)
CO2: 30 mmol/L (ref 22–32)
Calcium: 8.3 mg/dL — ABNORMAL LOW (ref 8.9–10.3)
Chloride: 98 mmol/L (ref 98–111)
Creatinine, Ser: 0.41 mg/dL — ABNORMAL LOW (ref 0.44–1.00)
GFR calc Af Amer: >60 mL/min (ref >60)
GFR calc non Af Amer: >60 mL/min (ref >60)
Glucose, Bld: 133 mg/dL — ABNORMAL HIGH (ref 70–99)
Potassium: 3.5 mmol/L (ref 3.5–5.1)
Sodium: 138 mmol/L (ref 135–145)

## 2019-08-12 LAB — BRAIN NATRIURETIC PEPTIDE: B Natriuretic Peptide: 283.1 pg/mL — ABNORMAL HIGH (ref 0.0–100.0)

## 2019-08-12 LAB — URINALYSIS, ROUTINE W REFLEX MICROSCOPIC
Bilirubin Urine: NEGATIVE
Glucose, UA: NEGATIVE mg/dL
Hgb urine dipstick: NEGATIVE
Ketones, ur: NEGATIVE mg/dL
Leukocytes,Ua: NEGATIVE
Nitrite: NEGATIVE
Protein, ur: NEGATIVE mg/dL
Specific Gravity, Urine: 1.008 (ref 1.005–1.030)
pH: 7 (ref 5.0–8.0)

## 2019-08-12 LAB — VITAMIN B12: Vitamin B-12: 300 pg/mL (ref 180–914)

## 2019-08-12 LAB — SARS CORONAVIRUS 2 (TAT 6-24 HRS): SARS Coronavirus 2: NEGATIVE

## 2019-08-12 LAB — D-DIMER, QUANTITATIVE: D-Dimer, Quant: 2.77 ug/mL-FEU — ABNORMAL HIGH (ref 0.00–0.50)

## 2019-08-12 LAB — IRON AND TIBC
Iron: 10 ug/dL — ABNORMAL LOW (ref 28–170)
Saturation Ratios: 4 % — ABNORMAL LOW (ref 10.4–31.8)
TIBC: 279 ug/dL (ref 250–450)
UIBC: 269 ug/dL

## 2019-08-12 LAB — LEGIONELLA PNEUMOPHILA SEROGP 1 UR AG: L. pneumophila Serogp 1 Ur Ag: NEGATIVE

## 2019-08-12 LAB — PROCALCITONIN: Procalcitonin: 0.1 ng/mL

## 2019-08-12 LAB — C-REACTIVE PROTEIN: CRP: 9.3 mg/dL — ABNORMAL HIGH (ref <1.0)

## 2019-08-12 LAB — MAGNESIUM: Magnesium: 2.4 mg/dL (ref 1.7–2.4)

## 2019-08-12 LAB — FOLATE: Folate: 6.8 ng/mL (ref >5.9)

## 2019-08-12 MED ORDER — SODIUM CHLORIDE (PF) 0.9 % IJ SOLN
INTRAMUSCULAR | Status: AC
  Filled 2019-08-12: qty 50

## 2019-08-12 MED ORDER — METHYLPREDNISOLONE SODIUM SUCC 125 MG IJ SOLR
60.0000 mg | Freq: Two times a day (BID) | INTRAMUSCULAR | Status: DC
  Administered 2019-08-12 – 2019-08-15 (×7): 60 mg via INTRAVENOUS
  Filled 2019-08-12 (×8): qty 2

## 2019-08-12 MED ORDER — IOHEXOL 350 MG/ML SOLN
100.0000 mL | Freq: Once | INTRAVENOUS | Status: AC | PRN
  Administered 2019-08-12: 100 mL via INTRAVENOUS

## 2019-08-12 MED ORDER — ZOLPIDEM TARTRATE 5 MG PO TABS
5.0000 mg | ORAL_TABLET | Freq: Once | ORAL | Status: AC
  Administered 2019-08-12: 5 mg via ORAL
  Filled 2019-08-12: qty 1

## 2019-08-12 MED ORDER — SODIUM CHLORIDE 0.9 % IV SOLN
INTRAVENOUS | Status: DC
  Administered 2019-08-12: 10:00:00 via INTRAVENOUS

## 2019-08-12 NOTE — Progress Notes (Signed)
Pt refused IV fluids at the present time, want to speak with MD during rounding. Will follow-up. SRP,RN

## 2019-08-12 NOTE — Progress Notes (Addendum)
Respiratory panel status: Pending  PPE- Droplet. SRP,RN

## 2019-08-12 NOTE — Progress Notes (Addendum)
PROGRESS NOTE    [name] [name]  VVO:[id number] DOB: [birth year] DOA: 08/10/2019 PCP: Robyne Peers, MD    Brief Narrative:  HPI per Dr. Magnus Ivan [name] [name] is a 48 y.o. female with medical history significant of HTN, CAD, Psoriatic arthritis, who presents with fevers, sob and cough.  Patient reports she was in [facility] thru Saturday.  She reports she began having fevers up to 100-102F which responded to tylenol.  She reports on Sunday she continued to have fevers, with sob and cough. She had body aches with her fevers. She states when she went to bed Sunday night it was more difficult to breathe and symptoms worsened yesterday and pulse oximetry at home was reading in the 60s prompting her to present to the ER. She reports she has no productive cough, no runny nose or congestion. She denies any recent sick contacts.    She has a history of stroke in 2013, no reported residual deficits.  She denies any tobacco, alcohol or drug use.  She lives at home with her husband and 4 children.   Assessment & Plan:   Principal Problem:   Acute respiratory failure with hypoxia (HCC) Active Problems:   COVID-19 virus infection: Presumed   Hypertension   Anxiety   Chronic knee pain   Iron deficiency anemia   Multifocal pneumonia   Community acquired pneumonia  1 acute respiratory failure with hypoxia likely secondary to presumed COVID-19 virus infection Patient presented with sudden onset fevers as high as 102, shortness of breath on minimal exertion, nonproductive cough, body aches with fevers.  Patient noted to have elevated D-dimer on admission in addition to CRP.  Procalcitonin was 0.10.  Ferritin noted at 45.  Chest x-ray done concerning for multifocal pneumonia.  Atypical/viral etiologies including Covid are possible.  CT angiogram chest was done which was negative for PE however did show interval development of diffuse bilateral airspace process likely multifocal pneumonia.   No effusion.  1.1 cm subcarinal lymph node likely reactive.  Bacterial viral origin is possible.  Point-of-care SARS COVID-19 antigen which was done on admission was negative.  SARS COVID-19 PCR done was negative on admission.  Patient however clinically in addition to lab work, chest x-ray and CT findings is concerning for possible presumed COVID-19 virus infection.  Check a BNP.  Repeat SARS COVID-19 PCR.  Placed on airborne and contact precautions.  Placed on Solu-Medrol 60 mg IV every 12 hours.  May need to be started on IV remdesivir which we will order per pharmacy to see if patient qualifies.  Saline lock IV fluids as patient refusing IV fluids.  Continue empiric IV antibiotics.  Supportive care.  2.  Multifocal pneumonia See problem #1.  Urine strep pneumococcus antigen negative.  Urine Legionella antigen pending.  Procalcitonin levels at 0.10.  Initial SARS COVID-19 PCR and antigen were negative.  However high clinical suspicion for COVID-19 virus infection as such we will repeat the SARS COVID-19 PCR.  Continue empiric IV antibiotics for now.  3.  Hypertension Blood pressure noted to be borderline and slightly hypotensive on admission.  We will hold patient's antihypertensive medications.  Patient refusing IV fluids at this time.  Follow.  4.  Iron deficiency anemia Patient with no overt bleeding.  Hemoglobin currently at 8.3.  Check an anemia panel.  Follow H&H.  Transfusion threshold hemoglobin less than 7.  Will likely need oral iron supplementation on discharge.  5.  Hyperlipidemia Continue statin.  6.  Anxiety/depression Continue  Lexapro.  Continue trazodone at bedtime.   DVT prophylaxis: Lovenox Code Status: Full Family Communication: Updated patient.  No family at bedside.  Tried to call husband to update him however telephone went straight to voicemail. Disposition Plan: To be determined.  Likely home when clinically improved.   Consultants:   None  Procedures:    Lower extremity Dopplers 08/11/2019  CT angio chest 08/12/2019  Chest x-ray 08/11/2019  Antimicrobials:   IV azithromycin 08/11/2019  IV Rocephin 08/11/2019   Subjective: Patient sitting up in bed.  Per patient and RN patient with shortness of breath on minimal exertion.  Patient states she feels a little bit better than on admission.  Denies any chest pain.  Patient resistant to IV fluids at this time states her blood pressure usually runs low and is concerned that she will get volume overloaded.  Objective: Vitals:   08/12/19 0455 08/12/19 0542 08/12/19 1346 08/12/19 1357  BP: (!) 85/44 102/60 (!) 106/43 105/63  Pulse: (!) 59 (!) 56 (!) 54 (!) 57  Resp: _0 Temp: 98.3 F (36.8 C)  98.1 F (36.7 C) 98.9 F (37.2 C)  TempSrc: Oral  Oral Oral  SpO2: 98% 98% 100% 96%  Weight:      Height:        Intake/Output Summary (Last 24 hours) at 08/12/2019 1413 Last data filed at 08/12/2019 1300 Gross per 24 hour  Intake 1190.39 ml  Output 1402 ml  Net -211.61 ml   Filed Weights   08/10/19 1740  Weight: 83 kg    Examination:  General exam: Appears calm and comfortable  Respiratory system: Some coarse breath sounds in the bases.  No expiratory wheezing.  No crackles noted.  Speaking in full sentences.  Cardiovascular system: S1 & S2 heard, RRR. No JVD, murmurs, rubs, gallops or clicks. No pedal edema. Gastrointestinal system: Abdomen is nondistended, soft and nontender. No organomegaly or masses felt. Normal bowel sounds heard. Central nervous system: Alert and oriented. No focal neurological deficits. Extremities: Symmetric 5 x 5 power. Skin: No rashes, lesions or ulcers Psychiatry: Judgement and insight appear normal. Mood & affect appropriate.     Data Reviewed: I have personally reviewed following labs and imaging studies  CBC: Recent Labs  Lab 08/10/19 1759 08/11/19 1331 08/12/19 0409  WBC 17.7* 16.3* 14.4*  NEUTROABS 14.8*  --   --   HGB 9.8* 9.1* 8.3*   HCT 31.6* 29.9* 27.8*  MCV 79.8* 81.5 81.8  PLT 345 339 254   Basic Metabolic Panel: Recent Labs  Lab 08/10/19 1759 08/11/19 1331 08/12/19 0409 08/12/19 1136  NA 135  --  138  --   K 3.2*  --  3.5  --   CL 92*  --  98  --   CO2 31  --  30  --   GLUCOSE 111*  --  133*  --   BUN 14  --  11  --   CREATININE 0.54 0.45 0.41*  --   CALCIUM 8.6*  --  8.3*  --   MG  --   --   --  2.4   GFR: Estimated Creatinine Clearance: 90.6 mL/min (A) (by C-G formula based on SCr of 0.41 mg/dL (L)). Liver Function Tests: Recent Labs  Lab 08/10/19 1759  AST 29  ALT 13  ALKPHOS 108  BILITOT 0.3  PROT 6.5  ALBUMIN 3.2*   No results for input(s): LIPASE, AMYLASE in the last 168 hours. No results for input(s):  AMMONIA in the last 168 hours. Coagulation Profile: No results for input(s): INR, PROTIME in the last 168 hours. Cardiac Enzymes: No results for input(s): CKTOTAL, CKMB, CKMBINDEX, TROPONINI in the last 168 hours. BNP (last 3 results) No results for input(s): PROBNP in the last 8760 hours. HbA1C: No results for input(s): HGBA1C in the last 72 hours. CBG: No results for input(s): GLUCAP in the last 168 hours. Lipid Profile: Recent Labs    08/10/19 1759  TRIG 112   Thyroid Function Tests: No results for input(s): TSH, T4TOTAL, FREET4, T3FREE, THYROIDAB in the last 72 hours. Anemia Panel: Recent Labs    08/10/19 1759 08/12/19 1136  VITAMINB12  --  300  FOLATE  --  6.8  FERRITIN 86 44   45  TIBC  --  279  IRON  --  10*   Sepsis Labs: Recent Labs  Lab 08/10/19 1759 08/10/19 2010 08/12/19 1136  PROCALCITON 0.34  --  0.10  LATICACIDVEN 1.3 0.8  --     Recent Results (from the past 240 hour(s))  Blood Culture (routine x 2)     Status: None (Preliminary result)   Collection Time: 08/10/19  5:59 PM   Specimen: BLOOD  Result Value Ref Range Status   Specimen Description   Final    BLOOD RIGHT ANTECUBITAL Performed at [facility], [address]  [place], [place] [address]    Special Requests   Final    BOTTLES DRAWN AEROBIC AND ANAEROBIC Blood Culture adequate volume Performed at [facility], [address] [place], [place] [address]    Culture   Final    NO GROWTH 2 DAYS Performed at [facility], [place] [address] [place], [place] [address]    Report Status PENDING  Incomplete  SARS Coronavirus 2 Ag (30 min TAT) - Nasal Swab (BD Veritor Kit)     Status: None   Collection Time: 08/10/19  5:59 PM   Specimen: Nasal Swab (BD Veritor Kit)  Result Value Ref Range Status   SARS Coronavirus 2 Ag NEGATIVE NEGATIVE Final    Comment: (NOTE) SARS-CoV-2 antigen NOT DETECTED.  Negative results are presumptive.  Negative results do not preclude SARS-CoV-2 infection and should not be used as the sole basis for treatment or other patient management decisions, including infection  control decisions, particularly in the presence of clinical signs and  symptoms consistent with COVID-19, or in those who have been in contact with the virus.  Negative results must be combined with clinical observations, patient history, and epidemiological information. The expected result is Negative. Fact Sheet for Patients: PodPark.tn Fact Sheet for Healthcare Providers: GiftContent.is This test is not yet approved or cleared by the [place] FDA and  has been authorized for detection and/or diagnosis of SARS-CoV-2 by FDA under an Emergency Use Authorization (EUA).  This EUA will remain in effect (meaning this test can be used) for the duration of  the COVID-19 de claration under Section 564(b)(1) of the Act, 21 U.S.C. section 360bbb-3(b)(1), unless the authorization is terminated or revoked sooner. Performed at [facility], [address] [place], [place] [address]   Blood Culture (routine x 2)     Status: None (Preliminary result)   Collection Time: 08/10/19  6:10  PM   Specimen: BLOOD  Result Value Ref Range Status   Specimen Description   Final    BLOOD LEFT ARM Performed at [facility], [address] [place], [place] [address]    Special Requests   Final    BOTTLES DRAWN AEROBIC AND ANAEROBIC Blood Culture adequate volume Performed at [facility], [address] [place], [place] [address]    Culture   Final    NO GROWTH 2 DAYS Performed at [facility], [place] [address] [place], [place] [address]    Report Status PENDING  Incomplete  SARS CORONAVIRUS 2 (TAT 6-24 HRS) Nasopharyngeal Nasopharyngeal Swab     Status: None   Collection Time: 08/10/19  7:00 PM   Specimen: Nasopharyngeal Swab  Result Value Ref Range Status   SARS Coronavirus 2 NEGATIVE NEGATIVE Final    Comment: (NOTE) SARS-CoV-2 target nucleic acids are NOT DETECTED. The SARS-CoV-2 RNA is generally detectable in upper and lower respiratory specimens during the acute phase of infection. Negative results do not preclude SARS-CoV-2 infection, do not rule out co-infections with other pathogens, and should not be used as the sole basis for treatment or other patient management decisions. Negative results must be combined with clinical observations, patient history, and epidemiological information. The expected result is Negative. Fact Sheet for Patients: SugarRoll.be Fact Sheet for Healthcare Providers: https://www.woods-mathews.com/ This test is not yet approved or cleared by the [place] FDA and  has been authorized for detection and/or diagnosis of SARS-CoV-2 by FDA under an Emergency Use Authorization (EUA). This EUA will remain  in effect (meaning this test can be used) for the duration of the COVID-19 declaration under Section 56 4(b)(1) of the Act, 21 U.S.C. section 360bbb-3(b)(1), unless the authorization is terminated or revoked sooner. Performed at [facility], [place] [address],  [place], [place] [address]   MRSA PCR Screening     Status: None   Collection Time: 08/11/19 10:51 AM   Specimen: Nasal Mucosa; Nasopharyngeal  Result Value Ref Range Status   MRSA by PCR NEGATIVE NEGATIVE Final    Comment:        The GeneXpert MRSA Assay (FDA approved for NASAL specimens only), is one component of a comprehensive MRSA colonization surveillance program. It is not intended to diagnose MRSA infection nor to guide or monitor treatment for MRSA infections. Performed at [facility], [place] [address] [place], [place] [address]   Respiratory Panel by PCR     Status: None   Collection Time: 08/11/19  5:08 PM  Result Value Ref Range Status   Adenovirus NOT DETECTED NOT DETECTED Final   Coronavirus 229E NOT DETECTED NOT DETECTED Final    Comment: (NOTE) The Coronavirus on the Respiratory Panel, DOES NOT test for the novel  Coronavirus (2019 nCoV)    Coronavirus HKU1 NOT DETECTED NOT DETECTED Final   Coronavirus NL63 NOT DETECTED NOT DETECTED Final   Coronavirus OC43 NOT DETECTED NOT DETECTED Final   Metapneumovirus NOT DETECTED NOT DETECTED Final   Rhinovirus / Enterovirus NOT DETECTED NOT DETECTED Final   Influenza A NOT DETECTED NOT DETECTED Final   Influenza B NOT DETECTED NOT DETECTED Final   Parainfluenza Virus 1 NOT DETECTED NOT DETECTED Final   Parainfluenza Virus 2 NOT DETECTED NOT DETECTED Final   Parainfluenza Virus 3 NOT DETECTED NOT DETECTED Final   Parainfluenza Virus 4 NOT DETECTED NOT DETECTED Final   Respiratory Syncytial Virus NOT DETECTED NOT DETECTED Final   Bordetella pertussis NOT DETECTED NOT DETECTED Final   Chlamydophila pneumoniae NOT DETECTED NOT DETECTED Final   Mycoplasma pneumoniae NOT DETECTED NOT DETECTED Final    Comment:  Performed at [facility], [place] [address] [place], [place] [address]         Radiology Studies: Ct Angio Chest Pe W Or Wo Contrast  Result Date: 08/12/2019 CLINICAL DATA:  Dyspnea on  exertion and elevated D-dimer. Shortness of breath, fever and cough with hypoxia. EXAM: CT ANGIOGRAPHY CHEST WITH CONTRAST TECHNIQUE: Multidetector CT imaging of the chest was performed using the standard protocol during bolus administration of intravenous contrast. Multiplanar CT image reconstructions and MIPs were obtained to evaluate the vascular anatomy. CONTRAST:  146m OMNIPAQUE IOHEXOL 350 MG/ML SOLN COMPARISON:  08/03/2019 FINDINGS: Cardiovascular: Heart is normal size. No evidence of aortic dissection or aneurysm. Pulmonary arterial system is well opacified without evidence of emboli. Remaining vascular structures are unremarkable. Mediastinum/Nodes: 1.1 cm subcarinal lymph node likely reactive. No hilar adenopathy. Remaining mediastinal structures are unremarkable. Lungs/Pleura: Lungs are adequately inflated with interval development of diffuse bilateral airspace process likely multifocal pneumonia. No evidence of effusion. Airways are normal. Upper Abdomen: No acute findings. Musculoskeletal: Degenerative changes of the spine as well as disc disease and reactive sclerosis over the thoracic spine unchanged. Review of the MIP images confirms the above findings. IMPRESSION: 1.  No evidence of pulmonary embolism. 2. Interval development of diffuse bilateral airspace process likely multifocal pneumonia. No effusion. 1.1 cm subcarinal lymph node likely reactive. Bacterial or viral origin is possible. Electronically Signed   By: DMarin OlpM.D.   On: 08/12/2019 10:58   Dg Chest Port 1 View  Result Date: 08/10/2019 CLINICAL DATA:  Shortness of breath EXAM: PORTABLE CHEST 1 VIEW COMPARISON:  08/03/2019 FINDINGS: Mild subpleural patchy opacities in the lungs bilaterally, worrisome for multifocal pneumonia. Relative sparing of the right upper lobe. No pleural effusions. No pneumothorax. The heart is normal in size. IMPRESSION: Multifocal pneumonia. Atypical/viral etiologies (including COVID) are possible.  Electronically Signed   By: SJulian HyM.D.   On: 08/10/2019 18:45   Vas UKoreaLower Extremity Venous (dvt)  Result Date: 08/11/2019  Lower Venous Study Indications: Elevated Ddimer.  Risk Factors: None identified. Comparison Study: No prior studies. Performing Technologist: GOliver HumRVT  Examination Guidelines: A complete evaluation includes B-mode imaging, spectral Doppler, color Doppler, and power Doppler as needed of all accessible portions of each vessel. Bilateral testing is considered an integral part of a complete examination. Limited examinations for reoccurring indications may be performed as noted.  +---------+---------------+---------+-----------+----------+--------------+  RIGHT     Compressibility Phasicity Spontaneity Properties Thrombus Aging  +---------+---------------+---------+-----------+----------+--------------+  CFV       Full            Yes       Yes                                    +---------+---------------+---------+-----------+----------+--------------+  SFJ       Full                                                             +---------+---------------+---------+-----------+----------+--------------+  FV Prox   Full                                                             +---------+---------------+---------+-----------+----------+--------------+  FV Mid    Full                                                             +---------+---------------+---------+-----------+----------+--------------+  FV Distal Full                                                             +---------+---------------+---------+-----------+----------+--------------+  PFV       Full                                                             +---------+---------------+---------+-----------+----------+--------------+  POP       Full            Yes       Yes                                    +---------+---------------+---------+-----------+----------+--------------+  PTV       Full                                                              +---------+---------------+---------+-----------+----------+--------------+  PERO      Full                                                             +---------+---------------+---------+-----------+----------+--------------+   +---------+---------------+---------+-----------+----------+--------------+  LEFT      Compressibility Phasicity Spontaneity Properties Thrombus Aging  +---------+---------------+---------+-----------+----------+--------------+  CFV       Full            Yes       Yes                                    +---------+---------------+---------+-----------+----------+--------------+  SFJ       Full                                                             +---------+---------------+---------+-----------+----------+--------------+  FV Prox   Full                                                             +---------+---------------+---------+-----------+----------+--------------+  FV Mid    Full                                                             +---------+---------------+---------+-----------+----------+--------------+  FV Distal Full                                                             +---------+---------------+---------+-----------+----------+--------------+  PFV       Full                                                             +---------+---------------+---------+-----------+----------+--------------+  POP       Full            Yes       Yes                                    +---------+---------------+---------+-----------+----------+--------------+  PTV       Full                                                             +---------+---------------+---------+-----------+----------+--------------+  PERO      Full                                                             +---------+---------------+---------+-----------+----------+--------------+     Summary: Right: There is no evidence of deep vein thrombosis in the  lower extremity. No cystic structure found in the popliteal fossa. Left: There is no evidence of deep vein thrombosis in the lower extremity. No cystic structure found in the popliteal fossa.  *See table(s) above for measurements and observations. Electronically signed by Harold Barban MD on 08/11/2019 at 5:22:14 PM.    Final         Scheduled Meds:  aspirin  325 mg Oral Daily   atorvastatin  40 mg Oral Daily   Buprenorphine HCl  1 Film Oral BID   enoxaparin (LOVENOX) injection  40 mg Subcutaneous Q24H   escitalopram  20 mg Oral Daily   feeding supplement (ENSURE ENLIVE)  237 mL Oral BID BM   methylPREDNISolone (SOLU-MEDROL) injection  60 mg Intravenous Q12H   potassium chloride SA  40 mEq Oral Daily   pregabalin  150 mg Oral TID   sodium chloride (PF)       traZODone  50 mg Oral QHS   Continuous Infusions:  sodium chloride Stopped (08/12/19 1128)   azithromycin 500 mg (  08/11/19 2117)   cefTRIAXone (ROCEPHIN)  IV 1 g (08/11/19 2004)     LOS: 1 day    Time spent: 40 minutes    Irine Seal, MD [facility]  If 7PM-7AM, please contact night-coverage [email or web address] 08/12/2019, 2:13 PM

## 2019-08-12 NOTE — Progress Notes (Signed)
Resp panel negative. SRP, RN

## 2019-08-13 ENCOUNTER — Telehealth: Payer: Self-pay | Admitting: Hematology and Oncology

## 2019-08-13 LAB — COMPREHENSIVE METABOLIC PANEL
ALT: 20 U/L (ref 0–44)
AST: 28 U/L (ref 15–41)
Albumin: 2.6 g/dL — ABNORMAL LOW (ref 3.5–5.0)
Alkaline Phosphatase: 90 U/L (ref 38–126)
Anion gap: 11 (ref 5–15)
BUN: 11 mg/dL (ref 6–20)
CO2: 27 mmol/L (ref 22–32)
Calcium: 8.2 mg/dL — ABNORMAL LOW (ref 8.9–10.3)
Chloride: 99 mmol/L (ref 98–111)
Creatinine, Ser: 0.39 mg/dL — ABNORMAL LOW (ref 0.44–1.00)
GFR calc Af Amer: >60 mL/min (ref >60)
GFR calc non Af Amer: >60 mL/min (ref >60)
Glucose, Bld: 135 mg/dL — ABNORMAL HIGH (ref 70–99)
Potassium: 4.6 mmol/L (ref 3.5–5.1)
Sodium: 137 mmol/L (ref 135–145)
Total Bilirubin: 0.2 mg/dL — ABNORMAL LOW (ref 0.3–1.2)
Total Protein: 6 g/dL — ABNORMAL LOW (ref 6.5–8.1)

## 2019-08-13 LAB — URINE CULTURE: Culture: NO GROWTH

## 2019-08-13 LAB — CBC WITH DIFFERENTIAL/PLATELET
Abs Immature Granulocytes: 0.07 10*3/uL (ref 0.00–0.07)
Basophils Absolute: 0 10*3/uL (ref 0.0–0.1)
Basophils Relative: 0 %
Eosinophils Absolute: 0 10*3/uL (ref 0.0–0.5)
Eosinophils Relative: 0 %
HCT: 28.8 % — ABNORMAL LOW (ref 36.0–46.0)
Hemoglobin: 8.3 g/dL — ABNORMAL LOW (ref 12.0–15.0)
Immature Granulocytes: 1 %
Lymphocytes Relative: 13 %
Lymphs Abs: 1.1 10*3/uL (ref 0.7–4.0)
MCH: 24.1 pg — ABNORMAL LOW (ref 26.0–34.0)
MCHC: 28.8 g/dL — ABNORMAL LOW (ref 30.0–36.0)
MCV: 83.7 fL (ref 80.0–100.0)
Monocytes Absolute: 0.2 10*3/uL (ref 0.1–1.0)
Monocytes Relative: 3 %
Neutro Abs: 7.5 10*3/uL (ref 1.7–7.7)
Neutrophils Relative %: 83 %
Platelets: 344 10*3/uL (ref 150–400)
RBC: 3.44 MIL/uL — ABNORMAL LOW (ref 3.87–5.11)
RDW: 16.6 % — ABNORMAL HIGH (ref 11.5–15.5)
WBC: 8.9 10*3/uL (ref 4.0–10.5)
nRBC: 0 % (ref 0.0–0.2)

## 2019-08-13 LAB — C-REACTIVE PROTEIN: CRP: 6.1 mg/dL — ABNORMAL HIGH (ref <1.0)

## 2019-08-13 LAB — BRAIN NATRIURETIC PEPTIDE: B Natriuretic Peptide: 536.7 pg/mL — ABNORMAL HIGH (ref 0.0–100.0)

## 2019-08-13 LAB — D-DIMER, QUANTITATIVE: D-Dimer, Quant: 1.66 ug/mL-FEU — ABNORMAL HIGH (ref 0.00–0.50)

## 2019-08-13 LAB — FERRITIN: Ferritin: 33 ng/mL (ref 11–307)

## 2019-08-13 LAB — PROCALCITONIN: Procalcitonin: <0.1 ng/mL

## 2019-08-13 MED ORDER — FUROSEMIDE 10 MG/ML IJ SOLN
20.0000 mg | Freq: Two times a day (BID) | INTRAMUSCULAR | Status: AC
  Administered 2019-08-14: 20 mg via INTRAVENOUS
  Filled 2019-08-13 (×2): qty 2

## 2019-08-13 MED ORDER — SODIUM CHLORIDE 0.9 % IV SOLN
200.0000 mg | Freq: Once | INTRAVENOUS | Status: AC
  Administered 2019-08-13: 200 mg via INTRAVENOUS
  Filled 2019-08-13: qty 200

## 2019-08-13 MED ORDER — FUROSEMIDE 20 MG PO TABS
20.0000 mg | ORAL_TABLET | Freq: Every day | ORAL | Status: DC
  Administered 2019-08-13 – 2019-08-17 (×5): 20 mg via ORAL
  Filled 2019-08-13 (×5): qty 1

## 2019-08-13 MED ORDER — SODIUM CHLORIDE 0.9 % IV SOLN
100.0000 mg | Freq: Every day | INTRAVENOUS | Status: AC
  Administered 2019-08-14 – 2019-08-17 (×4): 100 mg via INTRAVENOUS
  Filled 2019-08-13 (×4): qty 100

## 2019-08-13 MED ORDER — TRAMADOL HCL 50 MG PO TABS
100.0000 mg | ORAL_TABLET | Freq: Four times a day (QID) | ORAL | Status: DC | PRN
  Administered 2019-08-13 – 2019-08-17 (×12): 100 mg via ORAL
  Filled 2019-08-13 (×12): qty 2

## 2019-08-13 MED ORDER — LEVOFLOXACIN 750 MG PO TABS: 750.0000 mg | ORAL_TABLET | Freq: Every day | ORAL | Status: DC

## 2019-08-13 NOTE — Progress Notes (Signed)
IN with pt to administer am med and first dose of Remedesivir. Educated pt on medication. Pharmacy consults and questions addressed. Pt called spouse and decided to wait until after MD rounds to decide on whether she will take med. MD notified via text. SRP, RN

## 2019-08-13 NOTE — Progress Notes (Addendum)
Pt had discussion with MD and med administered (Remdisivir) as ordered. Questions addressed and education provided. SRP,RN

## 2019-08-13 NOTE — Plan of Care (Signed)
  Problem: Activity: Goal: Ability to tolerate increased activity will improve Outcome: Progressing   Problem: Clinical Measurements: Goal: Ability to maintain a body temperature in the normal range will improve Outcome: Progressing   Problem: Respiratory: Goal: Ability to maintain adequate ventilation will improve Outcome: Progressing   Problem: Education: Goal: Knowledge of General Education information will improve Description: Including pain rating scale, medication(s)/side effects and non-pharmacologic comfort measures Outcome: Progressing   Problem: Clinical Measurements: Goal: Ability to maintain clinical measurements within normal limits will improve Outcome: Progressing Goal: Diagnostic test results will improve Outcome: Progressing Goal: Respiratory complications will improve Outcome: Progressing

## 2019-08-13 NOTE — Progress Notes (Signed)
error 

## 2019-08-13 NOTE — Progress Notes (Addendum)
PROGRESS NOTE    [name] [name]  KKX:[id number] DOB: 14-Aug-1971 DOA: 08/10/2019 PCP: Robyne Peers, MD    Brief Narrative:  HPI per Dr. Magnus Ivan [name] [name] is a 48 y.o. female with medical history significant of HTN, CAD, Psoriatic arthritis, who presents with fevers, sob and cough.  Patient reports she was in [place] thru Saturday.  She reports she began having fevers up to 100-102F which responded to tylenol.  She reports on Sunday she continued to have fevers, with sob and cough. She had body aches with her fevers. She states when she went to bed Sunday night it was more difficult to breathe and symptoms worsened yesterday and pulse oximetry at home was reading in the 60s prompting her to present to the ER. She reports she has no productive cough, no runny nose or congestion. She denies any recent sick contacts.    She has a history of stroke in 2013, no reported residual deficits.  She denies any tobacco, alcohol or drug use.  She lives at home with her husband and 4 children.   Assessment & Plan:   Principal Problem:   Acute respiratory failure with hypoxia (HCC) Active Problems:   COVID-19 virus infection: Presumed   Hypertension   Anxiety   Chronic knee pain   Iron deficiency anemia   Multifocal pneumonia   Community acquired pneumonia  1 acute respiratory failure with hypoxia likely secondary to presumed COVID-19 virus infection Patient presented with sudden onset fevers as high as 102, shortness of breath on minimal exertion, nonproductive cough, body aches with fevers.  Patient noted to have elevated D-dimer on admission in addition to CRP.  Procalcitonin was 0.10.  Ferritin noted at 45.  Chest x-ray done concerning for multifocal pneumonia.  Atypical/viral etiologies including Covid are possible.  CT angiogram chest was done which was negative for PE however did show interval development of diffuse bilateral airspace process likely multifocal pneumonia.   No effusion.  1.1 cm subcarinal lymph node likely reactive.  Bacterial viral origin is possible.  Point-of-care SARS COVID-19 antigen which was done on admission was negative.  SARS COVID-19 PCR done was negative on admission.  Patient however high clinical suspicion in addition to lab work, chest x-ray and CT findings is concerning for possible presumed COVID-19 virus infection.  BNP noted to be elevated at 283.1 with repeat BNP at 536.7 this morning.  Patient noted to have a urine output of 3.1 L over the past 24 hours.  Due to high clinical suspicion repeat SARS COVID-19 PCR was done which was negative.  Inflammatory markers slowly trending down.  Continue Solu-Medrol 60 mg IV every 12 hours.  Will give Lasix 20 mg IV every 12 hours x2 doses.  Although SARS COVID-19 PCR was negative x2 patient still with a high clinical suspicion.  Case discussed/curb sided with ID, Dr. Johnnye Sima and PCCM, Dr.Wert who were in agreement with treating patient as a PUI.  IV remdesivir has been reordered.  Discontinue IV Rocephin and azithromycin and placed empirically on oral Levaquin to complete a 5 to 7-day course of antibiotic treatment.  Supportive care.  Patient very hesitant to be placed on IV remdesivir as she feels her SARS COVID-19 PCR was negative x2.  Follow.   2.  Multifocal pneumonia See problem #1.  Urine strep pneumococcus antigen negative.  Urine Legionella antigen negative.  Procalcitonin levels  < 0.10.  Initial SARS COVID-19 PCR and antigen were negative.  However high clinical suspicion for COVID-19  virus infection as such SARS COVID-19 PCR was repeated which was negative.  Patient currently on IV Rocephin and IV azithromycin which will discontinue and placed on oral Levaquin to complete a 5 to 7-day course of treatment.  3.  Hypertension Blood pressure noted to be borderline and slightly hypotensive on admission.  Patient's antihypertensive medications were held.  Blood pressure has improved.  Resume home  dose oral Lasix.  Follow for now.    4.  Iron deficiency anemia Patient with no overt bleeding.  Hemoglobin currently at 8.3.  Anemia panel consistent with severe iron deficiency anemia with iron level of 10, ferritin of 45, folate of 6.8, vitamin B12 of 300. Transfusion threshold hemoglobin less than 7.  Will likely need oral iron supplementation on discharge.  5.  Hyperlipidemia Continue statin.  6.  Anxiety/depression Continue Lexapro.  Continue trazodone at bedtime.   DVT prophylaxis: Lovenox Code Status: Full Family Communication: Updated patient.  No family at bedside.  Tried to call husband but phone went straight to voicemail. Disposition Plan: To be determined.  Likely home when clinically improved.   Consultants:   Curb sided ID, Dr. Johnnye Sima 08/13/2019  Curb sided PCCM, Dr. Melvyn Novas 08/13/2019  Procedures:   Lower extremity Dopplers 08/11/2019  CT angio chest 08/12/2019  Chest x-ray 08/11/2019  Antimicrobials:   IV azithromycin 08/11/2019>>>>>> 08/13/2019  IV Rocephin 08/11/2019>>>>>> 08/13/2019  Oral Levaquin 08/13/2019  IV remdesivir 08/13/2019   Subjective: Patient sitting up in bed.  States she is feeling better.  Stated shortness of breath on minimal exertion has improved.  Oxygen requirements improved.  Patient very resistant and hesitant to being given IV remdesivir as second COVID-19 PCR test was negative.  Patient with good urine output over the past 24 hours.   Objective: Vitals:   08/12/19 1357 08/12/19 2050 08/13/19 0518 08/13/19 1214  BP: 105/63 112/75 (!) 151/87 (!) 154/85  Pulse: (!) 57 (!) 54 (!) 51 (!) 53  Resp:  (!) '21 18 17  ' Temp: 98.9 F (37.2 C) 98.6 F (37 C) 97.7 F (36.5 C) 98.6 F (37 C)  TempSrc: Oral Oral Oral Oral  SpO2: 96% 98% 99% 98%  Weight:      Height:        Intake/Output Summary (Last 24 hours) at 08/13/2019 1338 Last data filed at 08/13/2019 0400 Gross per 24 hour  Intake 710.8 ml  Output 1501 ml  Net -790.2 ml     Filed Weights   08/10/19 1740  Weight: 83 kg    Examination:  General exam: NAD Respiratory system: Decreased coarse breath sounds in the bases.  No expiratory wheezing.  No crackles noted.  Speaking in full sentences.  No use of accessory muscles of respiration.  Cardiovascular system: Regular rate and rhythm no murmurs rubs or gallops.  No JVD.  No lower extremity edema.   Gastrointestinal system: Abdomen is soft, nontender, nondistended, positive bowel sounds.  No rebound.  No guarding.  Central nervous system: Alert and oriented. No focal neurological deficits. Extremities: Symmetric 5 x 5 power. Skin: No rashes, lesions or ulcers Psychiatry: Judgement and insight appear normal. Mood & affect appropriate.     Data Reviewed: I have personally reviewed following labs and imaging studies  CBC: Recent Labs  Lab 08/10/19 1759 08/11/19 1331 08/12/19 0409 08/13/19 0421  WBC 17.7* 16.3* 14.4* 8.9  NEUTROABS 14.8*  --   --  7.5  HGB 9.8* 9.1* 8.3* 8.3*  HCT 31.6* 29.9* 27.8* 28.8*  MCV 79.8* 81.5 81.8  83.7  PLT 345 339 295 629   Basic Metabolic Panel: Recent Labs  Lab 08/10/19 1759 08/11/19 1331 08/12/19 0409 08/12/19 1136 08/13/19 0421  NA 135  --  138  --  137  K 3.2*  --  3.5  --  4.6  CL 92*  --  98  --  99  CO2 31  --  30  --  27  GLUCOSE 111*  --  133*  --  135*  BUN 14  --  11  --  11  CREATININE 0.54 0.45 0.41*  --  0.39*  CALCIUM 8.6*  --  8.3*  --  8.2*  MG  --   --   --  2.4  --    GFR: Estimated Creatinine Clearance: 90.6 mL/min (A) (by C-G formula based on SCr of 0.39 mg/dL (L)). Liver Function Tests: Recent Labs  Lab 08/10/19 1759 08/13/19 0421  AST 29 28  ALT 13 20  ALKPHOS 108 90  BILITOT 0.3 0.2*  PROT 6.5 6.0*  ALBUMIN 3.2* 2.6*   No results for input(s): LIPASE, AMYLASE in the last 168 hours. No results for input(s): AMMONIA in the last 168 hours. Coagulation Profile: No results for input(s): INR, PROTIME in the last 168  hours. Cardiac Enzymes: No results for input(s): CKTOTAL, CKMB, CKMBINDEX, TROPONINI in the last 168 hours. BNP (last 3 results) No results for input(s): PROBNP in the last 8760 hours. HbA1C: No results for input(s): HGBA1C in the last 72 hours. CBG: No results for input(s): GLUCAP in the last 168 hours. Lipid Profile: Recent Labs    08/10/19 1759  TRIG 112   Thyroid Function Tests: No results for input(s): TSH, T4TOTAL, FREET4, T3FREE, THYROIDAB in the last 72 hours. Anemia Panel: Recent Labs    08/12/19 1136 08/13/19 0421  VITAMINB12 300  --   FOLATE 6.8  --   FERRITIN 44   45 33  TIBC 279  --   IRON 10*  --    Sepsis Labs: Recent Labs  Lab 08/10/19 1759 08/10/19 2010 08/12/19 1136 08/13/19 0421  PROCALCITON 0.34  --  0.10 <0.10  LATICACIDVEN 1.3 0.8  --   --     Recent Results (from the past 240 hour(s))  Blood Culture (routine x 2)     Status: None (Preliminary result)   Collection Time: 08/10/19  5:59 PM   Specimen: BLOOD  Result Value Ref Range Status   Specimen Description   Final    BLOOD RIGHT ANTECUBITAL Performed at [facility], [address] [place], [place] [address]    Special Requests   Final    BOTTLES DRAWN AEROBIC AND ANAEROBIC Blood Culture adequate volume Performed at [facility], [address] [place], [place] [address]    Culture   Final    NO GROWTH 3 DAYS Performed at [facility], [place] [address], [place], [place] [address]    Report Status PENDING  Incomplete  SARS Coronavirus 2 Ag (30 min TAT) - Nasal Swab (BD Veritor Kit)     Status: None   Collection Time: 08/10/19  5:59 PM   Specimen: Nasal Swab (BD Veritor Kit)  Result Value Ref Range Status   SARS Coronavirus 2 Ag NEGATIVE NEGATIVE Final    Comment: (NOTE) SARS-CoV-2 antigen NOT DETECTED.  Negative results are presumptive.  Negative results do not preclude SARS-CoV-2 infection and should not be used as the sole basis for treatment or  other patient management decisions, including infection  control decisions, particularly in the presence of clinical signs and  symptoms consistent with COVID-19, or in those who have been in contact with the virus.  Negative results must be combined with clinical observations, patient history, and epidemiological information. The expected result is Negative. Fact Sheet for Patients: PodPark.tn Fact Sheet for Healthcare Providers: GiftContent.is This test is not yet approved or cleared by the [place] FDA and  has been authorized for detection and/or diagnosis of SARS-CoV-2 by FDA under an Emergency Use Authorization (EUA).  This EUA will remain in effect (meaning this test can be used) for the duration of  the COVID-19 de claration under Section 564(b)(1) of the Act, 21 U.S.C. section 360bbb-3(b)(1), unless the authorization is terminated or revoked sooner. Performed at [facility], [address] [place], [place] [address]   Blood Culture (routine x 2)     Status: None (Preliminary result)   Collection Time: 08/10/19  6:10 PM   Specimen: BLOOD  Result Value Ref Range Status   Specimen Description   Final    BLOOD LEFT ARM Performed at [facility], [address] [place], [place] [address]    Special Requests   Final    BOTTLES DRAWN AEROBIC AND ANAEROBIC Blood Culture adequate volume Performed at [facility], [address] [place], [place] [address]    Culture   Final    NO GROWTH 3 DAYS Performed at [facility], [place] [address], [place], [place] [address]    Report Status PENDING  Incomplete  SARS CORONAVIRUS 2 (TAT 6-24 HRS) Nasopharyngeal Nasopharyngeal Swab     Status: None   Collection Time: 08/10/19  7:00 PM   Specimen: Nasopharyngeal Swab  Result Value Ref Range Status   SARS Coronavirus 2 NEGATIVE NEGATIVE Final    Comment: (NOTE) SARS-CoV-2 target nucleic  acids are NOT DETECTED. The SARS-CoV-2 RNA is generally detectable in upper and lower respiratory specimens during the acute phase of infection. Negative results do not preclude SARS-CoV-2 infection, do not rule out co-infections with other pathogens, and should not be used as the sole basis for treatment or other patient management decisions. Negative results must be combined with clinical observations, patient history, and epidemiological information. The expected result is Negative. Fact Sheet for Patients: SugarRoll.be Fact Sheet for Healthcare Providers: https://www.woods-mathews.com/ This test is not yet approved or cleared by the [place] FDA and  has been authorized for detection and/or diagnosis of SARS-CoV-2 by FDA under an Emergency Use Authorization (EUA). This EUA will remain  in effect (meaning this test can be used) for the duration of the COVID-19 declaration under Section 56 4(b)(1) of the Act, 21 U.S.C. section 360bbb-3(b)(1), unless the authorization is terminated or revoked sooner. Performed at [facility], [place] [address], [place], [place] [address]   MRSA PCR Screening     Status: None   Collection Time: 08/11/19 10:51 AM   Specimen: Nasal Mucosa; Nasopharyngeal  Result Value Ref Range Status   MRSA by PCR NEGATIVE NEGATIVE Final    Comment:        The GeneXpert MRSA Assay (FDA approved for NASAL specimens only), is one component of a comprehensive MRSA colonization surveillance program. It is not intended to diagnose MRSA infection nor to guide or monitor treatment for MRSA infections. Performed at [facility], [place] [address], [place], [place] [address]   Respiratory Panel by  PCR     Status: None   Collection Time: 08/11/19  5:08 PM  Result Value Ref Range Status   Adenovirus NOT DETECTED NOT DETECTED Final   Coronavirus 229E NOT DETECTED NOT DETECTED Final    Comment:  (NOTE) The Coronavirus on the Respiratory Panel, DOES NOT test for the novel  Coronavirus (2019 nCoV)    Coronavirus HKU1 NOT DETECTED NOT DETECTED Final   Coronavirus NL63 NOT DETECTED NOT DETECTED Final   Coronavirus OC43 NOT DETECTED NOT DETECTED Final   Metapneumovirus NOT DETECTED NOT DETECTED Final   Rhinovirus / Enterovirus NOT DETECTED NOT DETECTED Final   Influenza A NOT DETECTED NOT DETECTED Final   Influenza B NOT DETECTED NOT DETECTED Final   Parainfluenza Virus 1 NOT DETECTED NOT DETECTED Final   Parainfluenza Virus 2 NOT DETECTED NOT DETECTED Final   Parainfluenza Virus 3 NOT DETECTED NOT DETECTED Final   Parainfluenza Virus 4 NOT DETECTED NOT DETECTED Final   Respiratory Syncytial Virus NOT DETECTED NOT DETECTED Final   Bordetella pertussis NOT DETECTED NOT DETECTED Final   Chlamydophila pneumoniae NOT DETECTED NOT DETECTED Final   Mycoplasma pneumoniae NOT DETECTED NOT DETECTED Final    Comment: Performed at [facility], [place] [address] [place], [place] [address]  Culture, Urine     Status: None   Collection Time: 08/12/19  9:01 AM   Specimen: Urine, Clean Catch  Result Value Ref Range Status   Specimen Description   Final    URINE, CLEAN CATCH Performed at [facility], [place] [address], [place], [place] [address]    Special Requests   Final    NONE Performed at [facility], [place] [address] [place], [place] [address]    Culture   Final    NO GROWTH Performed at [facility], [place] [address], [place], [place] [address]    Report Status 08/13/2019 FINAL  Final  SARS CORONAVIRUS 2 (TAT 6-24 HRS) Nasopharyngeal Nasopharyngeal Swab     Status: None   Collection Time: 08/12/19  2:16 PM   Specimen: Nasopharyngeal Swab  Result Value Ref Range Status   SARS Coronavirus 2 NEGATIVE NEGATIVE Final    Comment: (NOTE) SARS-CoV-2 target nucleic acids are NOT DETECTED. The SARS-CoV-2 RNA is generally detectable in upper and  lower respiratory specimens during the acute phase of infection. Negative results do not preclude SARS-CoV-2 infection, do not rule out co-infections with other pathogens, and should not be used as the sole basis for treatment or other patient management decisions. Negative results must be combined with clinical observations, patient history, and epidemiological information. The expected result is Negative. Fact Sheet for Patients: SugarRoll.be Fact Sheet for Healthcare Providers: https://www.woods-mathews.com/ This test is not yet approved or cleared by the [place] FDA and  has been authorized for detection and/or diagnosis of SARS-CoV-2 by FDA under an Emergency Use Authorization (EUA). This EUA will remain  in effect (meaning this test can be used) for the duration of the COVID-19 declaration under Section 56 4(b)(1) of the Act, 21 U.S.C. section 360bbb-3(b)(1), unless the authorization is terminated or revoked sooner. Performed at [facility], [place] [address] [place],  [address]          Radiology Studies: CT ANGIO CHEST PE W OR WO CONTRAST  Result Date: 08/12/2019 CLINICAL DATA:  Dyspnea on exertion and elevated D-dimer. Shortness of breath, fever and cough with hypoxia. EXAM: CT ANGIOGRAPHY CHEST WITH CONTRAST TECHNIQUE: Multidetector CT imaging  of the chest was performed using the standard protocol during bolus administration of intravenous contrast. Multiplanar CT image reconstructions and MIPs were obtained to evaluate the vascular anatomy. CONTRAST:  137m OMNIPAQUE IOHEXOL 350 MG/ML SOLN COMPARISON:  08/03/2019 FINDINGS: Cardiovascular: Heart is normal size. No evidence of aortic dissection or aneurysm. Pulmonary arterial system is well opacified without evidence of emboli. Remaining vascular structures are unremarkable. Mediastinum/Nodes: 1.1 cm subcarinal lymph node likely reactive. No hilar adenopathy. Remaining  mediastinal structures are unremarkable. Lungs/Pleura: Lungs are adequately inflated with interval development of diffuse bilateral airspace process likely multifocal pneumonia. No evidence of effusion. Airways are normal. Upper Abdomen: No acute findings. Musculoskeletal: Degenerative changes of the spine as well as disc disease and reactive sclerosis over the thoracic spine unchanged. Review of the MIP images confirms the above findings. IMPRESSION: 1.  No evidence of pulmonary embolism. 2. Interval development of diffuse bilateral airspace process likely multifocal pneumonia. No effusion. 1.1 cm subcarinal lymph node likely reactive. Bacterial or viral origin is possible. Electronically Signed   By: DMarin OlpM.D.   On: 08/12/2019 10:58        Scheduled Meds:  aspirin  325 mg Oral Daily   atorvastatin  40 mg Oral Daily   Buprenorphine HCl  1 Film Oral BID   enoxaparin (LOVENOX) injection  40 mg Subcutaneous Q24H   escitalopram  20 mg Oral Daily   feeding supplement (ENSURE ENLIVE)  237 mL Oral BID BM   furosemide  20 mg Oral Daily   methylPREDNISolone (SOLU-MEDROL) injection  60 mg Intravenous Q12H   potassium chloride SA  40 mEq Oral Daily   pregabalin  150 mg Oral TID   traZODone  50 mg Oral QHS   Continuous Infusions:  azithromycin 500 mg (08/12/19 2156)   cefTRIAXone (ROCEPHIN)  IV 1 g (08/12/19 2057)   remdesivir 200 mg in sodium chloride 0.9% 250 mL IVPB Stopped (08/13/19 1134)   Followed by   [Derrill MemoON 08/14/2019] remdesivir 100 mg in NS 100 mL       LOS: 2 days    Time spent: 40 minutes    DIrine Seal MD [facility]  If 7PM-7AM, please contact night-coverage [email or web address] 08/13/2019, 1:38 PM

## 2019-08-13 NOTE — Plan of Care (Signed)
  Problem: Activity: Goal: Ability to tolerate increased activity will improve Outcome: Progressing   Problem: Clinical Measurements: Goal: Ability to maintain a body temperature in the normal range will improve Outcome: Progressing   Problem: Respiratory: Goal: Ability to maintain adequate ventilation will improve Outcome: Progressing Goal: Ability to maintain a clear airway will improve Outcome: Progressing   

## 2019-08-13 NOTE — Progress Notes (Addendum)
Pharmacy: Remdesivir   [name] [name] is a(n) 48 y.o. female admitted with fevers, SOB, and cough. SARS CoV-2 rapid antigen test x 1 and PCR x 2 all negative, however MD has high clinical suspicion for COVID, and Pharmacy has been consulted for remdesivir dosing. MD confirmed he was aware tests were negative but wished to proceed with Remdesivir.  Assessment: Lab Results  Component Value Date   ALT 20 08/13/2019   ALT 21 03/18/2019     Plan:  . Remdesivir 200 mg IV once followed by 100 mg IV daily x 4 days . Daily CMET while on remdesivir . Follow ALT and clinical condition   Reuel Boom, PharmD, BCPS [phone] 08/13/2019, 9:41 AM

## 2019-08-13 NOTE — Telephone Encounter (Signed)
Returned patient's phone call regarding rescheduling 12/11 appointment, left a voicemail.

## 2019-08-14 ENCOUNTER — Inpatient Hospital Stay: Payer: BC Managed Care – PPO | Admitting: Hematology and Oncology

## 2019-08-14 ENCOUNTER — Inpatient Hospital Stay (HOSPITAL_COMMUNITY): Payer: BC Managed Care – PPO

## 2019-08-14 ENCOUNTER — Inpatient Hospital Stay: Payer: BC Managed Care – PPO

## 2019-08-14 ENCOUNTER — Encounter (HOSPITAL_COMMUNITY): Payer: Self-pay | Admitting: Internal Medicine

## 2019-08-14 DIAGNOSIS — T8452XA Infection and inflammatory reaction due to internal left hip prosthesis, initial encounter: Secondary | ICD-10-CM

## 2019-08-14 DIAGNOSIS — I340 Nonrheumatic mitral (valve) insufficiency: Secondary | ICD-10-CM

## 2019-08-14 HISTORY — DX: Infection and inflammatory reaction due to internal left hip prosthesis, initial encounter: T84.52XA

## 2019-08-14 LAB — CBC WITH DIFFERENTIAL/PLATELET
Abs Immature Granulocytes: 0.25 10*3/uL — ABNORMAL HIGH (ref 0.00–0.07)
Basophils Absolute: 0 10*3/uL (ref 0.0–0.1)
Basophils Relative: 0 %
Eosinophils Absolute: 0 10*3/uL (ref 0.0–0.5)
Eosinophils Relative: 0 %
HCT: 31.2 % — ABNORMAL LOW (ref 36.0–46.0)
Hemoglobin: 9.2 g/dL — ABNORMAL LOW (ref 12.0–15.0)
Immature Granulocytes: 2 %
Lymphocytes Relative: 14 %
Lymphs Abs: 1.6 10*3/uL (ref 0.7–4.0)
MCH: 24.3 pg — ABNORMAL LOW (ref 26.0–34.0)
MCHC: 29.5 g/dL — ABNORMAL LOW (ref 30.0–36.0)
MCV: 82.5 fL (ref 80.0–100.0)
Monocytes Absolute: 0.3 10*3/uL (ref 0.1–1.0)
Monocytes Relative: 3 %
Neutro Abs: 8.8 10*3/uL — ABNORMAL HIGH (ref 1.7–7.7)
Neutrophils Relative %: 81 %
Platelets: 304 10*3/uL (ref 150–400)
RBC: 3.78 MIL/uL — ABNORMAL LOW (ref 3.87–5.11)
RDW: 16.5 % — ABNORMAL HIGH (ref 11.5–15.5)
WBC: 10.9 10*3/uL — ABNORMAL HIGH (ref 4.0–10.5)
nRBC: 0 % (ref 0.0–0.2)

## 2019-08-14 LAB — COMPREHENSIVE METABOLIC PANEL
ALT: 32 U/L (ref 0–44)
AST: 30 U/L (ref 15–41)
Albumin: 3.1 g/dL — ABNORMAL LOW (ref 3.5–5.0)
Alkaline Phosphatase: 97 U/L (ref 38–126)
Anion gap: 10 (ref 5–15)
BUN: 13 mg/dL (ref 6–20)
CO2: 29 mmol/L (ref 22–32)
Calcium: 8.4 mg/dL — ABNORMAL LOW (ref 8.9–10.3)
Chloride: 97 mmol/L — ABNORMAL LOW (ref 98–111)
Creatinine, Ser: 0.39 mg/dL — ABNORMAL LOW (ref 0.44–1.00)
GFR calc Af Amer: >60 mL/min (ref >60)
GFR calc non Af Amer: >60 mL/min (ref >60)
Glucose, Bld: 148 mg/dL — ABNORMAL HIGH (ref 70–99)
Potassium: 4.8 mmol/L (ref 3.5–5.1)
Sodium: 136 mmol/L (ref 135–145)
Total Bilirubin: 0.4 mg/dL (ref 0.3–1.2)
Total Protein: 6.5 g/dL (ref 6.5–8.1)

## 2019-08-14 LAB — ECHOCARDIOGRAM COMPLETE
Height: 64 in
Weight: 3169.6 oz

## 2019-08-14 LAB — FERRITIN: Ferritin: 45 ng/mL (ref 11–307)

## 2019-08-14 LAB — C-REACTIVE PROTEIN: CRP: 3.4 mg/dL — ABNORMAL HIGH (ref <1.0)

## 2019-08-14 LAB — PROCALCITONIN: Procalcitonin: <0.1 ng/mL

## 2019-08-14 LAB — SEDIMENTATION RATE: Sed Rate: 54 mm/hr — ABNORMAL HIGH (ref 0–22)

## 2019-08-14 LAB — BRAIN NATRIURETIC PEPTIDE: B Natriuretic Peptide: 394.6 pg/mL — ABNORMAL HIGH (ref 0.0–100.0)

## 2019-08-14 LAB — D-DIMER, QUANTITATIVE: D-Dimer, Quant: 2.14 ug/mL-FEU — ABNORMAL HIGH (ref 0.00–0.50)

## 2019-08-14 MED ORDER — DIPHENHYDRAMINE HCL 50 MG/ML IJ SOLN
25.0000 mg | Freq: Once | INTRAMUSCULAR | Status: AC
  Administered 2019-08-14: 25 mg via INTRAVENOUS
  Filled 2019-08-14: qty 1

## 2019-08-14 MED ORDER — RIFAMPIN 300 MG PO CAPS
300.0000 mg | ORAL_CAPSULE | Freq: Two times a day (BID) | ORAL | Status: DC
  Administered 2019-08-14: 300 mg via ORAL
  Filled 2019-08-14 (×8): qty 1

## 2019-08-14 MED ORDER — LOSARTAN POTASSIUM 50 MG PO TABS
100.0000 mg | ORAL_TABLET | Freq: Every day | ORAL | Status: DC
  Administered 2019-08-14 – 2019-08-17 (×4): 100 mg via ORAL
  Filled 2019-08-14 (×4): qty 2

## 2019-08-14 MED ORDER — CEFDINIR 300 MG PO CAPS
300.0000 mg | ORAL_CAPSULE | Freq: Two times a day (BID) | ORAL | Status: DC
  Administered 2019-08-14 – 2019-08-17 (×7): 300 mg via ORAL
  Filled 2019-08-14 (×8): qty 1

## 2019-08-14 NOTE — TOC Progression Note (Signed)
Transition of Care [facility]) - Progression Note    Patient Details  Name: [name] [name] MRN: [id number] Date of Birth: [birth year]  [facility]) CM/SW Contact  Purcell Mouton, RN Phone Number: 08/14/2019, 4:48 PM  Clinical Narrative:    Pt from home, will continue to follow for discharge needs. COVID Positive.         Expected Discharge Plan and Services                                                 Social Determinants of Health (SDOH) Interventions    Readmission Risk Interventions No flowsheet data found.

## 2019-08-14 NOTE — Progress Notes (Signed)
  Echocardiogram 2D Echocardiogram has been performed.  [name] [name] A [name] 08/14/2019, 11:53 AM

## 2019-08-14 NOTE — Progress Notes (Addendum)
PROGRESS NOTE    [name] [name]  WUJ:[id number] DOB: July 11, 1971 DOA: 08/10/2019 PCP: Robyne Peers, MD    Brief Narrative:  HPI per Dr. Magnus Ivan [name] [name] is a 48 y.o. female with medical history significant of HTN, CAD, Psoriatic arthritis, who presents with fevers, sob and cough.  Patient reports she was in [place] thru Saturday.  She reports she began having fevers up to 100-102F which responded to tylenol.  She reports on Sunday she continued to have fevers, with sob and cough. She had body aches with her fevers. She states when she went to bed Sunday night it was more difficult to breathe and symptoms worsened yesterday and pulse oximetry at home was reading in the 60s prompting her to present to the ER. She reports she has no productive cough, no runny nose or congestion. She denies any recent sick contacts.    She has a history of stroke in 2013, no reported residual deficits.  She denies any tobacco, alcohol or drug use.  She lives at home with her husband and 4 children.   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  Patient noted to have elevated D-dimer on admission in addition to CRP.  Procalcitonin was 0.10.  Ferritin noted at 45.  Chest x-ray done concerning for multifocal pneumonia.  Atypical/viral etiologies including Covid are possible.  CT angiogram chest was done which was negative for PE  however did show interval development of diffuse bilateral airspace process likely multifocal pneumonia.  No effusion.  1.1 cm subcarinal lymph node likely reactive.  Bacterial viral origin is possible.  Point-of-care SARS COVID-19 antigen which was done on admission was negative.  SARS COVID-19 PCR done was negative on admission.  Patient however high clinical suspicion in addition to lab work, chest x-ray and CT findings is concerning for possible presumed COVID-19 virus infection.  BNP noted to be elevated at 283.1 with repeat BNP at 536.7 this morning.  Patient noted to have a urine output of 3.1 L 08/12/2019.  Urine output not recorded accurately over the past 24 hours.  Due to high clinical suspicion repeat SARS COVID-19 PCR was done which was negative.  Inflammatory markers slowly trending down.  Continue Solu-Medrol 60 mg IV every 12 hours.  Patient status post Lasix 20 mg IV every 12 hours x2 doses on 08/13/2019.  Patient back on home dose oral Lasix.  Although SARS COVID-19 PCR was negative x2 patient still with a high clinical suspicion.  Case discussed/curb sided with ID, Dr. Johnnye Sima and PCCM, Dr.Wert who were in agreement with treating patient as a PUI.  IV remdesivir has been reordered.  Discontinued IV Rocephin and azithromycin and placed empirically on oral Levaquin to complete a 5 to 7-day course of antibiotic treatment.  Change Levaquin to Omnicef.  Check a 2D echo.  Supportive care.  Patient was very hesitant to be placed on IV remdesivir as she feels her SARS COVID-19 PCR  was negative x2.  Patient now in agreement with current treatment regimen.  Follow.   2.  Multifocal pneumonia See problem #1.  Urine strep pneumococcus antigen negative.  Urine Legionella antigen negative.  Procalcitonin levels  < 0.10.  Initial SARS COVID-19 PCR and antigen were negative.  However high clinical suspicion for COVID-19 virus infection as such SARS COVID-19 PCR was repeated which was negative.  Patient was on  IV Rocephin and IV azithromycin which have been changed to oral Levaquin.  Patient to be started on rifampin due to history of recent right hip prosthetic infection and as such we will discontinue Levaquin and placed on Omnicef instead to complete a 5 to 7-day course of antibiotic treatment.   3.  Hypertension Blood pressure noted to be borderline and slightly hypotensive on admission.  Patient's antihypertensive medications were held.  Blood pressure has improved.  Home dose oral Lasix resumed.  Resume Cozaar.  Follow.   4.  Iron deficiency anemia Patient with no overt bleeding.  Hemoglobin currently at 9.2.  Anemia panel consistent with severe iron deficiency anemia with iron level of 10, ferritin of 45, folate of 6.8, vitamin B12 of 300. Transfusion threshold hemoglobin less than 7.  Will likely need oral iron supplementation on discharge.  5.  Hyperlipidemia Continue statin.  6.  Anxiety/depression Stable.  Continue Lexapro and trazodone.  7.  History of recent left hip prosthetic joint infection status post revision of femoral head and linear exchange 05/19/2019 Noted on care everywhere.  Patient following with ID in the outpatient setting and supposed to be on Keflex and rifampin until 08/19/2019.  Will resume rifampin.  Discontinue Levaquin and placed on Omnicef instead due to concerns for QT prolongation with interaction with rifampin and quinolones.   DVT prophylaxis: Lovenox Code Status: Full Family Communication: Updated patient.  No family at bedside.  Tried to call husband but phone went straight to voicemail. Disposition Plan: To be determined.  Likely home when clinically improved with completion of treatment..   Consultants:   Curb sided ID, Dr. Johnnye Sima 08/13/2019  Curb sided PCCM, Dr. Melvyn Novas 08/13/2019  Procedures:   Lower extremity Dopplers 08/11/2019  CT angio chest 08/12/2019  Chest x-ray 08/11/2019  Antimicrobials:   IV azithromycin 08/11/2019>>>>>>  08/13/2019  IV Rocephin 08/11/2019>>>>>> 08/13/2019  Oral Levaquin 08/13/2019 >>>>> 08/14/2019  IV remdesivir 08/13/2019  Omnicef 08/14/2019   Subjective: Patient sitting up in bed.  States she is feeling better.  States shortness of breath is improved with ambulation.  Denies any chest pain.  Sitting up in bed.   Objective: Vitals:   08/13/19 1508 08/13/19 2054 08/14/19 0544 08/14/19 0850  BP: (!) 141/79 140/79 (!) 155/76   Pulse: (!) 50 (!) 53 (!) 51   Resp: '16 18 18   ' Temp: 98.5 F (36.9 C) 98.5 F (36.9 C) 98.1 F (36.7 C)   TempSrc: Oral Oral Oral   SpO2: 100% 98% 100%   Weight:    89.9 kg  Height:        Intake/Output Summary (Last 24 hours) at 08/14/2019 1158 Last data filed at 08/14/2019 0600 Gross per 24 hour  Intake 798.65 ml  Output 0 ml  Net 798.65 ml   Filed Weights   08/10/19 1740 08/14/19 0850  Weight: 83 kg 89.9 kg    Examination:  General exam: NAD Respiratory system: Lungs clear to auscultation bilaterally.  No wheezes, no crackles, no rhonchi.  Speaking in full sentences.  Normal respiratory effort.  No use of  accessory muscles of respiration.  Cardiovascular system: RRR no murmurs rubs or gallops.  No JVD.  No lower extremity edema. Gastrointestinal system: Abdomen is nontender, nondistended, soft, positive bowel sounds.  No rebound.  No guarding.  Central nervous system: Alert and oriented. No focal neurological deficits. Extremities: Symmetric 5 x 5 power. Skin: No rashes, lesions or ulcers Psychiatry: Judgement and insight appear normal. Mood & affect appropriate.     Data Reviewed: I have personally reviewed following labs and imaging studies  CBC: Recent Labs  Lab 08/10/19 1759 08/11/19 1331 08/12/19 0409 08/13/19 0421 08/14/19 0337  WBC 17.7* 16.3* 14.4* 8.9 10.9*  NEUTROABS 14.8*  --   --  7.5 8.8*  HGB 9.8* 9.1* 8.3* 8.3* 9.2*  HCT 31.6* 29.9* 27.8* 28.8* 31.2*  MCV 79.8* 81.5 81.8 83.7 82.5  PLT 345 339 295 344 412    Basic Metabolic Panel: Recent Labs  Lab 08/10/19 1759 08/11/19 1331 08/12/19 0409 08/12/19 1136 08/13/19 0421 08/14/19 0337  NA 135  --  138  --  137 136  K 3.2*  --  3.5  --  4.6 4.8  CL 92*  --  98  --  99 97*  CO2 31  --  30  --  27 29  GLUCOSE 111*  --  133*  --  135* 148*  BUN 14  --  11  --  11 13  CREATININE 0.54 0.45 0.41*  --  0.39* 0.39*  CALCIUM 8.6*  --  8.3*  --  8.2* 8.4*  MG  --   --   --  2.4  --   --    GFR: Estimated Creatinine Clearance: 94.4 mL/min (A) (by C-G formula based on SCr of 0.39 mg/dL (L)). Liver Function Tests: Recent Labs  Lab 08/10/19 1759 08/13/19 0421 08/14/19 0337  AST '29 28 30  ' ALT 13 20 32  ALKPHOS 108 90 97  BILITOT 0.3 0.2* 0.4  PROT 6.5 6.0* 6.5  ALBUMIN 3.2* 2.6* 3.1*   No results for input(s): LIPASE, AMYLASE in the last 168 hours. No results for input(s): AMMONIA in the last 168 hours. Coagulation Profile: No results for input(s): INR, PROTIME in the last 168 hours. Cardiac Enzymes: No results for input(s): CKTOTAL, CKMB, CKMBINDEX, TROPONINI in the last 168 hours. BNP (last 3 results) No results for input(s): PROBNP in the last 8760 hours. HbA1C: No results for input(s): HGBA1C in the last 72 hours. CBG: No results for input(s): GLUCAP in the last 168 hours. Lipid Profile: No results for input(s): CHOL, HDL, LDLCALC, TRIG, CHOLHDL, LDLDIRECT in the last 72 hours. Thyroid Function Tests: No results for input(s): TSH, T4TOTAL, FREET4, T3FREE, THYROIDAB in the last 72 hours. Anemia Panel: Recent Labs    08/12/19 1136 08/13/19 0421 08/14/19 0337  VITAMINB12 300  --   --   FOLATE 6.8  --   --   FERRITIN 44  45 33 45  TIBC 279  --   --   IRON 10*  --   --    Sepsis Labs: Recent Labs  Lab 08/10/19 1759 08/10/19 2010 08/12/19 1136 08/13/19 0421 08/14/19 0337  PROCALCITON 0.34  --  0.10 <0.10 <0.10  LATICACIDVEN 1.3 0.8  --   --   --     Recent Results (from the past 240 hour(s))  Blood Culture  (routine x 2)     Status: None (Preliminary result)   Collection Time: 08/10/19  5:59 PM   Specimen: BLOOD  Result Value  Ref Range Status   Specimen Description   Final    BLOOD RIGHT ANTECUBITAL Performed at [facility], [address] [place], [place] [address]    Special Requests   Final    BOTTLES DRAWN AEROBIC AND ANAEROBIC Blood Culture adequate volume Performed at [facility], [address] [place], [place] [address]    Culture   Final    NO GROWTH 4 DAYS Performed at [facility], [place] [address] [place], [place] [address]    Report Status PENDING  Incomplete  SARS Coronavirus 2 Ag (30 min TAT) - Nasal Swab (BD Veritor Kit)     Status: None   Collection Time: 08/10/19  5:59 PM   Specimen: Nasal Swab (BD Veritor Kit)  Result Value Ref Range Status   SARS Coronavirus 2 Ag NEGATIVE NEGATIVE Final    Comment: (NOTE) SARS-CoV-2 antigen NOT DETECTED.  Negative results are presumptive.  Negative results do not preclude SARS-CoV-2 infection and should not be used as the sole basis for treatment or other patient management decisions, including infection  control decisions, particularly in the presence of clinical signs and  symptoms consistent with COVID-19, or in those who have been in contact with the virus.  Negative results must be combined with clinical observations, patient history, and epidemiological information. The expected result is Negative. Fact Sheet for Patients: PodPark.tn Fact Sheet for Healthcare Providers: GiftContent.is This test is not yet approved or cleared by the [place] FDA and  has been authorized for detection and/or diagnosis of SARS-CoV-2 by FDA under an Emergency Use Authorization (EUA).  This EUA will remain in effect (meaning this test can be used) for the duration of  the COVID-19 de claration under Section 564(b)(1) of the Act, 21 U.S.C. section  360bbb-3(b)(1), unless the authorization is terminated or revoked sooner. Performed at [facility], [address] [place], [place] [address]   Blood Culture (routine x 2)     Status: None (Preliminary result)   Collection Time: 08/10/19  6:10 PM   Specimen: BLOOD  Result Value Ref Range Status   Specimen Description   Final    BLOOD LEFT ARM Performed at [facility], [address] [place], [place] [address]    Special Requests   Final    BOTTLES DRAWN AEROBIC AND ANAEROBIC Blood Culture adequate volume Performed at [facility], [address] [place], [place] [address]    Culture   Final    NO GROWTH 4 DAYS Performed at [facility], [place] [address], [place], [place] [address]    Report Status PENDING  Incomplete  SARS CORONAVIRUS 2 (TAT 6-24 HRS) Nasopharyngeal Nasopharyngeal Swab     Status: None   Collection Time: 08/10/19  7:00 PM   Specimen: Nasopharyngeal Swab  Result Value Ref Range Status   SARS Coronavirus 2 NEGATIVE NEGATIVE Final    Comment: (NOTE) SARS-CoV-2 target nucleic acids are NOT DETECTED. The SARS-CoV-2 RNA is generally detectable in upper and lower respiratory specimens during the acute phase of infection. Negative results do not preclude SARS-CoV-2 infection, do not rule out co-infections with other pathogens, and should not be used as the sole basis for treatment or other patient management decisions. Negative results must be combined with clinical observations, patient history, and epidemiological information. The expected result is Negative. Fact Sheet for Patients: SugarRoll.be Fact Sheet for Healthcare Providers: https://www.woods-mathews.com/ This test is not  yet approved or cleared by the [place] and  has been authorized for detection and/or diagnosis of SARS-CoV-2 by FDA under an Emergency Use Authorization (EUA). This EUA will remain  in effect  (meaning this test can be used) for the duration of the COVID-19 declaration under Section 56 4(b)(1) of the Act, 21 U.S.C. section 360bbb-3(b)(1), unless the authorization is terminated or revoked sooner. Performed at [facility], [place] [address] [place], [place] [address]   MRSA PCR Screening     Status: None   Collection Time: 08/11/19 10:51 AM   Specimen: Nasal Mucosa; Nasopharyngeal  Result Value Ref Range Status   MRSA by PCR NEGATIVE NEGATIVE Final    Comment:        The GeneXpert MRSA Assay (FDA approved for NASAL specimens only), is one component of a comprehensive MRSA colonization surveillance program. It is not intended to diagnose MRSA infection nor to guide or monitor treatment for MRSA infections. Performed at [facility], [place] [address] [place], [place] [address]   Respiratory Panel by PCR     Status: None   Collection Time: 08/11/19  5:08 PM  Result Value Ref Range Status   Adenovirus NOT DETECTED NOT DETECTED Final   Coronavirus 229E NOT DETECTED NOT DETECTED Final    Comment: (NOTE) The Coronavirus on the Respiratory Panel, DOES NOT test for the novel  Coronavirus (2019 nCoV)    Coronavirus HKU1 NOT DETECTED NOT DETECTED Final   Coronavirus NL63 NOT DETECTED NOT DETECTED Final   Coronavirus OC43 NOT DETECTED NOT DETECTED Final   Metapneumovirus NOT DETECTED NOT DETECTED Final   Rhinovirus / Enterovirus NOT DETECTED NOT DETECTED Final   Influenza A NOT DETECTED NOT DETECTED Final   Influenza B NOT DETECTED NOT DETECTED Final   Parainfluenza Virus 1 NOT DETECTED NOT DETECTED Final   Parainfluenza Virus 2 NOT DETECTED NOT DETECTED Final   Parainfluenza Virus 3 NOT DETECTED NOT DETECTED Final   Parainfluenza Virus 4 NOT DETECTED NOT DETECTED Final   Respiratory Syncytial Virus NOT DETECTED NOT DETECTED Final   Bordetella pertussis NOT DETECTED NOT DETECTED Final   Chlamydophila pneumoniae NOT DETECTED NOT DETECTED Final    Mycoplasma pneumoniae NOT DETECTED NOT DETECTED Final    Comment: Performed at [facility], [place], [place], [place] [address]  Culture, Urine     Status: None   Collection Time: 08/12/19  9:01 AM   Specimen: Urine, Clean Catch  Result Value Ref Range Status   Specimen Description   Final    URINE, CLEAN CATCH Performed at [facility], [place] [address] [place], [place] [address]    Special Requests   Final    NONE Performed at [facility], [place] [address], [place], [place] [address]    Culture   Final    NO GROWTH Performed at [facility], [place] [address] [place], [place] [address]    Report Status 08/13/2019 FINAL  Final  SARS CORONAVIRUS 2 (TAT 6-24 HRS) Nasopharyngeal Nasopharyngeal Swab     Status: None   Collection Time: 08/12/19  2:16 PM   Specimen: Nasopharyngeal Swab  Result Value Ref Range Status   SARS Coronavirus 2 NEGATIVE NEGATIVE Final    Comment: (NOTE) SARS-CoV-2 target nucleic acids are NOT DETECTED. The SARS-CoV-2 RNA is generally detectable in upper and lower respiratory specimens during the acute phase of infection. Negative results do not preclude SARS-CoV-2 infection, do not rule  out co-infections with other pathogens, and should not be used as the sole basis for treatment or other patient management decisions. Negative results must be combined with clinical observations, patient history, and epidemiological information. The expected result is Negative. Fact Sheet for Patients: SugarRoll.be Fact Sheet for Healthcare Providers: https://www.woods-mathews.com/ This test is not yet approved or cleared by the [place] FDA and  has been authorized for detection and/or diagnosis of SARS-CoV-2 by FDA under an Emergency Use Authorization (EUA). This EUA will remain  in effect (meaning this test can be used) for the duration of the COVID-19 declaration under  Section 56 4(b)(1) of the Act, 21 U.S.C. section 360bbb-3(b)(1), unless the authorization is terminated or revoked sooner. Performed at [facility], [place] [address] [place], [place] [address]          Radiology Studies: DG CHEST PORT 1 VIEW  Result Date: 08/14/2019 CLINICAL DATA:  Dyspnea. EXAM: PORTABLE CHEST 1 VIEW COMPARISON:  08/10/2019 FINDINGS: Cardiac enlargement. No pleural effusion or edema. Within the periphery of the right lung base there is an airspace opacity compatible with residual pneumonia. Interval resolution of airspace disease within the right midlung, right upper lobe and left lung. IMPRESSION: 1. Persistent lateral right lung base airspace disease compatible with pneumonia. Remaining portions of the lungs are clear. Electronically Signed   By: Kerby Moors M.D.   On: 08/14/2019 09:49        Scheduled Meds: . aspirin  325 mg Oral Daily  . atorvastatin  40 mg Oral Daily  . Buprenorphine HCl  1 Film Oral BID  . cefdinir  300 mg Oral Q12H  . enoxaparin (LOVENOX) injection  40 mg Subcutaneous Q24H  . escitalopram  20 mg Oral Daily  . feeding supplement (ENSURE ENLIVE)  237 mL Oral BID BM  . furosemide  20 mg Intravenous Q12H  . furosemide  20 mg Oral Daily  . losartan  100 mg Oral Daily  . methylPREDNISolone (SOLU-MEDROL) injection  60 mg Intravenous Q12H  . potassium chloride SA  40 mEq Oral Daily  . pregabalin  150 mg Oral TID  . rifampin  300 mg Oral Q12H  . traZODone  50 mg Oral QHS   Continuous Infusions: . remdesivir 100 mg in NS 100 mL 100 mg (08/14/19 0938)     LOS: 3 days    Time spent: 40 minutes    Irine Seal, MD [facility]  If 7PM-7AM, please contact night-coverage [email or web address] 08/14/2019, 11:58 AM

## 2019-08-15 LAB — CULTURE, BLOOD (ROUTINE X 2)
Culture: NO GROWTH
Culture: NO GROWTH
Special Requests: ADEQUATE
Special Requests: ADEQUATE

## 2019-08-15 LAB — CBC WITH DIFFERENTIAL/PLATELET
Abs Immature Granulocytes: 0.5 10*3/uL — ABNORMAL HIGH (ref 0.00–0.07)
Basophils Absolute: 0 10*3/uL (ref 0.0–0.1)
Basophils Relative: 0 %
Eosinophils Absolute: 0 10*3/uL (ref 0.0–0.5)
Eosinophils Relative: 0 %
HCT: 31.3 % — ABNORMAL LOW (ref 36.0–46.0)
Hemoglobin: 9.5 g/dL — ABNORMAL LOW (ref 12.0–15.0)
Immature Granulocytes: 4 %
Lymphocytes Relative: 15 %
Lymphs Abs: 1.8 10*3/uL (ref 0.7–4.0)
MCH: 24.6 pg — ABNORMAL LOW (ref 26.0–34.0)
MCHC: 30.4 g/dL (ref 30.0–36.0)
MCV: 81.1 fL (ref 80.0–100.0)
Monocytes Absolute: 0.4 10*3/uL (ref 0.1–1.0)
Monocytes Relative: 3 %
Neutro Abs: 9.5 10*3/uL — ABNORMAL HIGH (ref 1.7–7.7)
Neutrophils Relative %: 78 %
Platelets: 376 10*3/uL (ref 150–400)
RBC: 3.86 MIL/uL — ABNORMAL LOW (ref 3.87–5.11)
RDW: 16.7 % — ABNORMAL HIGH (ref 11.5–15.5)
WBC: 12.2 10*3/uL — ABNORMAL HIGH (ref 4.0–10.5)
nRBC: 0 % (ref 0.0–0.2)

## 2019-08-15 LAB — FERRITIN: Ferritin: 25 ng/mL (ref 11–307)

## 2019-08-15 LAB — COMPREHENSIVE METABOLIC PANEL
ALT: 26 U/L (ref 0–44)
AST: 15 U/L (ref 15–41)
Albumin: 2.7 g/dL — ABNORMAL LOW (ref 3.5–5.0)
Alkaline Phosphatase: 94 U/L (ref 38–126)
Anion gap: 11 (ref 5–15)
BUN: 14 mg/dL (ref 6–20)
CO2: 29 mmol/L (ref 22–32)
Calcium: 8.3 mg/dL — ABNORMAL LOW (ref 8.9–10.3)
Chloride: 97 mmol/L — ABNORMAL LOW (ref 98–111)
Creatinine, Ser: 0.5 mg/dL (ref 0.44–1.00)
GFR calc Af Amer: >60 mL/min (ref >60)
GFR calc non Af Amer: >60 mL/min (ref >60)
Glucose, Bld: 154 mg/dL — ABNORMAL HIGH (ref 70–99)
Potassium: 4.6 mmol/L (ref 3.5–5.1)
Sodium: 137 mmol/L (ref 135–145)
Total Bilirubin: 0.3 mg/dL (ref 0.3–1.2)
Total Protein: 6.2 g/dL — ABNORMAL LOW (ref 6.5–8.1)

## 2019-08-15 LAB — SEDIMENTATION RATE: Sed Rate: 51 mm/hr — ABNORMAL HIGH (ref 0–22)

## 2019-08-15 LAB — D-DIMER, QUANTITATIVE: D-Dimer, Quant: 1.89 ug/mL-FEU — ABNORMAL HIGH (ref 0.00–0.50)

## 2019-08-15 LAB — C-REACTIVE PROTEIN: CRP: 1.6 mg/dL — ABNORMAL HIGH (ref <1.0)

## 2019-08-15 MED ORDER — HYDROCHLOROTHIAZIDE 12.5 MG PO CAPS
12.5000 mg | ORAL_CAPSULE | Freq: Every day | ORAL | Status: DC
  Administered 2019-08-15 – 2019-08-17 (×3): 12.5 mg via ORAL
  Filled 2019-08-15 (×3): qty 1

## 2019-08-15 MED ORDER — DIPHENHYDRAMINE HCL 25 MG PO CAPS
25.0000 mg | ORAL_CAPSULE | Freq: Every evening | ORAL | Status: DC | PRN
  Administered 2019-08-15: 25 mg via ORAL
  Filled 2019-08-15: qty 1

## 2019-08-15 NOTE — Progress Notes (Signed)
Patient has been running SB on tele, HR of 44-47; pt is sleeping at this time; no c/o chest pain or any distress; provider on call made aware; awaiting for further order; will monitor pt;

## 2019-08-15 NOTE — Progress Notes (Signed)
PROGRESS NOTE    [name] [name]  EHU:[id number] DOB: 1971/04/24 DOA: 08/10/2019 PCP: Robyne Peers, MD    Brief Narrative:  HPI per Dr. Magnus Ivan [name] [name] is a 48 y.o. female with medical history significant of HTN, CAD, Psoriatic arthritis, who presents with fevers, sob and cough.  Patient reports she was in [place] thru Saturday.  She reports she began having fevers up to 100-102F which responded to tylenol.  She reports on Sunday she continued to have fevers, with sob and cough. She had body aches with her fevers. She states when she went to bed Sunday night it was more difficult to breathe and symptoms worsened yesterday and pulse oximetry at home was reading in the 60s prompting her to present to the ER. She reports she has no productive cough, no runny nose or congestion. She denies any recent sick contacts.    She has a history of stroke in 2013, no reported residual deficits.  She denies any tobacco, alcohol or drug use.  She lives at home with her husband and 4 children.   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  Patient noted to have elevated D-dimer on admission in addition to CRP.  Procalcitonin was 0.10.  Ferritin noted at 45.  Chest x-ray done concerning for multifocal pneumonia.  Atypical/viral etiologies including Covid are possible.  CT angiogram chest was done which was negative for PE  however did show interval development of diffuse bilateral airspace process likely multifocal pneumonia.  No effusion.  1.1 cm subcarinal lymph node likely reactive.  Bacterial viral origin is possible.  Point-of-care SARS COVID-19 antigen which was done on admission was negative.  SARS COVID-19 PCR done was negative on admission.  Patient however high clinical suspicion in addition to lab work, chest x-ray and CT findings is concerning for possible presumed COVID-19 virus infection.  BNP noted to be elevated at 283.1 with repeat BNP at 536.7 this morning.  Patient noted to have a urine output of 3.1 L 08/12/2019.  Urine output not recorded accurately over the past 24 hours.  Due to high clinical suspicion repeat SARS COVID-19 PCR was done which was negative.  Inflammatory markers slowly trending down.  Continue Solu-Medrol 60 mg IV every 12 hours.  Patient status post Lasix 20 mg IV every 12 hours x2 doses on 08/13/2019.  Patient back on home dose oral Lasix.  Although SARS COVID-19 PCR was negative x2 patient still with a high clinical suspicion.  Case discussed/curb sided with ID, Dr. Johnnye Sima and PCCM, Dr.Wert who were in agreement with treating patient as a PUI.  IV remdesivir has been reordered.  Discontinued IV Rocephin and azithromycin and placed empirically on oral Levaquin to complete a 5 to 7-day course of antibiotic treatment.  Changed Levaquin to [place].  2D echo with normal EF with no wall motion abnormalities.  Continue supportive care. Patient was very hesitant to be placed on IV remdesivir as  she feels her SARS COVID-19 PCR was negative x2.  Patient now in agreement with current treatment regimen.  Follow.   2.  Multifocal pneumonia See problem #1.  Urine strep pneumococcus antigen negative.  Urine Legionella antigen negative.  Procalcitonin levels  < 0.10.  Initial SARS COVID-19 PCR and antigen were negative.  However high clinical suspicion for COVID-19 virus infection as such SARS COVID-19 PCR  was repeated which was negative.  Patient was on IV Rocephin and IV azithromycin which have been changed to oral Levaquin.  Patient has been resumed on rifampin due to history of recent right hip prosthetic infection and as such Levaquin was discontinued and patient started on Omnicef instead to complete a 5 to 7-day course of antibiotic treatment.  3.  Hypertension Blood pressure noted to be borderline and slightly hypotensive on admission.  Patient's antihypertensive medications were held.  Blood pressure has improved.  Home dose oral Lasix and Cozaar resumed.  Will resume patient's home HCTZ.  Follow.  4.  Iron deficiency anemia Patient with no overt bleeding.  Hemoglobin currently at 9.5.  Anemia panel consistent with severe iron deficiency anemia with iron level of 10, ferritin of 45, folate of 6.8, vitamin B12 of 300. Transfusion threshold hemoglobin less than 7.  Will likely need oral iron supplementation on discharge.  5.  Hyperlipidemia Continue statin.   6.  Anxiety/depression Stable.  Continue Lexapro and trazodone.  7.  History of recent left hip prosthetic joint infection status post revision of femoral head and linear exchange 05/19/2019 Noted on care everywhere.  Patient following with ID in the outpatient setting and supposed to be on Keflex and rifampin until 08/19/2019.  Rifampin was resumed 08/14/2019.  Discontinued Levaquin and placed on Omnicef instead due to concerns for QT prolongation with interaction with rifampin and quinolones.   DVT prophylaxis: Lovenox Code Status: Full Family Communication: Updated patient.  No family at bedside. Disposition Plan: Likely home when clinically improved with completion of treatment..   Consultants:   Curb sided ID, Dr. Johnnye Sima 08/13/2019  Curb sided PCCM, Dr. Melvyn Novas 08/13/2019  Procedures:   Lower extremity Dopplers 08/11/2019  CT angio chest 08/12/2019  Chest x-ray 08/11/2019  Antimicrobials:   IV azithromycin  08/11/2019>>>>>> 08/13/2019  IV Rocephin 08/11/2019>>>>>> 08/13/2019  Oral Levaquin 08/13/2019 >>>>> 08/14/2019  IV remdesivir 08/13/2019  Omnicef 08/14/2019   Subjective: Patient sitting up in bed.  Feeling better daily.  Shortness of breath improving with ambulation.  Denies any chest pain.  Asking to be able to take a shower.   Objective: Vitals:   08/15/19 0200 08/15/19 0536 08/15/19 0617 08/15/19 1157  BP:  (!) 161/87 (!) 148/91 (!) 153/80  Pulse: (!) 47 (!) 45 (!) 46 (!) 57  Resp:  18    Temp:  98.3 F (36.8 C)  98.8 F (37.1 C)  TempSrc:  Oral  Oral  SpO2:  97%  100%  Weight:  89.4 kg    Height:        Intake/Output Summary (Last 24 hours) at 08/15/2019 1328 Last data filed at 08/15/2019 0945 Gross per 24 hour  Intake 1009.86 ml  Output 3400 ml  Net -2390.14 ml   Filed Weights   08/10/19 1740 08/14/19 0850 08/15/19 0536  Weight: 83 kg 89.9 kg 89.4 kg    Examination:  General exam: NAD Respiratory system: CTA B.  No wheezes, no crackles, no rhonchi.  Speaking in full sentences.  Normal respiratory effort.  Cardiovascular system: Regular rate rhythm no murmurs  rubs or gallops.  No JVD.  No lower extremity edema. Gastrointestinal system: Abdomen is soft, nontender, nondistended, positive bowel sounds.  No rebound.  No guarding.  Central nervous system: Alert and oriented. No focal neurological deficits. Extremities: Symmetric 5 x 5 power. Skin: No rashes, lesions or ulcers Psychiatry: Judgement and insight appear normal. Mood & affect appropriate.     Data Reviewed: I have personally reviewed following labs and imaging studies  CBC: Recent Labs  Lab 08/10/19 1759 08/11/19 1331 08/12/19 0409 08/13/19 0421 08/14/19 0337 08/15/19 0300  WBC 17.7* 16.3* 14.4* 8.9 10.9* 12.2*  NEUTROABS 14.8*  --   --  7.5 8.8* 9.5*  HGB 9.8* 9.1* 8.3* 8.3* 9.2* 9.5*  HCT 31.6* 29.9* 27.8* 28.8* 31.2* 31.3*  MCV 79.8* 81.5 81.8 83.7 82.5 81.1  PLT 345 339 295 344 304  326   Basic Metabolic Panel: Recent Labs  Lab 08/10/19 1759 08/11/19 1331 08/12/19 0409 08/12/19 1136 08/13/19 0421 08/14/19 0337 08/15/19 0300  NA 135  --  138  --  137 136 137  K 3.2*  --  3.5  --  4.6 4.8 4.6  CL 92*  --  98  --  99 97* 97*  CO2 31  --  30  --  '27 29 29  ' GLUCOSE 111*  --  133*  --  135* 148* 154*  BUN 14  --  11  --  '11 13 14  ' CREATININE 0.54 0.45 0.41*  --  0.39* 0.39* 0.50  CALCIUM 8.6*  --  8.3*  --  8.2* 8.4* 8.3*  MG  --   --   --  2.4  --   --   --    GFR: Estimated Creatinine Clearance: 94.1 mL/min (by C-G formula based on SCr of 0.5 mg/dL). Liver Function Tests: Recent Labs  Lab 08/10/19 1759 08/13/19 0421 08/14/19 0337 08/15/19 0300  AST '29 28 30 15  ' ALT 13 20 32 26  ALKPHOS 108 90 97 94  BILITOT 0.3 0.2* 0.4 0.3  PROT 6.5 6.0* 6.5 6.2*  ALBUMIN 3.2* 2.6* 3.1* 2.7*   No results for input(s): LIPASE, AMYLASE in the last 168 hours. No results for input(s): AMMONIA in the last 168 hours. Coagulation Profile: No results for input(s): INR, PROTIME in the last 168 hours. Cardiac Enzymes: No results for input(s): CKTOTAL, CKMB, CKMBINDEX, TROPONINI in the last 168 hours. BNP (last 3 results) No results for input(s): PROBNP in the last 8760 hours. HbA1C: No results for input(s): HGBA1C in the last 72 hours. CBG: No results for input(s): GLUCAP in the last 168 hours. Lipid Profile: No results for input(s): CHOL, HDL, LDLCALC, TRIG, CHOLHDL, LDLDIRECT in the last 72 hours. Thyroid Function Tests: No results for input(s): TSH, T4TOTAL, FREET4, T3FREE, THYROIDAB in the last 72 hours. Anemia Panel: Recent Labs    08/14/19 0337 08/15/19 0300  FERRITIN 45 25   Sepsis Labs: Recent Labs  Lab 08/10/19 1759 08/10/19 2010 08/12/19 1136 08/13/19 0421 08/14/19 0337  PROCALCITON 0.34  --  0.10 <0.10 <0.10  LATICACIDVEN 1.3 0.8  --   --   --     Recent Results (from the past 240 hour(s))  Blood Culture (routine x 2)     Status: None    Collection Time: 08/10/19  5:59 PM   Specimen: BLOOD  Result Value Ref Range Status   Specimen Description   Final    BLOOD RIGHT ANTECUBITAL Performed at [facility], [place],  [place], [place] [address]    Special Requests   Final    BOTTLES DRAWN AEROBIC AND ANAEROBIC Blood Culture adequate volume Performed at [facility], [address] [place], [place] [address]    Culture   Final    NO GROWTH 5 DAYS Performed at [facility], [place] [address], [place], [place] [address]    Report Status 08/15/2019 FINAL  Final  SARS Coronavirus 2 Ag (30 min TAT) - Nasal Swab (BD Veritor Kit)     Status: None   Collection Time: 08/10/19  5:59 PM   Specimen: Nasal Swab (BD Veritor Kit)  Result Value Ref Range Status   SARS Coronavirus 2 Ag NEGATIVE NEGATIVE Final    Comment: (NOTE) SARS-CoV-2 antigen NOT DETECTED.  Negative results are presumptive.  Negative results do not preclude SARS-CoV-2 infection and should not be used as the sole basis for treatment or other patient management decisions, including infection  control decisions, particularly in the presence of clinical signs and  symptoms consistent with COVID-19, or in those who have been in contact with the virus.  Negative results must be combined with clinical observations, patient history, and epidemiological information. The expected result is Negative. Fact Sheet for Patients: PodPark.tn Fact Sheet for Healthcare Providers: GiftContent.is This test is not yet approved or cleared by the [place] FDA and  has been authorized for detection and/or diagnosis of SARS-CoV-2 by FDA under an Emergency Use Authorization (EUA).  This EUA will remain in effect (meaning this test can be used) for the duration of  the COVID-19 de claration under Section 564(b)(1) of the Act, 21 U.S.C. section 360bbb-3(b)(1), unless the authorization  is terminated or revoked sooner. Performed at [facility], [address] [place], [place] [address]   Blood Culture (routine x 2)     Status: None   Collection Time: 08/10/19  6:10 PM   Specimen: BLOOD  Result Value Ref Range Status   Specimen Description   Final    BLOOD LEFT ARM Performed at [facility], [address] [place], [place] [address]    Special Requests   Final    BOTTLES DRAWN AEROBIC AND ANAEROBIC Blood Culture adequate volume Performed at [facility], [address] [place], [place] [address]    Culture   Final    NO GROWTH 5 DAYS Performed at [facility], [place] [address], [place], [place] [address]    Report Status 08/15/2019 FINAL  Final  SARS CORONAVIRUS 2 (TAT 6-24 HRS) Nasopharyngeal Nasopharyngeal Swab     Status: None   Collection Time: 08/10/19  7:00 PM   Specimen: Nasopharyngeal Swab  Result Value Ref Range Status   SARS Coronavirus 2 NEGATIVE NEGATIVE Final    Comment: (NOTE) SARS-CoV-2 target nucleic acids are NOT DETECTED. The SARS-CoV-2 RNA is generally detectable in upper and lower respiratory specimens during the acute phase of infection. Negative results do not preclude SARS-CoV-2 infection, do not rule out co-infections with other pathogens, and should not be used as the sole basis for treatment or other patient management decisions. Negative results must be combined with clinical observations, patient history, and epidemiological information. The expected result is Negative. Fact Sheet for Patients: SugarRoll.be Fact Sheet for Healthcare Providers: https://www.woods-mathews.com/ This test is not yet approved or cleared by the [place] FDA and  has been authorized for detection and/or diagnosis of SARS-CoV-2 by FDA under an Emergency Use  Authorization (EUA). This EUA will remain  in effect (meaning this test can be used) for the duration of  the COVID-19 declaration under Section 56 4(b)(1) of the Act, 21 U.S.C. section 360bbb-3(b)(1), unless the authorization is terminated or revoked sooner. Performed at [facility], [place] [address] [place], [place] [address]   MRSA PCR Screening     Status: None   Collection Time: 08/11/19 10:51 AM   Specimen: Nasal Mucosa; Nasopharyngeal  Result Value Ref Range Status   MRSA by PCR NEGATIVE NEGATIVE Final    Comment:        The GeneXpert MRSA Assay (FDA approved for NASAL specimens only), is one component of a comprehensive MRSA colonization surveillance program. It is not intended to diagnose MRSA infection nor to guide or monitor treatment for MRSA infections. Performed at [facility], [place] [address], [place], [place] [address]   Respiratory Panel by PCR     Status: None   Collection Time: 08/11/19  5:08 PM  Result Value Ref Range Status   Adenovirus NOT DETECTED NOT DETECTED Final   Coronavirus 229E NOT DETECTED NOT DETECTED Final    Comment: (NOTE) The Coronavirus on the Respiratory Panel, DOES NOT test for the novel  Coronavirus (2019 nCoV)    Coronavirus HKU1 NOT DETECTED NOT DETECTED Final   Coronavirus NL63 NOT DETECTED NOT DETECTED Final   Coronavirus OC43 NOT DETECTED NOT DETECTED Final   Metapneumovirus NOT DETECTED NOT DETECTED Final   Rhinovirus / Enterovirus NOT DETECTED NOT DETECTED Final   Influenza A NOT DETECTED NOT DETECTED Final   Influenza B NOT DETECTED NOT DETECTED Final   Parainfluenza Virus 1 NOT DETECTED NOT DETECTED Final   Parainfluenza Virus 2 NOT DETECTED NOT DETECTED Final   Parainfluenza Virus 3 NOT DETECTED NOT DETECTED Final   Parainfluenza Virus 4 NOT DETECTED NOT DETECTED Final   Respiratory Syncytial Virus NOT DETECTED NOT DETECTED Final   Bordetella pertussis NOT DETECTED NOT DETECTED Final   Chlamydophila pneumoniae NOT DETECTED NOT DETECTED Final   Mycoplasma pneumoniae NOT DETECTED NOT DETECTED Final     Comment: Performed at [facility], [place], [place], [place] [address]  Culture, Urine     Status: None   Collection Time: 08/12/19  9:01 AM   Specimen: Urine, Clean Catch  Result Value Ref Range Status   Specimen Description   Final    URINE, CLEAN CATCH Performed at [facility], [place] [address] [place], [place] [address]    Special Requests   Final    NONE Performed at [facility], [place] [address], [place], [place] [address]    Culture   Final    NO GROWTH Performed at [facility], [place] [address] [place], [place] [address]    Report Status 08/13/2019 FINAL  Final  SARS CORONAVIRUS 2 (TAT 6-24 HRS) Nasopharyngeal Nasopharyngeal Swab     Status: None   Collection Time: 08/12/19  2:16 PM   Specimen: Nasopharyngeal Swab  Result Value Ref Range Status   SARS Coronavirus 2 NEGATIVE NEGATIVE Final    Comment: (NOTE) SARS-CoV-2 target nucleic acids are NOT DETECTED. The SARS-CoV-2 RNA is generally detectable in upper and lower respiratory specimens during the acute phase of infection. Negative results do not preclude SARS-CoV-2 infection, do not rule out co-infections with other pathogens, and should not be used as the sole basis for treatment or other patient management decisions. Negative results must be combined  with clinical observations, patient history, and epidemiological information. The expected result is Negative. Fact Sheet for Patients: SugarRoll.be Fact Sheet for Healthcare Providers: https://www.woods-mathews.com/ This test is not yet approved or cleared by the [place] FDA and  has been authorized for detection and/or diagnosis of SARS-CoV-2 by FDA under an Emergency Use Authorization (EUA). This EUA will remain  in effect (meaning this test can be used) for the duration of the COVID-19 declaration under Section 56 4(b)(1) of the Act, 21 U.S.C. section  360bbb-3(b)(1), unless the authorization is terminated or revoked sooner. Performed at [facility], [place] [address], [place], [place] [address]          Radiology Studies: DG CHEST PORT 1 VIEW  Result Date: 08/14/2019 CLINICAL DATA:  Dyspnea. EXAM: PORTABLE CHEST 1 VIEW COMPARISON:  08/10/2019 FINDINGS: Cardiac enlargement. No pleural effusion or edema. Within the periphery of the right lung base there is an airspace opacity compatible with residual pneumonia. Interval resolution of airspace disease within the right midlung, right upper lobe and left lung. IMPRESSION: 1. Persistent lateral right lung base airspace disease compatible with pneumonia. Remaining portions of the lungs are clear. Electronically Signed   By: Kerby Moors M.D.   On: 08/14/2019 09:49   ECHOCARDIOGRAM COMPLETE  Result Date: 08/14/2019   ECHOCARDIOGRAM REPORT   Patient Name:   [name] [name] [facility] Date of Exam: 08/14/2019 Medical Rec #:  [id number]           Height:       64.0 in Accession #:    [id number]          Weight:       198.1 lb Date of Birth:  06/17/1971           BSA:          1.95 m Patient Age:    45 years            BP:           155/76 mmHg Patient Gender: F                   HR:           55 bpm. Exam Location:  Inpatient Procedure: 2D Echo Indications:    Dyspnea 786.09 / R06.00  History:        Patient has prior history of Echocardiogram examinations, most                 recent 01/22/2012. Risk Factors:Hypertension. COVID-19 virus                 infection: Presumed                 Acute respiratory failure with hypoxia.  Sonographer:    Vikki Ports Turrentine Referring Phys: [place]  1. Left ventricular ejection fraction, by visual estimation, is 55 to 60%. The left ventricle has normal function. There is no left ventricular hypertrophy.  2. Left ventricular diastolic parameters are indeterminate.  3. The left ventricle has no regional wall motion abnormalities.  4. Global  right ventricle has normal systolic function.The right ventricular size is normal. No increase in right ventricular wall thickness.  5. Left atrial size was moderately dilated.  6. Right atrial size was mildly dilated.  7. The mitral valve is normal in structure. Mild mitral valve regurgitation.  8. The tricuspid valve is normal in structure. Tricuspid valve regurgitation is trivial.  9. The aortic valve  is grossly normal. Aortic valve regurgitation is trivial. 10. The pulmonic valve was normal in structure. Pulmonic valve regurgitation is not visualized. 11. The atrial septum is grossly normal. FINDINGS  Left Ventricle: Left ventricular ejection fraction, by visual estimation, is 55 to 60%. The left ventricle has normal function. The left ventricle has no regional wall motion abnormalities. There is no left ventricular hypertrophy. Left ventricular diastolic parameters are indeterminate. Right Ventricle: The right ventricular size is normal. No increase in right ventricular wall thickness. Global RV systolic function is has normal systolic function. Left Atrium: Left atrial size was moderately dilated. Right Atrium: Right atrial size was mildly dilated Pericardium: There is no evidence of pericardial effusion. Mitral Valve: The mitral valve is normal in structure. Mild mitral valve regurgitation. Tricuspid Valve: The tricuspid valve is normal in structure. Tricuspid valve regurgitation is trivial. Aortic Valve: The aortic valve is grossly normal.. There is mild thickening of the aortic valve. Aortic valve regurgitation is trivial. There is mild thickening of the aortic valve. Aortic valve mean gradient measures 8.0 mmHg. Aortic valve peak gradient  measures 14.9 mmHg. Aortic valve area, by VTI measures 1.99 cm. Pulmonic Valve: The pulmonic valve was normal in structure. Pulmonic valve regurgitation is not visualized. Pulmonic regurgitation is not visualized. Aorta: The aortic root and ascending aorta are  structurally normal, with no evidence of dilitation. IAS/Shunts: The atrial septum is grossly normal.  LEFT VENTRICLE PLAX 2D LVIDd:         5.45 cm  Diastology LVIDs:         3.79 cm  LV e' lateral:   9.25 cm/s LV PW:         0.79 cm  LV E/e' lateral: 12.3 LV IVS:        0.90 cm  LV e' medial:    8.38 cm/s LVOT diam:     2.10 cm  LV E/e' medial:  13.6 LV SV:         83 ml LV SV Index:   40.35 LVOT Area:     3.46 cm  RIGHT VENTRICLE RV S prime:     10.40 cm/s TAPSE (M-mode): 2.8 cm LEFT ATRIUM             Index       RIGHT ATRIUM           Index LA diam:        4.80 cm 2.46 cm/m  RA Area:     20.60 cm LA Vol (A2C):   77.8 ml 39.93 ml/m RA Volume:   63.00 ml  32.33 ml/m LA Vol (A4C):   82.7 ml 42.44 ml/m LA Biplane Vol: 81.1 ml 41.62 ml/m  AORTIC VALVE AV Area (Vmax):    2.08 cm AV Area (Vmean):   2.04 cm AV Area (VTI):     1.99 cm AV Vmax:           193.00 cm/s AV Vmean:          132.000 cm/s AV VTI:            0.493 m AV Peak Grad:      14.9 mmHg AV Mean Grad:      8.0 mmHg LVOT Vmax:         116.00 cm/s LVOT Vmean:        77.600 cm/s LVOT VTI:          0.283 m LVOT/AV VTI ratio: 0.57  AORTA Ao Root diam: 2.90 cm MITRAL VALVE MV  Area (PHT): 3.48 cm              SHUNTS MV PHT:        63.22 msec            Systemic VTI:  0.28 m MV Decel Time: 218 msec              Systemic Diam: 2.10 cm MV E velocity: 114.00 cm/s 103 cm/s MV A velocity: 83.90 cm/s  70.3 cm/s MV E/A ratio:  1.36        1.5  Mertie Moores MD Electronically signed by Mertie Moores MD Signature Date/Time: 08/14/2019/3:26:32 PM    Final         Scheduled Meds: . aspirin  325 mg Oral Daily  . atorvastatin  40 mg Oral Daily  . Buprenorphine HCl  1 Film Oral BID  . cefdinir  300 mg Oral Q12H  . enoxaparin (LOVENOX) injection  40 mg Subcutaneous Q24H  . escitalopram  20 mg Oral Daily  . feeding supplement (ENSURE ENLIVE)  237 mL Oral BID BM  . furosemide  20 mg Oral Daily  . hydrochlorothiazide  12.5 mg Oral Daily  . losartan  100  mg Oral Daily  . methylPREDNISolone (SOLU-MEDROL) injection  60 mg Intravenous Q12H  . potassium chloride SA  40 mEq Oral Daily  . pregabalin  150 mg Oral TID  . rifampin  300 mg Oral Q12H  . traZODone  50 mg Oral QHS   Continuous Infusions: . remdesivir 100 mg in NS 100 mL 100 mg (08/15/19 0952)     LOS: 4 days    Time spent: 40 minutes    Irine Seal, MD [facility]  If 7PM-7AM, please contact night-coverage [email or web address] 08/15/2019, 1:28 PM

## 2019-08-16 LAB — CBC WITH DIFFERENTIAL/PLATELET
Abs Immature Granulocytes: 0.77 10*3/uL — ABNORMAL HIGH (ref 0.00–0.07)
Basophils Absolute: 0.1 10*3/uL (ref 0.0–0.1)
Basophils Relative: 1 %
Eosinophils Absolute: 0 10*3/uL (ref 0.0–0.5)
Eosinophils Relative: 0 %
HCT: 34.9 % — ABNORMAL LOW (ref 36.0–46.0)
Hemoglobin: 10.4 g/dL — ABNORMAL LOW (ref 12.0–15.0)
Immature Granulocytes: 6 %
Lymphocytes Relative: 17 %
Lymphs Abs: 2.2 10*3/uL (ref 0.7–4.0)
MCH: 24 pg — ABNORMAL LOW (ref 26.0–34.0)
MCHC: 29.8 g/dL — ABNORMAL LOW (ref 30.0–36.0)
MCV: 80.4 fL (ref 80.0–100.0)
Monocytes Absolute: 0.5 10*3/uL (ref 0.1–1.0)
Monocytes Relative: 4 %
Neutro Abs: 9.5 10*3/uL — ABNORMAL HIGH (ref 1.7–7.7)
Neutrophils Relative %: 72 %
Platelets: 441 10*3/uL — ABNORMAL HIGH (ref 150–400)
RBC: 4.34 MIL/uL (ref 3.87–5.11)
RDW: 16.4 % — ABNORMAL HIGH (ref 11.5–15.5)
WBC: 13.1 10*3/uL — ABNORMAL HIGH (ref 4.0–10.5)
nRBC: 0 % (ref 0.0–0.2)

## 2019-08-16 LAB — COMPREHENSIVE METABOLIC PANEL
ALT: 21 U/L (ref 0–44)
AST: 13 U/L — ABNORMAL LOW (ref 15–41)
Albumin: 2.7 g/dL — ABNORMAL LOW (ref 3.5–5.0)
Alkaline Phosphatase: 91 U/L (ref 38–126)
Anion gap: 9 (ref 5–15)
BUN: 15 mg/dL (ref 6–20)
CO2: 29 mmol/L (ref 22–32)
Calcium: 8.2 mg/dL — ABNORMAL LOW (ref 8.9–10.3)
Chloride: 98 mmol/L (ref 98–111)
Creatinine, Ser: 0.54 mg/dL (ref 0.44–1.00)
GFR calc Af Amer: >60 mL/min (ref >60)
GFR calc non Af Amer: >60 mL/min (ref >60)
Glucose, Bld: 151 mg/dL — ABNORMAL HIGH (ref 70–99)
Potassium: 4.9 mmol/L (ref 3.5–5.1)
Sodium: 136 mmol/L (ref 135–145)
Total Bilirubin: 0.3 mg/dL (ref 0.3–1.2)
Total Protein: 6 g/dL — ABNORMAL LOW (ref 6.5–8.1)

## 2019-08-16 LAB — D-DIMER, QUANTITATIVE: D-Dimer, Quant: 2.2 ug/mL-FEU — ABNORMAL HIGH (ref 0.00–0.50)

## 2019-08-16 LAB — C-REACTIVE PROTEIN: CRP: 0.9 mg/dL (ref <1.0)

## 2019-08-16 LAB — FERRITIN: Ferritin: 28 ng/mL (ref 11–307)

## 2019-08-16 MED ORDER — PREDNISONE 50 MG PO TABS
60.0000 mg | ORAL_TABLET | Freq: Every day | ORAL | Status: DC
  Administered 2019-08-17: 60 mg via ORAL
  Filled 2019-08-16: qty 1

## 2019-08-16 MED ORDER — METHYLPREDNISOLONE SODIUM SUCC 125 MG IJ SOLR
60.0000 mg | Freq: Every day | INTRAMUSCULAR | Status: DC
  Administered 2019-08-16: 60 mg via INTRAVENOUS
  Filled 2019-08-16: qty 2

## 2019-08-16 MED ORDER — ZOLPIDEM TARTRATE 5 MG PO TABS
5.0000 mg | ORAL_TABLET | Freq: Every evening | ORAL | Status: DC | PRN
  Administered 2019-08-16: 5 mg via ORAL
  Filled 2019-08-16: qty 1

## 2019-08-16 NOTE — Progress Notes (Signed)
PROGRESS NOTE    [name] [name]  LHT:[id number] DOB: October 24, 1970 DOA: 08/10/2019 PCP: Robyne Peers, MD    Brief Narrative:  HPI per Dr. Magnus Ivan [name] [name] is a 48 y.o. female with medical history significant of HTN, CAD, Psoriatic arthritis, who presents with fevers, sob and cough.  Patient reports she was in [place] thru Saturday.  She reports she began having fevers up to 100-102F which responded to tylenol.  She reports on Sunday she continued to have fevers, with sob and cough. She had body aches with her fevers. She states when she went to bed Sunday night it was more difficult to breathe and symptoms worsened yesterday and pulse oximetry at home was reading in the 60s prompting her to present to the ER. She reports she has no productive cough, no runny nose or congestion. She denies any recent sick contacts.    She has a history of stroke in 2013, no reported residual deficits.  She denies any tobacco, alcohol or drug use.  She lives at home with her husband and 4 children.   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  Patient noted to have elevated D-dimer on admission in addition to CRP.  Procalcitonin was 0.10.  Ferritin noted at 45.  Chest x-ray done concerning for multifocal pneumonia.  Atypical/viral etiologies including Covid are possible.  CT angiogram chest was done which was negative for PE  however did show interval development of diffuse bilateral airspace process likely multifocal pneumonia.  No effusion.  1.1 cm subcarinal lymph node likely reactive.  Bacterial viral origin is possible.  Point-of-care SARS COVID-19 antigen which was done on admission was negative.  SARS COVID-19 PCR done was negative on admission.  Patient however high clinical suspicion in addition to lab work, chest x-ray and CT findings is concerning for possible presumed COVID-19 virus infection.  BNP noted to be elevated at 283.1 with repeat BNP at 536.7.  Patient noted to have a urine output of 4.650 L over the past 24 hours. Due to high clinical suspicion repeat SARS COVID-19 PCR was done which was negative.  Inflammatory markers slowly trending down.  Decrease IV Solu-Medrol to 60 mg daily and transition to oral prednisone taper tomorrow. Patient status post Lasix 20 mg IV every 12 hours x2 doses on 08/13/2019.  Patient back on home dose oral Lasix.  Although SARS COVID-19 PCR was negative x2 patient still with a high clinical suspicion.  Case discussed/curb sided with ID, Dr. Johnnye Sima and PCCM, Dr.Wert who were in agreement with treating patient as a PUI.  IV remdesivir has been reordered.  Discontinued IV Rocephin and azithromycin and placed empirically on oral Levaquin to complete a 5 to 7-day course of antibiotic treatment.  Changed Levaquin to [place].  2D echo with normal EF with no wall motion abnormalities.  Continue supportive care. Patient was initially very hesitant to be placed on IV remdesivir as she feels her SARS  COVID-19 PCR was negative x2.  Patient now in agreement with current treatment regimen which should be completed tomorrow.  Hypoxia improved.  Follow.   2.  Multifocal pneumonia See problem #1.  Urine strep pneumococcus antigen negative.  Urine Legionella antigen negative.  Procalcitonin levels  < 0.10.  Initial SARS COVID-19 PCR and antigen were negative.  However high clinical suspicion for  COVID-19 virus infection as such SARS COVID-19 PCR was repeated which was negative.  Patient was on IV Rocephin and IV azithromycin which have been changed to oral Levaquin.  Patient has been resumed on rifampin due to history of recent right hip prosthetic infection and as such Levaquin was discontinued and patient started on Omnicef instead to complete a 5 to 7-day course of antibiotic treatment.  3.  Hypertension Blood pressure noted to be borderline and slightly hypotensive on admission.  Patient's antihypertensive medications were held.  Blood pressure has improved.  Continue Lasix, Cozaar, HCTZ.  Outpatient follow-up.   4.  Iron deficiency anemia Patient with no overt bleeding.  Hemoglobin currently at 10.4.  Anemia panel consistent with severe iron deficiency anemia with iron level of 10, ferritin of 45, folate of 6.8, vitamin B12 of 300. Transfusion threshold hemoglobin less than 7.  Will likely need oral iron supplementation on discharge.  5.  Hyperlipidemia Continue statin.   6.  Anxiety/depression Stable.  Continue Lexapro and trazodone.  7.  History of recent left hip prosthetic joint infection status post revision of femoral head and linear exchange 05/19/2019 Noted on care everywhere.  Patient following with ID in the outpatient setting and supposed to be on Keflex and rifampin until 08/19/2019.  Rifampin was resumed 08/14/2019.  Discontinued Levaquin and placed on Omnicef instead due to concerns for QT prolongation with interaction with rifampin and quinolones.  Patient noted to be refusing rifampin.  Expressed to patient importance of taking rifampin.  Outpatient follow-up with her ID doctor.   DVT prophylaxis: Lovenox Code Status: Full Family Communication: Updated patient.  No family at bedside. Disposition Plan: Likely home when clinically improved with completion of treatment, hopefully tomorrow.   Consultants:   Curb sided ID, Dr. Johnnye Sima 08/13/2019  Curb sided PCCM,  Dr. Melvyn Novas 08/13/2019  Procedures:   Lower extremity Dopplers 08/11/2019  CT angio chest 08/12/2019  Chest x-ray 08/11/2019  Antimicrobials:   IV azithromycin 08/11/2019>>>>>> 08/13/2019  IV Rocephin 08/11/2019>>>>>> 08/13/2019  Oral Levaquin 08/13/2019 >>>>> 08/14/2019  IV remdesivir 08/13/2019  Omnicef 08/14/2019   Subjective: Patient sitting up in bed.  Feeling better daily.  Denies any chest pain.  Shortness of breath improving daily.  Patient noted to be refusing rifampin.  Objective: Vitals:   08/15/19 1157 08/15/19 2045 08/16/19 0406 08/16/19 0411  BP: (!) 153/80 115/86  117/74  Pulse: (!) 57 (!) 59  (!) 52  Resp: '18 16  16  ' Temp: 98.8 F (37.1 C) 98.9 F (37.2 C)  98.8 F (37.1 C)  TempSrc: Oral Oral  Oral  SpO2: 100% 98%  99%  Weight:   88.3 kg   Height:        Intake/Output Summary (Last 24 hours) at 08/16/2019 1239 Last data filed at 08/16/2019 0931 Gross per 24 hour  Intake 0 ml  Output 5050 ml  Net -5050 ml   Filed Weights   08/14/19 0850 08/15/19 0536 08/16/19 0406  Weight: 89.9 kg 89.4 kg 88.3 kg    Examination:  General exam: NAD Respiratory system: Lungs clear to auscultation bilaterally.  No wheezes, no  crackles, no rhonchi.  Normal respiratory effort.  Speaking in full sentences.  Cardiovascular system: RRR no murmurs rubs or gallops.  No JVD.  No lower extremity edema.  Gastrointestinal system: Abdomen is nontender, nondistended, soft, positive bowel sounds.  No rebound.  No guarding. Central nervous system: Alert and oriented. No focal neurological deficits. Extremities: Symmetric 5 x 5 power. Skin: No rashes, lesions or ulcers Psychiatry: Judgement and insight appear normal. Mood & affect appropriate.     Data Reviewed: I have personally reviewed following labs and imaging studies  CBC: Recent Labs  Lab 08/10/19 1759 08/12/19 0409 08/13/19 0421 08/14/19 0337 08/15/19 0300 08/16/19 0311  WBC 17.7* 14.4* 8.9 10.9* 12.2* 13.1*    NEUTROABS 14.8*  --  7.5 8.8* 9.5* 9.5*  HGB 9.8* 8.3* 8.3* 9.2* 9.5* 10.4*  HCT 31.6* 27.8* 28.8* 31.2* 31.3* 34.9*  MCV 79.8* 81.8 83.7 82.5 81.1 80.4  PLT 345 295 344 304 376 517*   Basic Metabolic Panel: Recent Labs  Lab 08/12/19 0409 08/12/19 1136 08/13/19 0421 08/14/19 0337 08/15/19 0300 08/16/19 0311  NA 138  --  137 136 137 136  K 3.5  --  4.6 4.8 4.6 4.9  CL 98  --  99 97* 97* 98  CO2 30  --  '27 29 29 29  ' GLUCOSE 133*  --  135* 148* 154* 151*  BUN 11  --  '11 13 14 15  ' CREATININE 0.41*  --  0.39* 0.39* 0.50 0.54  CALCIUM 8.3*  --  8.2* 8.4* 8.3* 8.2*  MG  --  2.4  --   --   --   --    GFR: Estimated Creatinine Clearance: 93.5 mL/min (by C-G formula based on SCr of 0.54 mg/dL). Liver Function Tests: Recent Labs  Lab 08/10/19 1759 08/13/19 0421 08/14/19 0337 08/15/19 0300 08/16/19 0311  AST '29 28 30 15 ' 13*  ALT 13 20 32 26 21  ALKPHOS 108 90 97 94 91  BILITOT 0.3 0.2* 0.4 0.3 0.3  PROT 6.5 6.0* 6.5 6.2* 6.0*  ALBUMIN 3.2* 2.6* 3.1* 2.7* 2.7*   No results for input(s): LIPASE, AMYLASE in the last 168 hours. No results for input(s): AMMONIA in the last 168 hours. Coagulation Profile: No results for input(s): INR, PROTIME in the last 168 hours. Cardiac Enzymes: No results for input(s): CKTOTAL, CKMB, CKMBINDEX, TROPONINI in the last 168 hours. BNP (last 3 results) No results for input(s): PROBNP in the last 8760 hours. HbA1C: No results for input(s): HGBA1C in the last 72 hours. CBG: No results for input(s): GLUCAP in the last 168 hours. Lipid Profile: No results for input(s): CHOL, HDL, LDLCALC, TRIG, CHOLHDL, LDLDIRECT in the last 72 hours. Thyroid Function Tests: No results for input(s): TSH, T4TOTAL, FREET4, T3FREE, THYROIDAB in the last 72 hours. Anemia Panel: Recent Labs    08/15/19 0300 08/16/19 0311  FERRITIN 25 28   Sepsis Labs: Recent Labs  Lab 08/10/19 1759 08/10/19 2010 08/12/19 1136 08/13/19 0421 08/14/19 0337  PROCALCITON  0.34  --  0.10 <0.10 <0.10  LATICACIDVEN 1.3 0.8  --   --   --     Recent Results (from the past 240 hour(s))  Blood Culture (routine x 2)     Status: None   Collection Time: 08/10/19  5:59 PM   Specimen: BLOOD  Result Value Ref Range Status   Specimen Description   Final    BLOOD RIGHT ANTECUBITAL Performed at [facility], [address] [place],  [place] [address]    Special Requests   Final    BOTTLES DRAWN AEROBIC AND ANAEROBIC Blood Culture adequate volume Performed at [facility], [address] [place], [place] [address]    Culture   Final    NO GROWTH 5 DAYS Performed at [facility], [place] [address], [place], [place] [address]    Report Status 08/15/2019 FINAL  Final  SARS Coronavirus 2 Ag (30 min TAT) - Nasal Swab (BD Veritor Kit)     Status: None   Collection Time: 08/10/19  5:59 PM   Specimen: Nasal Swab (BD Veritor Kit)  Result Value Ref Range Status   SARS Coronavirus 2 Ag NEGATIVE NEGATIVE Final    Comment: (NOTE) SARS-CoV-2 antigen NOT DETECTED.  Negative results are presumptive.  Negative results do not preclude SARS-CoV-2 infection and should not be used as the sole basis for treatment or other patient management decisions, including infection  control decisions, particularly in the presence of clinical signs and  symptoms consistent with COVID-19, or in those who have been in contact with the virus.  Negative results must be combined with clinical observations, patient history, and epidemiological information. The expected result is Negative. Fact Sheet for Patients: PodPark.tn Fact Sheet for Healthcare Providers: GiftContent.is This test is not yet approved or cleared by the [place] FDA and  has been authorized for detection and/or diagnosis of SARS-CoV-2 by FDA under an Emergency Use Authorization (EUA).  This EUA will remain in effect (meaning this test can be  used) for the duration of  the COVID-19 de claration under Section 564(b)(1) of the Act, 21 U.S.C. section 360bbb-3(b)(1), unless the authorization is terminated or revoked sooner. Performed at [facility], [address] [place], [place] [address]   Blood Culture (routine x 2)     Status: None   Collection Time: 08/10/19  6:10 PM   Specimen: BLOOD  Result Value Ref Range Status   Specimen Description   Final    BLOOD LEFT ARM Performed at [facility], [address] [place], [place] [address]    Special Requests   Final    BOTTLES DRAWN AEROBIC AND ANAEROBIC Blood Culture adequate volume Performed at [facility], [address] [place], [place] [address]    Culture   Final    NO GROWTH 5 DAYS Performed at [facility], [place] [address], [place], [place] [address]    Report Status 08/15/2019 FINAL  Final  SARS CORONAVIRUS 2 (TAT 6-24 HRS) Nasopharyngeal Nasopharyngeal Swab     Status: None   Collection Time: 08/10/19  7:00 PM   Specimen: Nasopharyngeal Swab  Result Value Ref Range Status   SARS Coronavirus 2 NEGATIVE NEGATIVE Final    Comment: (NOTE) SARS-CoV-2 target nucleic acids are NOT DETECTED. The SARS-CoV-2 RNA is generally detectable in upper and lower respiratory specimens during the acute phase of infection. Negative results do not preclude SARS-CoV-2 infection, do not rule out co-infections with other pathogens, and should not be used as the sole basis for treatment or other patient management decisions. Negative results must be combined with clinical observations, patient history, and epidemiological information. The expected result is Negative. Fact Sheet for Patients: SugarRoll.be Fact Sheet for Healthcare Providers: https://www.woods-mathews.com/ This test is not yet approved or cleared by the [place] FDA and  has been authorized for detection and/or diagnosis of  SARS-CoV-2 by FDA under an Emergency Use Authorization (  EUA). This EUA will remain  in effect (meaning this test can be used) for the duration of the COVID-19 declaration under Section 56 4(b)(1) of the Act, 21 U.S.C. section 360bbb-3(b)(1), unless the authorization is terminated or revoked sooner. Performed at [facility], [place] [address] [place], [place] [address]   MRSA PCR Screening     Status: None   Collection Time: 08/11/19 10:51 AM   Specimen: Nasal Mucosa; Nasopharyngeal  Result Value Ref Range Status   MRSA by PCR NEGATIVE NEGATIVE Final    Comment:        The GeneXpert MRSA Assay (FDA approved for NASAL specimens only), is one component of a comprehensive MRSA colonization surveillance program. It is not intended to diagnose MRSA infection nor to guide or monitor treatment for MRSA infections. Performed at [facility], [place] [address], [place], [place] [address]   Respiratory Panel by PCR     Status: None   Collection Time: 08/11/19  5:08 PM  Result Value Ref Range Status   Adenovirus NOT DETECTED NOT DETECTED Final   Coronavirus 229E NOT DETECTED NOT DETECTED Final    Comment: (NOTE) The Coronavirus on the Respiratory Panel, DOES NOT test for the novel  Coronavirus (2019 nCoV)    Coronavirus HKU1 NOT DETECTED NOT DETECTED Final   Coronavirus NL63 NOT DETECTED NOT DETECTED Final   Coronavirus OC43 NOT DETECTED NOT DETECTED Final   Metapneumovirus NOT DETECTED NOT DETECTED Final   Rhinovirus / Enterovirus NOT DETECTED NOT DETECTED Final   Influenza A NOT DETECTED NOT DETECTED Final   Influenza B NOT DETECTED NOT DETECTED Final   Parainfluenza Virus 1 NOT DETECTED NOT DETECTED Final   Parainfluenza Virus 2 NOT DETECTED NOT DETECTED Final   Parainfluenza Virus 3 NOT DETECTED NOT DETECTED Final   Parainfluenza Virus 4 NOT DETECTED NOT DETECTED Final   Respiratory Syncytial Virus NOT DETECTED NOT DETECTED Final   Bordetella pertussis NOT  DETECTED NOT DETECTED Final   Chlamydophila pneumoniae NOT DETECTED NOT DETECTED Final   Mycoplasma pneumoniae NOT DETECTED NOT DETECTED Final    Comment: Performed at [facility], [place]. [address], [place], [place] [address]  Culture, Urine     Status: None   Collection Time: 08/12/19  9:01 AM   Specimen: Urine, Clean Catch  Result Value Ref Range Status   Specimen Description   Final    URINE, CLEAN CATCH Performed at [facility], [place] [address] [place], [place] [address]    Special Requests   Final    NONE Performed at [facility], [place] [address], [place], [place] [address]    Culture   Final    NO GROWTH Performed at [facility], [place] [address] [place],  [address]    Report Status 08/13/2019 FINAL  Final  SARS CORONAVIRUS 2 (TAT 6-24 HRS) Nasopharyngeal Nasopharyngeal Swab     Status: None   Collection Time: 08/12/19  2:16 PM   Specimen: Nasopharyngeal Swab  Result Value Ref Range Status   SARS Coronavirus 2 NEGATIVE NEGATIVE Final    Comment: (NOTE) SARS-CoV-2 target nucleic acids are NOT DETECTED. The SARS-CoV-2 RNA is generally detectable in upper and lower respiratory specimens during the acute phase of infection. Negative results do not preclude SARS-CoV-2 infection, do not rule out co-infections with other pathogens, and should not be used as the sole basis for treatment or other patient management decisions. Negative results must be combined with clinical  observations, patient history, and epidemiological information. The expected result is Negative. Fact Sheet for Patients: SugarRoll.be Fact Sheet for Healthcare Providers: https://www.woods-mathews.com/ This test is not yet approved or cleared by the [place] FDA and  has been authorized for detection and/or diagnosis of SARS-CoV-2 by FDA under an Emergency Use Authorization (EUA). This EUA will remain  in  effect (meaning this test can be used) for the duration of the COVID-19 declaration under Section 56 4(b)(1) of the Act, 21 U.S.C. section 360bbb-3(b)(1), unless the authorization is terminated or revoked sooner. Performed at [facility], [place] [address], [place], [place] [address]          Radiology Studies: No results found.      Scheduled Meds:  aspirin  325 mg Oral Daily   atorvastatin  40 mg Oral Daily   Buprenorphine HCl  1 Film Oral BID   cefdinir  300 mg Oral Q12H   enoxaparin (LOVENOX) injection  40 mg Subcutaneous Q24H   escitalopram  20 mg Oral Daily   feeding supplement (ENSURE ENLIVE)  237 mL Oral BID BM   furosemide  20 mg Oral Daily   hydrochlorothiazide  12.5 mg Oral Daily   losartan  100 mg Oral Daily   methylPREDNISolone (SOLU-MEDROL) injection  60 mg Intravenous Daily   potassium chloride SA  40 mEq Oral Daily   pregabalin  150 mg Oral TID   rifampin  300 mg Oral Q12H   traZODone  50 mg Oral QHS   Continuous Infusions:  remdesivir 100 mg in NS 100 mL 100 mg (08/16/19 0940)     LOS: 5 days    Time spent: 40 minutes    Irine Seal, MD [facility]  If 7PM-7AM, please contact night-coverage [email or web address] 08/16/2019, 12:39 PM

## 2019-08-17 DIAGNOSIS — Z20822 Contact with and (suspected) exposure to covid-19: Secondary | ICD-10-CM

## 2019-08-17 DIAGNOSIS — Z20828 Contact with and (suspected) exposure to other viral communicable diseases: Secondary | ICD-10-CM

## 2019-08-17 DIAGNOSIS — T8452XD Infection and inflammatory reaction due to internal left hip prosthesis, subsequent encounter: Secondary | ICD-10-CM

## 2019-08-17 LAB — COMPREHENSIVE METABOLIC PANEL
ALT: 21 U/L (ref 0–44)
AST: 13 U/L — ABNORMAL LOW (ref 15–41)
Albumin: 2.9 g/dL — ABNORMAL LOW (ref 3.5–5.0)
Alkaline Phosphatase: 86 U/L (ref 38–126)
Anion gap: 9 (ref 5–15)
BUN: 18 mg/dL (ref 6–20)
CO2: 31 mmol/L (ref 22–32)
Calcium: 8.1 mg/dL — ABNORMAL LOW (ref 8.9–10.3)
Chloride: 99 mmol/L (ref 98–111)
Creatinine, Ser: 0.47 mg/dL (ref 0.44–1.00)
GFR calc Af Amer: >60 mL/min (ref >60)
GFR calc non Af Amer: >60 mL/min (ref >60)
Glucose, Bld: 98 mg/dL (ref 70–99)
Potassium: 4.3 mmol/L (ref 3.5–5.1)
Sodium: 139 mmol/L (ref 135–145)
Total Bilirubin: 0.5 mg/dL (ref 0.3–1.2)
Total Protein: 6 g/dL — ABNORMAL LOW (ref 6.5–8.1)

## 2019-08-17 LAB — CBC WITH DIFFERENTIAL/PLATELET
Abs Immature Granulocytes: 1.13 10*3/uL — ABNORMAL HIGH (ref 0.00–0.07)
Basophils Absolute: 0.1 10*3/uL (ref 0.0–0.1)
Basophils Relative: 1 %
Eosinophils Absolute: 0.2 10*3/uL (ref 0.0–0.5)
Eosinophils Relative: 1 %
HCT: 34.9 % — ABNORMAL LOW (ref 36.0–46.0)
Hemoglobin: 10.4 g/dL — ABNORMAL LOW (ref 12.0–15.0)
Immature Granulocytes: 8 %
Lymphocytes Relative: 33 %
Lymphs Abs: 4.8 10*3/uL — ABNORMAL HIGH (ref 0.7–4.0)
MCH: 23.9 pg — ABNORMAL LOW (ref 26.0–34.0)
MCHC: 29.8 g/dL — ABNORMAL LOW (ref 30.0–36.0)
MCV: 80 fL (ref 80.0–100.0)
Monocytes Absolute: 0.6 10*3/uL (ref 0.1–1.0)
Monocytes Relative: 4 %
Neutro Abs: 7.9 10*3/uL — ABNORMAL HIGH (ref 1.7–7.7)
Neutrophils Relative %: 53 %
Platelets: 519 10*3/uL — ABNORMAL HIGH (ref 150–400)
RBC: 4.36 MIL/uL (ref 3.87–5.11)
RDW: 16.7 % — ABNORMAL HIGH (ref 11.5–15.5)
WBC: 14.8 10*3/uL — ABNORMAL HIGH (ref 4.0–10.5)
nRBC: 0 % (ref 0.0–0.2)

## 2019-08-17 LAB — C-REACTIVE PROTEIN: CRP: 0.7 mg/dL (ref <1.0)

## 2019-08-17 LAB — D-DIMER, QUANTITATIVE: D-Dimer, Quant: 2.61 ug/mL-FEU — ABNORMAL HIGH (ref 0.00–0.50)

## 2019-08-17 LAB — FERRITIN: Ferritin: 23 ng/mL (ref 11–307)

## 2019-08-17 MED ORDER — ALBUTEROL SULFATE HFA 108 (90 BASE) MCG/ACT IN AERS: 1.0000 | INHALATION_SPRAY | RESPIRATORY_TRACT | 0 refills | Status: DC | PRN

## 2019-08-17 MED ORDER — PREDNISONE 20 MG PO TABS: 20.0000 mg | ORAL_TABLET | Freq: Every day | ORAL | 0 refills | Status: DC

## 2019-08-17 MED ORDER — RIFAMPIN 300 MG PO CAPS: 300.0000 mg | ORAL_CAPSULE | Freq: Two times a day (BID) | ORAL | Status: DC

## 2019-08-17 NOTE — Discharge Summary (Signed)
Physician Discharge Summary  [name] [name] [name] YPP:[id number] DOB: 08-May-1971 DOA: 08/10/2019  PCP: [name] Peers, MD  Admit date: 08/10/2019 Discharge date: 08/17/2019  Time spent: 60 minutes  Recommendations for Outpatient Follow-up:  1. Follow-up with [name] Peers, MD in 3 weeks.   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 female with medical history significant of HTN, CAD, Psoriatic arthritis, who presented with fevers, sob and cough.  Patient reported she was in [place] thru Saturday.  She reported she began having fevers up to 100-102F which responded to tylenol.  She reported on Sunday she continued to have fevers, with sob and cough. She had body aches with her fevers. She stated when she went to bed Sunday night it was more difficult to breathe and symptoms worsened yesterday and pulse oximetry at home was reading in the 60s prompting her to present to the ER. She reported she has no productive cough, no runny nose or congestion. She denied any recent sick contacts.    She has a history of stroke in 2013, no reported residual deficits.  She denies any tobacco, alcohol or drug use.  She lives at home with her husband and 4 children.  [name] [name] Course:  1 acute respiratory failure with hypoxia likely secondary to presumed COVID-19 virus infection Patient presented with sudden onset fevers  as high as 102, shortness of breath on minimal exertion, nonproductive cough, body aches with fevers.  Patient noted to have elevated D-dimer on admission in addition to CRP.  Procalcitonin was 0.10.  Ferritin noted at 45.  Chest x-ray done concerning for multifocal pneumonia.  Atypical/viral etiologies including Covid are possible.  CT angiogram chest was done which was negative for PE however did show interval development of diffuse bilateral airspace process likely multifocal pneumonia.  No effusion.  1.1 cm subcarinal lymph node likely reactive.  Bacterial viral origin is possible.  Point-of-care SARS COVID-19 antigen which was done on admission was negative.  SARS COVID-19 PCR done was negative on admission.  Patient however high clinical suspicion in addition to lab work, chest x-ray and CT findings is concerning for possible presumed COVID-19 virus infection.  BNP noted to be elevated at 283.1 with repeat BNP at 536.7.  Patient noted to have good urine output during the hospitalization.  Due to high clinical suspicion repeat SARS COVID-19 PCR was done which was negative.  Inflammatory markers were elevated during the hospitalization but and started trending down with treatment.  Patient was maintained on IV Solu-Medrol 60 mg every 12 hours during the hospitalization and subsequently transition to oral prednisone taper on discharge.  Patient was also given a couple of doses of IV Lasix 20 mg x 2 on 08/13/2019.  Patient back on home dose oral Lasix.  Although SARS COVID-19 PCR was negative x2 patient still with a high clinical suspicion.  Case discussed/curb sided with ID, Dr.  Hatcher and PCCM, Dr.Wert who were in agreement with treating patient as a PUI.  IV remdesivir was subsequently reordered and patient completed a full course of treatment during the hospitalization.  Patient initially placed on IV Rocephin and IV azithromycin and subsequently transition to oral Levaquin and then to oral Omnicef and  completed a course of antibiotic treatment.  2D echo with normal EF with no wall motion abnormalities.  Patient was initially very hesitant to be placed on IV remdesivir as she feels her SARS COVID-19 PCR was negative x2.  Patient subsequently agreed and received treatment which she completed during the hospitalization.  Patient's hypoxia improved and patient was satting 100% on room air by day of discharge.  Patient be discharged home on a prednisone taper and is to follow-up with PCP in the outpatient setting.  2.  Multifocal pneumonia See problem #1.  Urine strep pneumococcus antigen negative.  Urine Legionella antigen negative.  Procalcitonin levels  < 0.10.  Initial SARS COVID-19 PCR and antigen were negative.  However high clinical suspicion for COVID-19 virus infection as such SARS COVID-19 PCR was repeated which was negative.  Patient was on IV Rocephin and IV azithromycin and subsequently transition to oral Levaquin.  It was noted that patient was on rifampin and Keflex prior to admission secondary to recent history of periprosthetic hip infection.  Levaquin was subsequently changed to oral Omnicef and patient completed course of antibiotic treatment during the hospitalization.  Patient's home regimen Keflex to be resumed on discharge.  Outpatient follow-up with PCP.   3.  Hypertension Blood pressure noted to be borderline and slightly hypotensive on admission.  Patient's antihypertensive medications were held initially and subsequently resumed.  Blood pressure remained stable throughout the hospitalization.  Outpatient follow-up.  4.  Iron deficiency anemia Patient with no overt bleeding.  Hemoglobin stabilized at 10.4 by day of discharge.  Anemia panel consistent with severe iron deficiency anemia with iron level of 10, ferritin of 45, folate of 6.8, vitamin B12 of 300.  Outpatient follow-up with PCP.    5.  Hyperlipidemia Patient maintained on statin.   6.  Anxiety/depression Patient  was maintained on home regimen Lexapro and trazodone.  Outpatient follow-up.  7.  History of recent left hip prosthetic joint infection status post revision of femoral head and linear exchange 05/19/2019 Noted on care everywhere.  Patient following with ID in the outpatient setting and supposed to be on Keflex and rifampin until 08/19/2019.  Rifampin was resumed 08/14/2019.  Discontinued Levaquin and placed on Omnicef instead due to concerns for QT prolongation with interaction with rifampin and quinolones.  Patient noted to be refusing rifampin.  Expressed to patient importance of taking rifampin.  Patient resumed back on home regimen of Keflex on discharge. Outpatient follow-up with her ID doctor.   Procedures:  Lower extremity Dopplers 08/11/2019  CT angio chest 08/12/2019  Chest x-ray 08/11/2019   Consultations:  Curb sided ID, Dr. Johnnye Sima 08/13/2019  Curb sided PCCM, Dr. Melvyn Novas 08/13/2019   Discharge Exam: Vitals:   08/17/19 0348 08/17/19 0911  BP: 103/76 123/80  Pulse: (!) 59 69  Resp: 16   Temp: 99 F (37.2 C)   SpO2: 100% 100%    General: NAD Cardiovascular: RRR Respiratory: CTAB  Discharge Instructions   Discharge Instructions    Diet general   Complete by: As directed    Discharge instructions   Complete by: As directed    ?   Person Under Monitoring Name: [name] [name] [name]  Location: [place] [address]   Infection Prevention Recommendations for Individuals Confirmed to have, or Being Evaluated for, 2019 Novel Coronavirus (COVID-19) Infection Who Receive Care at Home  Individuals who are confirmed to have, or are being evaluated for, COVID-19 should follow the prevention steps below until a healthcare provider or local or state health department says they can return to normal activities.  Stay home except to get medical care You should restrict activities outside your home, except for getting medical care. Do not go  to work, school, or public areas, and do not use public transportation or taxis.  Call ahead before visiting your doctor Before your medical appointment, call the healthcare provider and tell them that you have, or are being evaluated for, COVID-19 infection. This will help the healthcare provider's office take steps to keep other people from getting infected. Ask your healthcare provider to call the local or state health department.  Monitor your symptoms Seek prompt medical attention if your illness is worsening (e.g., difficulty breathing). Before going to your medical appointment, call the healthcare provider and tell them that you have, or are being evaluated for, COVID-19 infection. Ask your healthcare provider to call the local or state health department.  Wear a facemask You should wear a facemask that covers your nose and mouth when you are in the same room with other people and when you visit a healthcare provider. People who live with or visit you should also wear a facemask while they are in the same room with you.  Separate yourself from other people in your home As much as possible, you should stay in a different room from other people in your home. Also, you should use a separate bathroom, if available.  Avoid sharing household items You should not share dishes, drinking glasses, cups, eating utensils, towels, bedding, or other items with other people in your home. After using these items, you should wash them thoroughly with soap and water.  Cover your coughs and sneezes Cover your mouth and nose with a tissue when you cough or sneeze, or you can cough or sneeze into your sleeve. Throw used tissues in a lined trash can, and immediately wash your hands with soap and water for at least 20 seconds or use an alcohol-based hand rub.  Wash your [facility] your hands often and thoroughly with soap and water for at least 20 seconds. You can use an alcohol-based hand sanitizer  if soap and water are not available and if your hands are not visibly dirty. Avoid touching your eyes, nose, and mouth with unwashed hands.   Prevention Steps for Caregivers and Household Members of Individuals Confirmed to have, or Being Evaluated for, COVID-19 Infection Being Cared for in the Home  If you live with, or provide care at home for, a person confirmed to have, or being evaluated for, COVID-19 infection please follow these guidelines to prevent infection:  Follow healthcare provider's instructions Make sure that you understand and can help the patient follow any healthcare provider instructions for all care.  Provide for the patient's basic needs You should help the patient with basic needs in the home and provide support for getting groceries, prescriptions, and other personal needs.  Monitor the patient's symptoms If they are getting sicker, call his or her medical provider and tell them that the patient has, or is being evaluated for, COVID-19 infection. This will help the healthcare provider's office take steps to keep other people from  getting infected. Ask the healthcare provider to call the local or state health department.  Limit the number of people who have contact with the patient If possible, have only one caregiver for the patient. Other household members should stay in another home or place of residence. If this is not possible, they should stay in another room, or be separated from the patient as much as possible. Use a separate bathroom, if available. Restrict visitors who do not have an essential need to be in the home.  Keep older adults, very young children, and other sick people away from the patient Keep older adults, very young children, and those who have compromised immune systems or chronic health conditions away from the patient. This includes people with chronic heart, lung, or kidney conditions, diabetes, and cancer.  Ensure good  ventilation Make sure that shared spaces in the home have good air flow, such as from an air conditioner or an opened window, weather permitting.  Wash your hands often Wash your hands often and thoroughly with soap and water for at least 20 seconds. You can use an alcohol based hand sanitizer if soap and water are not available and if your hands are not visibly dirty. Avoid touching your eyes, nose, and mouth with unwashed hands. Use disposable paper towels to dry your hands. If not available, use dedicated cloth towels and replace them when they become wet.  Wear a facemask and gloves Wear a disposable facemask at all times in the room and gloves when you touch or have contact with the patient's blood, body fluids, and/or secretions or excretions, such as sweat, saliva, sputum, nasal mucus, vomit, urine, or feces.  Ensure the mask fits over your nose and mouth tightly, and do not touch it during use. Throw out disposable facemasks and gloves after using them. Do not reuse. Wash your hands immediately after removing your facemask and gloves. If your personal clothing becomes contaminated, carefully remove clothing and launder. Wash your hands after handling contaminated clothing. Place all used disposable facemasks, gloves, and other waste in a lined container before disposing them with other household waste. Remove gloves and wash your hands immediately after handling these items.  Do not share dishes, glasses, or other household items with the patient Avoid sharing household items. You should not share dishes, drinking glasses, cups, eating utensils, towels, bedding, or other items with a patient who is confirmed to have, or being evaluated for, COVID-19 infection. After the person uses these items, you should wash them thoroughly with soap and water.  Wash laundry thoroughly Immediately remove and wash clothes or bedding that have blood, body fluids, and/or secretions or excretions, such as  sweat, saliva, sputum, nasal mucus, vomit, urine, or feces, on them. Wear gloves when handling laundry from the patient. Read and follow directions on labels of laundry or clothing items and detergent. In general, wash and dry with the warmest temperatures recommended on the label.  Clean all areas the individual has used often Clean all touchable surfaces, such as counters, tabletops, doorknobs, bathroom fixtures, toilets, phones, keyboards, tablets, and bedside tables, every day. Also, clean any surfaces that may have blood, body fluids, and/or secretions or excretions on them. Wear gloves when cleaning surfaces the patient has come in contact with. Use a diluted bleach solution (e.g., dilute bleach with 1 part bleach and 10 parts water) or a household disinfectant with a label that says EPA-registered for coronaviruses. To make a bleach solution at home, add 1 tablespoon of  bleach to 1 quart (4 cups) of water. For a larger supply, add  cup of bleach to 1 gallon (16 cups) of water. Read labels of cleaning products and follow recommendations provided on product labels. Labels contain instructions for safe and effective use of the cleaning product including precautions you should take when applying the product, such as wearing gloves or eye protection and making sure you have good ventilation during use of the product. Remove gloves and wash hands immediately after cleaning.  Monitor yourself for signs and symptoms of illness Caregivers and household members are considered close contacts, should monitor their health, and will be asked to limit movement outside of the home to the extent possible. Follow the monitoring steps for close contacts listed on the symptom monitoring form.   ? If you have additional questions, contact your local health department or call the epidemiologist on call at [phone] (available 24/7). ? This guidance is subject to change. For the most up-to-date guidance from  [facility], please refer to their website: YouBlogs.pl   Increase activity slowly   Complete by: As directed      Allergies as of 08/17/2019      Reactions   Nsaids Other (See Comments)   stroke CVA    Benzoin Dermatitis, Other (See Comments)   Localized - Skin Red with Burning Sensation Skin turns red and gets inflamed      Medication List    TAKE these medications   acetaminophen 650 MG CR tablet Commonly known as: TYLENOL Take 650 mg by mouth every 8 (eight) hours as needed for pain.   albuterol 108 (90 Base) MCG/ACT inhaler Commonly known as: VENTOLIN HFA Inhale 1-2 puffs into the lungs every 4 (four) hours as needed for wheezing or shortness of breath.   aspirin 325 MG tablet Take 325 mg by mouth daily.   atorvastatin 40 MG tablet Commonly known as: LIPITOR Take 40 mg by mouth daily.   Belbuca 450 MCG Film Generic drug: Buprenorphine HCl Take 1 strip by mouth 2 (two) times daily. 30 day supply   cephALEXin 500 MG capsule Commonly known as: KEFLEX Take 500 mg by mouth 3 (three) times daily. 90 day supply   escitalopram 20 MG tablet Commonly known as: LEXAPRO Take 20 mg by mouth daily.   furosemide 20 MG tablet Commonly known as: LASIX Take 20 mg by mouth daily.   hydrochlorothiazide 12.5 MG capsule Commonly known as: MICROZIDE Take 12.5 mg by mouth daily.   losartan 100 MG tablet Commonly known as: COZAAR Take 100 mg by mouth daily.   pantoprazole 40 MG tablet Commonly known as: PROTONIX Take 40 mg by mouth daily as needed (indigestion).   potassium chloride SA 20 MEQ tablet Commonly known as: KLOR-CON Take 40 mEq by mouth daily.   predniSONE 20 MG tablet Commonly known as: DELTASONE Take 1-3 tablets (20-60 mg total) by mouth daily before breakfast. Take 3 tablets (78m) daily x 3 days, then 2 tablets (442m daily x 3 days, then 1 tablet (2019mdaily x 3 days then stop. Start taking on:  August 18, 2019   pregabalin 75 MG capsule Commonly known as: LYRICA Take 150 mg by mouth 3 (three) times daily.   rifampin 300 MG capsule Commonly known as: RIFADIN Take 1 capsule (300 mg total) by mouth every 12 (twelve) hours.   traZODone 50 MG tablet Commonly known as: DESYREL Take 50 mg by mouth at bedtime.      Allergies  Allergen Reactions  .  Nsaids Other (See Comments)    stroke CVA   . Benzoin Dermatitis and Other (See Comments)    Localized - Skin Red with Burning Sensation Skin turns red and gets inflamed    Follow-up Information    [name] Peers, MD. Schedule an appointment as soon as possible for a visit in 3 week(s).   Specialty: Family Medicine Contact information: [address] [place] [address] [phone]            The results of significant diagnostics from this hospitalization (including imaging, microbiology, ancillary and laboratory) are listed below for reference.    Significant Diagnostic Studies: CT ANGIO CHEST PE W OR WO CONTRAST  Result Date: 08/12/2019 CLINICAL DATA:  Dyspnea on exertion and elevated D-dimer. Shortness of breath, fever and cough with hypoxia. EXAM: CT ANGIOGRAPHY CHEST WITH CONTRAST TECHNIQUE: Multidetector CT imaging of the chest was performed using the standard protocol during bolus administration of intravenous contrast. Multiplanar CT image reconstructions and MIPs were obtained to evaluate the vascular anatomy. CONTRAST:  186m OMNIPAQUE IOHEXOL 350 MG/ML SOLN COMPARISON:  08/03/2019 FINDINGS: Cardiovascular: Heart is normal size. No evidence of aortic dissection or aneurysm. Pulmonary arterial system is well opacified without evidence of emboli. Remaining vascular structures are unremarkable. Mediastinum/Nodes: 1.1 cm subcarinal lymph node likely reactive. No hilar adenopathy. Remaining mediastinal structures are unremarkable. Lungs/Pleura: Lungs are adequately inflated with interval development  of diffuse bilateral airspace process likely multifocal pneumonia. No evidence of effusion. Airways are normal. Upper Abdomen: No acute findings. Musculoskeletal: Degenerative changes of the spine as well as disc disease and reactive sclerosis over the thoracic spine unchanged. Review of the MIP images confirms the above findings. IMPRESSION: 1.  No evidence of pulmonary embolism. 2. Interval development of diffuse bilateral airspace process likely multifocal pneumonia. No effusion. 1.1 cm subcarinal lymph node likely reactive. Bacterial or viral origin is possible. Electronically Signed   By: DMarin OlpM.D.   On: 08/12/2019 10:58   DG CHEST PORT 1 VIEW  Result Date: 08/14/2019 CLINICAL DATA:  Dyspnea. EXAM: PORTABLE CHEST 1 VIEW COMPARISON:  08/10/2019 FINDINGS: Cardiac enlargement. No pleural effusion or edema. Within the periphery of the right lung base there is an airspace opacity compatible with residual pneumonia. Interval resolution of airspace disease within the right midlung, right upper lobe and left lung. IMPRESSION: 1. Persistent lateral right lung base airspace disease compatible with pneumonia. Remaining portions of the lungs are clear. Electronically Signed   By: TKerby MoorsM.D.   On: 08/14/2019 09:49   DG Chest Port 1 View  Result Date: 08/10/2019 CLINICAL DATA:  Shortness of breath EXAM: PORTABLE CHEST 1 VIEW COMPARISON:  08/03/2019 FINDINGS: Mild subpleural patchy opacities in the lungs bilaterally, worrisome for multifocal pneumonia. Relative sparing of the right upper lobe. No pleural effusions. No pneumothorax. The heart is normal in size. IMPRESSION: Multifocal pneumonia. Atypical/viral etiologies (including COVID) are possible. Electronically Signed   By: SJulian HyM.D.   On: 08/10/2019 18:45   ECHOCARDIOGRAM COMPLETE  Result Date: 08/14/2019   ECHOCARDIOGRAM REPORT   [facility]   [name] Medical Rec #:  [id number]           Height:       64.0 in Accession #:    [id number]         Weight:       198.1 lb Date of Birth:  [birth year]  BSA:          1.95 m Patient Age:    70 years            BP:           155/76 mmHg Patient Gender: F                   HR:           55 bpm. Exam Location:  Inpatient Procedure: 2D Echo Indications:    Dyspnea 786.09 / R06.00  History:        Patient has prior history of Echocardiogram examinations, most                 recent 01/22/2012. Risk Factors:Hypertension. COVID-19 virus                 infection: Presumed                 Acute respiratory failure with hypoxia.  Sonographer:    Vikki Ports Turrentine Referring Phys: [place]  1. Left ventricular ejection fraction, by visual estimation, is 55 to 60%. The left ventricle has normal function. There is no left ventricular hypertrophy.  2. Left ventricular diastolic parameters are indeterminate.  3. The left ventricle has no regional wall motion abnormalities.  4. Global right ventricle has normal systolic function.The right ventricular size is normal. No increase in right ventricular wall thickness.  5. Left atrial size was moderately dilated.  6. Right atrial size was mildly dilated.  7. The mitral valve is normal in structure. Mild mitral valve regurgitation.  8. The tricuspid valve is normal in structure. Tricuspid valve regurgitation is trivial.  9. The aortic valve is grossly normal. Aortic valve regurgitation is trivial. 10. The pulmonic valve was normal in structure. Pulmonic valve regurgitation is not visualized. 11. The atrial septum is grossly normal. FINDINGS  Left Ventricle: Left ventricular ejection fraction, by visual estimation, is 55 to 60%. The left ventricle has normal function. The left ventricle has no regional wall motion abnormalities. There is no left ventricular hypertrophy. Left ventricular diastolic parameters are indeterminate. Right Ventricle: The right ventricular size is normal. No increase in  right ventricular wall thickness. Global RV systolic function is has normal systolic function. Left Atrium: Left atrial size was moderately dilated. Right Atrium: Right atrial size was mildly dilated Pericardium: There is no evidence of pericardial effusion. Mitral Valve: The mitral valve is normal in structure. Mild mitral valve regurgitation. Tricuspid Valve: The tricuspid valve is normal in structure. Tricuspid valve regurgitation is trivial. Aortic Valve: The aortic valve is grossly normal.. There is mild thickening of the aortic valve. Aortic valve regurgitation is trivial. There is mild thickening of the aortic valve. Aortic valve mean gradient measures 8.0 mmHg. Aortic valve peak gradient  measures 14.9 mmHg. Aortic valve area, by VTI measures 1.99 cm. Pulmonic Valve: The pulmonic valve was normal in structure. Pulmonic valve regurgitation is not visualized. Pulmonic regurgitation is not visualized. Aorta: The aortic root and ascending aorta are structurally normal, with no evidence of dilitation. IAS/Shunts: The atrial septum is grossly normal.  LEFT VENTRICLE PLAX 2D LVIDd:         5.45 cm  Diastology LVIDs:         3.79 cm  LV e' lateral:   9.25 cm/s LV PW:         0.79 cm  LV E/e' lateral: 12.3 LV IVS:  0.90 cm  LV e' medial:    8.38 cm/s LVOT diam:     2.10 cm  LV E/e' medial:  13.6 LV SV:         83 ml LV SV Index:   40.35 LVOT Area:     3.46 cm  RIGHT VENTRICLE RV S prime:     10.40 cm/s TAPSE (M-mode): 2.8 cm LEFT ATRIUM             Index       RIGHT ATRIUM           Index LA diam:        4.80 cm 2.46 cm/m  RA Area:     20.60 cm LA Vol (A2C):   77.8 ml 39.93 ml/m RA Volume:   63.00 ml  32.33 ml/m LA Vol (A4C):   82.7 ml 42.44 ml/m LA Biplane Vol: 81.1 ml 41.62 ml/m  AORTIC VALVE AV Area (Vmax):    2.08 cm AV Area (Vmean):   2.04 cm AV Area (VTI):     1.99 cm AV Vmax:           193.00 cm/s AV Vmean:          132.000 cm/s AV VTI:            0.493 m AV Peak Grad:      14.9 mmHg AV Mean  Grad:      8.0 mmHg LVOT Vmax:         116.00 cm/s LVOT Vmean:        77.600 cm/s LVOT VTI:          0.283 m LVOT/AV VTI ratio: 0.57  AORTA Ao Root diam: 2.90 cm MITRAL VALVE MV Area (PHT): 3.48 cm              SHUNTS MV PHT:        63.22 msec            Systemic VTI:  0.28 m MV Decel Time: 218 msec              Systemic Diam: 2.10 cm MV E velocity: 114.00 cm/s 103 cm/s MV A velocity: 83.90 cm/s  70.3 cm/s MV E/A ratio:  1.36        1.5  Mertie Moores MD Electronically signed by Mertie Moores MD Signature Date/Time: 08/14/2019/3:26:32 PM    Final    VAS [place] LOWER EXTREMITY VENOUS (DVT)  Result Date: 08/11/2019  Lower Venous Study Indications: Elevated Ddimer.  Risk Factors: None identified. Comparison Study: No prior studies. Performing Technologist: Oliver Hum RVT  Examination Guidelines: A complete evaluation includes B-mode imaging, spectral Doppler, color Doppler, and power Doppler as needed of all accessible portions of each vessel. Bilateral testing is considered an integral part of a complete examination. Limited examinations for reoccurring indications may be performed as noted.  +---------+---------------+---------+-----------+----------+--------------+ RIGHT    CompressibilityPhasicitySpontaneityPropertiesThrombus Aging +---------+---------------+---------+-----------+----------+--------------+ CFV      Full           Yes      Yes                                 +---------+---------------+---------+-----------+----------+--------------+ SFJ      Full                                                        +---------+---------------+---------+-----------+----------+--------------+  FV Prox  Full                                                        +---------+---------------+---------+-----------+----------+--------------+ FV Mid   Full                                                        +---------+---------------+---------+-----------+----------+--------------+  FV DistalFull                                                        +---------+---------------+---------+-----------+----------+--------------+ PFV      Full                                                        +---------+---------------+---------+-----------+----------+--------------+ POP      Full           Yes      Yes                                 +---------+---------------+---------+-----------+----------+--------------+ PTV      Full                                                        +---------+---------------+---------+-----------+----------+--------------+ PERO     Full                                                        +---------+---------------+---------+-----------+----------+--------------+   +---------+---------------+---------+-----------+----------+--------------+ LEFT     CompressibilityPhasicitySpontaneityPropertiesThrombus Aging +---------+---------------+---------+-----------+----------+--------------+ CFV      Full           Yes      Yes                                 +---------+---------------+---------+-----------+----------+--------------+ SFJ      Full                                                        +---------+---------------+---------+-----------+----------+--------------+ FV Prox  Full                                                        +---------+---------------+---------+-----------+----------+--------------+  FV Mid   Full                                                        +---------+---------------+---------+-----------+----------+--------------+ FV DistalFull                                                        +---------+---------------+---------+-----------+----------+--------------+ PFV      Full                                                        +---------+---------------+---------+-----------+----------+--------------+ POP      Full           Yes      Yes                                  +---------+---------------+---------+-----------+----------+--------------+ PTV      Full                                                        +---------+---------------+---------+-----------+----------+--------------+ PERO     Full                                                        +---------+---------------+---------+-----------+----------+--------------+     Summary: Right: There is no evidence of deep vein thrombosis in the lower extremity. No cystic structure found in the popliteal fossa. Left: There is no evidence of deep vein thrombosis in the lower extremity. No cystic structure found in the popliteal fossa.  *See table(s) above for measurements and observations. Electronically signed by Harold Barban MD on 08/11/2019 at 5:22:14 PM.    Final     Microbiology: Recent Results (from the past 240 hour(s))  Blood Culture (routine x 2)     Status: None   Collection Time: 08/10/19  5:59 PM   Specimen: BLOOD  Result Value Ref Range Status   Specimen Description   Final    BLOOD RIGHT ANTECUBITAL Performed at [name], [address] [place], [place] [address]    Special Requests   Final    BOTTLES DRAWN AEROBIC AND ANAEROBIC Blood Culture adequate volume Performed at [name], [address] [place], [place] [address]    Culture   Final    NO GROWTH 5 DAYS Performed at [name], [place] [address] [place], [place] [address]    Report Status 08/15/2019 FINAL  Final  SARS Coronavirus 2 Ag (30 min TAT) - Nasal Swab (BD Veritor Kit)     Status: None   Collection Time: 08/10/19  5:59 PM   Specimen: Nasal Swab (BD Veritor Kit)  Result Value Ref Range Status   SARS Coronavirus 2 Ag NEGATIVE NEGATIVE Final    Comment: (NOTE) SARS-CoV-2 antigen NOT DETECTED.  Negative results are presumptive.  Negative results do not preclude SARS-CoV-2 infection and should not be used as the sole basis for treatment or other  patient management decisions, including infection  control decisions, particularly in the presence of clinical signs and  symptoms consistent with COVID-19, or in those who have been in contact with the virus.  Negative results must be combined with clinical observations, patient history, and epidemiological information. The expected result is Negative. Fact Sheet for Patients: PodPark.tn Fact Sheet for Healthcare Providers: GiftContent.is This test is not yet approved or cleared by the [place] FDA and  has been authorized for detection and/or diagnosis of SARS-CoV-2 by FDA under an Emergency Use Authorization (EUA).  This EUA will remain in effect (meaning this test can be used) for the duration of  the COVID-19 de claration under Section 564(b)(1) of the Act, 21 U.S.C. section 360bbb-3(b)(1), unless the authorization is terminated or revoked sooner. Performed at [facility], [address] [place], [place] [address]   Blood Culture (routine x 2)     Status: None   Collection Time: 08/10/19  6:10 PM   Specimen: BLOOD  Result Value Ref Range Status   Specimen Description   Final    BLOOD LEFT ARM Performed at [name], [address] [place], [place] [address]    Special Requests   Final    BOTTLES DRAWN AEROBIC AND ANAEROBIC Blood Culture adequate volume Performed at [facility], [address] [place], [place] [address]    Culture   Final    NO GROWTH 5 DAYS Performed at [name], [place] [address] [place], [place] [address]    Report Status 08/15/2019 FINAL  Final  SARS CORONAVIRUS 2 (TAT 6-24 HRS) Nasopharyngeal Nasopharyngeal Swab     Status: None   Collection Time: 08/10/19  7:00 PM   Specimen: Nasopharyngeal Swab  Result Value Ref Range Status   SARS Coronavirus 2 NEGATIVE NEGATIVE Final    Comment: (NOTE) SARS-CoV-2 target nucleic acids are NOT  DETECTED. The SARS-CoV-2 RNA is generally detectable in upper and lower respiratory specimens during the acute phase of infection. Negative results do not preclude SARS-CoV-2 infection, do not rule out co-infections with other pathogens, and should not be used as the sole basis for treatment or other patient management decisions. Negative results must be combined with clinical observations, patient history, and epidemiological information. The expected result is Negative. Fact Sheet for Patients: SugarRoll.be Fact Sheet for Healthcare Providers: https://www.woods-mathews.com/ This test is not yet approved or cleared by the [place] FDA and  has been authorized for detection and/or diagnosis of SARS-CoV-2 by FDA under an Emergency Use Authorization (EUA). This EUA will remain  in effect (meaning this test can be used) for the duration of the COVID-19 declaration under Section 56 4(b)(1) of the Act, 21 U.S.C. section 360bbb-3(b)(1), unless the authorization is terminated or revoked sooner. Performed at [name], [place] [address] [place] [address] [place], [place] [address]   MRSA PCR Screening     Status: None   Collection Time: 08/11/19 10:51 AM   Specimen: Nasal Mucosa; Nasopharyngeal  Result Value Ref Range Status   MRSA by PCR NEGATIVE NEGATIVE Final    Comment:  The GeneXpert MRSA Assay (FDA approved for NASAL specimens only), is one component of a comprehensive MRSA colonization surveillance program. It is not intended to diagnose MRSA infection nor to guide or monitor treatment for MRSA infections. Performed at [facility], [place] [address], [place], [place] [address]   Respiratory Panel by PCR     Status: None   Collection Time: 08/11/19  5:08 PM  Result Value Ref Range Status   Adenovirus NOT DETECTED NOT DETECTED Final   Coronavirus 229E NOT DETECTED NOT DETECTED Final    Comment: (NOTE) The Coronavirus  on the Respiratory Panel, DOES NOT test for the novel  Coronavirus (2019 nCoV)    Coronavirus HKU1 NOT DETECTED NOT DETECTED Final   Coronavirus NL63 NOT DETECTED NOT DETECTED Final   Coronavirus OC43 NOT DETECTED NOT DETECTED Final   Metapneumovirus NOT DETECTED NOT DETECTED Final   Rhinovirus / Enterovirus NOT DETECTED NOT DETECTED Final   Influenza A NOT DETECTED NOT DETECTED Final   Influenza B NOT DETECTED NOT DETECTED Final   Parainfluenza Virus 1 NOT DETECTED NOT DETECTED Final   Parainfluenza Virus 2 NOT DETECTED NOT DETECTED Final   Parainfluenza Virus 3 NOT DETECTED NOT DETECTED Final   Parainfluenza Virus 4 NOT DETECTED NOT DETECTED Final   Respiratory Syncytial Virus NOT DETECTED NOT DETECTED Final   Bordetella pertussis NOT DETECTED NOT DETECTED Final   Chlamydophila pneumoniae NOT DETECTED NOT DETECTED Final   Mycoplasma pneumoniae NOT DETECTED NOT DETECTED Final    Comment: Performed at [name], [place]. [address] [place], [place] [address]  Culture, Urine     Status: None   Collection Time: 08/12/19  9:01 AM   Specimen: Urine, Clean Catch  Result Value Ref Range Status   Specimen Description   Final    URINE, CLEAN CATCH Performed at [facility], [place] [address] [place], [place] [address]    Special Requests   Final    NONE Performed at [name], [place] [address], [place], [place] [address]    Culture   Final    NO GROWTH Performed at [name], [place] [address], [place], [place] [address]    Report Status 08/13/2019 FINAL  Final  SARS CORONAVIRUS 2 (TAT 6-24 HRS) Nasopharyngeal Nasopharyngeal Swab     Status: None   Collection Time: 08/12/19  2:16 PM   Specimen: Nasopharyngeal Swab  Result Value Ref Range Status   SARS Coronavirus 2 NEGATIVE NEGATIVE Final    Comment: (NOTE) SARS-CoV-2 target nucleic acids are NOT DETECTED. The SARS-CoV-2 RNA is generally detectable in upper and lower respiratory  specimens during the acute phase of infection. Negative results do not preclude SARS-CoV-2 infection, do not rule out co-infections with other pathogens, and should not be used as the sole basis for treatment or other patient management decisions. Negative results must be combined with clinical observations, patient history, and epidemiological information. The expected result is Negative. Fact Sheet for Patients: SugarRoll.be Fact Sheet for Healthcare Providers: https://www.woods-mathews.com/ This test is not yet approved or cleared by the [place] FDA and  has been authorized for detection and/or diagnosis of SARS-CoV-2 by FDA under an Emergency Use Authorization (EUA). This EUA will remain  in effect (meaning this test can be used) for the duration of the COVID-19 declaration under Section 56 4(b)(1) of the Act, 21 U.S.C. section 360bbb-3(b)(1), unless the authorization is terminated or revoked sooner. Performed at [name], [place]  [address] [place], [place] [address]      Labs: Basic Metabolic Panel: Recent Labs  Lab 08/12/19 1136 08/13/19 0421 08/14/19 0337 08/15/19 0300 08/16/19 0311 08/17/19 0339  NA  --  137 136 137 136 139  K  --  4.6 4.8 4.6 4.9 4.3  CL  --  99 97* 97* 98 99  CO2  --  _0 GLUCOSE  --  135* 148* 154* 151* 98  BUN  --  _1 CREATININE  --  0.39* 0.39* 0.50 0.54 0.47  CALCIUM  --  8.2* 8.4* 8.3* 8.2* 8.1*  MG 2.4  --   --   --   --   --    Liver Function Tests: Recent Labs  Lab 08/13/19 0421 08/14/19 0337 08/15/19 0300 08/16/19 0311 08/17/19 0339  AST _2 13* 13*  ALT 20 32 _3 ALKPHOS 90 97 94 91 86  BILITOT 0.2* 0.4 0.3 0.3 0.5  PROT 6.0* 6.5 6.2* 6.0* 6.0*  ALBUMIN 2.6* 3.1* 2.7* 2.7* 2.9*   No results for input(s): LIPASE, AMYLASE in the last 168 hours. No results for input(s): AMMONIA in the last 168 hours. CBC: Recent Labs  Lab  08/13/19 0421 08/14/19 0337 08/15/19 0300 08/16/19 0311 08/17/19 0339  WBC 8.9 10.9* 12.2* 13.1* 14.8*  NEUTROABS 7.5 8.8* 9.5* 9.5* 7.9*  HGB 8.3* 9.2* 9.5* 10.4* 10.4*  HCT 28.8* 31.2* 31.3* 34.9* 34.9*  MCV 83.7 82.5 81.1 80.4 80.0  PLT 344 304 376 441* 519*   Cardiac Enzymes: No results for input(s): CKTOTAL, CKMB, CKMBINDEX, TROPONINI in the last 168 hours. BNP: BNP (last 3 results) Recent Labs    08/12/19 1330 08/13/19 0421 08/14/19 0337  BNP 283.1* 536.7* 394.6*    ProBNP (last 3 results) No results for input(s): PROBNP in the last 8760 hours.  CBG: No results for input(s): GLUCAP in the last 168 hours.     Signed:  Irine Seal MD.  [facility] 08/17/2019, 1:23 PM

## 2019-08-17 NOTE — Plan of Care (Signed)
  Problem: Activity: Goal: Ability to tolerate increased activity will improve Outcome: Completed/Met   Problem: Clinical Measurements: Goal: Ability to maintain a body temperature in the normal range will improve Outcome: Completed/Met   Problem: Respiratory: Goal: Ability to maintain adequate ventilation will improve Outcome: Completed/Met Goal: Ability to maintain a clear airway will improve Outcome: Completed/Met   Problem: Education: Goal: Knowledge of General Education information will improve Description: Including pain rating scale, medication(s)/side effects and non-pharmacologic comfort measures Outcome: Progressing   Problem: Health Behavior/Discharge Planning: Goal: Ability to manage health-related needs will improve Outcome: Completed/Met   Problem: Clinical Measurements: Goal: Ability to maintain clinical measurements within normal limits will improve Outcome: Completed/Met Goal: Will remain free from infection Outcome: Completed/Met Goal: Diagnostic test results will improve Outcome: Progressing Goal: Respiratory complications will improve Outcome: Completed/Met Goal: Cardiovascular complication will be avoided Outcome: Progressing   Problem: Activity: Goal: Risk for activity intolerance will decrease Outcome: Completed/Met   Problem: Clinical Measurements: Goal: Respiratory complications will improve Outcome: Completed/Met   Problem: Nutrition: Goal: Adequate nutrition will be maintained Outcome: Completed/Met   Problem: Coping: Goal: Level of anxiety will decrease Outcome: Progressing   Problem: Elimination: Goal: Will not experience complications related to bowel motility Outcome: Completed/Met Goal: Will not experience complications related to urinary retention Outcome: Completed/Met   Problem: Pain Managment: Goal: General experience of comfort will improve Outcome: Progressing   Problem: Safety: Goal: Ability to remain free from  injury will improve Outcome: Completed/Met   Problem: Skin Integrity: Goal: Risk for impaired skin integrity will decrease Outcome: Completed/Met

## 2019-08-18 ENCOUNTER — Telehealth: Payer: Self-pay | Admitting: Hematology and Oncology

## 2019-08-18 NOTE — Telephone Encounter (Signed)
Scheduled appt per 12/15 sch message - unable to reach pt  - left message for patient to call back for reschedule.

## 2019-08-26 ENCOUNTER — Telehealth: Payer: Self-pay | Admitting: Hematology and Oncology

## 2019-08-26 ENCOUNTER — Other Ambulatory Visit: Payer: Self-pay

## 2019-08-26 ENCOUNTER — Inpatient Hospital Stay: Payer: BC Managed Care – PPO | Attending: Hematology and Oncology

## 2019-08-26 ENCOUNTER — Inpatient Hospital Stay (HOSPITAL_BASED_OUTPATIENT_CLINIC_OR_DEPARTMENT_OTHER): Payer: BC Managed Care – PPO | Admitting: Hematology and Oncology

## 2019-08-26 VITALS — BP 131/89 | HR 90 | Temp 98.2°F | Resp 20 | Wt 198.2 lb

## 2019-08-26 DIAGNOSIS — D509 Iron deficiency anemia, unspecified: Secondary | ICD-10-CM | POA: Insufficient documentation

## 2019-08-26 DIAGNOSIS — Z79899 Other long term (current) drug therapy: Secondary | ICD-10-CM | POA: Insufficient documentation

## 2019-08-26 DIAGNOSIS — D508 Other iron deficiency anemias: Secondary | ICD-10-CM

## 2019-08-26 DIAGNOSIS — D500 Iron deficiency anemia secondary to blood loss (chronic): Secondary | ICD-10-CM

## 2019-08-26 DIAGNOSIS — Z7952 Long term (current) use of systemic steroids: Secondary | ICD-10-CM | POA: Diagnosis not present

## 2019-08-26 DIAGNOSIS — I2510 Atherosclerotic heart disease of native coronary artery without angina pectoris: Secondary | ICD-10-CM | POA: Insufficient documentation

## 2019-08-26 DIAGNOSIS — Z7982 Long term (current) use of aspirin: Secondary | ICD-10-CM | POA: Insufficient documentation

## 2019-08-26 DIAGNOSIS — L4050 Arthropathic psoriasis, unspecified: Secondary | ICD-10-CM | POA: Insufficient documentation

## 2019-08-26 DIAGNOSIS — I119 Hypertensive heart disease without heart failure: Secondary | ICD-10-CM | POA: Diagnosis not present

## 2019-08-26 DIAGNOSIS — Z8673 Personal history of transient ischemic attack (TIA), and cerebral infarction without residual deficits: Secondary | ICD-10-CM | POA: Diagnosis not present

## 2019-08-26 LAB — CBC WITH DIFFERENTIAL (CANCER CENTER ONLY)
Abs Immature Granulocytes: 0.13 10*3/uL — ABNORMAL HIGH (ref 0.00–0.07)
Basophils Absolute: 0.1 10*3/uL (ref 0.0–0.1)
Basophils Relative: 1 %
Eosinophils Absolute: 0.4 10*3/uL (ref 0.0–0.5)
Eosinophils Relative: 3 %
HCT: 34.4 % — ABNORMAL LOW (ref 36.0–46.0)
Hemoglobin: 10.4 g/dL — ABNORMAL LOW (ref 12.0–15.0)
Immature Granulocytes: 1 %
Lymphocytes Relative: 17 %
Lymphs Abs: 2.4 10*3/uL (ref 0.7–4.0)
MCH: 23.5 pg — ABNORMAL LOW (ref 26.0–34.0)
MCHC: 30.2 g/dL (ref 30.0–36.0)
MCV: 77.7 fL — ABNORMAL LOW (ref 80.0–100.0)
Monocytes Absolute: 0.6 10*3/uL (ref 0.1–1.0)
Monocytes Relative: 4 %
Neutro Abs: 11 10*3/uL — ABNORMAL HIGH (ref 1.7–7.7)
Neutrophils Relative %: 74 %
Platelet Count: 572 10*3/uL — ABNORMAL HIGH (ref 150–400)
RBC: 4.43 MIL/uL (ref 3.87–5.11)
RDW: 17.7 % — ABNORMAL HIGH (ref 11.5–15.5)
WBC Count: 14.7 10*3/uL — ABNORMAL HIGH (ref 4.0–10.5)
nRBC: 0 % (ref 0.0–0.2)

## 2019-08-26 LAB — CMP (CANCER CENTER ONLY)
ALT: 21 U/L (ref 0–44)
AST: 21 U/L (ref 15–41)
Albumin: 3.6 g/dL (ref 3.5–5.0)
Alkaline Phosphatase: 114 U/L (ref 38–126)
Anion gap: 11 (ref 5–15)
BUN: 15 mg/dL (ref 6–20)
CO2: 30 mmol/L (ref 22–32)
Calcium: 8.2 mg/dL — ABNORMAL LOW (ref 8.9–10.3)
Chloride: 97 mmol/L — ABNORMAL LOW (ref 98–111)
Creatinine: 0.65 mg/dL (ref 0.44–1.00)
GFR, Est AFR Am: >60 mL/min (ref >60)
GFR, Estimated: >60 mL/min (ref >60)
Glucose, Bld: 99 mg/dL (ref 70–99)
Potassium: 3.8 mmol/L (ref 3.5–5.1)
Sodium: 138 mmol/L (ref 135–145)
Total Bilirubin: 0.3 mg/dL (ref 0.3–1.2)
Total Protein: 6.8 g/dL (ref 6.5–8.1)

## 2019-08-26 LAB — IRON AND TIBC
Iron: 21 ug/dL — ABNORMAL LOW (ref 41–142)
Saturation Ratios: 5 % — ABNORMAL LOW (ref 21–57)
TIBC: 395 ug/dL (ref 236–444)
UIBC: 374 ug/dL (ref 120–384)

## 2019-08-26 LAB — FERRITIN: Ferritin: 20 ng/mL (ref 11–307)

## 2019-08-26 LAB — LACTATE DEHYDROGENASE: LDH: 266 U/L — ABNORMAL HIGH (ref 98–192)

## 2019-08-26 NOTE — Progress Notes (Signed)
[place] [phone]   [phone]  PROGRESS NOTE  Patient Care Team: Robyne Peers, MD as PCP - General (Family Medicine)  Hematological/Oncological History # Iron Deficiency Anemia 1) 11/03/2018: WBC 12.6 HB 9.8 plts 462 2) 01/15/2019: WBC 7 HB 10.2 plts 547 MCV 80, Sed rate 26 3) 01/23/2019-01/30/2019: Feraheme 510 mg IV D1 and D8 due to intolerance of PO iron 4) 03/18/2019: WBC 8.9 HB 13.5 plts 276. Ferritin 97. 5) 08/26/2019: Establish care with Dr. Lorenso Courier. WBC 14.7, Hgb 10.4, Plt 572. MCV 77.7. Iron 21, TIBC 395, Sat 5%, ferritin 20.   Interval History:  [name] [name] 48 y.o. female with medical history significant for iron deficiency anemia presents for a follow up visit. She was last seen by Dr. Walden Field on 03/18/2019 at which time her Hgb improved markedly with IV iron administer in May 2020.   In the interim since her last visit she was admitted from 08/10/2019 to 08/17/2019 due to acute hypoxic respiratory failure due to PNA, which was thought to be 2/2 to COVID (of note, COVID tested negative). She missed her last scheduled appointment due to this admission.   On exam today she notes that she has recovered well from her hospitalization and is currently breathing at her baseline level.  She reports that she is not able to take iron pills because of severe nausea, vomiting, diarrhea and cramping.  She does have psoriatic arthritis and actively has inflamed lesions on her knuckles on her hands bilaterally.  She does note that she has been more cold as of late.  She reports that she does have menstrual cycles that do occur fairly regularly at 28-day intervals.  These periods are now described as vertically heavy and reports that she change a tampon every time she urinates and that they are not entirely saturated.  She reports that she did undergo a hip placement in September 2020, but has not noticed any changes in her symptoms since that time.  On  further discussion she notes that she had been set up for colonoscopy but notes that she has been busy and therefore has never been able to have this performed.  She reports that she did just finished a steroid taper from her hospitalization.  She reports that she is a vegetarian for the most part and avoids beef, though does periodically eat chicken.  She has no other concerns or complaints today.  She reports that her energy level is at baseline and that she has not been having any issues with shortness of breath, chest pain, or overt signs of bleeding such as nosebleeds, dark stools, or bruising.  A full 10 point ROS is listed below.  MEDICAL HISTORY:  Past Medical History:  Diagnosis Date  . Anxiety   . Coronary artery disease   . Hypertension   . Iron deficiency anemia 01/15/2019  . 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 Psoriatic arthritis ([place])   . Stroke [facility])     SURGICAL HISTORY: Past Surgical History:  Procedure Laterality Date  . CARPAL TUNNEL RELEASE     B/L hand  . CHOLECYSTECTOMY    . HERNIA REPAIR    . KNEE ARTHROSCOPY Bilateral 08/2014   [facility] Dr. Len Childs  . TEE WITHOUT CARDIOVERSION  01/22/2012   Procedure: TRANSESOPHAGEAL ECHOCARDIOGRAM (TEE);  Surgeon: Lelon Perla, MD;  Location: [place] [facility];  Service: Cardiovascular;  Laterality: N/A;  SOCIAL HISTORY: Social History   Socioeconomic History  . Marital status: Married    Spouse name: Not on file  . Number of children: Not on file  . Years of education: Not on file  . Highest education level: Not on file  Occupational History  . Not on file  Tobacco Use  . Smoking status: Never Smoker  . Smokeless tobacco: Never Used  . Tobacco comment: quit 10 years ago, smoked intermittently for few years. Less than 10 yrs total.  Substance and Sexual Activity  . Alcohol use: Yes    Comment: occ- 1-2 drinks a week  . Drug use: No  .  Sexual activity: Not on file  Other Topics Concern  . Not on file  Social History Narrative   Lives in [place] with her husband.   Has 4 healthy kids.   Social Determinants of Health   Financial Resource Strain:   . Difficulty of Paying Living Expenses: Not on file  Food Insecurity:   . Worried About Charity fundraiser in the Last Year: Not on file  . Ran Out of Food in the Last Year: Not on file  Transportation Needs:   . Lack of Transportation (Medical): Not on file  . Lack of Transportation (Non-Medical): Not on file  Physical Activity:   . Days of Exercise per Week: Not on file  . Minutes of Exercise per Session: Not on file  Stress:   . Feeling of Stress : Not on file  Social Connections:   . Frequency of Communication with Friends and Family: Not on file  . Frequency of Social Gatherings with Friends and Family: Not on file  . Attends Religious Services: Not on file  . Active Member of Clubs or Organizations: Not on file  . Attends Archivist Meetings: Not on file  . Marital Status: Not on file  Intimate Partner Violence:   . Fear of Current or Ex-Partner: Not on file  . Emotionally Abused: Not on file  . Physically Abused: Not on file  . Sexually Abused: Not on file    FAMILY HISTORY: Family History  Problem Relation Age of Onset  . Hypertension Mother   . Hypertension Father     ALLERGIES:  is allergic to nsaids and benzoin.  MEDICATIONS:  Current Outpatient Medications  Medication Sig Dispense Refill  . acetaminophen (TYLENOL) 650 MG CR tablet Take 650 mg by mouth every 8 (eight) hours as needed for pain.    Marland Kitchen albuterol (VENTOLIN HFA) 108 (90 Base) MCG/ACT inhaler Inhale 1-2 puffs into the lungs every 4 (four) hours as needed for wheezing or shortness of breath. 18 g 0  . aspirin 325 MG tablet Take 325 mg by mouth daily.    Marland Kitchen atorvastatin (LIPITOR) 40 MG tablet Take 40 mg by mouth daily.    Marland Kitchen BELBUCA 450 MCG FILM Take 1 strip by mouth 2 (two)  times daily. 30 day supply    . cephALEXin (KEFLEX) 500 MG capsule Take 500 mg by mouth 3 (three) times daily. 90 day supply    . escitalopram (LEXAPRO) 20 MG tablet Take 20 mg by mouth daily.    . furosemide (LASIX) 20 MG tablet Take 20 mg by mouth daily.    . hydrochlorothiazide (MICROZIDE) 12.5 MG capsule Take 12.5 mg by mouth daily.    Marland Kitchen losartan (COZAAR) 100 MG tablet Take 100 mg by mouth daily.    . pantoprazole (PROTONIX) 40 MG tablet Take 40 mg by  mouth daily as needed (indigestion).     . potassium chloride SA (K-DUR) 20 MEQ tablet Take 40 mEq by mouth daily.     . predniSONE (DELTASONE) 20 MG tablet Take 1-3 tablets (20-60 mg total) by mouth daily before breakfast. Take 3 tablets (60mg ) daily x 3 days, then 2 tablets (40mg ) daily x 3 days, then 1 tablet (20mg ) daily x 3 days then stop. 18 tablet 0  . pregabalin (LYRICA) 75 MG capsule Take 150 mg by mouth 3 (three) times daily.     . rifampin (RIFADIN) 300 MG capsule Take 1 capsule (300 mg total) by mouth every 12 (twelve) hours.    . traZODone (DESYREL) 50 MG tablet Take 50 mg by mouth at bedtime.     No current facility-administered medications for this visit.    REVIEW OF SYSTEMS:   Constitutional: ( - ) fevers, ( - )  chills , ( - ) night sweats Eyes: ( - ) blurriness of vision, ( - ) double vision, ( - ) watery eyes Ears, nose, mouth, throat, and face: ( - ) mucositis, ( - ) sore throat Respiratory: ( - ) cough, ( - ) dyspnea, ( - ) wheezes Cardiovascular: ( - ) palpitation, ( - ) chest discomfort, ( - ) lower extremity swelling Gastrointestinal:  ( - ) nausea, ( - ) heartburn, ( - ) change in bowel habits Skin: ( - ) abnormal skin rashes Lymphatics: ( - ) new lymphadenopathy, ( - ) easy bruising Neurological: ( - ) numbness, ( - ) tingling, ( - ) new weaknesses Behavioral/Psych: ( - ) mood change, ( - ) new changes  All other systems were reviewed with the patient and are negative.  PHYSICAL EXAMINATION: ECOG PERFORMANCE  STATUS: 1 - Symptomatic but completely ambulatory  Vitals:   08/26/19 1121  BP: 131/89  Pulse: 90  Resp: 20  Temp: 98.2 F (36.8 C)  SpO2: 100%   Filed Weights   08/26/19 1121  Weight: 198 lb 3.2 oz (89.9 kg)    GENERAL: well appearing middle aged Caucasian female in NAD  SKIN: skin color, texture, turgor are normal, no rashes or significant lesions EYES: conjunctiva are pink and non-injected, sclera clear LUNGS: clear to auscultation and percussion with normal breathing effort HEART: regular rate & rhythm and no murmurs and no lower extremity edema ABDOMEN: soft, non-tender, non-distended, normal bowel sounds Musculoskeletal: no cyanosis of digits and no clubbing. Scabbing over the knuckles bilaterally.  PSYCH: alert & oriented x 3, fluent speech NEURO: no focal motor/sensory deficits  LABORATORY DATA:  I have reviewed the data as listed Lab Results  Component Value Date   WBC 14.7 (H) 08/26/2019   HGB 10.4 (L) 08/26/2019   HCT 34.4 (L) 08/26/2019   MCV 77.7 (L) 08/26/2019   PLT 572 (H) 08/26/2019   NEUTROABS 11.0 (H) 08/26/2019     RADIOGRAPHIC STUDIES: I have personally reviewed the radiological images as listed and agreed with the findings in the report: no evidence of PE.   CT ANGIO CHEST PE W OR WO CONTRAST  Result Date: 08/12/2019 CLINICAL DATA:  Dyspnea on exertion and elevated D-dimer. Shortness of breath, fever and cough with hypoxia. EXAM: CT ANGIOGRAPHY CHEST WITH CONTRAST TECHNIQUE: Multidetector CT imaging of the chest was performed using the standard protocol during bolus administration of intravenous contrast. Multiplanar CT image reconstructions and MIPs were obtained to evaluate the vascular anatomy. CONTRAST:  191mL OMNIPAQUE IOHEXOL 350 MG/ML SOLN COMPARISON:  08/03/2019 FINDINGS: Cardiovascular:  Heart is normal size. No evidence of aortic dissection or aneurysm. Pulmonary arterial system is well opacified without evidence of emboli. Remaining vascular  structures are unremarkable. Mediastinum/Nodes: 1.1 cm subcarinal lymph node likely reactive. No hilar adenopathy. Remaining mediastinal structures are unremarkable. Lungs/Pleura: Lungs are adequately inflated with interval development of diffuse bilateral airspace process likely multifocal pneumonia. No evidence of effusion. Airways are normal. Upper Abdomen: No acute findings. Musculoskeletal: Degenerative changes of the spine as well as disc disease and reactive sclerosis over the thoracic spine unchanged. Review of the MIP images confirms the above findings. IMPRESSION: 1.  No evidence of pulmonary embolism. 2. Interval development of diffuse bilateral airspace process likely multifocal pneumonia. No effusion. 1.1 cm subcarinal lymph node likely reactive. Bacterial or viral origin is possible. Electronically Signed   By: Marin Olp M.D.   On: 08/12/2019 10:58   DG CHEST PORT 1 VIEW  Result Date: 08/14/2019 CLINICAL DATA:  Dyspnea. EXAM: PORTABLE CHEST 1 VIEW COMPARISON:  08/10/2019 FINDINGS: Cardiac enlargement. No pleural effusion or edema. Within the periphery of the right lung base there is an airspace opacity compatible with residual pneumonia. Interval resolution of airspace disease within the right midlung, right upper lobe and left lung. IMPRESSION: 1. Persistent lateral right lung base airspace disease compatible with pneumonia. Remaining portions of the lungs are clear. Electronically Signed   By: Kerby Moors M.D.   On: 08/14/2019 09:49   DG Chest Port 1 View  Result Date: 08/10/2019 CLINICAL DATA:  Shortness of breath EXAM: PORTABLE CHEST 1 VIEW COMPARISON:  08/03/2019 FINDINGS: Mild subpleural patchy opacities in the lungs bilaterally, worrisome for multifocal pneumonia. Relative sparing of the right upper lobe. No pleural effusions. No pneumothorax. The heart is normal in size. IMPRESSION: Multifocal pneumonia. Atypical/viral etiologies (including COVID) are possible. Electronically  Signed   By: Julian Hy M.D.   On: 08/10/2019 18:45   ECHOCARDIOGRAM COMPLETE  Result Date: 08/14/2019   ECHOCARDIOGRAM REPORT   Patient Name:   [name] [name] [facility] Date of Exam: 08/14/2019 Medical Rec #:  [id number]           Height:       64.0 in Accession #:    [id number]          Weight:       198.1 lb Date of Birth:  December 28, 1970           BSA:          1.95 m Patient Age:    51 years            BP:           155/76 mmHg Patient Gender: F                   HR:           55 bpm. Exam Location:  Inpatient Procedure: 2D Echo Indications:    Dyspnea 786.09 / R06.00  History:        Patient has prior history of Echocardiogram examinations, most                 recent 01/22/2012. Risk Factors:Hypertension. COVID-19 virus                 infection: Presumed                 Acute respiratory failure with hypoxia.  Sonographer:    Vikki Ports Turrentine Referring Phys: [place]  1. Left ventricular ejection  fraction, by visual estimation, is 55 to 60%. The left ventricle has normal function. There is no left ventricular hypertrophy.  2. Left ventricular diastolic parameters are indeterminate.  3. The left ventricle has no regional wall motion abnormalities.  4. Global right ventricle has normal systolic function.The right ventricular size is normal. No increase in right ventricular wall thickness.  5. Left atrial size was moderately dilated.  6. Right atrial size was mildly dilated.  7. The mitral valve is normal in structure. Mild mitral valve regurgitation.  8. The tricuspid valve is normal in structure. Tricuspid valve regurgitation is trivial.  9. The aortic valve is grossly normal. Aortic valve regurgitation is trivial. 10. The pulmonic valve was normal in structure. Pulmonic valve regurgitation is not visualized. 11. The atrial septum is grossly normal. FINDINGS  Left Ventricle: Left ventricular ejection fraction, by visual estimation, is 55 to 60%. The left ventricle has normal  function. The left ventricle has no regional wall motion abnormalities. There is no left ventricular hypertrophy. Left ventricular diastolic parameters are indeterminate. Right Ventricle: The right ventricular size is normal. No increase in right ventricular wall thickness. Global RV systolic function is has normal systolic function. Left Atrium: Left atrial size was moderately dilated. Right Atrium: Right atrial size was mildly dilated Pericardium: There is no evidence of pericardial effusion. Mitral Valve: The mitral valve is normal in structure. Mild mitral valve regurgitation. Tricuspid Valve: The tricuspid valve is normal in structure. Tricuspid valve regurgitation is trivial. Aortic Valve: The aortic valve is grossly normal.. There is mild thickening of the aortic valve. Aortic valve regurgitation is trivial. There is mild thickening of the aortic valve. Aortic valve mean gradient measures 8.0 mmHg. Aortic valve peak gradient  measures 14.9 mmHg. Aortic valve area, by VTI measures 1.99 cm. Pulmonic Valve: The pulmonic valve was normal in structure. Pulmonic valve regurgitation is not visualized. Pulmonic regurgitation is not visualized. Aorta: The aortic root and ascending aorta are structurally normal, with no evidence of dilitation. IAS/Shunts: The atrial septum is grossly normal.  LEFT VENTRICLE PLAX 2D LVIDd:         5.45 cm  Diastology LVIDs:         3.79 cm  LV e' lateral:   9.25 cm/s LV PW:         0.79 cm  LV E/e' lateral: 12.3 LV IVS:        0.90 cm  LV e' medial:    8.38 cm/s LVOT diam:     2.10 cm  LV E/e' medial:  13.6 LV SV:         83 ml LV SV Index:   40.35 LVOT Area:     3.46 cm  RIGHT VENTRICLE RV S prime:     10.40 cm/s TAPSE (M-mode): 2.8 cm LEFT ATRIUM             Index       RIGHT ATRIUM           Index LA diam:        4.80 cm 2.46 cm/m  RA Area:     20.60 cm LA Vol (A2C):   77.8 ml 39.93 ml/m RA Volume:   63.00 ml  32.33 ml/m LA Vol (A4C):   82.7 ml 42.44 ml/m LA Biplane Vol: 81.1  ml 41.62 ml/m  AORTIC VALVE AV Area (Vmax):    2.08 cm AV Area (Vmean):   2.04 cm AV Area (VTI):     1.99 cm AV Vmax:  193.00 cm/s AV Vmean:          132.000 cm/s AV VTI:            0.493 m AV Peak Grad:      14.9 mmHg AV Mean Grad:      8.0 mmHg LVOT Vmax:         116.00 cm/s LVOT Vmean:        77.600 cm/s LVOT VTI:          0.283 m LVOT/AV VTI ratio: 0.57  AORTA Ao Root diam: 2.90 cm MITRAL VALVE MV Area (PHT): 3.48 cm              SHUNTS MV PHT:        63.22 msec            Systemic VTI:  0.28 m MV Decel Time: 218 msec              Systemic Diam: 2.10 cm MV E velocity: 114.00 cm/s 103 cm/s MV A velocity: 83.90 cm/s  70.3 cm/s MV E/A ratio:  1.36        1.5  Mertie Moores MD Electronically signed by Mertie Moores MD Signature Date/Time: 08/14/2019/3:26:32 PM    Final    VAS [place] LOWER EXTREMITY VENOUS (DVT)  Result Date: 08/11/2019  Lower Venous Study Indications: Elevated Ddimer.  Risk Factors: None identified. Comparison Study: No prior studies. Performing Technologist: Oliver Hum RVT  Examination Guidelines: A complete evaluation includes B-mode imaging, spectral Doppler, color Doppler, and power Doppler as needed of all accessible portions of each vessel. Bilateral testing is considered an integral part of a complete examination. Limited examinations for reoccurring indications may be performed as noted.  +---------+---------------+---------+-----------+----------+--------------+ RIGHT    CompressibilityPhasicitySpontaneityPropertiesThrombus Aging +---------+---------------+---------+-----------+----------+--------------+ CFV      Full           Yes      Yes                                 +---------+---------------+---------+-----------+----------+--------------+ SFJ      Full                                                        +---------+---------------+---------+-----------+----------+--------------+ FV Prox  Full                                                         +---------+---------------+---------+-----------+----------+--------------+ FV Mid   Full                                                        +---------+---------------+---------+-----------+----------+--------------+ FV DistalFull                                                        +---------+---------------+---------+-----------+----------+--------------+  PFV      Full                                                        +---------+---------------+---------+-----------+----------+--------------+ POP      Full           Yes      Yes                                 +---------+---------------+---------+-----------+----------+--------------+ PTV      Full                                                        +---------+---------------+---------+-----------+----------+--------------+ PERO     Full                                                        +---------+---------------+---------+-----------+----------+--------------+   +---------+---------------+---------+-----------+----------+--------------+ LEFT     CompressibilityPhasicitySpontaneityPropertiesThrombus Aging +---------+---------------+---------+-----------+----------+--------------+ CFV      Full           Yes      Yes                                 +---------+---------------+---------+-----------+----------+--------------+ SFJ      Full                                                        +---------+---------------+---------+-----------+----------+--------------+ FV Prox  Full                                                        +---------+---------------+---------+-----------+----------+--------------+ FV Mid   Full                                                        +---------+---------------+---------+-----------+----------+--------------+ FV DistalFull                                                         +---------+---------------+---------+-----------+----------+--------------+ PFV      Full                                                        +---------+---------------+---------+-----------+----------+--------------+   POP      Full           Yes      Yes                                 +---------+---------------+---------+-----------+----------+--------------+ PTV      Full                                                        +---------+---------------+---------+-----------+----------+--------------+ PERO     Full                                                        +---------+---------------+---------+-----------+----------+--------------+     Summary: Right: There is no evidence of deep vein thrombosis in the lower extremity. No cystic structure found in the popliteal fossa. Left: There is no evidence of deep vein thrombosis in the lower extremity. No cystic structure found in the popliteal fossa.  *See table(s) above for measurements and observations. Electronically signed by Harold Barban MD on 08/11/2019 at 5:22:14 PM.    Final     [place] 48 y.o. female with medical history significant for iron deficiency anemia presents for a follow up visit.  After discussion with the patient reviewed the labs her findings are most consistent with an iron deficiency anemia.  Unfortunately at this time it is not entirely clear what is the source of this iron deficiency.  She had fully replete stores in July of this year and and has trended downward to iron deficiency again.  This is quite concerning given that she is not having heavy menstrual cycles.  My concern would be that there is a potentially GI source for what we are seeing.  The patient has been scheduled for colonoscopy for however has declined to follow through with this.  Treatment of iron deficiency anemia is twofold #1 identify the source of the bleed and #2 replete the iron.  At this point time  we are only replete in the iron as we are unsure what the source is.  I have stressed to her the importance of GI evaluation and assuring that there is no source of blood loss in the gastrointestinal tract.  This patient is not able to tolerate p.o. iron therapy and therefore is appropriate for IV iron.  She has responded well to [facility] before in the past and therefore I think we can try this again starting in January 2021.  We will plan to see her back in approximate 4 to 6 weeks after last dose of iron to assure that her stores repleted and that her hemoglobin is improving.  #Iron Deficiency Anemia --will plan for administration of IV iron in Jan 2021 as she is again depleted of her iron stores and cannot tolerate PO iron therapy --strongly recommend GI evaluation for consideration of colonoscopy for ongoing iron deficiency and mild menstrual cycles with no other overt sources of bleeding --RTC 4-6 weeks after last dose of IV iron to assure increase in Hgb  Orders Placed This Encounter  Procedures  . Iron and  TIBC    Standing Status:   Future    Number of Occurrences:   1    Standing Expiration Date:   08/25/2020    All questions were answered. The patient knows to call the clinic with any problems, questions or concerns.  A total of more than 40 minutes were spent on this encounter and over half of that time was spent on counseling and coordination of care as outlined above.   Ledell Peoples, MD Department of Hematology/Oncology [place] [facility] Phone: [phone] Pager: [phone] Email: [email or web address]  08/27/2019 3:03 PM

## 2019-08-26 NOTE — Telephone Encounter (Signed)
Returned patient's phone call regarding scheduling an appointment, left a voicemail. 

## 2019-08-27 ENCOUNTER — Telehealth: Payer: Self-pay | Admitting: Hematology and Oncology

## 2019-08-27 NOTE — Telephone Encounter (Signed)
No los per 12/23.

## 2019-09-01 DIAGNOSIS — M2141 Flat foot [pes planus] (acquired), right foot: Secondary | ICD-10-CM | POA: Insufficient documentation

## 2019-09-01 DIAGNOSIS — T85848A Pain due to other internal prosthetic devices, implants and grafts, initial encounter: Secondary | ICD-10-CM | POA: Insufficient documentation

## 2019-09-01 DIAGNOSIS — M7751 Other enthesopathy of right foot: Secondary | ICD-10-CM | POA: Insufficient documentation

## 2019-09-11 ENCOUNTER — Telehealth: Payer: Self-pay | Admitting: Hematology and Oncology

## 2019-09-11 NOTE — Telephone Encounter (Signed)
Scheduled appt per 1/8 sch message - pt aware of appt date and time   

## 2019-09-15 ENCOUNTER — Other Ambulatory Visit: Payer: Self-pay

## 2019-09-15 ENCOUNTER — Inpatient Hospital Stay: Payer: BC Managed Care – PPO | Attending: Hematology and Oncology

## 2019-09-15 VITALS — BP 105/58 | HR 78 | Temp 98.4°F | Resp 18

## 2019-09-15 DIAGNOSIS — D508 Other iron deficiency anemias: Secondary | ICD-10-CM

## 2019-09-15 DIAGNOSIS — D509 Iron deficiency anemia, unspecified: Secondary | ICD-10-CM | POA: Diagnosis not present

## 2019-09-15 MED ORDER — METHYLPREDNISOLONE SODIUM SUCC 125 MG IJ SOLR
INTRAMUSCULAR | Status: AC
  Filled 2019-09-15: qty 2

## 2019-09-15 MED ORDER — SODIUM CHLORIDE 0.9 % IV SOLN
510.0000 mg | Freq: Once | INTRAVENOUS | Status: AC
  Administered 2019-09-15: 10:00:00 510 mg via INTRAVENOUS
  Filled 2019-09-15: qty 510

## 2019-09-15 MED ORDER — ACETAMINOPHEN 325 MG PO TABS: 650.0000 mg | ORAL_TABLET | Freq: Once | ORAL | Status: DC

## 2019-09-15 MED ORDER — METHYLPREDNISOLONE SODIUM SUCC 125 MG IJ SOLR: 125.0000 mg | Freq: Once | INTRAMUSCULAR | Status: DC

## 2019-09-15 MED ORDER — DIPHENHYDRAMINE HCL 25 MG PO CAPS: 50.0000 mg | ORAL_CAPSULE | Freq: Once | ORAL | Status: DC

## 2019-09-15 MED ORDER — SODIUM CHLORIDE 0.9 % IV SOLN
Freq: Once | INTRAVENOUS | Status: AC
  Filled 2019-09-15: qty 250

## 2019-09-15 MED ORDER — ACETAMINOPHEN 325 MG PO TABS
ORAL_TABLET | ORAL | Status: AC
  Filled 2019-09-15: qty 2

## 2019-09-15 MED ORDER — LORATADINE 10 MG PO TABS
ORAL_TABLET | ORAL | Status: AC
  Filled 2019-09-15: qty 1

## 2019-09-15 MED ORDER — FAMOTIDINE IN NACL 20-0.9 MG/50ML-% IV SOLN: 20.0000 mg | Freq: Once | INTRAVENOUS | Status: DC

## 2019-09-15 MED ORDER — DIPHENHYDRAMINE HCL 50 MG/ML IJ SOLN: 50.0000 mg | Freq: Once | INTRAMUSCULAR | Status: DC

## 2019-09-15 MED ORDER — LORATADINE 10 MG PO TABS: 10.0000 mg | ORAL_TABLET | Freq: Once | ORAL | Status: DC

## 2019-09-15 MED ORDER — FAMOTIDINE IN NACL 20-0.9 MG/50ML-% IV SOLN
INTRAVENOUS | Status: AC
  Filled 2019-09-15: qty 50

## 2019-09-22 ENCOUNTER — Inpatient Hospital Stay: Payer: BC Managed Care – PPO

## 2019-09-22 ENCOUNTER — Telehealth: Payer: Self-pay

## 2019-09-22 NOTE — Telephone Encounter (Signed)
Called Mrs. Cobleigh and left her a message that she missed her 845 iron infusion.  Advised her to call back to reschedule her appointment.  Gardiner Rhyme, RN

## 2019-09-23 ENCOUNTER — Telehealth: Payer: Self-pay | Admitting: Hematology and Oncology

## 2019-09-23 NOTE — Telephone Encounter (Signed)
Called pt per 1/19 sch message - no answer. Left message for patient to call back to reschedule missed appt

## 2019-09-25 ENCOUNTER — Telehealth: Payer: Self-pay | Admitting: *Deleted

## 2019-09-25 ENCOUNTER — Telehealth: Payer: Self-pay | Admitting: Hematology and Oncology

## 2019-09-25 NOTE — Telephone Encounter (Signed)
Received call from patient requesting to schedule her 2nd Fereheme infusion.  Pt had not shown up for previously scheduled infusion. Scheduling message sent. Pt voiced understanding to expect call from scheduler.

## 2019-09-25 NOTE — Telephone Encounter (Signed)
Returned patient's phone call regarding rescheduling 01/19 appointment, per patient's request appointment has been rescheduled to 01/29.

## 2019-10-02 ENCOUNTER — Other Ambulatory Visit: Payer: Self-pay

## 2019-10-02 ENCOUNTER — Inpatient Hospital Stay: Payer: BC Managed Care – PPO

## 2019-10-02 VITALS — BP 125/84 | HR 87 | Temp 98.5°F | Resp 20

## 2019-10-02 DIAGNOSIS — D509 Iron deficiency anemia, unspecified: Secondary | ICD-10-CM | POA: Diagnosis not present

## 2019-10-02 DIAGNOSIS — D508 Other iron deficiency anemias: Secondary | ICD-10-CM

## 2019-10-02 MED ORDER — SODIUM CHLORIDE 0.9 % IV SOLN
Freq: Once | INTRAVENOUS | Status: AC
  Filled 2019-10-02: qty 250

## 2019-10-02 MED ORDER — SODIUM CHLORIDE 0.9 % IV SOLN
510.0000 mg | Freq: Once | INTRAVENOUS | Status: AC
  Administered 2019-10-02: 510 mg via INTRAVENOUS
  Filled 2019-10-02: qty 510

## 2019-10-22 ENCOUNTER — Ambulatory Visit: Payer: BC Managed Care – PPO | Admitting: Gastroenterology

## 2019-10-23 ENCOUNTER — Ambulatory Visit (AMBULATORY_SURGERY_CENTER): Payer: BC Managed Care – PPO | Admitting: *Deleted

## 2019-10-23 ENCOUNTER — Other Ambulatory Visit: Payer: Self-pay

## 2019-10-23 VITALS — Temp 96.6°F | Ht 64.0 in | Wt 208.0 lb

## 2019-10-23 DIAGNOSIS — Z01818 Encounter for other preprocedural examination: Secondary | ICD-10-CM

## 2019-10-23 DIAGNOSIS — D509 Iron deficiency anemia, unspecified: Secondary | ICD-10-CM

## 2019-10-23 MED ORDER — METOCLOPRAMIDE HCL 5 MG PO TABS: 5.0000 mg | ORAL_TABLET | ORAL | 0 refills | Status: DC

## 2019-10-23 NOTE — Progress Notes (Signed)
No egg or soy allergy known to patient  No issues with past sedation with any surgeries  or procedures, no intubation problems  No diet pills per patient No home 02 use per patient  No blood thinners per patient  Pt denies issues with constipation  No A fib or A flutter  EMMI video sent to pt's e mail  Reglan 5 mg tabs sent in to pharmacy per Dr Loletha Carrow for her COlon - pt aware   Due to the COVID-19 pandemic we are asking patients to follow these guidelines. Please only bring one care partner. Please be aware that your care partner may wait in the car in the parking lot or if they feel like they will be too hot to wait in the car, they may wait in the lobby on the 4th floor. All care partners are required to wear a mask the entire time (we do not have any that we can provide them), they need to practice social distancing, and we will do a Covid check for all patient's and care partners when you arrive. Also we will check their temperature and your temperature. If the care partner waits in their car they need to stay in the parking lot the entire time and we will call them on their cell phone when the patient is ready for discharge so they can bring the car to the front of the building. Also all patient's will need to wear a mask into building.  Pt has  A Suprep kit at home

## 2019-10-30 ENCOUNTER — Other Ambulatory Visit: Payer: Self-pay

## 2019-10-30 ENCOUNTER — Other Ambulatory Visit: Payer: Self-pay | Admitting: Gastroenterology

## 2019-10-30 ENCOUNTER — Ambulatory Visit (INDEPENDENT_AMBULATORY_CARE_PROVIDER_SITE_OTHER): Payer: BC Managed Care – PPO

## 2019-10-30 DIAGNOSIS — Z1159 Encounter for screening for other viral diseases: Secondary | ICD-10-CM

## 2019-10-31 LAB — SARS CORONAVIRUS 2 (TAT 6-24 HRS): SARS Coronavirus 2: NEGATIVE

## 2019-11-04 ENCOUNTER — Inpatient Hospital Stay: Payer: BC Managed Care – PPO | Admitting: Hematology and Oncology

## 2019-11-04 ENCOUNTER — Telehealth: Payer: Self-pay | Admitting: Gastroenterology

## 2019-11-04 ENCOUNTER — Encounter: Payer: Self-pay | Admitting: Gastroenterology

## 2019-11-04 ENCOUNTER — Ambulatory Visit (AMBULATORY_SURGERY_CENTER): Payer: BC Managed Care – PPO | Admitting: Gastroenterology

## 2019-11-04 ENCOUNTER — Inpatient Hospital Stay: Payer: BC Managed Care – PPO

## 2019-11-04 ENCOUNTER — Other Ambulatory Visit: Payer: Self-pay

## 2019-11-04 VITALS — BP 107/83 | HR 67 | Temp 96.9°F | Resp 14 | Ht 64.0 in | Wt 208.0 lb

## 2019-11-04 DIAGNOSIS — D508 Other iron deficiency anemias: Secondary | ICD-10-CM

## 2019-11-04 DIAGNOSIS — K2950 Unspecified chronic gastritis without bleeding: Secondary | ICD-10-CM

## 2019-11-04 MED ORDER — SODIUM CHLORIDE 0.9 % IV SOLN: 500.0000 mL | Freq: Once | INTRAVENOUS | Status: DC

## 2019-11-04 NOTE — Telephone Encounter (Signed)
Pls call pt. She is upset because she has conflicting information about her procedure time. In Epic, her procedure says one time but her paperwork says a different time. Pt states that she received a call last minute to arrive earlier. Pt is leaving her house shortly to come for procedure and stated that will bring her ppwork and would like to speak with someone about it.

## 2019-11-04 NOTE — Telephone Encounter (Signed)
Phoned pt and explained that I was not sure why her appointment time had been changed.  She was just concerned that she would be able to have her procedure this morning.  I assured her that she would and we would be ready for her when she arrives.

## 2019-11-04 NOTE — Progress Notes (Signed)
Called to room to assist during endoscopic procedure.  Patient ID and intended procedure confirmed with present staff. Received instructions for my participation in the procedure from the performing physician.  

## 2019-11-04 NOTE — Patient Instructions (Signed)
YOU HAD AN ENDOSCOPIC PROCEDURE TODAY AT [facility]:   Refer to the procedure report that was given to you for any specific questions about what was found during the examination.  If the procedure report does not answer your questions, please call your gastroenterologist to clarify.  If you requested that your care partner not be given the details of your procedure findings, then the procedure report has been included in a sealed envelope for you to review at your convenience later.  YOU SHOULD EXPECT: Some feelings of bloating in the abdomen. Passage of more gas than usual.  Walking can help get rid of the air that was put into your GI tract during the procedure and reduce the bloating. If you had a lower endoscopy (such as a colonoscopy or flexible sigmoidoscopy) you may notice spotting of blood in your stool or on the toilet paper. If you underwent a bowel prep for your procedure, you may not have a normal bowel movement for a few days.  Please Note:  You might notice some irritation and congestion in your nose or some drainage.  This is from the oxygen used during your procedure.  There is no need for concern and it should clear up in a day or so.  SYMPTOMS TO REPORT IMMEDIATELY:   Following lower endoscopy (colonoscopy or flexible sigmoidoscopy):  Excessive amounts of blood in the stool  Significant tenderness or worsening of abdominal pains  Swelling of the abdomen that is new, acute  Fever of 100F or higher   Following upper endoscopy (EGD)  Vomiting of blood or coffee ground material  New chest pain or pain under the shoulder blades  Painful or persistently difficult swallowing  New shortness of breath  Fever of 100F or higher  Black, tarry-looking stools  For urgent or emergent issues, a gastroenterologist can be reached at any hour by calling [phone]. Do not use MyChart messaging for urgent concerns.    DIET:  We do recommend a small meal at first, but  then you may proceed to your regular diet.  Drink plenty of fluids but you should avoid alcoholic beverages for 24 hours.  ACTIVITY:  You should plan to take it easy for the rest of today and you should NOT DRIVE or use heavy machinery until tomorrow (because of the sedation medicines used during the test).    FOLLOW UP: Our staff will call the number listed on your records 48-72 hours following your procedure to check on you and address any questions or concerns that you may have regarding the information given to you following your procedure. If we do not reach you, we will leave a message.  We will attempt to reach you two times.  During this call, we will ask if you have developed any symptoms of COVID 19. If you develop any symptoms (ie: fever, flu-like symptoms, shortness of breath, cough etc.) before then, please call [phone].  If you test positive for Covid 19 in the 2 weeks post procedure, please call and report this information to [place].    If any biopsies were taken you will be contacted by phone or by letter within the next 1-3 weeks.  Please call us at [phone] if you have not heard about the biopsies in 3 weeks.    SIGNATURES/CONFIDENTIALITY: You and/or your care partner have signed paperwork which will be entered into your electronic medical record.  These signatures attest to the fact that that the information above on  your After Visit Summary has been reviewed and is understood.  Full responsibility of the confidentiality of this discharge information lies with you and/or your care-partner.   Resume medications. Information given on gastritis.

## 2019-11-04 NOTE — Progress Notes (Signed)
Temperature taken by J.B., VS taken by D.T. 

## 2019-11-04 NOTE — Op Note (Signed)
[facility] Patient Name: [name] [name] Procedure Date: 11/04/2019 10:33 AM MRN: [id number] Endoscopist: Mallie Mussel L. Loletha Carrow , MD Age: 49 Referring MD:  Date of Birth: 08/18/1971 Gender: Female Account #: [id number] Procedure:                Upper GI endoscopy Indications:              Iron deficiency anemia Medicines:                Monitored Anesthesia Care Procedure:                Pre-Anesthesia Assessment:                           - Prior to the procedure, a History and Physical                            was performed, and patient medications and                            allergies were reviewed. The patient's tolerance of                            previous anesthesia was also reviewed. The risks                            and benefits of the procedure and the sedation                            options and risks were discussed with the patient.                            All questions were answered, and informed consent                            was obtained. Prior Anticoagulants: The patient has                            taken no previous anticoagulant or antiplatelet                            agents except for aspirin. ASA Grade Assessment:                            III - A patient with severe systemic disease. After                            reviewing the risks and benefits, the patient was                            deemed in satisfactory condition to undergo the                            procedure.  After obtaining informed consent, the endoscope was                            passed under direct vision. Throughout the                            procedure, the patient's blood pressure, pulse, and                            oxygen saturations were monitored continuously. The                            Endoscope was introduced through the mouth, and                            advanced to the second part of duodenum. The upper                       GI endoscopy was accomplished without difficulty.                            The patient tolerated the procedure well. Scope In: Scope Out: Findings:                 The esophagus was normal.                           Patchy mild inflammation characterized by adherent                            blood and erythema was found in the gastric body                            and in the gastric antrum. Biopsies were taken with                            a cold forceps for histology. ([place] protocol).                           The exam of the stomach was otherwise normal.                           The cardia and gastric fundus were normal on                            retroflexion.                           The examined duodenum was normal. Biopsies for                            histology were taken with a cold forceps for                            evaluation of celiac disease. Complications:  No immediate complications. Estimated Blood Loss:     Estimated blood loss was minimal. Impression:               - Normal esophagus.                           - Gastritis. Biopsied. If H pylolri negative, then                            aspirin-related. Not sufficient to cause anemia.                           - Normal examined duodenum. Biopsied.                           Anemia appears to be on the basis of poor intstinal                            iron absorption. Recommendation:           - Patient has a contact number available for                            emergencies. The signs and symptoms of potential                            delayed complications were discussed with the                            patient. Return to normal activities tomorrow.                            Written discharge instructions were provided to the                            patient.                           - Resume previous diet.                           - Continue present  medications.                           - Await pathology results.                           - See the other procedure note for documentation of                            additional recommendations. Doug Bucklin L. Loletha Carrow, MD 11/04/2019 11:06:39 AM This report has been signed electronically. [place],

## 2019-11-04 NOTE — Progress Notes (Signed)
Pt's states no medical or surgical changes since previsit or office visit. 

## 2019-11-04 NOTE — Op Note (Signed)
[facility] Patient Name: [name] [name] Procedure Date: 11/04/2019 10:29 AM MRN: [id number] Endoscopist: [place]. Loletha Carrow , MD Age: 49 Referring MD:  Date of Birth: 1971/03/07 Gender: Female Account #: [id number] Procedure:                Colonoscopy Indications:              Iron deficiency anemia Medicines:                Monitored Anesthesia Care Procedure:                Pre-Anesthesia Assessment:                           - Prior to the procedure, a History and Physical                            was performed, and patient medications and                            allergies were reviewed. The patient's tolerance of                            previous anesthesia was also reviewed. The risks                            and benefits of the procedure and the sedation                            options and risks were discussed with the patient.                            All questions were answered, and informed consent                            was obtained. Prior Anticoagulants: The patient has                            taken no previous anticoagulant or antiplatelet                            agents except for aspirin. ASA Grade Assessment:                            III - A patient with severe systemic disease. After                            reviewing the risks and benefits, the patient was                            deemed in satisfactory condition to undergo the                            procedure.  After obtaining informed consent, the colonoscope                            was passed under direct vision. Throughout the                            procedure, the patient's blood pressure, pulse, and                            oxygen saturations were monitored continuously. The                            Colonoscope was introduced through the anus and                            advanced to the the cecum, identified by        appendiceal orifice and ileocecal valve. The                            colonoscopy was performed without difficulty. The                            patient tolerated the procedure well. The quality                            of the bowel preparation was excellent. The                            ileocecal valve, appendiceal orifice, and rectum                            were photographed. Scope In: 10:47:10 AM Scope Out: 11:01:09 AM Scope Withdrawal Time: 0 hours 7 minutes 14 seconds  Total Procedure Duration: 0 hours 13 minutes 59 seconds  Findings:                 The perianal and digital rectal examinations were                            normal.                           The entire examined colon appeared normal on direct                            and retroflexion views. Complications:            No immediate complications. Estimated Blood Loss:     Estimated blood loss: none. Impression:               - The entire examined colon is normal on direct and                            retroflexion views.                           -  No specimens collected. Recommendation:           - Patient has a contact number available for                            emergencies. The signs and symptoms of potential                            delayed complications were discussed with the                            patient. Return to normal activities tomorrow.                            Written discharge instructions were provided to the                            patient.                           - Resume previous diet.                           - Continue present medications.                           - Repeat colonoscopy in 10 years for screening                            purposes.                           - See the other procedure note for documentation of                            additional recommendations.                           - Continue care with hematology for periodic iron                             infusions. Jaala Bohle L. Loletha Carrow, MD 11/04/2019 11:10:10 AM This report has been signed electronically. [place],

## 2019-11-04 NOTE — Progress Notes (Signed)
pt tolerated well. VSS. awake and to recovery. Report given to RN.  

## 2019-11-05 ENCOUNTER — Inpatient Hospital Stay: Payer: BC Managed Care – PPO | Admitting: Hematology and Oncology

## 2019-11-05 ENCOUNTER — Inpatient Hospital Stay: Payer: BC Managed Care – PPO

## 2019-11-06 ENCOUNTER — Encounter: Payer: Self-pay | Admitting: Gastroenterology

## 2019-11-06 ENCOUNTER — Telehealth: Payer: Self-pay

## 2019-11-06 NOTE — Telephone Encounter (Signed)
  Follow up Call-  Call back number 11/04/2019  Post procedure Call Back phone  # [phone]  Permission to leave phone message Yes  Some recent data might be hidden     Patient questions:  Do you have a fever, pain , or abdominal swelling? No. Pain Score  0 *  Have you tolerated food without any problems? Yes.    Have you been able to return to your normal activities? Yes.    Do you have any questions about your discharge instructions: Diet   No. Medications  No. Follow up visit  No.  Do you have questions or concerns about your Care? No.  Actions: * If pain score is 4 or above: No action needed, pain <4.  1. Have you developed a fever since your procedure? no  2.   Have you had an respiratory symptoms (SOB or cough) since your procedure? no  3.   Have you tested positive for COVID 19 since your procedure no  4.   Have you had any family members/close contacts diagnosed with the COVID 19 since your procedure?  no   If yes to any of these questions please route to Joylene John, RN and Alphonsa Gin, Therapist, sports.

## 2019-11-17 ENCOUNTER — Other Ambulatory Visit: Payer: Self-pay | Admitting: Hematology and Oncology

## 2019-11-17 DIAGNOSIS — D500 Iron deficiency anemia secondary to blood loss (chronic): Secondary | ICD-10-CM

## 2019-11-17 NOTE — Progress Notes (Unsigned)
[facility] [phone]   [phone]  PROGRESS NOTE  Patient Care Team: Robyne Peers, MD as PCP - General (Family Medicine)  Hematological/Oncological History # Iron Deficiency Anemia 1) 11/03/2018: WBC 12.6 HB 9.8 plts 462 2) 01/15/2019: WBC 7 HB 10.2 plts 547 MCV 80, Sed rate 26 3) 01/23/2019-01/30/2019: Feraheme 510 mg IV D1 and D8 due to intolerance of PO iron 4) 03/18/2019: WBC 8.9 HB 13.5 plts 276. Ferritin 97. 5) 08/26/2019: Establish care with Dr. Lorenso Courier. WBC 14.7, Hgb 10.4, Plt 572. MCV 77.7. Iron 21, TIBC 395, Sat 5%, ferritin 20.  6) 11/18/2019: ***  Interval History:  [name] [name] 49 y.o. female with medical history significant for iron deficiency anemia presents for a follow up visit. She was last seen by Dr. Walden Field on 03/18/2019 at which time her Hgb improved markedly with IV iron administer in May 2020.   In the interim since her last visit she was admitted from 08/10/2019 to 08/17/2019 due to acute hypoxic respiratory failure due to PNA, which was thought to be 2/2 to COVID (of note, COVID tested negative). She missed her last scheduled appointment due to this admission.   On exam today she notes that she has recovered well from her hospitalization and is currently breathing at her baseline level.  She reports that she is not able to take iron pills because of severe nausea, vomiting, diarrhea and cramping.  She does have psoriatic arthritis and actively has inflamed lesions on her knuckles on her hands bilaterally.  She does note that she has been more cold as of late.  She reports that she does have menstrual cycles that do occur fairly regularly at 28-day intervals.  These periods are now described as vertically heavy and reports that she change a tampon every time she urinates and that they are not entirely saturated.  She reports that she did undergo a hip placement in September 2020, but has not noticed any changes in her symptoms  since that time.  On further discussion she notes that she had been set up for colonoscopy but notes that she has been busy and therefore has never been able to have this performed.  She reports that she did just finished a steroid taper from her hospitalization.  She reports that she is a vegetarian for the most part and avoids beef, though does periodically eat chicken.  She has no other concerns or complaints today.  She reports that her energy level is at baseline and that she has not been having any issues with shortness of breath, chest pain, or overt signs of bleeding such as nosebleeds, dark stools, or bruising.  A full 10 point ROS is listed below.  MEDICAL HISTORY:  Past Medical History:  Diagnosis Date  . Anxiety   . Blood transfusion without reported diagnosis   . Coronary artery disease   . Hyperlipidemia   . Hypertension   . Iron deficiency anemia 01/15/2019  . 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 Psoriatic arthritis ([place])   . Stroke [facility]) 2014    SURGICAL HISTORY: Past Surgical History:  Procedure Laterality Date  . CARPAL TUNNEL RELEASE     B/L hand  . CHOLECYSTECTOMY    . FOOT SURGERY     with hardware   . HERNIA REPAIR    . KNEE ARTHROSCOPY Bilateral 08/2014   [facility] Dr. Len Childs  . TEE WITHOUT CARDIOVERSION  01/22/2012   Procedure: TRANSESOPHAGEAL ECHOCARDIOGRAM (TEE);  Surgeon: Lelon Perla, MD;  Location: [facility] ENDOSCOPY;  Service: Cardiovascular;  Laterality: N/A;  . TOTAL HIP ARTHROPLASTY Bilateral     SOCIAL HISTORY: Social History   Socioeconomic History  . Marital status: Married    Spouse name: Not on file  . Number of children: Not on file  . Years of education: Not on file  . Highest education level: Not on file  Occupational History  . Not on file  Tobacco Use  . Smoking status: Never Smoker  . Smokeless tobacco: Never Used  . Tobacco comment: quit 10 years ago,  smoked intermittently for few years. Less than 10 yrs total.  Substance and Sexual Activity  . Alcohol use: Yes    Comment: occ- 1-2 drinks a week  . Drug use: No  . Sexual activity: Not on file  Other Topics Concern  . Not on file  Social History Narrative   Lives in [place] with her husband.   Has 4 healthy kids.   Social Determinants of Health   Financial Resource Strain:   . Difficulty of Paying Living Expenses:   Food Insecurity:   . Worried About Charity fundraiser in the Last Year:   . Arboriculturist in the Last Year:   Transportation Needs:   . Film/video editor (Medical):   Marland Kitchen Lack of Transportation (Non-Medical):   Physical Activity:   . Days of Exercise per Week:   . Minutes of Exercise per Session:   Stress:   . Feeling of Stress :   Social Connections:   . Frequency of Communication with Friends and Family:   . Frequency of Social Gatherings with Friends and Family:   . Attends Religious Services:   . Active Member of Clubs or Organizations:   . Attends Archivist Meetings:   Marland Kitchen Marital Status:   Intimate Partner Violence:   . Fear of Current or Ex-Partner:   . Emotionally Abused:   Marland Kitchen Physically Abused:   . Sexually Abused:     FAMILY HISTORY: Family History  Problem Relation Age of Onset  . Hypertension Mother   . Hypertension Father   . Colon cancer Maternal Grandfather   . Colon cancer Paternal Grandfather   . Colon polyps Neg Hx   . Esophageal cancer Neg Hx   . Rectal cancer Neg Hx   . Stomach cancer Neg Hx     ALLERGIES:  is allergic to nsaids and benzoin.  MEDICATIONS:  Current Outpatient Medications  Medication Sig Dispense Refill  . Abatacept (ORENCIA IV) Inject into the vein. Psoriatic arthristis every 4 weeks    . acetaminophen (TYLENOL) 650 MG CR tablet Take 650 mg by mouth every 8 (eight) hours as needed for pain.    Marland Kitchen albuterol (VENTOLIN HFA) 108 (90 Base) MCG/ACT inhaler Inhale 1-2 puffs into the lungs every 4  (four) hours as needed for wheezing or shortness of breath. 18 g 0  . Apremilast (OTEZLA) 10 & 20 & 30 MG TBPK Take by mouth.    Marland Kitchen aspirin 325 MG EC tablet Take by mouth.    Marland Kitchen atorvastatin (LIPITOR) 40 MG tablet Take 40 mg by mouth daily.    . Buprenorphine HCl (BELBUCA) 450 MCG FILM Place inside cheek.    . cephALEXin (KEFLEX) 500 MG capsule Take 500 mg by mouth 3 (three) times daily. 90 day supply    . cyclobenzaprine (FLEXERIL) 10 MG tablet Take 10  mg by mouth 3 (three) times daily.    Marland Kitchen doxycycline (VIBRAMYCIN) 100 MG capsule Take 100 mg by mouth 2 (two) times daily.    Marland Kitchen escitalopram (LEXAPRO) 20 MG tablet Take 20 mg by mouth daily.    . furosemide (LASIX) 20 MG tablet Take 20 mg by mouth daily.    . hydrochlorothiazide (MICROZIDE) 12.5 MG capsule Take 12.5 mg by mouth daily.    Marland Kitchen HYDROcodone-acetaminophen (NORCO/VICODIN) 5-325 MG tablet     . losartan (COZAAR) 100 MG tablet Take 100 mg by mouth daily.    . metoCLOPramide (REGLAN) 5 MG tablet Take 1 tablet (5 mg total) by mouth as directed. 2 tablet 0  . nystatin (MYCOSTATIN) 100000 UNIT/ML suspension Take 5 mLs by mouth 4 (four) times daily.    . ondansetron (ZOFRAN) 4 MG tablet     . OTEZLA 30 MG TABS Take 1 tablet by mouth 2 (two) times daily.    . pantoprazole (PROTONIX) 40 MG tablet TAKE 1 TABLET BY MOUTH EVERY DAY    . potassium chloride SA (K-DUR) 20 MEQ tablet Take 40 mEq by mouth daily.     . predniSONE (DELTASONE) 20 MG tablet Take 1-3 tablets (20-60 mg total) by mouth daily before breakfast. Take 3 tablets (60mg ) daily x 3 days, then 2 tablets (40mg ) daily x 3 days, then 1 tablet (20mg ) daily x 3 days then stop. 18 tablet 0  . pregabalin (LYRICA) 75 MG capsule Take 150 mg by mouth 3 (three) times daily.     . rifampin (RIFADIN) 300 MG capsule Take by mouth.     No current facility-administered medications for this visit.    REVIEW OF SYSTEMS:   Constitutional: ( - ) fevers, ( - )  chills , ( - ) night sweats Eyes: ( - )  blurriness of vision, ( - ) double vision, ( - ) watery eyes Ears, nose, mouth, throat, and face: ( - ) mucositis, ( - ) sore throat Respiratory: ( - ) cough, ( - ) dyspnea, ( - ) wheezes Cardiovascular: ( - ) palpitation, ( - ) chest discomfort, ( - ) lower extremity swelling Gastrointestinal:  ( - ) nausea, ( - ) heartburn, ( - ) change in bowel habits Skin: ( - ) abnormal skin rashes Lymphatics: ( - ) new lymphadenopathy, ( - ) easy bruising Neurological: ( - ) numbness, ( - ) tingling, ( - ) new weaknesses Behavioral/Psych: ( - ) mood change, ( - ) new changes  All other systems were reviewed with the patient and are negative.  PHYSICAL EXAMINATION: ECOG PERFORMANCE STATUS: 1 - Symptomatic but completely ambulatory  There were no vitals filed for this visit. There were no vitals filed for this visit.  GENERAL: well appearing middle aged Caucasian female in NAD  SKIN: skin color, texture, turgor are normal, no rashes or significant lesions EYES: conjunctiva are pink and non-injected, sclera clear LUNGS: clear to auscultation and percussion with normal breathing effort HEART: regular rate & rhythm and no murmurs and no lower extremity edema ABDOMEN: soft, non-tender, non-distended, normal bowel sounds Musculoskeletal: no cyanosis of digits and no clubbing. Scabbing over the knuckles bilaterally.  PSYCH: alert & oriented x 3, fluent speech NEURO: no focal motor/sensory deficits  LABORATORY DATA:  I have reviewed the data as listed Lab Results  Component Value Date   WBC 14.7 (H) 08/26/2019   HGB 10.4 (L) 08/26/2019   HCT 34.4 (L) 08/26/2019   MCV 77.7 (L) 08/26/2019  PLT 572 (H) 08/26/2019   NEUTROABS 11.0 (H) 08/26/2019     RADIOGRAPHIC STUDIES: I have personally reviewed the radiological images as listed and agreed with the findings in the report: no evidence of PE.   No results found.  ASSESSMENT & PLAN [name] [name] 49 y.o. female with medical history  significant for iron deficiency anemia presents for a follow up visit.  After discussion with the patient reviewed the labs her findings are most consistent with an iron deficiency anemia.  Unfortunately at this time it is not entirely clear what is the source of this iron deficiency.  She had fully replete stores in July of this year and and has trended downward to iron deficiency again.  This is quite concerning given that she is not having heavy menstrual cycles.  My concern would be that there is a potentially GI source for what we are seeing.  The patient has been scheduled for colonoscopy for however has declined to follow through with this.  Treatment of iron deficiency anemia is twofold #1 identify the source of the bleed and #2 replete the iron.  At this point time we are only replete in the iron as we are unsure what the source is.  I have stressed to her the importance of GI evaluation and assuring that there is no source of blood loss in the gastrointestinal tract.  This patient is not able to tolerate p.o. iron therapy and therefore is appropriate for IV iron.  She has responded well to [facility] before in the past and therefore I think we can try this again starting in January 2021.  We will plan to see her back in approximate 4 to 6 weeks after last dose of iron to assure that her stores repleted and that her hemoglobin is improving.  #Iron Deficiency Anemia --will plan for administration of IV iron in Jan 2021 as she is again depleted of her iron stores and cannot tolerate PO iron therapy --strongly recommend GI evaluation for consideration of colonoscopy for ongoing iron deficiency and mild menstrual cycles with no other overt sources of bleeding --RTC 4-6 weeks after last dose of IV iron to assure increase in Hgb  Orders Placed This Encounter  Procedures  . CBC with Differential (Cancer Center Only)    Standing Status:   Future    Standing Expiration Date:   11/16/2020  . Retic Panel     Standing Status:   Future    Standing Expiration Date:   11/16/2020  . CMP ([facility] only)    Standing Status:   Future    Standing Expiration Date:   11/16/2020  . Iron and TIBC    Standing Status:   Future    Standing Expiration Date:   11/16/2020  . Ferritin    Standing Status:   Future    Standing Expiration Date:   11/16/2020    All questions were answered. The patient knows to call the clinic with any problems, questions or concerns.  A total of more than 40 minutes were spent on this encounter and over half of that time was spent on counseling and coordination of care as outlined above.   Ledell Peoples, MD Department of Hematology/Oncology [facility] Phone: [phone] Pager: [phone] Email: [email or web address]  11/17/2019 10:04 PM

## 2019-11-18 ENCOUNTER — Inpatient Hospital Stay: Payer: BC Managed Care – PPO | Attending: Hematology and Oncology

## 2019-11-18 ENCOUNTER — Inpatient Hospital Stay (HOSPITAL_BASED_OUTPATIENT_CLINIC_OR_DEPARTMENT_OTHER): Payer: BC Managed Care – PPO | Admitting: Hematology and Oncology

## 2019-11-18 ENCOUNTER — Encounter: Payer: Self-pay | Admitting: Hematology and Oncology

## 2019-11-18 ENCOUNTER — Other Ambulatory Visit: Payer: Self-pay

## 2019-11-18 VITALS — BP 134/91 | HR 101 | Temp 98.0°F | Resp 20 | Ht 64.0 in | Wt 201.3 lb

## 2019-11-18 DIAGNOSIS — D72825 Bandemia: Secondary | ICD-10-CM | POA: Diagnosis not present

## 2019-11-18 DIAGNOSIS — R110 Nausea: Secondary | ICD-10-CM

## 2019-11-18 DIAGNOSIS — D500 Iron deficiency anemia secondary to blood loss (chronic): Secondary | ICD-10-CM

## 2019-11-18 LAB — CBC WITH DIFFERENTIAL (CANCER CENTER ONLY)
Abs Immature Granulocytes: 0.14 10*3/uL — ABNORMAL HIGH (ref 0.00–0.07)
Basophils Absolute: 0.1 10*3/uL (ref 0.0–0.1)
Basophils Relative: 1 %
Eosinophils Absolute: 0.1 10*3/uL (ref 0.0–0.5)
Eosinophils Relative: 1 %
HCT: 45.8 % (ref 36.0–46.0)
Hemoglobin: 15.3 g/dL — ABNORMAL HIGH (ref 12.0–15.0)
Immature Granulocytes: 1 %
Lymphocytes Relative: 15 %
Lymphs Abs: 2.1 10*3/uL (ref 0.7–4.0)
MCH: 29.7 pg (ref 26.0–34.0)
MCHC: 33.4 g/dL (ref 30.0–36.0)
MCV: 88.9 fL (ref 80.0–100.0)
Monocytes Absolute: 0.6 10*3/uL (ref 0.1–1.0)
Monocytes Relative: 4 %
Neutro Abs: 11.2 10*3/uL — ABNORMAL HIGH (ref 1.7–7.7)
Neutrophils Relative %: 78 %
Platelet Count: 521 10*3/uL — ABNORMAL HIGH (ref 150–400)
RBC: 5.15 MIL/uL — ABNORMAL HIGH (ref 3.87–5.11)
RDW: 18.1 % — ABNORMAL HIGH (ref 11.5–15.5)
WBC Count: 14.2 10*3/uL — ABNORMAL HIGH (ref 4.0–10.5)
nRBC: 0 % (ref 0.0–0.2)

## 2019-11-18 LAB — CMP (CANCER CENTER ONLY)
ALT: 27 U/L (ref 0–44)
AST: 22 U/L (ref 15–41)
Albumin: 4.4 g/dL (ref 3.5–5.0)
Alkaline Phosphatase: 137 U/L — ABNORMAL HIGH (ref 38–126)
Anion gap: 14 (ref 5–15)
BUN: 13 mg/dL (ref 6–20)
CO2: 23 mmol/L (ref 22–32)
Calcium: 8.8 mg/dL — ABNORMAL LOW (ref 8.9–10.3)
Chloride: 98 mmol/L (ref 98–111)
Creatinine: 0.73 mg/dL (ref 0.44–1.00)
GFR, Est AFR Am: >60 mL/min (ref >60)
GFR, Estimated: >60 mL/min (ref >60)
Glucose, Bld: 109 mg/dL — ABNORMAL HIGH (ref 70–99)
Potassium: 3.6 mmol/L (ref 3.5–5.1)
Sodium: 135 mmol/L (ref 135–145)
Total Bilirubin: 0.4 mg/dL (ref 0.3–1.2)
Total Protein: 7.7 g/dL (ref 6.5–8.1)

## 2019-11-18 LAB — RETIC PANEL
Immature Retic Fract: 6.7 % (ref 2.3–15.9)
RBC.: 5.16 MIL/uL — ABNORMAL HIGH (ref 3.87–5.11)
Retic Count, Absolute: 77.4 10*3/uL (ref 19.0–186.0)
Retic Ct Pct: 1.5 % (ref 0.4–3.1)
Reticulocyte Hemoglobin: 37.1 pg (ref >27.9)

## 2019-11-18 LAB — FERRITIN: Ferritin: 85 ng/mL (ref 11–307)

## 2019-11-18 LAB — IRON AND TIBC
Iron: 88 ug/dL (ref 41–142)
Saturation Ratios: 23 % (ref 21–57)
TIBC: 389 ug/dL (ref 236–444)
UIBC: 300 ug/dL (ref 120–384)

## 2019-11-18 LAB — SAVE SMEAR(SSMR), FOR PROVIDER SLIDE REVIEW

## 2019-11-18 LAB — SEDIMENTATION RATE: Sed Rate: 0 mm/hr (ref 0–22)

## 2019-11-18 MED ORDER — ONDANSETRON HCL 8 MG PO TABS: 4.0000 mg | ORAL_TABLET | Freq: Once | ORAL | Status: DC

## 2019-11-18 MED ORDER — ONDANSETRON HCL 8 MG PO TABS
ORAL_TABLET | ORAL | Status: AC
  Filled 2019-11-18: qty 1

## 2019-11-18 MED ORDER — ONDANSETRON HCL 8 MG PO TABS
8.0000 mg | ORAL_TABLET | Freq: Once | ORAL | Status: AC
  Administered 2019-11-18: 8 mg via ORAL

## 2019-11-18 NOTE — Progress Notes (Signed)
[place] [phone]   [phone]  PROGRESS NOTE  Patient Care Team: Robyne Peers, MD as PCP - General (Family Medicine)  Hematological/Oncological History # Iron Deficiency Anemia 1) 11/03/2018: WBC 12.6 HB 9.8 plts 462 2) 01/15/2019: WBC 7 HB 10.2 plts 547 MCV 80, Sed rate 26 3) 01/23/2019-01/30/2019: Feraheme 510 mg IV D1 and D8 due to intolerance of PO iron 4) 03/18/2019: WBC 8.9 HB 13.5 plts 276. Ferritin 97. 5) 08/26/2019: Establish care with Dr. Lorenso Courier. WBC 14.7, Hgb 10.4, Plt 572. MCV 77.7. Iron 21, TIBC 395, Sat 5%, ferritin 20.  6) 11/18/2019: WBC 14.2, Hgb 15.3, MCV 88.9, Plt 521. Iron 88, TIBC 389, Sat 23%, Ferritin 85.  Interval History:  [name] [name] 49 y.o. female with medical history significant for iron deficiency anemia presents for a follow up visit. She was last seen on 08/26/2019 and received IV feraheme  on 09/15/2019 and 09/25/2019.    In the interim since her last visit she notes she has had an improvement in energy.  Unfortunately today she is experiencing nausea and vomiting associated with a flare in her irritable bowel syndrome.  She also notes that her psoriasis is under poor control and that she currently has scabbing and dry skin over the knuckles on her hands bilaterally.  She reports that she tolerated the iron therapy well without any difficulty.  Furthermore she had no additional questions concerns or complaints.  She does appear uncomfortable on today's exam and has requested Zofran to help treat her nausea.  A full 10 point ROS was otherwise negative.  MEDICAL HISTORY:  Past Medical History:  Diagnosis Date  . Anxiety   . Blood transfusion without reported diagnosis   . Coronary artery disease   . Hyperlipidemia   . Hypertension   . Iron deficiency anemia 01/15/2019  . 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 Psoriatic arthritis  ([place])   . Stroke [facility]) 2014    SURGICAL HISTORY: Past Surgical History:  Procedure Laterality Date  . CARPAL TUNNEL RELEASE     B/L hand  . CHOLECYSTECTOMY    . FOOT SURGERY     with hardware   . HERNIA REPAIR    . KNEE ARTHROSCOPY Bilateral 08/2014   [facility] Child[facility]  . TEE WITHOUT CARDIOVERSION  01/22/2012   Procedure: TRANSESOPHAGEAL ECHOCARDIOGRAM (TEE);  Surgeon: Lelon Perla, MD;  Location: [facility] ENDOSCOPY;  Service: Cardiovascular;  Laterality: N/A;  . TOTAL HIP ARTHROPLASTY Bilateral     SOCIAL HISTORY: Social History   Socioeconomic History  . Marital status: Married    Spouse name: Not on file  . Number of children: Not on file  . Years of education: Not on file  . Highest education level: Not on file  Occupational History  . Not on file  Tobacco Use  . Smoking status: Never Smoker  . Smokeless tobacco: Never Used  . Tobacco comment: quit 10 years ago, smoked intermittently for few years. Less than 10 yrs total.  Substance and Sexual Activity  . Alcohol use: Yes    Comment: occ- 1-2 drinks a week  . Drug use: No  . Sexual activity: Not on file  Other Topics Concern  . Not on file  Social History Narrative   Lives in [place] with her husband.   Has 4 healthy kids.   Social Determinants of Health   Financial Resource Strain:   .  Difficulty of Paying Living Expenses:   Food Insecurity:   . Worried About Charity fundraiser in the Last Year:   . Arboriculturist in the Last Year:   Transportation Needs:   . Film/video editor (Medical):   Marland Kitchen Lack of Transportation (Non-Medical):   Physical Activity:   . Days of Exercise per Week:   . Minutes of Exercise per Session:   Stress:   . Feeling of Stress :   Social Connections:   . Frequency of Communication with Friends and Family:   . Frequency of Social Gatherings with Friends and Family:   . Attends Religious Services:   . Active Member of Clubs or Organizations:   . Attends Theatre manager Meetings:   Marland Kitchen Marital Status:   Intimate Partner Violence:   . Fear of Current or Ex-Partner:   . Emotionally Abused:   Marland Kitchen Physically Abused:   . Sexually Abused:     FAMILY HISTORY: Family History  Problem Relation Age of Onset  . Hypertension Mother   . Hypertension Father   . Colon cancer Maternal Grandfather   . Colon cancer Paternal Grandfather   . Colon polyps Neg Hx   . Esophageal cancer Neg Hx   . Rectal cancer Neg Hx   . Stomach cancer Neg Hx     ALLERGIES:  is allergic to nsaids and benzoin.  MEDICATIONS:  Current Outpatient Medications  Medication Sig Dispense Refill  . Abatacept (ORENCIA IV) Inject into the vein. Psoriatic arthristis every 4 weeks    . acetaminophen (TYLENOL) 650 MG CR tablet Take 650 mg by mouth every 8 (eight) hours as needed for pain.    Marland Kitchen albuterol (VENTOLIN HFA) 108 (90 Base) MCG/ACT inhaler Inhale 1-2 puffs into the lungs every 4 (four) hours as needed for wheezing or shortness of breath. 18 g 0  . Apremilast (OTEZLA) 10 & 20 & 30 MG TBPK Take by mouth.    Marland Kitchen aspirin 325 MG EC tablet Take by mouth.    Marland Kitchen atorvastatin (LIPITOR) 40 MG tablet Take 40 mg by mouth daily.    . Buprenorphine HCl (BELBUCA) 450 MCG FILM Place inside cheek.    . cephALEXin (KEFLEX) 500 MG capsule Take 500 mg by mouth 3 (three) times daily. 90 day supply    . cyclobenzaprine (FLEXERIL) 10 MG tablet Take 10 mg by mouth 3 (three) times daily.    Marland Kitchen doxycycline (VIBRAMYCIN) 100 MG capsule Take 100 mg by mouth 2 (two) times daily.    Marland Kitchen escitalopram (LEXAPRO) 20 MG tablet Take 20 mg by mouth daily.    . furosemide (LASIX) 20 MG tablet Take 20 mg by mouth daily.    . hydrochlorothiazide (MICROZIDE) 12.5 MG capsule Take 12.5 mg by mouth daily.    Marland Kitchen HYDROcodone-acetaminophen (NORCO/VICODIN) 5-325 MG tablet     . losartan (COZAAR) 100 MG tablet Take 100 mg by mouth daily.    . metoCLOPramide (REGLAN) 5 MG tablet Take 1 tablet (5 mg total) by mouth as directed. 2  tablet 0  . nystatin (MYCOSTATIN) 100000 UNIT/ML suspension Take 5 mLs by mouth 4 (four) times daily.    . ondansetron (ZOFRAN) 4 MG tablet     . OTEZLA 30 MG TABS Take 1 tablet by mouth 2 (two) times daily.    . pantoprazole (PROTONIX) 40 MG tablet TAKE 1 TABLET BY MOUTH EVERY DAY    . potassium chloride SA (K-DUR) 20 MEQ tablet Take 40 mEq by mouth  daily.     . predniSONE (DELTASONE) 20 MG tablet Take 1-3 tablets (20-60 mg total) by mouth daily before breakfast. Take 3 tablets (60mg ) daily x 3 days, then 2 tablets (40mg ) daily x 3 days, then 1 tablet (20mg ) daily x 3 days then stop. 18 tablet 0  . pregabalin (LYRICA) 75 MG capsule Take 150 mg by mouth 3 (three) times daily.     . rifampin (RIFADIN) 300 MG capsule Take by mouth.     No current facility-administered medications for this visit.    REVIEW OF SYSTEMS:   Constitutional: ( - ) fevers, ( - )  chills , ( - ) night sweats Eyes: ( - ) blurriness of vision, ( - ) double vision, ( - ) watery eyes Ears, nose, mouth, throat, and face: ( - ) mucositis, ( - ) sore throat Respiratory: ( - ) cough, ( - ) dyspnea, ( - ) wheezes Cardiovascular: ( - ) palpitation, ( - ) chest discomfort, ( - ) lower extremity swelling Gastrointestinal:  ( - ) nausea, ( - ) heartburn, ( - ) change in bowel habits Skin: ( - ) abnormal skin rashes Lymphatics: ( - ) new lymphadenopathy, ( - ) easy bruising Neurological: ( - ) numbness, ( - ) tingling, ( - ) new weaknesses Behavioral/Psych: ( - ) mood change, ( - ) new changes  All other systems were reviewed with the patient and are negative.  PHYSICAL EXAMINATION: ECOG PERFORMANCE STATUS: 1 - Symptomatic but completely ambulatory  Vitals:   11/18/19 1005 11/18/19 1006  BP: (!) 151/103 (!) 134/91  Pulse: (!) 101   Resp: 20   Temp: 98 F (36.7 C)   SpO2: 97%    Filed Weights   11/18/19 1005  Weight: 201 lb 4.8 oz (91.3 kg)    GENERAL: well appearing middle aged Caucasian female in NAD  SKIN: skin  color, texture, turgor are normal, no rashes or significant lesions EYES: conjunctiva are pink and non-injected, sclera clear LUNGS: clear to auscultation and percussion with normal breathing effort HEART: regular rate & rhythm and no murmurs and no lower extremity edema Musculoskeletal: no cyanosis of digits and no clubbing. Scabbing over the knuckles bilaterally consistent with psoriasis PSYCH: alert & oriented x 3, fluent speech NEURO: no focal motor/sensory deficits  LABORATORY DATA:  I have reviewed the data as listed  CBC Latest Ref Rng & Units 11/18/2019 08/26/2019 08/17/2019  WBC 4.0 - 10.5 K/uL 14.2(H) 14.7(H) 14.8(H)  Hemoglobin 12.0 - 15.0 g/dL 15.3(H) 10.4(L) 10.4(L)  Hematocrit 36.0 - 46.0 % 45.8 34.4(L) 34.9(L)  Platelets 150 - 400 K/uL 521(H) 572(H) 519(H)    CMP Latest Ref Rng & Units 11/18/2019 08/26/2019 08/17/2019  Glucose 70 - 99 mg/dL 109(H) 99 98  BUN 6 - 20 mg/dL 13 15 18   Creatinine 0.44 - 1.00 mg/dL 0.73 0.65 0.47  Sodium 135 - 145 mmol/L 135 138 139  Potassium 3.5 - 5.1 mmol/L 3.6 3.8 4.3  Chloride 98 - 111 mmol/L 98 97(L) 99  CO2 22 - 32 mmol/L 23 30 31   Calcium 8.9 - 10.3 mg/dL 8.8(L) 8.2(L) 8.1(L)  Total Protein 6.5 - 8.1 g/dL 7.7 6.8 6.0(L)  Total Bilirubin 0.3 - 1.2 mg/dL 0.4 0.3 0.5  Alkaline Phos 38 - 126 U/L 137(H) 114 86  AST 15 - 41 U/L 22 21 13(L)  ALT 0 - 44 U/L 27 21 21      RADIOGRAPHIC STUDIES: None relevant to review.   No results found.  ASSESSMENT & PLAN [name] [name] 49 y.o. female with medical history significant for iron deficiency anemia presents for a follow up visit.  On exam today the patient has had a marked change in her labs.  She is gone from a hemoglobin of approximately 10 up to greater than 15.  She also has a leukocytosis, and continue to thrombocytosis.  Given these elevation all of her counts I would like to pursue an MPN work-up to assure that there is no other possible underlying etiology for these  abnormalities.  Of note ET and PV are capable of causing iron deficiency which may be muddling her current picture.  Overall it does appear that her iron stores are repleted.  In the event that our MPN work-up shows no clear etiology for her increased cell lines I would favor chronic inflammation as the most likely source.  We will have the patient follow-up in approximately 3 months time or sooner if any abnormalities are noted on the MPN work-up.  #Iron Deficiency Anemia, improved --s/p IV feraheme infusions on 09/15/2019 and 09/25/2019. --GI evaluation with EGD/colonoscopy on 11/04/2019 revealed no clear source for her iron deficiency --chronic inflammation from her difficult to control psoriasis is the most likely etiology of the iron deficiency --iron levels appear replete, Hgb >12. No indication for further IV iron therapy at this time.  --RTC 3 months to recheck CBC and iron levels. Can return sooner if abnormality in the MPN workup  #Leukocytosis #Thrombocytosis --initially thought to be 2/2 to inflammation from her psoriasis or thrombocytosis 2/2 to iron deficiency, however patient continues to have thrombocytosis and now has a mild polycythemia --etiology of the patient's iron deficiency is unclear after GI evaluation, of note MPNs can result in iron deficiency --will order JAK2 panel w/ reflex and BCR/ABL FISH to assess for MPN --if no clear etiology can be discerned from above labwork, then the cause is most likely chronic inflammation. --continue to monitor.   #Nausea --patient notes this is associated with a flair in her IBS symptoms --provide zofran 8mg  PO in clinic today   Orders Placed This Encounter  Procedures  . Save Smear (SSMR)    Standing Status:   Future    Standing Expiration Date:   11/17/2020  . JAK2 (INCLUDING V617F AND EXON 12), MPL,& CALR W/RFL MPN PANEL (NGS)    Standing Status:   Future    Standing Expiration Date:   11/17/2020  . BCR ABL1 FISH ([facility])     Standing Status:   Future    Standing Expiration Date:   11/17/2020  . Sedimentation rate    Standing Status:   Future    Standing Expiration Date:   11/17/2020    All questions were answered. The patient knows to call the clinic with any problems, questions or concerns.  A total of more than 20 minutes were spent on this encounter and over half of that time was spent on counseling and coordination of care as outlined above.   Ledell Peoples, MD Department of Hematology/Oncology [place] [facility] Phone: [phone] Pager: [phone] Email: [email or web address]  11/18/2019 11:24 AM

## 2019-11-19 ENCOUNTER — Telehealth: Payer: Self-pay | Admitting: Hematology and Oncology

## 2019-11-19 ENCOUNTER — Inpatient Hospital Stay (HOSPITAL_COMMUNITY)
Admission: EM | Admit: 2019-11-19 | Discharge: 2019-11-21 | DRG: 683 | Disposition: A | Discharge status: Home / Self Care | Payer: BC Managed Care – PPO | Attending: Internal Medicine | Admitting: Internal Medicine

## 2019-11-19 ENCOUNTER — Emergency Department (HOSPITAL_COMMUNITY): Payer: BC Managed Care – PPO

## 2019-11-19 ENCOUNTER — Encounter (HOSPITAL_COMMUNITY): Payer: Self-pay

## 2019-11-19 ENCOUNTER — Other Ambulatory Visit: Payer: Self-pay

## 2019-11-19 DIAGNOSIS — E782 Mixed hyperlipidemia: Secondary | ICD-10-CM | POA: Diagnosis present

## 2019-11-19 DIAGNOSIS — G2581 Restless legs syndrome: Secondary | ICD-10-CM | POA: Diagnosis present

## 2019-11-19 DIAGNOSIS — E871 Hypo-osmolality and hyponatremia: Secondary | ICD-10-CM | POA: Diagnosis present

## 2019-11-19 DIAGNOSIS — Z20822 Contact with and (suspected) exposure to covid-19: Secondary | ICD-10-CM | POA: Diagnosis present

## 2019-11-19 DIAGNOSIS — I959 Hypotension, unspecified: Secondary | ICD-10-CM | POA: Diagnosis present

## 2019-11-19 DIAGNOSIS — Z96643 Presence of artificial hip joint, bilateral: Secondary | ICD-10-CM | POA: Diagnosis present

## 2019-11-19 DIAGNOSIS — Z91048 Other nonmedicinal substance allergy status: Secondary | ICD-10-CM

## 2019-11-19 DIAGNOSIS — R55 Syncope and collapse: Secondary | ICD-10-CM | POA: Diagnosis not present

## 2019-11-19 DIAGNOSIS — Z7982 Long term (current) use of aspirin: Secondary | ICD-10-CM

## 2019-11-19 DIAGNOSIS — E872 Acidosis, unspecified: Secondary | ICD-10-CM

## 2019-11-19 DIAGNOSIS — E876 Hypokalemia: Secondary | ICD-10-CM | POA: Diagnosis present

## 2019-11-19 DIAGNOSIS — E860 Dehydration: Secondary | ICD-10-CM | POA: Diagnosis present

## 2019-11-19 DIAGNOSIS — Z800 Family history of malignant neoplasm of digestive organs: Secondary | ICD-10-CM | POA: Diagnosis not present

## 2019-11-19 DIAGNOSIS — I10 Essential (primary) hypertension: Secondary | ICD-10-CM | POA: Diagnosis present

## 2019-11-19 DIAGNOSIS — Z8673 Personal history of transient ischemic attack (TIA), and cerebral infarction without residual deficits: Secondary | ICD-10-CM

## 2019-11-19 DIAGNOSIS — R1111 Vomiting without nausea: Secondary | ICD-10-CM

## 2019-11-19 DIAGNOSIS — Z8249 Family history of ischemic heart disease and other diseases of the circulatory system: Secondary | ICD-10-CM

## 2019-11-19 DIAGNOSIS — I2510 Atherosclerotic heart disease of native coronary artery without angina pectoris: Secondary | ICD-10-CM | POA: Diagnosis present

## 2019-11-19 DIAGNOSIS — Z886 Allergy status to analgesic agent status: Secondary | ICD-10-CM | POA: Diagnosis not present

## 2019-11-19 DIAGNOSIS — N179 Acute kidney failure, unspecified: Secondary | ICD-10-CM | POA: Diagnosis present

## 2019-11-19 DIAGNOSIS — K589 Irritable bowel syndrome without diarrhea: Secondary | ICD-10-CM | POA: Diagnosis present

## 2019-11-19 DIAGNOSIS — R112 Nausea with vomiting, unspecified: Secondary | ICD-10-CM | POA: Diagnosis not present

## 2019-11-19 DIAGNOSIS — L4050 Arthropathic psoriasis, unspecified: Secondary | ICD-10-CM | POA: Diagnosis present

## 2019-11-19 DIAGNOSIS — E785 Hyperlipidemia, unspecified: Secondary | ICD-10-CM | POA: Diagnosis present

## 2019-11-19 DIAGNOSIS — R7401 Elevation of levels of liver transaminase levels: Secondary | ICD-10-CM | POA: Diagnosis present

## 2019-11-19 DIAGNOSIS — R7989 Other specified abnormal findings of blood chemistry: Secondary | ICD-10-CM

## 2019-11-19 DIAGNOSIS — D509 Iron deficiency anemia, unspecified: Secondary | ICD-10-CM | POA: Diagnosis present

## 2019-11-19 LAB — URINALYSIS, ROUTINE W REFLEX MICROSCOPIC
Bilirubin Urine: NEGATIVE
Glucose, UA: NEGATIVE mg/dL
Hgb urine dipstick: NEGATIVE
Ketones, ur: NEGATIVE mg/dL
Leukocytes,Ua: NEGATIVE
Nitrite: NEGATIVE
Protein, ur: 100 mg/dL — AB
Specific Gravity, Urine: 1.013 (ref 1.005–1.030)
pH: 5 (ref 5.0–8.0)

## 2019-11-19 LAB — CBC WITH DIFFERENTIAL/PLATELET
Abs Immature Granulocytes: 0.09 10*3/uL — ABNORMAL HIGH (ref 0.00–0.07)
Basophils Absolute: 0.1 10*3/uL (ref 0.0–0.1)
Basophils Relative: 0 %
Eosinophils Absolute: 0 10*3/uL (ref 0.0–0.5)
Eosinophils Relative: 0 %
HCT: 40.1 % (ref 36.0–46.0)
Hemoglobin: 13.3 g/dL (ref 12.0–15.0)
Immature Granulocytes: 1 %
Lymphocytes Relative: 13 %
Lymphs Abs: 1.6 10*3/uL (ref 0.7–4.0)
MCH: 30.6 pg (ref 26.0–34.0)
MCHC: 33.2 g/dL (ref 30.0–36.0)
MCV: 92.2 fL (ref 80.0–100.0)
Monocytes Absolute: 1.2 10*3/uL — ABNORMAL HIGH (ref 0.1–1.0)
Monocytes Relative: 10 %
Neutro Abs: 9.8 10*3/uL — ABNORMAL HIGH (ref 1.7–7.7)
Neutrophils Relative %: 76 %
Platelets: 402 10*3/uL — ABNORMAL HIGH (ref 150–400)
RBC: 4.35 MIL/uL (ref 3.87–5.11)
RDW: 18.3 % — ABNORMAL HIGH (ref 11.5–15.5)
WBC: 12.8 10*3/uL — ABNORMAL HIGH (ref 4.0–10.5)
nRBC: 0 % (ref 0.0–0.2)

## 2019-11-19 LAB — ACETAMINOPHEN LEVEL: Acetaminophen (Tylenol), Serum: <10 ug/mL — ABNORMAL LOW (ref 10–30)

## 2019-11-19 LAB — MAGNESIUM: Magnesium: 2.1 mg/dL (ref 1.7–2.4)

## 2019-11-19 LAB — COMPREHENSIVE METABOLIC PANEL
ALT: 189 U/L — ABNORMAL HIGH (ref 0–44)
AST: 526 U/L — ABNORMAL HIGH (ref 15–41)
Albumin: 3.7 g/dL (ref 3.5–5.0)
Alkaline Phosphatase: 156 U/L — ABNORMAL HIGH (ref 38–126)
Anion gap: 16 — ABNORMAL HIGH (ref 5–15)
BUN: 12 mg/dL (ref 6–20)
CO2: 24 mmol/L (ref 22–32)
Calcium: 8 mg/dL — ABNORMAL LOW (ref 8.9–10.3)
Chloride: 91 mmol/L — ABNORMAL LOW (ref 98–111)
Creatinine, Ser: 1.17 mg/dL — ABNORMAL HIGH (ref 0.44–1.00)
GFR calc Af Amer: >60 mL/min (ref >60)
GFR calc non Af Amer: 55 mL/min — ABNORMAL LOW (ref >60)
Glucose, Bld: 102 mg/dL — ABNORMAL HIGH (ref 70–99)
Potassium: 3 mmol/L — ABNORMAL LOW (ref 3.5–5.1)
Sodium: 131 mmol/L — ABNORMAL LOW (ref 135–145)
Total Bilirubin: 1 mg/dL (ref 0.3–1.2)
Total Protein: 6.4 g/dL — ABNORMAL LOW (ref 6.5–8.1)

## 2019-11-19 LAB — LACTIC ACID, PLASMA
Lactic Acid, Venous: 5 mmol/L (ref 0.5–1.9)
Lactic Acid, Venous: 4 mmol/L (ref 0.5–1.9)

## 2019-11-19 LAB — LIPASE, BLOOD: Lipase: 42 U/L (ref 11–51)

## 2019-11-19 LAB — PROTIME-INR
INR: 1 (ref 0.8–1.2)
Prothrombin Time: 13.1 seconds (ref 11.4–15.2)

## 2019-11-19 LAB — POC URINE PREG, ED: Preg Test, Ur: NEGATIVE

## 2019-11-19 MED ORDER — SODIUM CHLORIDE 0.9 % IV SOLN: Freq: Once | INTRAVENOUS | Status: AC

## 2019-11-19 MED ORDER — SODIUM CHLORIDE 0.9% FLUSH
3.0000 mL | Freq: Two times a day (BID) | INTRAVENOUS | Status: DC
  Administered 2019-11-20: 3 mL via INTRAVENOUS

## 2019-11-19 MED ORDER — LACTATED RINGERS IV SOLN: INTRAVENOUS | Status: DC

## 2019-11-19 MED ORDER — LORAZEPAM 2 MG/ML IJ SOLN
0.5000 mg | INTRAMUSCULAR | Status: DC | PRN
  Administered 2019-11-20 (×3): 0.5 mg via INTRAVENOUS
  Filled 2019-11-19 (×3): qty 1

## 2019-11-19 MED ORDER — ACETAMINOPHEN 650 MG RE SUPP: 650.0000 mg | Freq: Four times a day (QID) | RECTAL | Status: DC | PRN

## 2019-11-19 MED ORDER — BUPRENORPHINE HCL 450 MCG BU FILM: 450.0000 ug | ORAL_FILM | Freq: Two times a day (BID) | BUCCAL | Status: DC

## 2019-11-19 MED ORDER — PREGABALIN 50 MG PO CAPS
150.0000 mg | ORAL_CAPSULE | Freq: Three times a day (TID) | ORAL | Status: DC
  Administered 2019-11-20 – 2019-11-21 (×4): 150 mg via ORAL
  Filled 2019-11-19 (×4): qty 3

## 2019-11-19 MED ORDER — ESCITALOPRAM OXALATE 10 MG PO TABS
20.0000 mg | ORAL_TABLET | Freq: Every day | ORAL | Status: DC
  Administered 2019-11-20 – 2019-11-21 (×2): 20 mg via ORAL
  Filled 2019-11-19 (×2): qty 2

## 2019-11-19 MED ORDER — LORAZEPAM 2 MG/ML IJ SOLN
1.0000 mg | Freq: Once | INTRAMUSCULAR | Status: AC
  Administered 2019-11-19: 1 mg via INTRAVENOUS
  Filled 2019-11-19: qty 1

## 2019-11-19 MED ORDER — ASPIRIN EC 325 MG PO TBEC
325.0000 mg | DELAYED_RELEASE_TABLET | Freq: Every day | ORAL | Status: DC
  Administered 2019-11-20 – 2019-11-21 (×2): 325 mg via ORAL
  Filled 2019-11-19 (×2): qty 1

## 2019-11-19 MED ORDER — SODIUM CHLORIDE 0.9 % IV BOLUS
2000.0000 mL | Freq: Once | INTRAVENOUS | Status: AC
  Administered 2019-11-19: 2000 mL via INTRAVENOUS

## 2019-11-19 MED ORDER — ACETAMINOPHEN 325 MG PO TABS: 650.0000 mg | ORAL_TABLET | Freq: Four times a day (QID) | ORAL | Status: DC | PRN

## 2019-11-19 MED ORDER — PROCHLORPERAZINE EDISYLATE 10 MG/2ML IJ SOLN
10.0000 mg | Freq: Once | INTRAMUSCULAR | Status: AC
  Administered 2019-11-19: 10 mg via INTRAVENOUS
  Filled 2019-11-19: qty 2

## 2019-11-19 MED ORDER — SODIUM CHLORIDE 0.9 % IV BOLUS
1000.0000 mL | Freq: Once | INTRAVENOUS | Status: AC
  Administered 2019-11-19: 1000 mL via INTRAVENOUS

## 2019-11-19 MED ORDER — POTASSIUM CHLORIDE 10 MEQ/100ML IV SOLN
10.0000 meq | INTRAVENOUS | Status: AC
  Administered 2019-11-19 – 2019-11-20 (×4): 10 meq via INTRAVENOUS
  Filled 2019-11-19 (×4): qty 100

## 2019-11-19 MED ORDER — DIPHENHYDRAMINE HCL 50 MG/ML IJ SOLN
25.0000 mg | Freq: Once | INTRAMUSCULAR | Status: AC
  Administered 2019-11-19: 25 mg via INTRAVENOUS
  Filled 2019-11-19: qty 1

## 2019-11-19 NOTE — ED Notes (Signed)
Pt sitting up in bed. Full monitor on. Pt denies any needs. Will continue to monitor.

## 2019-11-19 NOTE — ED Triage Notes (Signed)
Pt arrives [facility] from home with complaints of N/V/D since Tuesday. Pt reports near syncope today. EMS reports initial BP of 58/20 manually. EMS administered 10105ml NS. 8mg  Zofran IV

## 2019-11-19 NOTE — Telephone Encounter (Signed)
Scheduled per los. Called and left msg. Mailed printout  °

## 2019-11-19 NOTE — H&P (Signed)
History and Physical    PLEASE NOTE THAT DRAGON DICTATION SOFTWARE WAS USED IN THE CONSTRUCTION OF THIS NOTE.   [name] [name] [id number]:[id number] DOB: [birth year] DOA: 11/19/2019  PCP: Robyne Peers, MD Patient coming from: home   I have personally briefly reviewed patient's old medical records in [place]  Chief Complaint: Nausea and vomiting  HPI: [name] [name] is a 49 y.o. female with medical history significant for psoriatic arthritis on chronic immunosuppression, chronic iron deficiency anemia on intermittent IV iron transfusions, hypertension, who is admitted to [facility] on 11/19/2019 with acute kidney injury after presenting from home to [facility] Emergency Department complaining of nausea vomiting.   The patient reports 3 to 4 days of nausea resulting in 4-5 daily episodes of nonbilious, nonbloody emesis over that time.  In the setting, she reports that she is been unable to down any food, fluid, or oral medications over that timeframe.  She reports associated generalized nonradiating crampy abdominal discomfort, which worsens with palpation over the abdomen.  Denies any associated diarrhea, melena, or hematochezia.  Denies any recent travel or trauma.  Denies any subjective fever, chills, rigors, or generalized myalgias. Denies any recent headache, neck stiffness, rhinitis, rhinorrhea, sore throat, sob, cough, or rash. No known COVID-19 exposures. Denies dysuria, gross hematuria, or change in urinary urgency/frequency.  Later today, the patient reports that she attempted to quickly get out of bed, attempting to rise from a supine to a standing position, at which time she lost consciousness, and fell to the floor below in the setting of preceding prodrome in which she briefly became diaphoretic, nauseous, and dizzy.  She does not believe that she hit her head as a component of this fall to the floor, and denies any preceding or subsequent chest  pain, shortness of breath.  Denies any ensuing headache.  While the syncope episode was unwitnessed, the patient believes that only a few seconds past during her time of being unconscious.   Medical history is notable for history of psoriatic arthritis for which she treated with the immunosuppressant Abatacept.  She also has a history of chronic iron deficiency anemia for which she follows with Dr. Lorenso Courier as her outpatient heme-onc provider, and undergoes intermittent IV iron supplementation.  Following EGD/colonoscopy performed during the first week of March 2021 to evaluate the underlying source of her chronic iron deficiency anemia, it appears that the source is suspected to be diminished gastrointestinal absorption of enteric iron.     ED Course:  Vital signs in the ED were notable for the following: Temperature max 99.1; heart rate 66-80; initial blood pressure found to be 89/51, which improved to 130/72 following interval administration of IV fluids, as further described below; respiratory rate 16-21; oxygen saturation 94 to 98% on room air.  Labs were notable for the following: CMP notable for sodium 131, relative to most recent prior sodium data point of 135 on 11/18/2019, potassium 3.0, chloride 91, bicarbonate 24, BUN 12, creatinine 1.17 relative to most recent prior creatinine value of 0.73 on 11/18/2019, alk phos 156 compared to 137 on 11/18/2019, AST 526 compared to 22 on 11/18/2019, ALT 189 compared to 27 on 11/18/2019, and total bilirubin 1.0.  CBC notable for white blood cell count of 12,800 with 76% neutrophils, hemoglobin 13.3.  Serum acetaminophen level found to be less than 10.  Urinalysis showed 6-10 white blood cells, rare bacteria, hyaline casts, and 100 protein.  Nasopharyngeal COVID-19 PCR was collected in the ED,  with result currently pending.  Initial lactic acid found to be 5, which decreased to 4 following interval IV fluids, as further described below.  EKG performed in the ED  this evening showed sinus rhythm with heart rate 77, prolonged QTC of 515 ms, and no evidence of acute ischemic changes.  Chest x-ray showed no evidence of acute cardiopulmonary process.  Given that patient experienced an episode of syncope in which she had an unwitnessed fall to the floor, CT of the head was performed and showed no evidence of acute intracranial process, including no evidence of intracranial hemorrhage.  CT of the abdomen/pelvis showed nondilated, noninflamed fluid-filled small bowel consistent with enteritis in the absence of any evidence of abscess, perforation, or obstruction.   While in the ED, the following were administered: 2 L normal saline bolus followed by transition to normal saline at 125 cc/h; Ativan 1 mg IV x1, Compazine 10 mg IV x1.  Additionally, potassium chloride for milliequivalents IV over 4 hours x 1 was initiated while still in the ED.      Review of Systems: As per HPI otherwise 10 point review of systems negative.   Past Medical History:  Diagnosis Date  . Anxiety   . Blood transfusion without reported diagnosis   . Coronary artery disease   . Hyperlipidemia   . Hypertension   . Iron deficiency anemia 01/15/2019  . 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 Psoriatic arthritis ([place])   . Stroke [facility]) 2014    Past Surgical History:  Procedure Laterality Date  . CARPAL TUNNEL RELEASE     B/L hand  . CHOLECYSTECTOMY    . FOOT SURGERY     with hardware   . HERNIA REPAIR    . KNEE ARTHROSCOPY Bilateral 08/2014   [facility] Dr. Len Childs  . TEE WITHOUT CARDIOVERSION  01/22/2012   Procedure: TRANSESOPHAGEAL ECHOCARDIOGRAM (TEE);  Surgeon: Lelon Perla, MD;  Location: [facility] ENDOSCOPY;  Service: Cardiovascular;  Laterality: N/A;  . TOTAL HIP ARTHROPLASTY Bilateral     Social History:  reports that she has never smoked. She has never used smokeless tobacco. She reports current alcohol  use. She reports that she does not use drugs.   Allergies  Allergen Reactions  . Nsaids Other (See Comments)    stroke CVA   . Benzoin Dermatitis and Other (See Comments)    Localized - Skin Red with Burning Sensation Skin turns red and gets inflamed     Family History  Problem Relation Age of Onset  . Hypertension Mother   . Hypertension Father   . Colon cancer Maternal Grandfather   . Colon cancer Paternal Grandfather   . Colon polyps Neg Hx   . Esophageal cancer Neg Hx   . Rectal cancer Neg Hx   . Stomach cancer Neg Hx      Prior to Admission medications   Medication Sig Start Date End Date Taking? Authorizing Provider  Abatacept (ORENCIA IV) Inject into the vein. Psoriatic arthristis every 4 weeks   Yes [provider]  aspirin 325 MG EC tablet Take 325 mg by mouth daily.    Yes [provider]  atorvastatin (LIPITOR) 40 MG tablet Take 40 mg by mouth daily. 07/06/19  Yes [provider]  Buprenorphine HCl (BELBUCA) 450 MCG FILM Place 450 mcg inside cheek in the morning and at bedtime.  09/04/19  Yes [provider]  escitalopram (LEXAPRO) 20 MG  tablet Take 20 mg by mouth daily.   Yes [provider]  furosemide (LASIX) 20 MG tablet Take 20 mg by mouth daily.   Yes [provider]  hydrochlorothiazide (MICROZIDE) 12.5 MG capsule Take 12.5 mg by mouth daily.   Yes [provider]  losartan (COZAAR) 100 MG tablet Take 100 mg by mouth daily. 07/04/19  Yes [provider]  pantoprazole (PROTONIX) 40 MG tablet Take 40 mg by mouth daily as needed (indigestion).  09/14/19  Yes [provider]  potassium chloride SA (K-DUR) 20 MEQ tablet Take 40 mEq by mouth daily.    Yes [provider]  pregabalin (LYRICA) 75 MG capsule Take 150 mg by mouth 3 (three) times daily.    Yes [provider]  albuterol (VENTOLIN HFA) 108 (90 Base) MCG/ACT inhaler Inhale 1-2 puffs into the lungs every 4 (four)  hours as needed for wheezing or shortness of breath. Patient not taking: Reported on 11/19/2019 08/17/19   Eugenie Filler, MD  metoCLOPramide (REGLAN) 5 MG tablet Take 1 tablet (5 mg total) by mouth as directed. Patient not taking: Reported on 11/19/2019 10/23/19   Doran Stabler, MD  predniSONE (DELTASONE) 20 MG tablet Take 1-3 tablets (20-60 mg total) by mouth daily before breakfast. Take 3 tablets (59m) daily x 3 days, then 2 tablets (459m daily x 3 days, then 1 tablet (2043mdaily x 3 days then stop. Patient not taking: Reported on 11/19/2019 08/18/19   ThoEugenie FillerD  traZODone (DESYREL) 50 MG tablet TAKE 1 TABLET BY MOUTH ONE HOUR BEFORE BEDTIME 09/09/19 09/22/19  [provider]     Objective    Physical Exam: Vitals:   11/19/19 2045 11/19/19 2104 11/19/19 2105 11/19/19 2115  BP: 117/85 113/68  116/68  Pulse: 70 71 66 73  Resp: '16 17 14 17  ' Temp:      TempSrc:      SpO2: 93% 93% 95% 94%    General: appears to be stated age; alert, oriented Skin: warm, dry, no rash Head:  AT/[place] Eyes:  PEARL b/l, EOMI Mouth:  Oral mucosa membranes appear dry, normal dentition Neck: supple; trachea midline Heart:  RRR; did not appreciate any M/R/G Lungs: CTAB, did not appreciate any wheezes, rales, or rhonchi Abdomen: + BS; soft, ND; mild diffuse tenderness to palpation in the absence of any evidence of guarding, rigidity, or rebound tenderness. Vascular: 2+ pedal pulses b/l; 2+ radial pulses b/l Extremities: no peripheral edema, no muscle wasting Neuro: strength and sensation intact in upper and lower extremities b/l   Labs on Admission: I have personally reviewed following labs and imaging studies  CBC: Recent Labs  Lab 11/18/19 0946 11/19/19 1747  WBC 14.2* 12.8*  NEUTROABS 11.2* 9.8*  HGB 15.3* 13.3  HCT 45.8 40.1  MCV 88.9 92.2  PLT 521* [id number] Basic Metabolic Panel: Recent Labs  Lab 11/18/19 0946 11/19/19 1747 11/19/19 1955  NA 135 131*  --   K  3.6 3.0*  --   CL 98 91*  --   CO2 23 24  --   GLUCOSE 109* 102*  --   BUN 13 12  --   CREATININE 0.73 1.17*  --   CALCIUM 8.8* 8.0*  --   MG  --   --  2.1   GFR: Estimated Creatinine Clearance: 65 mL/min (A) (by C-G formula based on SCr of 1.17 mg/dL (H)). Liver Function Tests: Recent Labs  Lab 11/18/19 0946 11/19/19 1747  AST 22 526*  ALT 27 189*  ALKPHOS 137* 156*  BILITOT 0.4 1.0  PROT 7.7 6.4*  ALBUMIN 4.4 3.7   Recent Labs  Lab 11/19/19 1747  LIPASE 42   No results for input(s): AMMONIA in the last 168 hours. Coagulation Profile: Recent Labs  Lab 11/19/19 1747  INR 1.0   Cardiac Enzymes: No results for input(s): CKTOTAL, CKMB, CKMBINDEX, TROPONINI in the last 168 hours. BNP (last 3 results) No results for input(s): PROBNP in the last 8760 hours. HbA1C: No results for input(s): HGBA1C in the last 72 hours. CBG: No results for input(s): GLUCAP in the last 168 hours. Lipid Profile: No results for input(s): CHOL, HDL, LDLCALC, TRIG, CHOLHDL, LDLDIRECT in the last 72 hours. Thyroid Function Tests: No results for input(s): TSH, T4TOTAL, FREET4, T3FREE, THYROIDAB in the last 72 hours. Anemia Panel: Recent Labs    11/18/19 0946 11/18/19 0947  FERRITIN 85  --   TIBC 389  --   IRON 88  --   RETICCTPCT  --  1.5   Urine analysis:    Component Value Date/Time   COLORURINE YELLOW 11/19/2019 1747   APPEARANCEUR HAZY (A) 11/19/2019 1747   LABSPEC 1.013 11/19/2019 1747   PHURINE 5.0 11/19/2019 1747   GLUCOSEU NEGATIVE 11/19/2019 1747   HGBUR NEGATIVE 11/19/2019 1747   BILIRUBINUR NEGATIVE 11/19/2019 1747   KETONESUR NEGATIVE 11/19/2019 1747   PROTEINUR 100 (A) 11/19/2019 1747   UROBILINOGEN 0.2 07/17/2014 0105   NITRITE NEGATIVE 11/19/2019 1747   LEUKOCYTESUR NEGATIVE 11/19/2019 1747    Radiological Exams on Admission: CT ABDOMEN PELVIS WO CONTRAST  Result Date: 11/19/2019 CLINICAL DATA:  Abdominal distension. Nausea, vomiting, diarrhea. Near syncope.  EXAM: CT ABDOMEN AND PELVIS WITHOUT CONTRAST TECHNIQUE: Multidetector CT imaging of the abdomen and pelvis was performed following the standard protocol without IV contrast. COMPARISON:  CT 12/29/2018 FINDINGS: Lower chest: Minor lung base atelectasis. Mild distal esophageal wall thickening. Hepatobiliary: Enlarged liver spanning 23 cm cranial caudal. No evidence of focal lesion on noncontrast exam. Clips in the gallbladder fossa postcholecystectomy. No biliary dilatation. Pancreas: No ductal dilatation or inflammation. Spleen: Normal in size without focal abnormality. Adrenals/Urinary Tract: Normal adrenal glands. Mild right hydronephrosis and hydroureter. No evidence of renal or ureteral stone. No perinephric edema. Minimal prominence of the left renal collecting system and ureter. No left urolithiasis. Distended urinary bladder. No obvious bladder wall thickening. There is streak artifact from bilateral hip prostheses. Stomach/Bowel: Mild distal esophageal wall thickening. Fluid/ingested material in the stomach. No obvious gastric wall thickening. Fluid-filled small bowel in the central abdomen that is nondilated or inflamed. Cecum is located in the central pelvis. Portions of normal appendix are visualized coursing into the right lower quadrant. No appendicitis. Stool in the cecum and proximal ascending colon. Remainder of the colon is decompressed. No liquid stool or pericolonic edema. Vascular/Lymphatic: Normal caliber abdominal aorta. Small retroperitoneal lymph nodes not enlarged by size criteria. No bulky abdominopelvic adenopathy. Reproductive: Bilateral tubal ligation clips. Uterus is unremarkable. No evidence of adnexal mass. Other: Ascites. No free air. Small fat containing umbilical hernia. Small fat containing supraumbilical ventral abdominal wall hernia. Musculoskeletal: Multilevel degenerative change throughout the spine with Modic endplate changes. Bilateral hip arthroplasties. Lucency about the  left femoral stem is partially included, but chronic and seen on prior exam. IMPRESSION: 1. Mild right hydronephrosis and hydroureter without obstructing stone. This may be secondary to distended urinary bladder. 2. Fluid-filled small bowel which are nondilated or inflamed, can be seen with enteritis or  normal p.o. ingestion. 3. Mild distal esophageal wall thickening, can be seen with reflux or esophagitis. 4. Hepatomegaly. Electronically Signed   By: Keith Rake M.D.   On: 11/19/2019 19:12   CT Head Wo Contrast  Result Date: 11/19/2019 CLINICAL DATA:  Headache.  Trauma. EXAM: CT HEAD WITHOUT CONTRAST TECHNIQUE: Contiguous axial images were obtained from the base of the skull through the vertex without intravenous contrast. COMPARISON:  07/17/2014 FINDINGS: Brain: No evidence of acute infarction, hemorrhage, hydrocephalus, extra-axial collection or mass lesion/mass effect. Vascular: No hyperdense vessel or unexpected calcification. Skull: Normal. Negative for fracture or focal lesion. Sinuses/Orbits: No acute finding. Other: None. IMPRESSION: No acute intracranial abnormality. Electronically Signed   By: Constance Holster M.D.   On: 11/19/2019 19:08   DG Chest Portable 1 View  Result Date: 11/19/2019 CLINICAL DATA:  Emesis EXAM: PORTABLE CHEST 1 VIEW COMPARISON:  08/14/2019 FINDINGS: The heart size and mediastinal contours are within normal limits. Both lungs are clear. The visualized skeletal structures are unremarkable. IMPRESSION: No active disease. Electronically Signed   By: Donavan Foil M.D.   On: 11/19/2019 19:55   US Abdomen Limited RUQ  Result Date: 11/19/2019 CLINICAL DATA:  49 year old female with elevated LFTs. Prior cholecystectomy. EXAM: ULTRASOUND ABDOMEN LIMITED RIGHT UPPER QUADRANT COMPARISON:  Right upper quadrant ultrasound dated 02/16/2019 and CT abdomen pelvis dated 11/19/2019. FINDINGS: Gallbladder: Cholecystectomy. Common bile duct: Diameter: 7 mm Liver: The liver  demonstrates a normal echogenicity. There is enlargement of the left lobe of the liver which may represent a Reidel lobe anatomy portal vein is patent on color Doppler imaging with normal direction of blood flow towards the liver. Other: None. IMPRESSION: 1. Cholecystectomy. 2. Enlargement of the right lobe of the liver. Clinical correlation is recommended. 3. Patent main portal vein with hepatopetal flow.  Or Electronically Signed   By: Anner Crete M.D.   On: 11/19/2019 19:47     EKG: Independently reviewed, with result as described above.    Assessment/Plan   WESLYN HOLSONBACK is a 49 y.o. female with medical history significant for psoriatic arthritis on chronic immunosuppression, chronic iron deficiency anemia on intermittent IV iron transfusions, hypertension, who is admitted to [facility] on 11/19/2019 with acute kidney injury after presenting from home to [facility] Emergency Department complaining of nausea vomiting.    Principal Problem:   AKI (acute kidney injury) ([place]) Active Problems:   Hypokalemia   Nausea and vomiting   Syncope   Dehydration   Transaminitis   Lactic acidosis   Acute hyponatremia    #) Acute kidney injury: Presenting serum creatinine noted to be 1.17 relative to most recent prior value of 0.73 on 11/18/2019.  This appears to be prerenal in the setting of intravascular depletion stemming from 3 to 4 days of several episodes of nausea/vomiting as well as associated diminished po intake over that time, further confounded by continued compliance with multiple outpatient diuretic medications, including Lasix and HCTZ.  Presenting urinalysis is notable for the presence of hyaline casts, consistent with a picture of dehydration, as well as 100 protein.  Further pharmacologic exacerbation may stem from home losartan use.  Status post 2 L IV normal saline bolus in the ED, followed by transition to maintenance normal saline at 125 cc/h.  Plan:  Change IV fluids to lactated Ringer's running at 100 cc/h.  Monitor strict I's and O's and daily weights.  Attempt to avoid nephrotoxic agents.  Hold home losartan, HCTZ, Lasix.  Add on random  urine sodium as well as random urine creatinine to previously collected urine sample.  Repeat BMP in the morning.  Work-up and management of presenting nausea/vomiting, as further described below.  Check CPK level in the context of concomitant presenting metabolic acidosis associated with elevated lactic acid.     #) Enteritis: Diagnosis on the basis of 3 to 4 days of nausea/vomiting as well as generalized abdominal discomfort, with CT abdomen/pelvis showing nondilated, noninflamed fluid-filled small bowel consistent with enteritis.  Suspect that this is viral in nature, and does not require initiation of antibiotics.  Of note, SIRS criteria are not met for the patient to be considered septic at this time.  CT abdomen/pelvis showed no evidence of bowel perforation or abscess.  Viral enteritis should be self-limited, I will provide supportive measures in the meantime, including IV fluids as well as IV antiemetics.   Plan: IV fluids as above.  As needed IV Ativan for nausea, which was chosen given presenting prolonged QTC.  Repeat CMP in the morning.     #) Syncope: A single episode of syncope that occurred earlier on the day of admission when the patient attempted to quickly rise from a supine to standing position after laying in bed for prolonged period of time, which appears consistent with an episode of orthostatic hypotension in the setting of dehydration, with pharmacologic exacerbation from diminished compensatory peripheral vasoconstriction due to losartan.  Clinically, patient syncope appears less likely to be cardiac in origin, and presenting EKG shows sinus rhythm with no evidence of acute ischemic changes.  The patient denies any recent chest discomfort, and ACS is felt to be much less likely relative to  the above.   Plan: I placed an order requesting that orthostatic vital signs x1 set be checked and recorded at this time, although it is acknowledged that the patient has already received IV fluid, potentially diminishing the legitimacy of the ensuing orthostatic vital signs.  Monitor on telemetry.  Work-up and management of dehydration in the setting of presenting nausea/vomiting, as further described above.  As such, will hold home HCTZ as well as Lasix, and provide additional IV fluids, as further outlined above.  Hold home losartan for now as well.      #) Lactic acidosis: Initial lactic acid level found to be elevated at 5.0, which subsequently improved to 4.0 following interval IV fluid administration, as further described below.  Suspect the patient's lactic acidosis is multifactorial in etiology, with contributions from presenting acute kidney injury as well as suspected starvation keto lactic acidosis in the setting of being unable to tolerate any p.o. intake over the last 3 to 4 days.  There may also be a contribution from generalized peripheral hypoperfusion given hypotensive presentation.  Of note, presenting lactic acidosis, does not appear to be consistent with underlying sepsis.   Plan: IV fluids, as above, repeat lactic acid level in the morning.  Check CPK.  Work-up and management of presenting AKI, as above.  Hold home antihypertensive medications for now.  Repeat BMP in the morning.     #) Hypokalemia: Present labs reflect serum potassium of 3.0, which appears to be on the basis of increased GI losses over the last 3 to 4 days concomitant with diminished oral intake over that time.  Of note, presenting serum magnesium level found to be within normal limits at 2.1.  The patient is in the process of receiving 40 mEq of IV potassium in the ED.   Plan: Repeat BMP in the  morning at which time we will also repeat serum magnesium level.  Monitor on telemetry.  Work-up and management of  presenting nausea/vomiting, as above.  Hold home Lasix for now.      #) Acute transaminitis: Present labs reflect acute transaminitis and a noncholestatic pattern, which may be viral in nature given suspected presenting viral gastroenteritis, although it is noted that the ratio of transaminitis does not favor the standard ALT predominant ratio that would be typically associated with a viral process.  There may also be a contribution from shock liver given presenting hypotension, as further described above, although typically shock liver would be associated with much higher transaminases.  Hepatotoxins are also in the differential, will check urinary drug screen to further evaluate this possibility.  Acute viral hepatitis panel was ordered in the ED, with results currently pending.  This evening CT of the abdomen/pelvis confirmed no evidence of choledocholithiasis.  Presenting INR reflects preservation of synthetic function of liver.  Plan: Check urinary drug screen.  Follow-up results of acute viral hepatitis panel will also follow for result of COVID-19 PCR.  Repeat CMP in the morning.  Repeat INR in the morning.      #) Acute hyponatremia: Presenting labs reflect serum potassium of 131 relative to most recent prior value 135 on 11/18/2019.  Suspect that this is most consistent with acute hypervolemic hypoosmolar hyponatremia in the setting of intravascular depletion from diminished oral intake as well as increased GI losses over the last few days, and further exacerbated by outpatient HCTZ, is further evidenced by hypochloremia on presenting labs.  Will reduce rate of maintenance IV fluids so as to prevent rapid correction of presenting hyponatremia.  Plan: Check random urine sodium as well as urine osmolality.  Check TSH.  Hold home HCTZ.  IV fluids as above.  Repeat BMP in the morning.  Monitor strict I's and O's and daily weights.       #) Chronic iron deficiency anemia: Associated with  baseline hemoglobin range of 8-10.  Per GI consultation as well as endoscopic evaluation in early March 2021, this is believed to be on the basis of diminished GI absorption of iron.  The patient follows with outpatient heme-onc, and undergoes intermittent IV iron infusions.  Presenting hemoglobin found to be slightly higher than baseline range, likely suggestive of hemoconcentrated factors in the setting of presenting dehydration.  No evidence of acute bleed at this time.  Plan: Repeat CBC in the morning.     DVT prophylaxis: Lovenox 40 mg [place] qday  Code Status: Full code Family Communication: none Disposition Plan: Per Rounding Team Consults called: none  Admission status: inpatient; med-tele.    PLEASE NOTE THAT DRAGON DICTATION SOFTWARE WAS USED IN THE CONSTRUCTION OF THIS NOTE.   [facility] Pager [phone] From [place].   Otherwise, please contact night-coverage  [email or web address] Password [facility]  11/19/2019, 9:31 PM

## 2019-11-19 NOTE — ED Notes (Signed)
Pt transported to CT at this time.

## 2019-11-19 NOTE — ED Notes (Signed)
Date and time results received: 11/19/19 7:06 PM (use smartphrase ".now" to insert current time)  Test: Lactic Critical Value: 5.0  Name of Provider Notified: [place]  Orders Received? Or Actions Taken?: Orders Received - See Orders for details

## 2019-11-19 NOTE — ED Notes (Signed)
Provided pt with mouth swabs since still NPO.

## 2019-11-19 NOTE — ED Provider Notes (Signed)
[facility] DEPT Provider Note   CSN: [id number] Arrival date & time: 11/19/19  1720     History Chief Complaint  Patient presents with  . Emesis  . Hypotension    [name] [name] is a 49 y.o. female.  The history is provided by the patient.  Abdominal Pain Pain location:  Generalized Pain quality: aching and cramping   Pain radiates to:  Does not radiate Pain severity:  Mild Onset quality:  Gradual Duration:  3 days Timing:  Constant Progression:  Unchanged Chronicity:  Recurrent Context: previous surgery   Context: not alcohol use, not diet changes, not eating and not suspicious food intake   Context comment:  Thinks having bad IBS flare. Relieved by:  Nothing Worsened by:  Vomiting Associated symptoms: nausea and vomiting   Associated symptoms: no anorexia, no chest pain, no chills, no cough, no diarrhea, no dysuria, no fever, no flatus, no hematuria, no shortness of breath, no sore throat, no vaginal bleeding and no vaginal discharge   Risk factors: multiple surgeries        Past Medical History:  Diagnosis Date  . Anxiety   . Blood transfusion without reported diagnosis   . Coronary artery disease   . Hyperlipidemia   . Hypertension   . Iron deficiency anemia 01/15/2019  . 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 Psoriatic arthritis ([place])   . Stroke [facility]) 2014    Patient Active Problem List   Diagnosis Date Noted  . Bone spur of right foot 09/01/2019  . Pain from implanted hardware 09/01/2019  . Acquired pes planovalgus of right foot 09/01/2019  . Person under investigation for COVID-19   . Acute respiratory failure with hypoxia ([place]) 08/12/2019  . COVID-19 virus infection: Presumed 08/12/2019  . Community acquired pneumonia 08/11/2019  . Multifocal pneumonia 08/10/2019  . Iron deficiency anemia 01/15/2019  . B12 deficiency 01/01/2019  .  Immunosuppressed status ([place]) 12/31/2018  . Myoclonic jerking 12/29/2018  . Generalized anxiety disorder 08/04/2018  . Nontraumatic complete tear of right rotator cuff 07/08/2018  . Obesity with serious comorbidity 01/17/2018  . Normochromic normocytic anemia 12/24/2017  . 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 Right leg swelling 02/21/2017  . Asthmatic bronchitis with acute exacerbation 02/15/2017  . Noncompliance with treatment plan 12/27/2016  . Synovitis of right foot 12/04/2016  . Ganglion of foot, right 11/09/2016  . Hypokalemia 10/12/2016  . Hyperglycemia 10/11/2016  . IFG (impaired fasting glucose) 10/11/2016  . Lumbar trigger point syndrome 03/13/2016  . Pap smear for cervical cancer screening 02/17/2016  . Elevated liver enzymes 01/27/2016  . Long term (current) use of antibiotics 01/27/2016  . Cellulitis of left lower limb 01/03/2016  . Wound dehiscence 01/03/2016  . Loose total hip arthroplasty ([place]) 12/20/2015  . Foot pain, right 09/09/2015  . Screening for breast cancer 02/16/2015  . Left axillary hidradenitis 02/16/2015  . Epidermal inclusion cyst 01/11/2015  . Urticaria 06/22/2014  . Abnormal finding of blood chemistry 02/23/2014  . Benign essential hypertension 02/23/2014  . Brain neoplasm ([place]) 02/23/2014  . 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 Edema 02/23/2014  . Elevated blood-pressure reading without diagnosis of hypertension 02/23/2014  . Elevated WBC count 02/23/2014  . Hearing loss of both ears 02/23/2014  . High risk medication use 02/23/2014  . Hip joint painful  on movement 02/23/2014  . Mixed hyperlipidemia 02/23/2014  . Dislocation of right patella 02/23/2014  . Irritable bowel syndrome 02/23/2014  . Primary osteoarthritis involving multiple joints 02/23/2014  . Sudden idiopathic hearing loss 02/23/2014  . Primary osteoarthritis of left  knee 02/23/2014  . Primary osteoarthritis of right knee 02/23/2014  . Osteoarthritis 02/23/2014  . Internal derangement of left knee 02/23/2014  . Pain in joint, pelvic region and thigh 02/23/2014  . Pain in joint, ankle and foot 02/23/2014  . Coccydynia 02/23/2014  . Right-sided low back pain with sciatica 02/19/2014  . 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 Fall at home 10/04/2012  . Acute ischemic stroke ([place]) 01/19/2012  . Hypertension 01/19/2012  . Anxiety 01/19/2012  . Chronic knee pain 01/19/2012  . Restless legs syndrome 09/05/2010  . HYPERSOMNIA 06/04/2010    Past Surgical History:  Procedure Laterality Date  . CARPAL TUNNEL RELEASE     B/L hand  . CHOLECYSTECTOMY    . FOOT SURGERY     with hardware   . HERNIA REPAIR    . KNEE ARTHROSCOPY Bilateral 08/2014   [facility] Dr. Len Childs  . TEE WITHOUT CARDIOVERSION  01/22/2012   Procedure: TRANSESOPHAGEAL ECHOCARDIOGRAM (TEE);  Surgeon: Lelon Perla, MD;  Location: [facility] ENDOSCOPY;  Service: Cardiovascular;  Laterality: N/A;  . TOTAL HIP ARTHROPLASTY Bilateral      OB History   No obstetric history on file.     Family History  Problem Relation Age of Onset  . Hypertension Mother   . Hypertension Father   . Colon cancer Maternal Grandfather   . Colon cancer Paternal Grandfather   . Colon polyps Neg Hx   . Esophageal cancer Neg Hx   . Rectal cancer Neg Hx   . Stomach cancer Neg Hx     Social History   Tobacco Use  . Smoking status: Never Smoker  . Smokeless tobacco: Never Used  . Tobacco comment: quit 10 years ago, smoked intermittently for few years. Less than 10 yrs total.  Substance Use Topics  . Alcohol use: Yes    Comment: occ- 1-2 drinks a week  . Drug use: No    Home Medications Prior to Admission medications   Medication Sig Start Date End Date Taking? Authorizing Provider  Abatacept (ORENCIA IV) Inject into the vein. Psoriatic arthristis every 4 weeks   Yes [provider]    aspirin 325 MG EC tablet Take 325 mg by mouth daily.    Yes [provider]  atorvastatin (LIPITOR) 40 MG tablet Take 40 mg by mouth daily. 07/06/19  Yes [provider]  Buprenorphine HCl (BELBUCA) 450 MCG FILM Place 450 mcg inside cheek in the morning and at bedtime.  09/04/19  Yes [provider]  escitalopram (LEXAPRO) 20 MG tablet Take 20 mg by mouth daily.   Yes [provider]  furosemide (LASIX) 20 MG tablet Take 20 mg by mouth daily.   Yes [provider]  hydrochlorothiazide (MICROZIDE) 12.5 MG capsule Take 12.5 mg by mouth daily.   Yes [provider]  losartan (COZAAR) 100 MG tablet Take 100 mg by mouth daily. 07/04/19  Yes [provider]  pantoprazole (PROTONIX) 40 MG tablet Take 40 mg by mouth daily as needed (indigestion).  09/14/19  Yes [provider]  potassium chloride SA (K-DUR) 20 MEQ tablet Take 40 mEq by mouth daily.    Yes [provider]  pregabalin (LYRICA) 75 MG capsule Take 150 mg by mouth 3 (  three) times daily.    Yes [provider]  albuterol (VENTOLIN HFA) 108 (90 Base) MCG/ACT inhaler Inhale 1-2 puffs into the lungs every 4 (four) hours as needed for wheezing or shortness of breath. Patient not taking: Reported on 11/19/2019 08/17/19   Eugenie Filler, MD  metoCLOPramide (REGLAN) 5 MG tablet Take 1 tablet (5 mg total) by mouth as directed. Patient not taking: Reported on 11/19/2019 10/23/19   Doran Stabler, MD  predniSONE (DELTASONE) 20 MG tablet Take 1-3 tablets (20-60 mg total) by mouth daily before breakfast. Take 3 tablets (22m) daily x 3 days, then 2 tablets (462m daily x 3 days, then 1 tablet (2045mdaily x 3 days then stop. Patient not taking: Reported on 11/19/2019 08/18/19   ThoEugenie FillerD  traZODone (DES[place]0 MG tablet TAKE 1 TABLET BY MOUTH ONE HOUR BEFORE BEDTIME 09/09/19 09/22/19  [provider]    Allergies    Nsaids and Benzoin  Review  of Systems   Review of Systems  Constitutional: Negative for chills and fever.  HENT: Negative for ear pain and sore throat.   Eyes: Negative for pain and visual disturbance.  Respiratory: Negative for cough and shortness of breath.   Cardiovascular: Negative for chest pain and palpitations.  Gastrointestinal: Positive for abdominal pain, nausea and vomiting. Negative for anorexia, diarrhea and flatus.  Genitourinary: Negative for dysuria, hematuria, vaginal bleeding and vaginal discharge.  Musculoskeletal: Negative for arthralgias and back pain.  Skin: Negative for color change and rash.  Neurological: Negative for seizures and syncope.  All other systems reviewed and are negative.   Physical Exam Updated Vital Signs  ED Triage Vitals  Enc Vitals Group     BP 11/19/19 1733 (!) 99/55     Pulse Rate 11/19/19 1733 80     Resp 11/19/19 1733 18     Temp 11/19/19 1752 98.1 F (36.7 C)     Temp Source 11/19/19 1752 Oral     SpO2 11/19/19 1732 98 %     Weight --      Height --      Head Circumference --      Peak Flow --      Pain Score 11/19/19 1734 5     Pain Loc --      Pain Edu? --      Excl. in GC?[place]-     Physical Exam Vitals and nursing note reviewed.  Constitutional:      General: She is not in acute distress.    Appearance: She is well-developed. She is not ill-appearing.  HENT:     Head: Normocephalic and atraumatic.     Nose: Nose normal.     Mouth/Throat:     Mouth: Mucous membranes are dry.  Eyes:     Extraocular Movements: Extraocular movements intact.     Conjunctiva/sclera: Conjunctivae normal.     Pupils: Pupils are equal, round, and reactive to light.  Cardiovascular:     Rate and Rhythm: Normal rate and regular rhythm.     Pulses: Normal pulses.     Heart sounds: Normal heart sounds. No murmur.  Pulmonary:     Effort: Pulmonary effort is normal. No respiratory distress.     Breath sounds: Normal breath sounds.  Abdominal:     Palpations: Abdomen  is soft.     Tenderness: There is abdominal tenderness (diffuse).  Musculoskeletal:     Cervical back: Normal range of motion and neck supple.  Skin:    General: Skin is warm and dry.     Capillary Refill: Capillary refill takes less than 2 seconds.  Neurological:     General: No focal deficit present.     Mental Status: She is alert.  Psychiatric:        Mood and Affect: Mood normal.     ED Results / Procedures / Treatments   Labs (all labs ordered are listed, but only abnormal results are displayed) Labs Reviewed  CBC WITH DIFFERENTIAL/PLATELET - Abnormal; Notable for the following components:      Result Value   WBC 12.8 (*)    RDW 18.3 (*)    Platelets 402 (*)    Neutro Abs 9.8 (*)    Monocytes Absolute 1.2 (*)    Abs Immature Granulocytes 0.09 (*)    All other components within normal limits  COMPREHENSIVE METABOLIC PANEL - Abnormal; Notable for the following components:   Sodium 131 (*)    Potassium 3.0 (*)    Chloride 91 (*)    Glucose, Bld 102 (*)    Creatinine, Ser 1.17 (*)    Calcium 8.0 (*)    Total Protein 6.4 (*)    AST 526 (*)    ALT 189 (*)    Alkaline Phosphatase 156 (*)    GFR calc non Af Amer 55 (*)    Anion gap 16 (*)    All other components within normal limits  URINALYSIS, ROUTINE W REFLEX MICROSCOPIC - Abnormal; Notable for the following components:   APPearance HAZY (*)    Protein, ur 100 (*)    Bacteria, UA RARE (*)    All other components within normal limits  LACTIC ACID, PLASMA - Abnormal; Notable for the following components:   Lactic Acid, Venous 5.0 (*)    All other components within normal limits  LACTIC ACID, PLASMA - Abnormal; Notable for the following components:   Lactic Acid, Venous 4.0 (*)    All other components within normal limits  ACETAMINOPHEN LEVEL - Abnormal; Notable for the following components:   Acetaminophen (Tylenol), Serum <10 (*)    All other components within normal limits  URINE CULTURE  CULTURE, BLOOD  (ROUTINE X 2)  CULTURE, BLOOD (ROUTINE X 2)  SARS CORONAVIRUS 2 (TAT 6-24 HRS)  LIPASE, BLOOD  MAGNESIUM  PROTIME-INR  HEPATITIS PANEL, ACUTE  POC URINE PREG, ED    EKG EKG Interpretation  Date/Time:  Thursday November 19 2019 17:45:07 EDT Ventricular Rate:  77 PR Interval:    QRS Duration: 88 QT Interval:  455 QTC Calculation: 515 R Axis:   60 Text Interpretation: Sinus rhythm Prolonged QT interval Confirmed by Lennice Sites [phone]) on 11/19/2019 6:06:18 PM   Radiology CT ABDOMEN PELVIS WO CONTRAST  Result Date: 11/19/2019 CLINICAL DATA:  Abdominal distension. Nausea, vomiting, diarrhea. Near syncope. EXAM: CT ABDOMEN AND PELVIS WITHOUT CONTRAST TECHNIQUE: Multidetector CT imaging of the abdomen and pelvis was performed following the standard protocol without IV contrast. COMPARISON:  CT 12/29/2018 FINDINGS: Lower chest: Minor lung base atelectasis. Mild distal esophageal wall thickening. Hepatobiliary: Enlarged liver spanning 23 cm cranial caudal. No evidence of focal lesion on noncontrast exam. Clips in the gallbladder fossa postcholecystectomy. No biliary dilatation. Pancreas: No ductal dilatation or inflammation. Spleen: Normal in size without focal abnormality. Adrenals/Urinary Tract: Normal adrenal glands. Mild right hydronephrosis and hydroureter. No evidence of renal or ureteral stone. No perinephric edema. Minimal prominence of the left renal collecting system and ureter. No left urolithiasis. Distended urinary  bladder. No obvious bladder wall thickening. There is streak artifact from bilateral hip prostheses. Stomach/Bowel: Mild distal esophageal wall thickening. Fluid/ingested material in the stomach. No obvious gastric wall thickening. Fluid-filled small bowel in the central abdomen that is nondilated or inflamed. Cecum is located in the central pelvis. Portions of normal appendix are visualized coursing into the right lower quadrant. No appendicitis. Stool in the cecum and  proximal ascending colon. Remainder of the colon is decompressed. No liquid stool or pericolonic edema. Vascular/Lymphatic: Normal caliber abdominal aorta. Small retroperitoneal lymph nodes not enlarged by size criteria. No bulky abdominopelvic adenopathy. Reproductive: Bilateral tubal ligation clips. Uterus is unremarkable. No evidence of adnexal mass. Other: Ascites. No free air. Small fat containing umbilical hernia. Small fat containing supraumbilical ventral abdominal wall hernia. Musculoskeletal: Multilevel degenerative change throughout the spine with Modic endplate changes. Bilateral hip arthroplasties. Lucency about the left femoral stem is partially included, but chronic and seen on prior exam. IMPRESSION: 1. Mild right hydronephrosis and hydroureter without obstructing stone. This may be secondary to distended urinary bladder. 2. Fluid-filled small bowel which are nondilated or inflamed, can be seen with enteritis or normal p.o. ingestion. 3. Mild distal esophageal wall thickening, can be seen with reflux or esophagitis. 4. Hepatomegaly. Electronically Signed   By: Keith Rake M.D.   On: 11/19/2019 19:12   CT Head Wo Contrast  Result Date: 11/19/2019 CLINICAL DATA:  Headache.  Trauma. EXAM: CT HEAD WITHOUT CONTRAST TECHNIQUE: Contiguous axial images were obtained from the base of the skull through the vertex without intravenous contrast. COMPARISON:  07/17/2014 FINDINGS: Brain: No evidence of acute infarction, hemorrhage, hydrocephalus, extra-axial collection or mass lesion/mass effect. Vascular: No hyperdense vessel or unexpected calcification. Skull: Normal. Negative for fracture or focal lesion. Sinuses/Orbits: No acute finding. Other: None. IMPRESSION: No acute intracranial abnormality. Electronically Signed   By: Constance Holster M.D.   On: 11/19/2019 19:08   DG Chest Portable 1 View  Result Date: 11/19/2019 CLINICAL DATA:  Emesis EXAM: PORTABLE CHEST 1 VIEW COMPARISON:  08/14/2019  FINDINGS: The heart size and mediastinal contours are within normal limits. Both lungs are clear. The visualized skeletal structures are unremarkable. IMPRESSION: No active disease. Electronically Signed   By: Donavan Foil M.D.   On: 11/19/2019 19:55   US Abdomen Limited RUQ  Result Date: 11/19/2019 CLINICAL DATA:  49 year old female with elevated LFTs. Prior cholecystectomy. EXAM: ULTRASOUND ABDOMEN LIMITED RIGHT UPPER QUADRANT COMPARISON:  Right upper quadrant ultrasound dated 02/16/2019 and CT abdomen pelvis dated 11/19/2019. FINDINGS: Gallbladder: Cholecystectomy. Common bile duct: Diameter: 7 mm Liver: The liver demonstrates a normal echogenicity. There is enlargement of the left lobe of the liver which may represent a Reidel lobe anatomy portal vein is patent on color Doppler imaging with normal direction of blood flow towards the liver. Other: None. IMPRESSION: 1. Cholecystectomy. 2. Enlargement of the right lobe of the liver. Clinical correlation is recommended. 3. Patent main portal vein with hepatopetal flow.  Or Electronically Signed   By: Anner Crete M.D.   On: 11/19/2019 19:47    Procedures .Critical Care Performed by: Lennice Sites, DO Authorized by: Lennice Sites, DO   Critical care provider statement:    Critical care time (minutes):  35   Critical care was necessary to treat or prevent imminent or life-threatening deterioration of the following conditions:  Dehydration   Critical care was time spent personally by me on the following activities:  Blood draw for specimens, development of treatment plan with patient or surrogate, discussions  with primary provider, evaluation of patient's response to treatment, examination of patient, obtaining history from patient or surrogate, ordering and performing treatments and interventions, ordering and review of laboratory studies, ordering and review of radiographic studies, pulse oximetry, re-evaluation of patient's condition and review  of old charts   I assumed direction of critical care for this patient from another provider in my specialty: no     (including critical care time)  Medications Ordered in ED Medications  potassium chloride 10 mEq in 100 mL IVPB (10 mEq Intravenous New Bag/Given 11/19/19 2058)  sodium chloride 0.9 % bolus 2,000 mL (0 mLs Intravenous Stopped 11/19/19 1958)  prochlorperazine (COMPAZINE) injection 10 mg (10 mg Intravenous Given 11/19/19 1802)  diphenhydrAMINE (BENADRYL) injection 25 mg (25 mg Intravenous Given 11/19/19 1802)  sodium chloride 0.9 % bolus 1,000 mL (0 mLs Intravenous Stopped 11/19/19 2059)  LORazepam (ATIVAN) injection 1 mg (1 mg Intravenous Given 11/19/19 2059)  0.9 %  sodium chloride infusion ( Intravenous New Bag/Given 11/19/19 2056)    ED Course  I have reviewed the triage vital signs and the nursing notes.  Pertinent labs & imaging results that were available during my care of the patient were reviewed by me and considered in my medical decision making (see chart for details).    MDM Rules/Calculators/A&P                      HONESTEE REVARD is a 49 year old female with history of hypertension, CAD, anxiety, high cholesterol, IBS who presents to the ED with nausea, vomiting, abdominal pain.  Patient with normal vitals.  No fever.  However is mildly hypotensive with blood pressure in the 90s.  Was hypotensive in the 70s with EMS.  Got 500 cc of fluid.  Patient had syncopal event while standing up.  She has not been able to eat or drink for the last 4 days.  Has had crampy abdominal pain.  Has had her gallbladder removed in the past.  Denies any fever.  Denies any blood in her stool.  She has mildly diffuse abdominal tenderness on exam.  No signs to suggest peritonitis.  She appears clinically dehydrated with dry mucous membranes.  Lab work was significant for lactic acid of 5.  Liver enzymes are elevated at 526 for AST, 189 for ALT, 156 for alk phos.  However bilirubin and lipase  are normal.  Potassium is 3 and was repleted.  Creatinine mildly elevated.  However INR, Tylenol level normal.  Hepatitis panel sent.  CT scan of the abdomen and pelvis was overall unremarkable except for may be some inflammation of the small bowel.  Ultrasound of the right upper quadrant showed enlarged right lobe of liver however no ascites, no cirrhosis changes.  Repeat lactic acid after 2 L IV fluid improved to 4.  Patient given additional normal saline bolus and started on maintenance fluids.  She was given Compazine, Benadryl, Ativan for nausea and vomiting.  At this time no fever.  No concern for infectious process.  No concern for sepsis. No infection on U/a. Suspect severe dehydration in the setting of GI losses.  Hepatitis panel is pending.  No obvious GI dysfunction with normal INR.  Will admit to hospital for further hydration and trending of labs.  Hemodynamically improved throughout my care.  This chart was dictated using voice recognition software.  Despite best efforts to proofread,  errors can occur which can change the documentation meaning.    Final Clinical Impression(s) /  ED Diagnoses Final diagnoses:  Elevated LFTs  Dehydration  Vomiting without nausea, intractability of vomiting not specified, unspecified vomiting type  Lactic acidosis  Hypokalemia    Rx / DC Orders ED Discharge Orders    None       Lennice Sites, DO 11/19/19 2138

## 2019-11-19 NOTE — ED Notes (Signed)
Pt up walking to the bathroom. Denies any needs. Will continue to monitor pt.

## 2019-11-19 NOTE — ED Notes (Signed)
US at bedside

## 2019-11-20 DIAGNOSIS — R55 Syncope and collapse: Secondary | ICD-10-CM

## 2019-11-20 DIAGNOSIS — E876 Hypokalemia: Secondary | ICD-10-CM

## 2019-11-20 DIAGNOSIS — E860 Dehydration: Secondary | ICD-10-CM

## 2019-11-20 DIAGNOSIS — E872 Acidosis, unspecified: Secondary | ICD-10-CM | POA: Diagnosis present

## 2019-11-20 DIAGNOSIS — R7989 Other specified abnormal findings of blood chemistry: Secondary | ICD-10-CM

## 2019-11-20 DIAGNOSIS — E871 Hypo-osmolality and hyponatremia: Secondary | ICD-10-CM

## 2019-11-20 DIAGNOSIS — R112 Nausea with vomiting, unspecified: Secondary | ICD-10-CM

## 2019-11-20 DIAGNOSIS — R7401 Elevation of levels of liver transaminase levels: Secondary | ICD-10-CM | POA: Diagnosis present

## 2019-11-20 LAB — CBC WITH DIFFERENTIAL/PLATELET
Abs Immature Granulocytes: 0.05 10*3/uL (ref 0.00–0.07)
Basophils Absolute: 0 10*3/uL (ref 0.0–0.1)
Basophils Relative: 0 %
Eosinophils Absolute: 0.3 10*3/uL (ref 0.0–0.5)
Eosinophils Relative: 4 %
HCT: 37 % (ref 36.0–46.0)
Hemoglobin: 11.9 g/dL — ABNORMAL LOW (ref 12.0–15.0)
Immature Granulocytes: 1 %
Lymphocytes Relative: 22 %
Lymphs Abs: 1.9 10*3/uL (ref 0.7–4.0)
MCH: 30.5 pg (ref 26.0–34.0)
MCHC: 32.2 g/dL (ref 30.0–36.0)
MCV: 94.9 fL (ref 80.0–100.0)
Monocytes Absolute: 0.5 10*3/uL (ref 0.1–1.0)
Monocytes Relative: 6 %
Neutro Abs: 5.7 10*3/uL (ref 1.7–7.7)
Neutrophils Relative %: 67 %
Platelets: 338 10*3/uL (ref 150–400)
RBC: 3.9 MIL/uL (ref 3.87–5.11)
RDW: 18.5 % — ABNORMAL HIGH (ref 11.5–15.5)
WBC: 8.4 10*3/uL (ref 4.0–10.5)
nRBC: 0 % (ref 0.0–0.2)

## 2019-11-20 LAB — RAPID URINE DRUG SCREEN, HOSP PERFORMED
Amphetamines: NOT DETECTED
Barbiturates: NOT DETECTED
Benzodiazepines: NOT DETECTED
Cocaine: NOT DETECTED
Opiates: NOT DETECTED
Tetrahydrocannabinol: NOT DETECTED

## 2019-11-20 LAB — COMPREHENSIVE METABOLIC PANEL
ALT: 180 U/L — ABNORMAL HIGH (ref 0–44)
AST: 209 U/L — ABNORMAL HIGH (ref 15–41)
Albumin: 3.1 g/dL — ABNORMAL LOW (ref 3.5–5.0)
Alkaline Phosphatase: 141 U/L — ABNORMAL HIGH (ref 38–126)
Anion gap: 8 (ref 5–15)
BUN: 8 mg/dL (ref 6–20)
CO2: 25 mmol/L (ref 22–32)
Calcium: 7.4 mg/dL — ABNORMAL LOW (ref 8.9–10.3)
Chloride: 105 mmol/L (ref 98–111)
Creatinine, Ser: 0.6 mg/dL (ref 0.44–1.00)
GFR calc Af Amer: >60 mL/min (ref >60)
GFR calc non Af Amer: >60 mL/min (ref >60)
Glucose, Bld: 99 mg/dL (ref 70–99)
Potassium: 3.4 mmol/L — ABNORMAL LOW (ref 3.5–5.1)
Sodium: 138 mmol/L (ref 135–145)
Total Bilirubin: 0.6 mg/dL (ref 0.3–1.2)
Total Protein: 5.5 g/dL — ABNORMAL LOW (ref 6.5–8.1)

## 2019-11-20 LAB — GAMMA GT: GGT: 381 U/L — ABNORMAL HIGH (ref 7–50)

## 2019-11-20 LAB — SODIUM, URINE, RANDOM: Sodium, Ur: <10 mmol/L

## 2019-11-20 LAB — C-REACTIVE PROTEIN: CRP: 0.6 mg/dL (ref <1.0)

## 2019-11-20 LAB — SEDIMENTATION RATE: Sed Rate: 2 mm/hr (ref 0–22)

## 2019-11-20 LAB — MAGNESIUM: Magnesium: 2.1 mg/dL (ref 1.7–2.4)

## 2019-11-20 LAB — CK: Total CK: 195 U/L (ref 38–234)

## 2019-11-20 LAB — TSH: TSH: 1.782 u[IU]/mL (ref 0.350–4.500)

## 2019-11-20 LAB — SARS CORONAVIRUS 2 (TAT 6-24 HRS): SARS Coronavirus 2: NEGATIVE

## 2019-11-20 LAB — LACTIC ACID, PLASMA: Lactic Acid, Venous: 1.9 mmol/L (ref 0.5–1.9)

## 2019-11-20 LAB — PROTIME-INR
INR: 1 (ref 0.8–1.2)
Prothrombin Time: 13.5 seconds (ref 11.4–15.2)

## 2019-11-20 MED ORDER — ONDANSETRON HCL 4 MG/2ML IJ SOLN: 4.0000 mg | Freq: Three times a day (TID) | INTRAMUSCULAR | Status: DC | PRN

## 2019-11-20 MED ORDER — KCL-LACTATED RINGERS 20 MEQ/L IV SOLN
INTRAVENOUS | Status: DC
  Filled 2019-11-20: qty 1000

## 2019-11-20 MED ORDER — POTASSIUM CHLORIDE 2 MEQ/ML IV SOLN
INTRAVENOUS | Status: DC
  Filled 2019-11-20 (×3): qty 1000

## 2019-11-20 MED ORDER — ONDANSETRON HCL 4 MG/2ML IJ SOLN
4.0000 mg | INTRAMUSCULAR | Status: DC | PRN
  Administered 2019-11-20: 4 mg via INTRAVENOUS

## 2019-11-20 MED ORDER — ONDANSETRON HCL 4 MG/2ML IJ SOLN
4.0000 mg | Freq: Four times a day (QID) | INTRAMUSCULAR | Status: DC | PRN
  Administered 2019-11-20: 4 mg via INTRAVENOUS
  Filled 2019-11-20 (×2): qty 2

## 2019-11-20 NOTE — ED Notes (Signed)
Please check the Temperature before reporting to [facility].  (Lactic Acid is 4).Thank you

## 2019-11-20 NOTE — ED Notes (Signed)
Pt lying in bed, eye's closed, chest rising and falling. Full monitor on. NAD noted. Will continue to monitor.

## 2019-11-20 NOTE — Progress Notes (Addendum)
PROGRESS NOTE  [name] [name] IWP:[id number] DOB: 02/02/1971 DOA: 11/19/2019 PCP: Robyne Peers, MD   LOS: 1 day   Brief narrative: As per HPI,  [name] [name] is a 49 y.o. female with medical history significant for psoriatic arthritis on chronic immunosuppression, chronic iron deficiency anemia on intermittent IV iron transfusions, hypertension,  presented to the hospital with nausea vomiting for 3 to 4 days,.she was unable to keep anything down and had crampy abdominal pain.  Denied any fever.  She also had presyncopal symptoms followed by syncope. Medical history is notable for history of psoriatic arthritis for which she treated with the immunosuppressant Abatacept.  She also has a history of chronic iron deficiency anemia for which she follows with Dr. Lorenso Courier as her outpatient heme-onc provider, and undergoes intermittent IV iron supplementation.  Following EGD/colonoscopy performed during the first week of March 2021 to evaluate the underlying source of her chronic iron deficiency anemia, it appears that the source is suspected to be diminished gastrointestinal absorption of enteric iron.   In the ED, patient had low-grade fever with hypotension which improved with IV fluids. Lab was notable for sodium 131, relative to most recent prior sodium data point of 135 on 11/18/2019, potassium 3.0, chloride 91, bicarbonate 24, BUN 12, creatinine 1.17 relative to most recent prior creatinine value of 0.73 on 11/18/2019, alk phos 156 compared to 137 on 11/18/2019, AST 526 compared to 22 on 11/18/2019, ALT 189 compared to 27 on 11/18/2019, and total bilirubin 1.0.  CBC notable for white blood cell count of 12,800 with 76% neutrophils, hemoglobin 13.3.  Serum acetaminophen level found to be less than 10.  Urinalysis showed 6-10 white blood cells, rare bacteria, hyaline casts, and 100 protein.  Nasopharyngeal COVID-19 PCR was negative. Initial lactic acid found to be 5, which normalized after IV  fluid resuscitation. EKG performed in the ED this evening showed sinus rhythm with heart rate 77, prolonged QTC of 515 ms, and no evidence of acute ischemic changes. Chest x-ray showed no evidence of acute cardiopulmonary process.   CT of the head was performed and showed no evidence of acute intracranial process, including no evidence of intracranial hemorrhage.  CT of the abdomen/pelvis showed nondilated, noninflamed fluid-filled small bowel consistent with enteritis in the absence of any evidence of abscess, perforation, or obstruction. In the ED, the following were administered: 2 L normal saline bolus followed by transition to normal saline at 125 cc/h; Ativan 1 mg IV x1, Compazine 10 mg IV x1.      Patient was then considered for admission to the hospital.   Assessment/Plan:  Principal Problem:   AKI (acute kidney injury) ([place]) Active Problems:   Hypokalemia   Nausea and vomiting   Syncope   Dehydration   Transaminitis   Lactic acidosis   Acute hyponatremia  Acute kidney injury: Likely secondary to nausea, vomiting poor oral intake and use of diuretics as outpatient including Lasix, losartan and HCTZ.  Urinalysis showed hyaline casts so she was volume depletion.  Continue IV fluid hydration.  Patient received 2 L of normal saline in the ED.  Renal function has improved at this time.  Continue to hold losartan HCTZ Lasix.  Lactic acid has improved with IV fluid resuscitation.  CK was within normal range.  Leukocytosis. Likely reactive, improved today.  Blood cultures negative so far.  Possible viral enteritis. Patient presented with nausea vomiting and abdominal discomfort. Had diarrhea at home.    CT abdomen/pelvis showing nondilated, noninflamed fluid-filled  small bowel consistent with enteritis.  Continue supportive care including IV fluids ,IV antiemetics.   Will try to limit her Zofran as possible.  Continue Ativan for nausea.  Patient had prolonged QTC.  Will check EKG today.   Lipase was 42.  Prolonged QTC at 515.  Closely monitor.  EKG from today pending.  Try to limit Zofran.  Syncope: Orthostatic syncope likely from volume depletion..  Check orthostatic vitals every shift..  Continue IV fluid hydration.  EKG with QT prolongation but otherwise nonischemic.  Hold diuretics for now .  Lactic acidosis: Initial lactic acid level of 5.0,  this has normalized after adequate fluid resuscitation.  CPK within normal limits.  Hypokalemia: Mild.  We will continue to replenish.  Check levels in a.m.  Magnesium of 2.1.  Received a 40 mEq of potassium in the ED.  Hold Lasix for now.Continue potassium with IV fluids.  Elevated LFTs.  Could be secondary to hypotension from volume depletion.  Pending urinary drug screen.  Check hepatitis panel, pending.  CT of the abdomen/pelvis confirmed no evidence of choledocholithiasis.    INR within normal limits.  COVID-19 was negative.   CRP 0.6.  TSH at 1.7.  INR of 1.0.  Tylenol level less than 10.  History of hip replacement x 9 with infection. On buprenorphine for arthritis and hip pain.  Acute hyponatremia: Likely hypovolemic hyponatremia.  Improved with IV fluid hydration.  Sodium of 138 today.  Continue to hold diuretics.  Continue maintenance IV fluids.  Chronic iron deficiency anemia: baseline hemoglobin range of 8-10.  Per GI consultation as well as endoscopic evaluation in early March 2021, iron deficiency anemia was believed to be on the basis of diminished GI absorption of iron.    Patient follows up with heme-onc as outpatient and receives intermittent IV iron infusions.  Hemoglobin of 11.9 today.  VTE Prophylaxis: Heparin subcu  Code Status: Full code  Family Communication:  I had a prolonged discussion with the patient's husband and updated him about the clinical condition of the patient.   Disposition Plan:  . Patient is from home . Likely disposition to home likely in 1 to 2 days . Barriers to discharge:  Persistent nausea, electrolyte imbalance, volume depletion, elevated LFT, check hepatitis panel.  Consultants:  None  Procedures:  None  Antibiotics:  . None  Anti-infectives (From admission, onward)   None     Subjective:  Today, patient was seen and examined at bedside.  Patient still complains of nausea and vomiting.  Was downgraded to clears from full liquids.  Denies overt abdominal pain.  Denies chest pain, shortness of breath, dizziness.  Objective: Vitals:   11/20/19 0154 11/20/19 0645  BP: 131/87 95/65  Pulse: 88 62  Resp: 18   Temp:  98.3 F (36.8 C)  SpO2: 100% 95%    Intake/Output Summary (Last 24 hours) at 11/20/2019 1448 Last data filed at 11/20/2019 1133 Gross per 24 hour  Intake 4992.31 ml  Output 2900 ml  Net 2092.31 ml   Filed Weights   11/19/19 2351 11/20/19 0117  Weight: 94.3 kg 94.3 kg   Body mass index is 35.68 kg/m.   Physical Exam:  GENERAL: Patient is alert awake and oriented. Not in obvious distress.  Morbidly obese HENT: No scleral pallor or icterus. Pupils equally reactive to light. Oral mucosa is dry NECK: is supple, no gross swelling noted. CHEST: Clear to auscultation. No crackles or wheezes.  Diminished breath sounds bilaterally. CVS: S1 and S2 heard,  no murmur. Regular rate and rhythm.  ABDOMEN: Soft, nonspecific tenderness noted, bowel sounds are present. EXTREMITIES: No edema. CNS: Cranial nerves are intact. No focal motor deficits. SKIN: warm and dry without rashes.  Decreased skin turgor  Data Review: I have personally reviewed the following laboratory data and studies,  CBC: Recent Labs  Lab 11/18/19 0946 11/19/19 1747 11/20/19 0400  WBC 14.2* 12.8* 8.4  NEUTROABS 11.2* 9.8* 5.7  HGB 15.3* 13.3 11.9*  HCT 45.8 40.1 37.0  MCV 88.9 92.2 94.9  PLT 521* 402* 481   Basic Metabolic Panel: Recent Labs  Lab 11/18/19 0946 11/19/19 1747 11/19/19 1955 11/20/19 0400  NA 135 131*  --  138  K 3.6 3.0*  --  3.4*    CL 98 91*  --  105  CO2 23 24  --  25  GLUCOSE 109* 102*  --  99  BUN 13 12  --  8  CREATININE 0.73 1.17*  --  0.60  CALCIUM 8.8* 8.0*  --  7.4*  MG  --   --  2.1 2.1   Liver Function Tests: Recent Labs  Lab 11/18/19 0946 11/19/19 1747 11/20/19 0400  AST 22 526* 209*  ALT 27 189* 180*  ALKPHOS 137* 156* 141*  BILITOT 0.4 1.0 0.6  PROT 7.7 6.4* 5.5*  ALBUMIN 4.4 3.7 3.1*   Recent Labs  Lab 11/19/19 1747  LIPASE 42   No results for input(s): AMMONIA in the last 168 hours. Cardiac Enzymes: Recent Labs  Lab 11/20/19 0400  CKTOTAL 195   BNP (last 3 results) Recent Labs    08/12/19 1330 08/13/19 0421 08/14/19 0337  BNP 283.1* 536.7* 394.6*    ProBNP (last 3 results) No results for input(s): PROBNP in the last 8760 hours.  CBG: No results for input(s): GLUCAP in the last 168 hours. Recent Results (from the past 240 hour(s))  Blood culture (routine x 2)     Status: None (Preliminary result)   Collection Time: 11/19/19  5:36 PM   Specimen: BLOOD  Result Value Ref Range Status   Specimen Description   Final    BLOOD RIGHT ANTECUBITAL Performed at [facility] [address], [place], [place] [address]    Special Requests   Final    BOTTLES DRAWN AEROBIC AND ANAEROBIC Blood Culture results may not be optimal due to an excessive volume of blood received in culture bottles Performed at [facility] [address] [place], [place] [address]    Culture   Final    NO GROWTH < 12 HOURS Performed at [facility] [address], [place], [place] [address]    Report Status PENDING  Incomplete  Blood culture (routine x 2)     Status: None (Preliminary result)   Collection Time: 11/19/19  7:55 PM   Specimen: BLOOD  Result Value Ref Range Status   Specimen Description   Final    BLOOD LEFT ANTECUBITAL Performed at [facility] [address] [place], [place] [address]    Special Requests   Final     BOTTLES DRAWN AEROBIC AND ANAEROBIC Blood Culture results may not be optimal due to an inadequate volume of blood received in culture bottles Performed at [facility] [address], [place], [place] [address]    Culture   Final    NO GROWTH < 12 HOURS Performed at [facility] [address], [place], [place] [address]  Report Status PENDING  Incomplete  SARS CORONAVIRUS 2 (TAT 6-24 HRS) Nasopharyngeal Nasopharyngeal Swab     Status: None   Collection Time: 11/19/19  9:15 PM   Specimen: Nasopharyngeal Swab  Result Value Ref Range Status   SARS Coronavirus 2 NEGATIVE NEGATIVE Final    Comment: (NOTE) SARS-CoV-2 target nucleic acids are NOT DETECTED. The SARS-CoV-2 RNA is generally detectable in upper and lower respiratory specimens during the acute phase of infection. Negative results do not preclude SARS-CoV-2 infection, do not rule out co-infections with other pathogens, and should not be used as the sole basis for treatment or other patient management decisions. Negative results must be combined with clinical observations, patient history, and epidemiological information. The expected result is Negative. Fact Sheet for Patients: SugarRoll.be Fact Sheet for Healthcare Providers: https://www.woods-mathews.com/ This test is not yet approved or cleared by the [place] FDA and  has been authorized for detection and/or diagnosis of SARS-CoV-2 by FDA under an Emergency Use Authorization (EUA). This EUA will remain  in effect (meaning this test can be used) for the duration of the COVID-19 declaration under Section 56 4(b)(1) of the Act, 21 U.S.C. section 360bbb-3(b)(1), unless the authorization is terminated or revoked sooner. Performed at [facility], [place] [address] [place], [place] [address]      Studies: CT ABDOMEN PELVIS WO CONTRAST  Result Date: 11/19/2019 CLINICAL DATA:  Abdominal  distension. Nausea, vomiting, diarrhea. Near syncope. EXAM: CT ABDOMEN AND PELVIS WITHOUT CONTRAST TECHNIQUE: Multidetector CT imaging of the abdomen and pelvis was performed following the standard protocol without IV contrast. COMPARISON:  CT 12/29/2018 FINDINGS: Lower chest: Minor lung base atelectasis. Mild distal esophageal wall thickening. Hepatobiliary: Enlarged liver spanning 23 cm cranial caudal. No evidence of focal lesion on noncontrast exam. Clips in the gallbladder fossa postcholecystectomy. No biliary dilatation. Pancreas: No ductal dilatation or inflammation. Spleen: Normal in size without focal abnormality. Adrenals/Urinary Tract: Normal adrenal glands. Mild right hydronephrosis and hydroureter. No evidence of renal or ureteral stone. No perinephric edema. Minimal prominence of the left renal collecting system and ureter. No left urolithiasis. Distended urinary bladder. No obvious bladder wall thickening. There is streak artifact from bilateral hip prostheses. Stomach/Bowel: Mild distal esophageal wall thickening. Fluid/ingested material in the stomach. No obvious gastric wall thickening. Fluid-filled small bowel in the central abdomen that is nondilated or inflamed. Cecum is located in the central pelvis. Portions of normal appendix are visualized coursing into the right lower quadrant. No appendicitis. Stool in the cecum and proximal ascending colon. Remainder of the colon is decompressed. No liquid stool or pericolonic edema. Vascular/Lymphatic: Normal caliber abdominal aorta. Small retroperitoneal lymph nodes not enlarged by size criteria. No bulky abdominopelvic adenopathy. Reproductive: Bilateral tubal ligation clips. Uterus is unremarkable. No evidence of adnexal mass. Other: Ascites. No free air. Small fat containing umbilical hernia. Small fat containing supraumbilical ventral abdominal wall hernia. Musculoskeletal: Multilevel degenerative change throughout the spine with Modic endplate  changes. Bilateral hip arthroplasties. Lucency about the left femoral stem is partially included, but chronic and seen on prior exam. IMPRESSION: 1. Mild right hydronephrosis and hydroureter without obstructing stone. This may be secondary to distended urinary bladder. 2. Fluid-filled small bowel which are nondilated or inflamed, can be seen with enteritis or normal p.o. ingestion. 3. Mild distal esophageal wall thickening, can be seen with reflux or esophagitis. 4. Hepatomegaly. Electronically Signed   By: Keith Rake M.D.   On: 11/19/2019 19:12   CT Head Wo Contrast  Result Date: 11/19/2019  CLINICAL DATA:  Headache.  Trauma. EXAM: CT HEAD WITHOUT CONTRAST TECHNIQUE: Contiguous axial images were obtained from the base of the skull through the vertex without intravenous contrast. COMPARISON:  07/17/2014 FINDINGS: Brain: No evidence of acute infarction, hemorrhage, hydrocephalus, extra-axial collection or mass lesion/mass effect. Vascular: No hyperdense vessel or unexpected calcification. Skull: Normal. Negative for fracture or focal lesion. Sinuses/Orbits: No acute finding. Other: None. IMPRESSION: No acute intracranial abnormality. Electronically Signed   By: Constance Holster M.D.   On: 11/19/2019 19:08   DG Chest Portable 1 View  Result Date: 11/19/2019 CLINICAL DATA:  Emesis EXAM: PORTABLE CHEST 1 VIEW COMPARISON:  08/14/2019 FINDINGS: The heart size and mediastinal contours are within normal limits. Both lungs are clear. The visualized skeletal structures are unremarkable. IMPRESSION: No active disease. Electronically Signed   By: Donavan Foil M.D.   On: 11/19/2019 19:55   US Abdomen Limited RUQ  Result Date: 11/19/2019 CLINICAL DATA:  49 year old female with elevated LFTs. Prior cholecystectomy. EXAM: ULTRASOUND ABDOMEN LIMITED RIGHT UPPER QUADRANT COMPARISON:  Right upper quadrant ultrasound dated 02/16/2019 and CT abdomen pelvis dated 11/19/2019. FINDINGS: Gallbladder: Cholecystectomy.  Common bile duct: Diameter: 7 mm Liver: The liver demonstrates a normal echogenicity. There is enlargement of the left lobe of the liver which may represent a Reidel lobe anatomy portal vein is patent on color Doppler imaging with normal direction of blood flow towards the liver. Other: None. IMPRESSION: 1. Cholecystectomy. 2. Enlargement of the right lobe of the liver. Clinical correlation is recommended. 3. Patent main portal vein with hepatopetal flow.  Or Electronically Signed   By: Anner Crete M.D.   On: 11/19/2019 19:47      Flora Lipps, MD  [facility] 11/20/2019

## 2019-11-21 DIAGNOSIS — N179 Acute kidney failure, unspecified: Principal | ICD-10-CM

## 2019-11-21 LAB — CBC
HCT: 36.1 % (ref 36.0–46.0)
Hemoglobin: 11.2 g/dL — ABNORMAL LOW (ref 12.0–15.0)
MCH: 30.4 pg (ref 26.0–34.0)
MCHC: 31 g/dL (ref 30.0–36.0)
MCV: 97.8 fL (ref 80.0–100.0)
Platelets: 314 10*3/uL (ref 150–400)
RBC: 3.69 MIL/uL — ABNORMAL LOW (ref 3.87–5.11)
RDW: 18.3 % — ABNORMAL HIGH (ref 11.5–15.5)
WBC: 7.8 10*3/uL (ref 4.0–10.5)
nRBC: 0 % (ref 0.0–0.2)

## 2019-11-21 LAB — COMPREHENSIVE METABOLIC PANEL
ALT: 105 U/L — ABNORMAL HIGH (ref 0–44)
AST: 55 U/L — ABNORMAL HIGH (ref 15–41)
Albumin: 3 g/dL — ABNORMAL LOW (ref 3.5–5.0)
Alkaline Phosphatase: 124 U/L (ref 38–126)
Anion gap: 8 (ref 5–15)
BUN: 6 mg/dL (ref 6–20)
CO2: 25 mmol/L (ref 22–32)
Calcium: 7.9 mg/dL — ABNORMAL LOW (ref 8.9–10.3)
Chloride: 106 mmol/L (ref 98–111)
Creatinine, Ser: 0.46 mg/dL (ref 0.44–1.00)
GFR calc Af Amer: >60 mL/min (ref >60)
GFR calc non Af Amer: >60 mL/min (ref >60)
Glucose, Bld: 103 mg/dL — ABNORMAL HIGH (ref 70–99)
Potassium: 3.7 mmol/L (ref 3.5–5.1)
Sodium: 139 mmol/L (ref 135–145)
Total Bilirubin: 0.5 mg/dL (ref 0.3–1.2)
Total Protein: 5.2 g/dL — ABNORMAL LOW (ref 6.5–8.1)

## 2019-11-21 LAB — URINE CULTURE: Culture: 70000 — AB

## 2019-11-21 LAB — HEPATITIS PANEL, ACUTE
HCV Ab: NONREACTIVE
Hep A IgM: NONREACTIVE
Hep B C IgM: NONREACTIVE
Hepatitis B Surface Ag: NONREACTIVE

## 2019-11-21 LAB — CALCIUM, IONIZED: Calcium, Ionized, Serum: 4.4 mg/dL — ABNORMAL LOW (ref 4.5–5.6)

## 2019-11-21 LAB — MAGNESIUM: Magnesium: 2 mg/dL (ref 1.7–2.4)

## 2019-11-21 LAB — PHOSPHORUS: Phosphorus: 3.1 mg/dL (ref 2.5–4.6)

## 2019-11-21 MED ORDER — ONDANSETRON HCL 4 MG PO TABS: 4.0000 mg | ORAL_TABLET | Freq: Every day | ORAL | 1 refills | Status: DC | PRN

## 2019-11-21 NOTE — Discharge Summary (Signed)
Physician Discharge Summary  [name] [name] XTA:[id number] DOB: 02/01/1971 DOA: 11/19/2019  PCP: Robyne Peers, MD  Admit date: 11/19/2019 Discharge date: 11/21/2019  Admitted From: Home  disposition: Home Recommendations for Outpatient Follow-up:  1. Follow up with PCP in 1-2 weeks 2. Please obtain BMP/CBC in one week   Home Health none Equipment/Devices: None  Discharge Condition: Stable and improved CODE STATUS: Full code Diet recommendation: Cardiac diet Brief/Interim Summary:49 y.o.femalewith medical history significant forpsoriatic arthritis on chronic immunosuppression, chronic iron deficiency anemia on intermittent IV iron transfusions, hypertension, presented to the hospital with nausea vomiting for 3 to 4 days,.she was unable to keep anything down and had crampy abdominal pain.  Denied any fever.  She also had presyncopal symptoms followed by syncope. Medical history is notable for history of psoriatic arthritis for which she treated with the immunosuppressantAbatacept.She also has a history of chronic iron deficiency anemia for which she follows with Dr. Tamsen Roers her outpatient heme-onc provider,and undergoes intermittent IV iron supplementation. Following EGD/colonoscopy performed during the first week of March 2021 to evaluate the underlying source of her chronic iron deficiency anemia, it appears that the source is suspected to be diminished gastrointestinal absorption ofenteric iron.  In the ED, patient had low-grade fever with hypotension which improved with IV fluids. Lab was notable for sodium 131, relative to most recent prior sodium data point of 135 on 11/18/2019, potassium 3.0, chloride 91, bicarbonate 24, BUN 12, creatinine 1.17 relative to most recent prior creatinine value of 0.73 on 11/18/2019, alk phos 156 compared to 137 on 11/18/2019, AST 526 compared to 22 on 11/18/2019, ALT 189 compared to 27 on 11/18/2019, and total bilirubin 1.0. CBC notable for  white blood cell count of 12,800 with 76% neutrophils, hemoglobin 13.3.Serum acetaminophen level found to be less than 10. Urinalysis showed 6-10 white blood cells, rare bacteria, hyaline casts, and 100 protein. Nasopharyngeal COVID-19 PCR was negative.Initial lactic acid found to be 5, which normalized after IV fluid resuscitation. EKG performed in the ED this evening showed sinus rhythm with heart rate 77, prolonged QTC of 560m,and no evidence of acute ischemic changes. Chest x-ray showed no evidence of acute cardiopulmonary process.  CT of the head was performed and showed no evidence of acute intracranial process, including no evidence of intracranial hemorrhage. CT of the abdomen/pelvis showed nondilated, noninflamed fluid-filled small bowel consistent with enteritis in the absence of any evidence of abscess, perforation, or obstruction. In the ED, the following were administered:2 L normal saline bolus followed by transition to normal saline at 125 cc/h;Ativan 1 mg IV x1, Compazine 10 mg IV x1.   Patient was then considered for admission to the hospital.  Discharge Diagnoses:  Principal Problem:   AKI (acute kidney injury) ([place] Active Problems:   Hypokalemia   Nausea and vomiting   Syncope   Dehydration   Transaminitis   Lactic acidosis   Acute hyponatremia   Acute kidney injury:  secondary to nausea, vomiting poor oral intake and use of diuretics as outpatient including Lasix, losartan and HCTZ.  Urinalysis showed hyaline casts so she was volume depletion.  She received 2 L of normal saline.  All her antihypertensives were on hold during the hospital stay and at the time of discharge due to persistent soft to low normal blood pressure.  Restart as an outpatient if needed one by one.  Lactic acid has improved with IV fluid resuscitation.  CK was within normal range.  Leukocytosis.  Resolved no evidence of infection.  Possible viral enteritis. Patient presented with nausea  vomiting and abdominal discomfort. Had diarrhea at home.    CT abdomen/pelvis showing nondilated, noninflamed fluid-filled small bowel consistent with enteritis.   She was treated with IV fluids and IV antiemetics.  Since patient had prolonged QTC will not prescribe Zofran at the time of discharge.  Continue Ativan as she was taking at home. Lipase was 42.  Prolonged QTC at 515.    Continue only Ativan for nausea at home. Syncope: Orthostatic syncope likely from volume depletion.  EKG with QT prolongation but otherwise nonischemic.    Lactic acidosis: Initial lactic acid level of 5.0, this has normalized after adequate fluid resuscitation.  CPK within normal limits.  Hypokalemia:  Potassium 3.7 magnesium 2.1 on the day of discharge.   Elevated LFTs.  Could be secondary to hypotension from volume depletion.  Improving with IV hydration.  Need to follow as an outpatient.  Urine drug screen was negative.  CT of the abdomen/pelvis confirmed no evidence of choledocholithiasis.   INR within normal limits.  COVID-19 was negative.   CRP 0.6.  TSH at 1.7.  INR of 1.0.  Tylenol level less than 10.  History of hip replacement x 9 with infection. On buprenorphine for arthritis and hip pain.  Acute hyponatremia: Likely hypovolemic hyponatremia.  Improved with IV fluid hydration.    Sodium 139 on the day of discharge.    Chronic iron deficiency anemia: baseline hemoglobin range of 8-10.Per GI consultation as well as endoscopic evaluation in early March 2021, iron deficiency anemia was believed to be on the basis of diminished GI absorption of iron.  Patient follows up with heme-onc as outpatient and receives intermittent IV iron infusions.  Hemoglobin of 11.2   on the day of discharge.   Estimated body mass index is 35.68 kg/m as calculated from the following:   Height as of this encounter: '5\' 4"'  (1.626 m).   Weight as of this encounter: 94.3 kg.  Discharge Instructions  Discharge Instructions     Call MD for:  persistant nausea and vomiting   Complete by: As directed    Call MD for:  temperature >100.4   Complete by: As directed    Diet - low sodium heart healthy   Complete by: As directed    Increase activity slowly   Complete by: As directed      Allergies as of 11/21/2019      Reactions   Nsaids Other (See Comments)   stroke CVA    Benzoin Dermatitis, Other (See Comments)   Localized - Skin Red with Burning Sensation Skin turns red and gets inflamed      Medication List    STOP taking these medications   albuterol 108 (90 Base) MCG/ACT inhaler Commonly known as: VENTOLIN HFA   furosemide 20 MG tablet Commonly known as: LASIX   hydrochlorothiazide 12.5 MG capsule Commonly known as: MICROZIDE   losartan 100 MG tablet Commonly known as: COZAAR   metoCLOPramide 5 MG tablet Commonly known as: Reglan   potassium chloride SA 20 MEQ tablet Commonly known as: KLOR-CON   predniSONE 20 MG tablet Commonly known as: DELTASONE     TAKE these medications   aspirin 325 MG EC tablet Take 325 mg by mouth daily.   atorvastatin 40 MG tablet Commonly known as: LIPITOR Take 40 mg by mouth daily.   Belbuca 450 MCG Film Generic drug: Buprenorphine HCl Place 450 mcg inside cheek in the morning and at bedtime.  escitalopram 20 MG tablet Commonly known as: LEXAPRO Take 20 mg by mouth daily.   ORENCIA IV Inject into the vein. Psoriatic arthristis every 4 weeks   pantoprazole 40 MG tablet Commonly known as: PROTONIX Take 40 mg by mouth daily as needed (indigestion).   pregabalin 75 MG capsule Commonly known as: LYRICA Take 150 mg by mouth 3 (three) times daily.      Follow-up Information    Robyne Peers, MD Follow up.   Specialty: Family Medicine Contact information: [address] [place] [address] [phone]          Allergies  Allergen Reactions  . Nsaids Other (See Comments)    stroke CVA   . Benzoin Dermatitis  and Other (See Comments)    Localized - Skin Red with Burning Sensation Skin turns red and gets inflamed     Consultations: None  Procedures/Studies: CT ABDOMEN PELVIS WO CONTRAST  Result Date: 11/19/2019 CLINICAL DATA:  Abdominal distension. Nausea, vomiting, diarrhea. Near syncope. EXAM: CT ABDOMEN AND PELVIS WITHOUT CONTRAST TECHNIQUE: Multidetector CT imaging of the abdomen and pelvis was performed following the standard protocol without IV contrast. COMPARISON:  CT 12/29/2018 FINDINGS: Lower chest: Minor lung base atelectasis. Mild distal esophageal wall thickening. Hepatobiliary: Enlarged liver spanning 23 cm cranial caudal. No evidence of focal lesion on noncontrast exam. Clips in the gallbladder fossa postcholecystectomy. No biliary dilatation. Pancreas: No ductal dilatation or inflammation. Spleen: Normal in size without focal abnormality. Adrenals/Urinary Tract: Normal adrenal glands. Mild right hydronephrosis and hydroureter. No evidence of renal or ureteral stone. No perinephric edema. Minimal prominence of the left renal collecting system and ureter. No left urolithiasis. Distended urinary bladder. No obvious bladder wall thickening. There is streak artifact from bilateral hip prostheses. Stomach/Bowel: Mild distal esophageal wall thickening. Fluid/ingested material in the stomach. No obvious gastric wall thickening. Fluid-filled small bowel in the central abdomen that is nondilated or inflamed. Cecum is located in the central pelvis. Portions of normal appendix are visualized coursing into the right lower quadrant. No appendicitis. Stool in the cecum and proximal ascending colon. Remainder of the colon is decompressed. No liquid stool or pericolonic edema. Vascular/Lymphatic: Normal caliber abdominal aorta. Small retroperitoneal lymph nodes not enlarged by size criteria. No bulky abdominopelvic adenopathy. Reproductive: Bilateral tubal ligation clips. Uterus is unremarkable. No evidence of  adnexal mass. Other: Ascites. No free air. Small fat containing umbilical hernia. Small fat containing supraumbilical ventral abdominal wall hernia. Musculoskeletal: Multilevel degenerative change throughout the spine with Modic endplate changes. Bilateral hip arthroplasties. Lucency about the left femoral stem is partially included, but chronic and seen on prior exam. IMPRESSION: 1. Mild right hydronephrosis and hydroureter without obstructing stone. This may be secondary to distended urinary bladder. 2. Fluid-filled small bowel which are nondilated or inflamed, can be seen with enteritis or normal p.o. ingestion. 3. Mild distal esophageal wall thickening, can be seen with reflux or esophagitis. 4. Hepatomegaly. Electronically Signed   By: Keith Rake M.D.   On: 11/19/2019 19:12   CT Head Wo Contrast  Result Date: 11/19/2019 CLINICAL DATA:  Headache.  Trauma. EXAM: CT HEAD WITHOUT CONTRAST TECHNIQUE: Contiguous axial images were obtained from the base of the skull through the vertex without intravenous contrast. COMPARISON:  07/17/2014 FINDINGS: Brain: No evidence of acute infarction, hemorrhage, hydrocephalus, extra-axial collection or mass lesion/mass effect. Vascular: No hyperdense vessel or unexpected calcification. Skull: Normal. Negative for fracture or focal lesion. Sinuses/Orbits: No acute finding. Other: None. IMPRESSION: No acute intracranial abnormality.  Electronically Signed   By: Constance Holster M.D.   On: 11/19/2019 19:08   DG Chest Portable 1 View  Result Date: 11/19/2019 CLINICAL DATA:  Emesis EXAM: PORTABLE CHEST 1 VIEW COMPARISON:  08/14/2019 FINDINGS: The heart size and mediastinal contours are within normal limits. Both lungs are clear. The visualized skeletal structures are unremarkable. IMPRESSION: No active disease. Electronically Signed   By: Donavan Foil M.D.   On: 11/19/2019 19:55   US Abdomen Limited RUQ  Result Date: 11/19/2019 CLINICAL DATA:  49 year old female with  elevated LFTs. Prior cholecystectomy. EXAM: ULTRASOUND ABDOMEN LIMITED RIGHT UPPER QUADRANT COMPARISON:  Right upper quadrant ultrasound dated 02/16/2019 and CT abdomen pelvis dated 11/19/2019. FINDINGS: Gallbladder: Cholecystectomy. Common bile duct: Diameter: 7 mm Liver: The liver demonstrates a normal echogenicity. There is enlargement of the left lobe of the liver which may represent a Reidel lobe anatomy portal vein is patent on color Doppler imaging with normal direction of blood flow towards the liver. Other: None. IMPRESSION: 1. Cholecystectomy. 2. Enlargement of the right lobe of the liver. Clinical correlation is recommended. 3. Patent main portal vein with hepatopetal flow.  Or Electronically Signed   By: Anner Crete M.D.   On: 11/19/2019 19:47   (Echo, Carotid, EGD, Colonoscopy, ERCP)    Subjective: She is resting in bed anxious to go home no nausea vomiting no complaints ambulates to the restroom by herself without any dizziness.  Discharge Exam: Vitals:   11/20/19 2109 11/20/19 2352  BP: 114/73 (!) 107/53  Pulse: 82 81  Resp: (!) 1 18  Temp: 98.4 F (36.9 C) 98.1 F (36.7 C)  SpO2: 100% 100%   Vitals:   11/20/19 0645 11/20/19 1501 11/20/19 2109 11/20/19 2352  BP: 95/65 127/87 114/73 (!) 107/53  Pulse: 62 66 82 81  Resp:   (!) 1 18  Temp: 98.3 F (36.8 C) 97.9 F (36.6 C) 98.4 F (36.9 C) 98.1 F (36.7 C)  TempSrc: Oral Oral    SpO2: 95% 99% 100% 100%  Weight:      Height:        General: Pt is alert, awake, not in acute distress Cardiovascular: RRR, S1/S2 +, no rubs, no gallops Respiratory: CTA bilaterally, no wheezing, no rhonchi Abdominal: Soft, NT, ND, bowel sounds + Extremities: no edema, no cyanosis    The results of significant diagnostics from this hospitalization (including imaging, microbiology, ancillary and laboratory) are listed below for reference.     Microbiology: Recent Results (from the past 240 hour(s))  Blood culture (routine x 2)      Status: None (Preliminary result)   Collection Time: 11/19/19  5:36 PM   Specimen: BLOOD  Result Value Ref Range Status   Specimen Description   Final    BLOOD RIGHT ANTECUBITAL Performed at [facility] [address] [place], [place] [address]    Special Requests   Final    BOTTLES DRAWN AEROBIC AND ANAEROBIC Blood Culture results may not be optimal due to an excessive volume of blood received in culture bottles Performed at [facility] [address] [place], [place] [address]    Culture   Final    NO GROWTH 2 DAYS Performed at [facility] [address], [place], [place] [address]    Report Status PENDING  Incomplete  Urine culture     Status: Abnormal   Collection Time: 11/19/19  5:47 PM   Specimen: Urine, Random  Result Value Ref Range Status  Specimen Description   Final    URINE, RANDOM Performed at [facility], [place] [address], [place], [place] [address]    Special Requests   Final    NONE Performed at [facility], [place] [address], [place], [place] [address]    Culture (A)  Final    70,000 COLONIES/mL MULTIPLE SPECIES PRESENT, SUGGEST RECOLLECTION   Report Status 11/21/2019 FINAL  Final  Blood culture (routine x 2)     Status: None (Preliminary result)   Collection Time: 11/19/19  7:55 PM   Specimen: BLOOD  Result Value Ref Range Status   Specimen Description   Final    BLOOD LEFT ANTECUBITAL Performed at [facility] [address], [place], [place] [address]    Special Requests   Final    BOTTLES DRAWN AEROBIC AND ANAEROBIC Blood Culture results may not be optimal due to an inadequate volume of blood received in culture bottles Performed at [facility] [address], [place], [place] [address]    Culture   Final    NO GROWTH 2 DAYS Performed at [facility] [address] [place], [place] [address]    Report Status  PENDING  Incomplete  SARS CORONAVIRUS 2 (TAT 6-24 HRS) Nasopharyngeal Nasopharyngeal Swab     Status: None   Collection Time: 11/19/19  9:15 PM   Specimen: Nasopharyngeal Swab  Result Value Ref Range Status   SARS Coronavirus 2 NEGATIVE NEGATIVE Final    Comment: (NOTE) SARS-CoV-2 target nucleic acids are NOT DETECTED. The SARS-CoV-2 RNA is generally detectable in upper and lower respiratory specimens during the acute phase of infection. Negative results do not preclude SARS-CoV-2 infection, do not rule out co-infections with other pathogens, and should not be used as the sole basis for treatment or other patient management decisions. Negative results must be combined with clinical observations, patient history, and epidemiological information. The expected result is Negative. Fact Sheet for Patients: SugarRoll.be Fact Sheet for Healthcare Providers: https://www.woods-.com/ This test is not yet approved or cleared by the [place] FDA and  has been authorized for detection and/or diagnosis of SARS-CoV-2 by FDA under an Emergency Use Authorization (EUA). This EUA will remain  in effect (meaning this test can be used) for the duration of the COVID-19 declaration under Section 56 4(b)(1) of the Act, 21 U.S.C. section 360bbb-3(b)(1), unless the authorization is terminated or revoked sooner. Performed at [facility], [place] [address], [place], [place] [address]      Labs: BNP (last 3 results) Recent Labs    08/12/19 1330 08/13/19 0421 08/14/19 0337  BNP 283.1* 536.7* 295.1*   Basic Metabolic Panel: Recent Labs  Lab 11/18/19 0946 11/19/19 1747 11/19/19 1955 11/20/19 0400 11/21/19 0331  NA 135 131*  --  138 139  K 3.6 3.0*  --  3.4* 3.7  CL 98 91*  --  105 106  CO2 23 24  --  25 25  GLUCOSE 109* 102*  --  99 103*  BUN 13 12  --  8 6  CREATININE 0.73 1.17*  --  0.60 0.46  CALCIUM 8.8* 8.0*  --  7.4* 7.9*  MG  --    --  2.1 2.1 2.0  PHOS  --   --   --   --  3.1   Liver Function Tests: Recent Labs  Lab 11/18/19 0946 11/19/19 1747 11/20/19 0400 11/21/19 0331  AST 22 526* 209* 55*  ALT  27 189* 180* 105*  ALKPHOS 137* 156* 141* 124  BILITOT 0.4 1.0 0.6 0.5  PROT 7.7 6.4* 5.5* 5.2*  ALBUMIN 4.4 3.7 3.1* 3.0*   Recent Labs  Lab 11/19/19 1747  LIPASE 42   No results for input(s): AMMONIA in the last 168 hours. CBC: Recent Labs  Lab 11/18/19 0946 11/19/19 1747 11/20/19 0400 11/21/19 0331  WBC 14.2* 12.8* 8.4 7.8  NEUTROABS 11.2* 9.8* 5.7  --   HGB 15.3* 13.3 11.9* 11.2*  HCT 45.8 40.1 37.0 36.1  MCV 88.9 92.2 94.9 97.8  PLT 521* 402* 338 314   Cardiac Enzymes: Recent Labs  Lab 11/20/19 0400  CKTOTAL 195   BNP: Invalid input(s): POCBNP CBG: No results for input(s): GLUCAP in the last 168 hours. D-Dimer No results for input(s): DDIMER in the last 72 hours. Hgb A1c No results for input(s): HGBA1C in the last 72 hours. Lipid Profile No results for input(s): CHOL, HDL, LDLCALC, TRIG, CHOLHDL, LDLDIRECT in the last 72 hours. Thyroid function studies Recent Labs    11/20/19 0400  TSH 1.782   Anemia work up Recent Labs    11/18/19 0946 11/18/19 0947  FERRITIN 85  --   TIBC 389  --   IRON 88  --   RETICCTPCT  --  1.5   Urinalysis    Component Value Date/Time   COLORURINE YELLOW 11/19/2019 1747   APPEARANCEUR HAZY (A) 11/19/2019 1747   LABSPEC 1.013 11/19/2019 1747   PHURINE 5.0 11/19/2019 1747   GLUCOSEU NEGATIVE 11/19/2019 1747   HGBUR NEGATIVE 11/19/2019 1747   BILIRUBINUR NEGATIVE 11/19/2019 1747   KETONESUR NEGATIVE 11/19/2019 1747   PROTEINUR 100 (A) 11/19/2019 1747   UROBILINOGEN 0.2 07/17/2014 0105   NITRITE NEGATIVE 11/19/2019 1747   LEUKOCYTESUR NEGATIVE 11/19/2019 1747   Sepsis Labs Invalid input(s): PROCALCITONIN,  WBC,  LACTICIDVEN Microbiology Recent Results (from the past 240 hour(s))  Blood culture (routine x 2)     Status: None (Preliminary  result)   Collection Time: 11/19/19  5:36 PM   Specimen: BLOOD  Result Value Ref Range Status   Specimen Description   Final    BLOOD RIGHT ANTECUBITAL Performed at [facility], [place] [address] [place], [place] [address]    Special Requests   Final    BOTTLES DRAWN AEROBIC AND ANAEROBIC Blood Culture results may not be optimal due to an excessive volume of blood received in culture bottles Performed at [facility] [address], [place], [place] [address]    Culture   Final    NO GROWTH 2 DAYS Performed at [facility] [address], [place], [place] [address]    Report Status PENDING  Incomplete  Urine culture     Status: Abnormal   Collection Time: 11/19/19  5:47 PM   Specimen: Urine, Random  Result Value Ref Range Status   Specimen Description   Final    URINE, RANDOM Performed at [facility] [address], [place], [place] [address]    Special Requests   Final    NONE Performed at [facility], [place] [address], [place], [place] [address]    Culture (A)  Final    70,000 COLONIES/mL MULTIPLE SPECIES PRESENT, SUGGEST RECOLLECTION   Report Status 11/21/2019 FINAL  Final  Blood culture (routine x 2)     Status: None (Preliminary result)   Collection Time: 11/19/19  7:55 PM   Specimen: BLOOD  Result Value Ref Range  Status   Specimen Description   Final    BLOOD LEFT ANTECUBITAL Performed at [facility] [address], [place], [place] [address]    Special Requests   Final    BOTTLES DRAWN AEROBIC AND ANAEROBIC Blood Culture results may not be optimal due to an inadequate volume of blood received in culture bottles Performed at [facility] [address] [place], [place] [address]    Culture   Final    NO GROWTH 2 DAYS Performed at [facility] [address], [place], [place] [address]    Report Status PENDING  Incomplete  SARS  CORONAVIRUS 2 (TAT 6-24 HRS) Nasopharyngeal Nasopharyngeal Swab     Status: None   Collection Time: 11/19/19  9:15 PM   Specimen: Nasopharyngeal Swab  Result Value Ref Range Status   SARS Coronavirus 2 NEGATIVE NEGATIVE Final    Comment: (NOTE) SARS-CoV-2 target nucleic acids are NOT DETECTED. The SARS-CoV-2 RNA is generally detectable in upper and lower respiratory specimens during the acute phase of infection. Negative results do not preclude SARS-CoV-2 infection, do not rule out co-infections with other pathogens, and should not be used as the sole basis for treatment or other patient management decisions. Negative results must be combined with clinical observations, patient history, and epidemiological information. The expected result is Negative. Fact Sheet for Patients: SugarRoll.be Fact Sheet for Healthcare Providers: https://www.woods-[place].com/ This test is not yet approved or cleared by the [place] FDA and  has been authorized for detection and/or diagnosis of SARS-CoV-2 by FDA under an Emergency Use Authorization (EUA). This EUA will remain  in effect (meaning this test can be used) for the duration of the COVID-19 declaration under Section 56 4(b)(1) of the Act, 21 U.S.C. section 360bbb-3(b)(1), unless the authorization is terminated or revoked sooner. Performed at [facility], [place] [address] [place], [place] [address]      Time coordinating discharge:  39 minutes  SIGNED:   Georgette Shell, MD  [facility] 11/21/2019, 9:27 AM Pager   If 7PM-7AM, please contact night-coverage [email or web address] Password [email or web address]

## 2019-11-24 LAB — CULTURE, BLOOD (ROUTINE X 2)
Culture: NO GROWTH
Culture: NO GROWTH

## 2019-12-02 LAB — JAK2 (INCLUDING V617F AND EXON 12), MPL,& CALR W/RFL MPN PANEL (NGS)

## 2019-12-02 LAB — BCR ABL1 FISH (GENPATH)

## 2019-12-03 ENCOUNTER — Telehealth: Payer: Self-pay | Admitting: *Deleted

## 2019-12-03 NOTE — Telephone Encounter (Signed)
Attempted call to patient regarding recent lab tests. No answer but was able to leave vm message for pt to call back @ [phone] and ask for Dr. Libby Maw nurse.

## 2019-12-03 NOTE — Telephone Encounter (Signed)
-----   Message from Orson Slick, MD sent at 12/02/2019  3:43 PM EDT ----- Please let Mrs. Armond know that her lab workup during our clinic visit did not show any clear cause of her elevated WBC, Hgb, and Plt counts. The genetic tests were negative.  Her blood counts returned to normal during her hospital visit later that month. I think this finding is supportive of inflammation being the primary cause of her blood level abnormalities. We plan to see her back in clinic in about 3 months.   Colan Neptune  ----- Message ----- From: Buel Ream, Lab In [place] Sent: 11/18/2019   9:59 AM EDT To: Orson Slick, MD

## 2019-12-08 ENCOUNTER — Telehealth: Payer: Self-pay | Admitting: *Deleted

## 2019-12-08 NOTE — Telephone Encounter (Signed)
Attempted to call pt several times regarding labs. Voice message was left on first attempt. Other attempt, voicemail was full. Awaiting callback from pt

## 2020-01-21 ENCOUNTER — Other Ambulatory Visit: Payer: Self-pay

## 2020-01-21 ENCOUNTER — Emergency Department (HOSPITAL_COMMUNITY)
Admission: EM | Admit: 2020-01-21 | Discharge: 2020-01-22 | Disposition: A | Discharge status: Home / Self Care | Payer: BC Managed Care – PPO | Attending: Emergency Medicine | Admitting: Emergency Medicine

## 2020-01-21 ENCOUNTER — Emergency Department (HOSPITAL_COMMUNITY): Payer: BC Managed Care – PPO

## 2020-01-21 ENCOUNTER — Encounter (HOSPITAL_COMMUNITY): Payer: Self-pay | Admitting: *Deleted

## 2020-01-21 DIAGNOSIS — Z87891 Personal history of nicotine dependence: Secondary | ICD-10-CM | POA: Insufficient documentation

## 2020-01-21 DIAGNOSIS — Z79899 Other long term (current) drug therapy: Secondary | ICD-10-CM | POA: Diagnosis not present

## 2020-01-21 DIAGNOSIS — Z7982 Long term (current) use of aspirin: Secondary | ICD-10-CM | POA: Insufficient documentation

## 2020-01-21 DIAGNOSIS — R4781 Slurred speech: Secondary | ICD-10-CM | POA: Diagnosis present

## 2020-01-21 DIAGNOSIS — I2510 Atherosclerotic heart disease of native coronary artery without angina pectoris: Secondary | ICD-10-CM | POA: Diagnosis not present

## 2020-01-21 DIAGNOSIS — T50905A Adverse effect of unspecified drugs, medicaments and biological substances, initial encounter: Secondary | ICD-10-CM | POA: Insufficient documentation

## 2020-01-21 DIAGNOSIS — I10 Essential (primary) hypertension: Secondary | ICD-10-CM | POA: Diagnosis not present

## 2020-01-21 LAB — CBC
HCT: 43.8 % (ref 36.0–46.0)
Hemoglobin: 14.7 g/dL (ref 12.0–15.0)
MCH: 32.2 pg (ref 26.0–34.0)
MCHC: 33.6 g/dL (ref 30.0–36.0)
MCV: 95.8 fL (ref 80.0–100.0)
Platelets: 375 10*3/uL (ref 150–400)
RBC: 4.57 MIL/uL (ref 3.87–5.11)
RDW: 13.6 % (ref 11.5–15.5)
WBC: 9.7 10*3/uL (ref 4.0–10.5)
nRBC: 0 % (ref 0.0–0.2)

## 2020-01-21 LAB — APTT: aPTT: 23 seconds — ABNORMAL LOW (ref 24–36)

## 2020-01-21 LAB — COMPREHENSIVE METABOLIC PANEL
ALT: 29 U/L (ref 0–44)
AST: 31 U/L (ref 15–41)
Albumin: 4.2 g/dL (ref 3.5–5.0)
Alkaline Phosphatase: 83 U/L (ref 38–126)
Anion gap: 15 (ref 5–15)
BUN: 14 mg/dL (ref 6–20)
CO2: 27 mmol/L (ref 22–32)
Calcium: 9 mg/dL (ref 8.9–10.3)
Chloride: 95 mmol/L — ABNORMAL LOW (ref 98–111)
Creatinine, Ser: 0.8 mg/dL (ref 0.44–1.00)
GFR calc Af Amer: >60 mL/min (ref >60)
GFR calc non Af Amer: >60 mL/min (ref >60)
Glucose, Bld: 90 mg/dL (ref 70–99)
Potassium: 3.6 mmol/L (ref 3.5–5.1)
Sodium: 137 mmol/L (ref 135–145)
Total Bilirubin: 1 mg/dL (ref 0.3–1.2)
Total Protein: 6.9 g/dL (ref 6.5–8.1)

## 2020-01-21 LAB — I-STAT BETA HCG BLOOD, ED (MC, WL, AP ONLY): I-stat hCG, quantitative: <5 m[IU]/mL (ref <5)

## 2020-01-21 LAB — I-STAT CHEM 8, ED
BUN: 14 mg/dL (ref 6–20)
Calcium, Ion: 1.04 mmol/L — ABNORMAL LOW (ref 1.15–1.40)
Chloride: 94 mmol/L — ABNORMAL LOW (ref 98–111)
Creatinine, Ser: 1.1 mg/dL — ABNORMAL HIGH (ref 0.44–1.00)
Glucose, Bld: 89 mg/dL (ref 70–99)
HCT: 44 % (ref 36.0–46.0)
Hemoglobin: 15 g/dL (ref 12.0–15.0)
Potassium: 3.6 mmol/L (ref 3.5–5.1)
Sodium: 135 mmol/L (ref 135–145)
TCO2: 28 mmol/L (ref 22–32)

## 2020-01-21 LAB — DIFFERENTIAL
Abs Immature Granulocytes: 0.04 10*3/uL (ref 0.00–0.07)
Basophils Absolute: 0.1 10*3/uL (ref 0.0–0.1)
Basophils Relative: 1 %
Eosinophils Absolute: 0 10*3/uL (ref 0.0–0.5)
Eosinophils Relative: 0 %
Immature Granulocytes: 0 %
Lymphocytes Relative: 25 %
Lymphs Abs: 2.4 10*3/uL (ref 0.7–4.0)
Monocytes Absolute: 0.4 10*3/uL (ref 0.1–1.0)
Monocytes Relative: 4 %
Neutro Abs: 6.7 10*3/uL (ref 1.7–7.7)
Neutrophils Relative %: 70 %

## 2020-01-21 LAB — PROTIME-INR
INR: 0.9 (ref 0.8–1.2)
Prothrombin Time: 12.2 seconds (ref 11.4–15.2)

## 2020-01-21 LAB — CBG MONITORING, ED: Glucose-Capillary: 92 mg/dL (ref 70–99)

## 2020-01-21 MED ORDER — SODIUM CHLORIDE 0.9% FLUSH
3.0000 mL | Freq: Once | INTRAVENOUS | Status: DC
Start: 2020-01-21 — End: 2020-01-22

## 2020-01-21 NOTE — ED Triage Notes (Signed)
Pt arrives via [facility] with c/o slurred speech for 2 days. Takes potassium pills, ran out for 2 days. Today, she took double dose (took 72meq today) when she got it filled today. She says that when her K+ gets low that these symptoms happen.  Daughter says speech is worse today and "talking out of her head" En route,  Alert and oriented, slurred speech, no focal weakness, ambulatory independently, no facial drooping. 112/76, Hr 90, CBG 95, SPO2 98%.

## 2020-01-21 NOTE — ED Triage Notes (Signed)
Pt says that she has had slurred speech since Wednesday. She says her daughter called [phone] because the patients eyes "looked different" No obvious focal weakness, pt does take time to answer some questions, but denies that she is having difficulty speaking. She says that all of this is caused by her low potassium. Reports hx of stroke in 2005.

## 2020-01-22 ENCOUNTER — Encounter (HOSPITAL_COMMUNITY): Payer: Self-pay | Admitting: Emergency Medicine

## 2020-01-22 ENCOUNTER — Emergency Department (HOSPITAL_COMMUNITY): Payer: BC Managed Care – PPO

## 2020-01-22 LAB — URINALYSIS, ROUTINE W REFLEX MICROSCOPIC
Bilirubin Urine: NEGATIVE
Glucose, UA: NEGATIVE mg/dL
Hgb urine dipstick: NEGATIVE
Ketones, ur: NEGATIVE mg/dL
Leukocytes,Ua: NEGATIVE
Nitrite: NEGATIVE
Protein, ur: NEGATIVE mg/dL
Specific Gravity, Urine: 1.02 (ref 1.005–1.030)
pH: 6 (ref 5.0–8.0)

## 2020-01-22 LAB — RAPID URINE DRUG SCREEN, HOSP PERFORMED
Amphetamines: POSITIVE — AB
Barbiturates: NOT DETECTED
Benzodiazepines: NOT DETECTED
Cocaine: NOT DETECTED
Opiates: NOT DETECTED
Tetrahydrocannabinol: NOT DETECTED

## 2020-01-22 MED ORDER — ONDANSETRON 4 MG PO TBDP
8.0000 mg | ORAL_TABLET | Freq: Once | ORAL | Status: AC
  Administered 2020-01-22: 8 mg via ORAL
  Filled 2020-01-22: qty 2

## 2020-01-22 MED ORDER — METOCLOPRAMIDE HCL 5 MG/ML IJ SOLN
10.0000 mg | Freq: Once | INTRAMUSCULAR | Status: AC
  Administered 2020-01-22: 10 mg via INTRAMUSCULAR
  Filled 2020-01-22: qty 2

## 2020-01-22 MED ORDER — ONDANSETRON HCL 4 MG/2ML IJ SOLN
4.0000 mg | Freq: Once | INTRAMUSCULAR | Status: DC
  Filled 2020-01-22: qty 2

## 2020-01-22 NOTE — ED Notes (Signed)
Attempted to call patient mother [phone] [facility] no answer

## 2020-01-22 NOTE — ED Notes (Signed)
Patient transported to MRI 

## 2020-01-22 NOTE — ED Notes (Signed)
Ambulated pt to BR. Pt ambulated with steady gait with no assistance.

## 2020-01-22 NOTE — ED Notes (Signed)
Patient has returned from MRI. Patient attempted to provide urine sample just states she just voided prior to MRI. Ambulatory to bathroom with steady gait

## 2020-01-22 NOTE — ED Provider Notes (Signed)
[facility] EMERGENCY DEPARTMENT Provider Note   CSN: [id number] Arrival date & time: 01/21/20  2142     History No chief complaint on file.   [name] [name] is a 49 y.o. female.  The history is provided by the patient.  Illness Location:  At home  Quality:  Slurred speech x 2 days and "funny looking eyes"  Severity:  Moderate Onset quality:  Gradual Duration:  2 days Timing:  Constant Progression:  Unchanged Chronicity:  New Context:  On multiple pain medications for chronic pain  Relieved by:  Nothing Worsened by:  Nothing  Ineffective treatments:  None tried  Associated symptoms: no abdominal pain, no chest pain, no congestion, no cough, no diarrhea, no ear pain, no fatigue, no fever, no headaches, no loss of consciousness, no myalgias, no nausea, no rash, no rhinorrhea, no shortness of breath, no sore throat, no vomiting and no wheezing   Patient with remote h/o stroke and on multiple pain medications for chronic pain presents with slurred speech.       Past Medical History:  Diagnosis Date  . Anxiety   . Blood transfusion without reported diagnosis   . Coronary artery disease   . Hyperlipidemia   . Hypertension   . Iron deficiency anemia 01/15/2019  . 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 Psoriatic arthritis ([place])   . Stroke [facility]) 2014    Patient Active Problem List   Diagnosis Date Noted  . Nausea and vomiting 11/20/2019  . Syncope 11/20/2019  . Dehydration 11/20/2019  . Transaminitis 11/20/2019  . Lactic acidosis 11/20/2019  . Acute hyponatremia 11/20/2019  . AKI (acute kidney injury) ([place]) 11/19/2019  . Bone spur of right foot 09/01/2019  . Pain from implanted hardware 09/01/2019  . Acquired pes planovalgus of right foot 09/01/2019  . Person under investigation for COVID-19   . Acute respiratory failure with hypoxia ([place]) 08/12/2019  . COVID-19 virus  infection: Presumed 08/12/2019  . Community acquired pneumonia 08/11/2019  . Multifocal pneumonia 08/10/2019  . Iron deficiency anemia 01/15/2019  . B12 deficiency 01/01/2019  . Immunosuppressed status ([place]) 12/31/2018  . Myoclonic jerking 12/29/2018  . Generalized anxiety disorder 08/04/2018  . Nontraumatic complete tear of right rotator cuff 07/08/2018  . Obesity with serious comorbidity 01/17/2018  . Normochromic normocytic anemia 12/24/2017  . 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 Right leg swelling 02/21/2017  . Asthmatic bronchitis with acute exacerbation 02/15/2017  . Noncompliance with treatment plan 12/27/2016  . Synovitis of right foot 12/04/2016  . Ganglion of foot, right 11/09/2016  . Hypokalemia 10/12/2016  . Hyperglycemia 10/11/2016  . IFG (impaired fasting glucose) 10/11/2016  . Lumbar trigger point syndrome 03/13/2016  . Pap smear for cervical cancer screening 02/17/2016  . Elevated liver enzymes 01/27/2016  . Long term (current) use of antibiotics 01/27/2016  . Cellulitis of left lower limb 01/03/2016  . Wound dehiscence 01/03/2016  . Loose total hip arthroplasty ([place]) 12/20/2015  . Foot pain, right 09/09/2015  . Screening for breast cancer 02/16/2015  . Left axillary hidradenitis 02/16/2015  . Epidermal inclusion cyst 01/11/2015  . Urticaria 06/22/2014  . Abnormal finding of blood chemistry 02/23/2014  . Benign essential hypertension 02/23/2014  . Brain neoplasm ([place]) 02/23/2014  . 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 Edema 02/23/2014  . Elevated blood-pressure reading  without diagnosis of hypertension 02/23/2014  . Elevated WBC count 02/23/2014  . Hearing loss of both ears 02/23/2014  . High risk medication use 02/23/2014  . Hip joint painful on movement 02/23/2014  . Mixed hyperlipidemia 02/23/2014  . Dislocation of right patella 02/23/2014    . Irritable bowel syndrome 02/23/2014  . Primary osteoarthritis involving multiple joints 02/23/2014  . Sudden idiopathic hearing loss 02/23/2014  . Primary osteoarthritis of left knee 02/23/2014  . Primary osteoarthritis of right knee 02/23/2014  . Osteoarthritis 02/23/2014  . Internal derangement of left knee 02/23/2014  . Pain in joint, pelvic region and thigh 02/23/2014  . Pain in joint, ankle and foot 02/23/2014  . Coccydynia 02/23/2014  . Right-sided low back pain with sciatica 02/19/2014  . 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 Fall at home 10/04/2012  . Acute ischemic stroke ([place]) 01/19/2012  . Hypertension 01/19/2012  . Anxiety 01/19/2012  . Chronic knee pain 01/19/2012  . Restless legs syndrome 09/05/2010  . HYPERSOMNIA 06/04/2010    Past Surgical History:  Procedure Laterality Date  . CARPAL TUNNEL RELEASE     B/L hand  . CHOLECYSTECTOMY    . FOOT SURGERY     with hardware   . HERNIA REPAIR    . KNEE ARTHROSCOPY Bilateral 08/2014   [facility] Dr. Len Childs  . TEE WITHOUT CARDIOVERSION  01/22/2012   Procedure: TRANSESOPHAGEAL ECHOCARDIOGRAM (TEE);  Surgeon: Lelon Perla, MD;  Location[facility] ENDOSCOPY;  Service: Cardiovascular;  Laterality: N/A;  . TOTAL HIP ARTHROPLASTY Bilateral      OB History   No obstetric history on file.     Family History  Problem Relation Age of Onset  . Hypertension Mother   . Hypertension Father   . Colon cancer Maternal Grandfather   . Colon cancer Paternal Grandfather   . Colon polyps Neg Hx   . Esophageal cancer Neg Hx   . Rectal cancer Neg Hx   . Stomach cancer Neg Hx     Social History   Tobacco Use  . Smoking status: Former Research scientist (life sciences)  . Smokeless tobacco: Never Used  . Tobacco comment: quit 10 years ago, smoked intermittently for few years. Less than 10 yrs total.  Substance Use Topics  . Alcohol use: Yes    Comment: occ- 1-2 drinks a week  . Drug use: No    Home Medications Prior to Admission  medications   Medication Sig Start Date End Date Taking? Authorizing Provider  Abatacept (ORENCIA IV) Inject into the vein. Psoriatic arthristis every 4 weeks    [provider]  aspirin 325 MG EC tablet Take 325 mg by mouth daily.     [provider]  atorvastatin (LIPITOR) 40 MG tablet Take 40 mg by mouth daily. 07/06/19   [provider]  Buprenorphine HCl (BELBUCA) 450 MCG FILM Place 450 mcg inside cheek in the morning and at bedtime.  09/04/19   [provider]  escitalopram (LEXAPRO) 20 MG tablet Take 20 mg by mouth daily.    [provider]  pantoprazole (PROTONIX) 40 MG tablet Take 40 mg by mouth daily as needed (indigestion).  09/14/19   [provider]  pregabalin (LYRICA) 75 MG capsule Take 150 mg by mouth 3 (three) times daily.     [provider]  traZODone (DESYREL) 50 MG tablet TAKE 1 TABLET BY MOUTH ONE HOUR BEFORE BEDTIME 09/09/19 09/22/19  [provider]    Allergies    Nsaids and Benzoin  Review of  Systems   Review of Systems  Constitutional: Negative for fatigue and fever.  HENT: Negative for congestion, ear pain, rhinorrhea and sore throat.   Eyes: Negative for photophobia, pain and visual disturbance.  Respiratory: Negative for cough, shortness of breath and wheezing.   Cardiovascular: Negative for chest pain.  Gastrointestinal: Negative for abdominal pain, diarrhea, nausea and vomiting.  Genitourinary: Negative for difficulty urinating.  Musculoskeletal: Negative for myalgias.  Skin: Negative for rash.  Neurological: Positive for speech difficulty. Negative for dizziness, tremors, seizures, loss of consciousness, syncope, facial asymmetry, weakness, light-headedness, numbness and headaches.  Psychiatric/Behavioral: Negative for agitation, dysphoric mood, hallucinations and self-injury. The patient is not nervous/anxious.   All other systems reviewed and are negative.   Physical Exam Updated Vital  Signs BP 129/84 (BP Location: Right Arm)   Pulse 86   Temp 98 F (36.7 C) (Oral)   Resp 20   Ht 5\' 4"  (1.626 m)   Wt 93 kg   SpO2 96%   BMI 35.19 kg/m   Physical Exam Vitals and nursing note reviewed.  Constitutional:      Appearance: Normal appearance.     Comments: Somnolent but easily arousible   HENT:     Head: Normocephalic and atraumatic.     Nose: Nose normal.     Mouth/Throat:     Mouth: Mucous membranes are moist.     Pharynx: Oropharynx is clear.  Eyes:     Extraocular Movements: Extraocular movements intact.     Pupils: Pupils are equal, round, and reactive to light.  Cardiovascular:     Rate and Rhythm: Normal rate and regular rhythm.     Pulses: Normal pulses.     Heart sounds: Normal heart sounds.  Pulmonary:     Effort: Pulmonary effort is normal.     Breath sounds: Normal breath sounds.  Abdominal:     General: Abdomen is flat. Bowel sounds are normal.     Tenderness: There is no abdominal tenderness. There is no guarding or rebound.  Musculoskeletal:        General: Normal range of motion.     Cervical back: Normal range of motion and neck supple.  Skin:    General: Skin is warm and dry.     Capillary Refill: Capillary refill takes less than 2 seconds.  Neurological:     General: No focal deficit present.     Mental Status: She is alert and oriented to person, place, and time.     Cranial Nerves: No cranial nerve deficit.     Deep Tendon Reflexes: Reflexes normal.  Psychiatric:        Mood and Affect: Mood normal.        Behavior: Behavior normal.     ED Results / Procedures / Treatments   Labs (all labs ordered are listed, but only abnormal results are displayed) Labs Reviewed  APTT - Abnormal; Notable for the following components:      Result Value   aPTT 23 (*)    All other components within normal limits  COMPREHENSIVE METABOLIC PANEL - Abnormal; Notable for the following components:   Chloride 95 (*)    All other components within  normal limits  RAPID URINE DRUG SCREEN, HOSP PERFORMED - Abnormal; Notable for the following components:   Amphetamines POSITIVE (*)    All other components within normal limits  URINALYSIS, ROUTINE W REFLEX MICROSCOPIC - Abnormal; Notable for the following components:   APPearance CLOUDY (*)    All other  components within normal limits  I-STAT CHEM 8, ED - Abnormal; Notable for the following components:   Chloride 94 (*)    Creatinine, Ser 1.10 (*)    Calcium, Ion 1.04 (*)    All other components within normal limits  PROTIME-INR  CBC  DIFFERENTIAL  CBG MONITORING, ED  I-STAT BETA HCG BLOOD, ED ([facility], [facility], [facility] ONLY)    EKG EKG Interpretation  Date/Time:  Thursday Jan 21 2020 21:52:27 EDT Ventricular Rate:  99 PR Interval:  142 QRS Duration: 84 QT Interval:  372 QTC Calculation: 477 R Axis:   49 Text Interpretation: Normal sinus rhythm Confirmed by Randal Buba, Tynleigh Birt ([id number]) on 01/22/2020 1:26:11 AM   Radiology CT HEAD WO CONTRAST  Result Date: 01/21/2020 CLINICAL DATA:  Slurred speech. EXAM: CT HEAD WITHOUT CONTRAST TECHNIQUE: Contiguous axial images were obtained from the base of the skull through the vertex without intravenous contrast. COMPARISON:  November 19, 2019 FINDINGS: Brain: No evidence of acute infarction, hemorrhage, hydrocephalus, extra-axial collection or mass lesion/mass effect. A chronic right basal ganglia lacunar infarct is seen. Vascular: No hyperdense vessel or unexpected calcification. Skull: Normal. Negative for fracture or focal lesion. Sinuses/Orbits: Stable moderate severity anterior left maxillary sinus mucosal thickening is seen. Other: None. IMPRESSION: 1. No acute intracranial abnormality. 2. Chronic right basal ganglia lacunar infarct. 3. Stable moderate severity anterior left maxillary sinus mucosal thickening. Electronically Signed   By: Virgina Norfolk M.D.   On: 01/21/2020 23:15   MR BRAIN WO CONTRAST  Result Date: 01/22/2020 CLINICAL DATA:  Initial  evaluation for acute slurred speech. EXAM: MRI HEAD WITHOUT CONTRAST TECHNIQUE: Multiplanar, multiecho pulse sequences of the brain and surrounding structures were obtained without intravenous contrast. COMPARISON:  Prior CT from 01/21/2020. FINDINGS: Brain: Cerebral volume within normal limits for age. No significant cerebral white matter disease. Remote lacunar infarct present at the posterior right lentiform nucleus. Associated chronic hemosiderin staining. No abnormal foci of restricted diffusion to suggest acute or subacute ischemia. Gray-white matter differentiation maintained. No other areas of encephalomalacia to suggest chronic cortical infarction. No other evidence for acute or chronic intracranial hemorrhage. No mass lesion, midline shift or mass effect. No hydrocephalus or extra-axial fluid collection. Pituitary gland suprasellar region normal. Midline structures intact. Vascular: Major intracranial vascular flow voids are maintained. Skull and upper cervical spine: Craniocervical junction within normal limits. Signal intensity within the visualized bone marrow diffusely decreased on T1 weighted imaging, nonspecific, but most commonly related to anemia, smoking, or obesity. No focal marrow replacing lesion. No scalp soft tissue abnormality. Sinuses/Orbits: Globes and orbital soft tissues within normal limits. Probable mucocele at the left frontoethmoidal recess noted. Paranasal sinuses are otherwise clear. No mastoid effusion. Inner ear structures grossly normal. Other: None. IMPRESSION: 1. No acute intracranial abnormality. 2. Remote right basal ganglia lacunar infarct. Electronically Signed   By: Jeannine Boga M.D.   On: 01/22/2020 03:09    Procedures Procedures (including critical care time)  Medications Ordered in ED Medications  sodium chloride flush (NS) 0.9 % injection 3 mL (has no administration in time range)    ED Course  I have reviewed the triage vital signs and the nursing  notes.  Pertinent labs & imaging results that were available during my care of the patient were reviewed by me and considered in my medical decision making (see chart for details). No SI or HI no AH nor VI.  Denies Overdose.    Patient is on narcotics, lyrica, benzos with an OD score of 560 on PMP  and has amphetamines in her urine that she does legally hold a prescription for.  I suspect this is an accidental overdose and polypharmacy.  Recommend stopping the quantities of meds she is getting.  No cva.   Walked and Po challenged.   [name] [name] was evaluated in Emergency Department on 01/22/2020 for the symptoms described in the history of present illness. She was evaluated in the context of the global COVID-19 pandemic, which necessitated consideration that the patient might be at risk for infection with the SARS-CoV-2 virus that causes COVID-19. Institutional protocols and algorithms that pertain to the evaluation of patients at risk for COVID-19 are in a state of rapid change based on information released by regulatory bodies including the CDC and federal and state organizations. These policies and algorithms were followed during the patient's care in the ED.  Final Clinical Impression(s) / ED Diagnoses Return for intractable cough, coughing up blood,fevers >100.4 unrelieved by medication, shortness of breath, intractable vomiting, chest pain, shortness of breath, weakness,numbness, changes in speech, facial asymmetry,abdominal pain, passing out,Inability to tolerate liquids or food, cough, altered mental status or any concerns. No signs of systemic illness or infection. The patient is nontoxic-appearing on exam and vital signs are within normal limits.   I have reviewed the triage vital signs and the nursing notes. Pertinent labs &imaging results that were available during my care of the patient were reviewed by me and considered in my medical decision making (see chart for  details).After history, exam, and medical workup I feel the patient has beenappropriately medically screened and is safe for discharge home. Pertinent diagnoses were discussed with the patient. Patient was given return precautions.   Alando Colleran, MD 01/22/20 [phone]

## 2020-01-24 ENCOUNTER — Emergency Department (HOSPITAL_COMMUNITY): Payer: BC Managed Care – PPO

## 2020-01-24 ENCOUNTER — Observation Stay (HOSPITAL_COMMUNITY)
Admission: EM | Admit: 2020-01-24 | Discharge: 2020-01-26 | Disposition: A | Discharge status: Home / Self Care | Payer: BC Managed Care – PPO | Attending: Internal Medicine | Admitting: Internal Medicine

## 2020-01-24 ENCOUNTER — Encounter (HOSPITAL_COMMUNITY): Payer: Self-pay | Admitting: Internal Medicine

## 2020-01-24 ENCOUNTER — Other Ambulatory Visit: Payer: Self-pay

## 2020-01-24 DIAGNOSIS — R112 Nausea with vomiting, unspecified: Secondary | ICD-10-CM | POA: Diagnosis not present

## 2020-01-24 DIAGNOSIS — E860 Dehydration: Secondary | ICD-10-CM | POA: Insufficient documentation

## 2020-01-24 DIAGNOSIS — R197 Diarrhea, unspecified: Secondary | ICD-10-CM | POA: Diagnosis not present

## 2020-01-24 DIAGNOSIS — R748 Abnormal levels of other serum enzymes: Secondary | ICD-10-CM | POA: Diagnosis not present

## 2020-01-24 DIAGNOSIS — Z888 Allergy status to other drugs, medicaments and biological substances status: Secondary | ICD-10-CM | POA: Diagnosis not present

## 2020-01-24 DIAGNOSIS — D509 Iron deficiency anemia, unspecified: Secondary | ICD-10-CM | POA: Diagnosis not present

## 2020-01-24 DIAGNOSIS — Z886 Allergy status to analgesic agent status: Secondary | ICD-10-CM | POA: Insufficient documentation

## 2020-01-24 DIAGNOSIS — E876 Hypokalemia: Secondary | ICD-10-CM | POA: Diagnosis present

## 2020-01-24 DIAGNOSIS — L4050 Arthropathic psoriasis, unspecified: Secondary | ICD-10-CM | POA: Diagnosis not present

## 2020-01-24 DIAGNOSIS — R9431 Abnormal electrocardiogram [ECG] [EKG]: Secondary | ICD-10-CM | POA: Diagnosis not present

## 2020-01-24 DIAGNOSIS — Z20822 Contact with and (suspected) exposure to covid-19: Secondary | ICD-10-CM | POA: Diagnosis not present

## 2020-01-24 DIAGNOSIS — R945 Abnormal results of liver function studies: Secondary | ICD-10-CM | POA: Insufficient documentation

## 2020-01-24 DIAGNOSIS — I2510 Atherosclerotic heart disease of native coronary artery without angina pectoris: Secondary | ICD-10-CM | POA: Insufficient documentation

## 2020-01-24 DIAGNOSIS — Z87891 Personal history of nicotine dependence: Secondary | ICD-10-CM | POA: Diagnosis not present

## 2020-01-24 DIAGNOSIS — I10 Essential (primary) hypertension: Secondary | ICD-10-CM | POA: Diagnosis present

## 2020-01-24 DIAGNOSIS — Z79899 Other long term (current) drug therapy: Secondary | ICD-10-CM | POA: Diagnosis not present

## 2020-01-24 DIAGNOSIS — Z7982 Long term (current) use of aspirin: Secondary | ICD-10-CM | POA: Insufficient documentation

## 2020-01-24 DIAGNOSIS — G8929 Other chronic pain: Secondary | ICD-10-CM | POA: Insufficient documentation

## 2020-01-24 DIAGNOSIS — Z96643 Presence of artificial hip joint, bilateral: Secondary | ICD-10-CM | POA: Insufficient documentation

## 2020-01-24 DIAGNOSIS — E785 Hyperlipidemia, unspecified: Secondary | ICD-10-CM | POA: Insufficient documentation

## 2020-01-24 DIAGNOSIS — F419 Anxiety disorder, unspecified: Secondary | ICD-10-CM | POA: Diagnosis not present

## 2020-01-24 DIAGNOSIS — Z9049 Acquired absence of other specified parts of digestive tract: Secondary | ICD-10-CM | POA: Diagnosis not present

## 2020-01-24 LAB — URINALYSIS, ROUTINE W REFLEX MICROSCOPIC
Bacteria, UA: NONE SEEN
Bilirubin Urine: NEGATIVE
Glucose, UA: NEGATIVE mg/dL
Ketones, ur: NEGATIVE mg/dL
Leukocytes,Ua: NEGATIVE
Nitrite: NEGATIVE
Protein, ur: NEGATIVE mg/dL
Specific Gravity, Urine: 1.012 (ref 1.005–1.030)
pH: 5 (ref 5.0–8.0)

## 2020-01-24 LAB — TROPONIN I (HIGH SENSITIVITY)
Troponin I (High Sensitivity): 4 ng/L (ref <18)
Troponin I (High Sensitivity): 4 ng/L (ref <18)

## 2020-01-24 LAB — COMPREHENSIVE METABOLIC PANEL
ALT: 51 U/L — ABNORMAL HIGH (ref 0–44)
AST: 68 U/L — ABNORMAL HIGH (ref 15–41)
Albumin: 4.4 g/dL (ref 3.5–5.0)
Alkaline Phosphatase: 86 U/L (ref 38–126)
Anion gap: 20 — ABNORMAL HIGH (ref 5–15)
BUN: 20 mg/dL (ref 6–20)
CO2: 22 mmol/L (ref 22–32)
Calcium: 8.2 mg/dL — ABNORMAL LOW (ref 8.9–10.3)
Chloride: 92 mmol/L — ABNORMAL LOW (ref 98–111)
Creatinine, Ser: 0.94 mg/dL (ref 0.44–1.00)
GFR calc Af Amer: >60 mL/min (ref >60)
GFR calc non Af Amer: >60 mL/min (ref >60)
Glucose, Bld: 107 mg/dL — ABNORMAL HIGH (ref 70–99)
Potassium: 3.3 mmol/L — ABNORMAL LOW (ref 3.5–5.1)
Sodium: 134 mmol/L — ABNORMAL LOW (ref 135–145)
Total Bilirubin: 0.9 mg/dL (ref 0.3–1.2)
Total Protein: 7.3 g/dL (ref 6.5–8.1)

## 2020-01-24 LAB — RAPID URINE DRUG SCREEN, HOSP PERFORMED
Amphetamines: POSITIVE — AB
Barbiturates: NOT DETECTED
Benzodiazepines: NOT DETECTED
Cocaine: NOT DETECTED
Opiates: NOT DETECTED
Tetrahydrocannabinol: NOT DETECTED

## 2020-01-24 LAB — CBC
HCT: 46.3 % — ABNORMAL HIGH (ref 36.0–46.0)
Hemoglobin: 15.5 g/dL — ABNORMAL HIGH (ref 12.0–15.0)
MCH: 32.4 pg (ref 26.0–34.0)
MCHC: 33.5 g/dL (ref 30.0–36.0)
MCV: 96.9 fL (ref 80.0–100.0)
Platelets: 331 10*3/uL (ref 150–400)
RBC: 4.78 MIL/uL (ref 3.87–5.11)
RDW: 13.7 % (ref 11.5–15.5)
WBC: 7.9 10*3/uL (ref 4.0–10.5)
nRBC: 0 % (ref 0.0–0.2)

## 2020-01-24 LAB — SARS CORONAVIRUS 2 BY RT PCR (HOSPITAL ORDER, PERFORMED IN ~~LOC~~ HOSPITAL LAB): SARS Coronavirus 2: NEGATIVE

## 2020-01-24 LAB — I-STAT BETA HCG BLOOD, ED (MC, WL, AP ONLY): I-stat hCG, quantitative: <5 m[IU]/mL (ref <5)

## 2020-01-24 LAB — LIPASE, BLOOD: Lipase: 23 U/L (ref 11–51)

## 2020-01-24 MED ORDER — LORAZEPAM 2 MG/ML IJ SOLN
1.0000 mg | Freq: Once | INTRAMUSCULAR | Status: AC
  Administered 2020-01-24: 1 mg via INTRAVENOUS
  Filled 2020-01-24: qty 1

## 2020-01-24 MED ORDER — METOCLOPRAMIDE HCL 5 MG/ML IJ SOLN
5.0000 mg | Freq: Four times a day (QID) | INTRAMUSCULAR | Status: DC | PRN
  Administered 2020-01-24 – 2020-01-26 (×6): 5 mg via INTRAVENOUS
  Filled 2020-01-24 (×6): qty 2

## 2020-01-24 MED ORDER — PROMETHAZINE HCL 25 MG/ML IJ SOLN
25.0000 mg | Freq: Once | INTRAMUSCULAR | Status: AC
  Administered 2020-01-24: 25 mg via INTRAVENOUS
  Filled 2020-01-24: qty 1

## 2020-01-24 MED ORDER — ATORVASTATIN CALCIUM 40 MG PO TABS
40.0000 mg | ORAL_TABLET | Freq: Every day | ORAL | Status: DC
  Administered 2020-01-25 – 2020-01-26 (×2): 40 mg via ORAL
  Filled 2020-01-24 (×2): qty 1

## 2020-01-24 MED ORDER — POTASSIUM CHLORIDE 10 MEQ/100ML IV SOLN
10.0000 meq | Freq: Once | INTRAVENOUS | Status: AC
  Administered 2020-01-24: 10 meq via INTRAVENOUS
  Filled 2020-01-24: qty 100

## 2020-01-24 MED ORDER — SODIUM CHLORIDE 0.9 % IV SOLN: INTRAVENOUS | Status: DC

## 2020-01-24 MED ORDER — POTASSIUM CHLORIDE 10 MEQ/100ML IV SOLN
10.0000 meq | INTRAVENOUS | Status: AC
  Administered 2020-01-24 (×2): 10 meq via INTRAVENOUS
  Filled 2020-01-24 (×2): qty 100

## 2020-01-24 MED ORDER — SODIUM CHLORIDE 0.9 % IV BOLUS
1000.0000 mL | Freq: Once | INTRAVENOUS | Status: AC
  Administered 2020-01-24: 1000 mL via INTRAVENOUS

## 2020-01-24 MED ORDER — HYDROCODONE-ACETAMINOPHEN 5-325 MG PO TABS
1.0000 | ORAL_TABLET | ORAL | Status: DC | PRN
  Administered 2020-01-25 (×2): 1 via ORAL
  Administered 2020-01-26 (×2): 2 via ORAL
  Administered 2020-01-26: 1 via ORAL
  Filled 2020-01-24 (×3): qty 1
  Filled 2020-01-24 (×2): qty 2

## 2020-01-24 MED ORDER — BUPRENORPHINE HCL 450 MCG BU FILM: 450.0000 ug | ORAL_FILM | Freq: Two times a day (BID) | BUCCAL | Status: DC

## 2020-01-24 MED ORDER — ACETAMINOPHEN 650 MG RE SUPP: 650.0000 mg | Freq: Four times a day (QID) | RECTAL | Status: DC | PRN

## 2020-01-24 MED ORDER — SODIUM CHLORIDE 0.9% FLUSH
3.0000 mL | Freq: Once | INTRAVENOUS | Status: AC
  Administered 2020-01-24: 3 mL via INTRAVENOUS

## 2020-01-24 MED ORDER — ONDANSETRON 4 MG PO TBDP
4.0000 mg | ORAL_TABLET | Freq: Once | ORAL | Status: AC | PRN
  Administered 2020-01-24: 4 mg via ORAL
  Filled 2020-01-24: qty 1

## 2020-01-24 MED ORDER — SODIUM CHLORIDE 0.9% FLUSH: 3.0000 mL | Freq: Two times a day (BID) | INTRAVENOUS | Status: DC

## 2020-01-24 MED ORDER — ACETAMINOPHEN 325 MG PO TABS: 650.0000 mg | ORAL_TABLET | Freq: Four times a day (QID) | ORAL | Status: DC | PRN

## 2020-01-24 NOTE — H&P (Signed)
[name] [name] [id number] DOB: 08-10-1971 DOA: 01/24/2020     PCP: Robyne Peers, MD   Outpatient Specialists:   She is followed up at pain clinic Hematology Robyne Peers, MD  Patient arrived to ER on 01/24/20 at 1530  Patient coming from: home Lives With family    Chief Complaint:   Chief Complaint  Patient presents with  . Nausea    HPI: [name] [name] is a 49 y.o. female with medical history significant of anxiety, coronary artery disease, HLD, HTN, iron deficiency anemia, psoriatic arthritis, stroke    Presented with persistent nausea and vomiting.  She was seen in emergency department 2 days ago for slurred speech patient was on narcotics Lyrica benzos and amphetamines noted in her urine felt secondary to accidental overdose and polypharmacy. She has been feeling generally weak has had nausea ever since she got out of the emergency department on 21 Patient has known history of chronic pain for which she takes Belbuca reports she only takes it 1 time at night She reports that she has been taking Adderall occasionally with some has been prescribed to her 7 years ago and reports was dose was yesterday does not remember when was the last dose prior to this. She was also taking Lyrica, Trazodone  Upon review of records do seem to the patient has been admitted if possible enteritis on March 2021 a 3 to 4 days of nausea and vomiting similar to recent episode CT scan of the time showed possible  enteritis She was treated with Ativan for nausea given prolonged QTC and improved Infectious risk factors:  Reports  N/V/Diarrhea/abdominal pain,     KNOWN COVID POSITIVE   Has  been vaccinated against COVID in March  In  [place]     Lab Results  Component Value Date   [place] 01/24/2020   [place] NEGATIVE 11/19/2019   [place] RESULT: NEGATIVE 10/30/2019   [place] NEGATIVE 08/12/2019    Regarding pertinent  Chronic problems:     Hyperlipidemia -  on statins Lipitor   HTN on hydrochlorothiazide Cozaar  History of psoriatic arthritis immunosuppressant Abatacept.  chronic iron deficiency anemia suspected to be diminished gastrointestinal absorption of enteric iron.   Followed by hematology   CAD  - On Aspirin, statin,      Hx of CVA -  with/out residual deficits on Aspirin  325,    While in ER: Noted to have prolonged QTC nausea and vomiting intractable  Hospitalist was called for admission for    The following Work up has been ordered so far:  Orders Placed This Encounter  Procedures  . SARS Coronavirus 2 by RT PCR (hospital order, performed in [facility] hospital lab) Nasopharyngeal Nasopharyngeal Swab  . Lipase, blood  . Comprehensive metabolic panel  . CBC  . Urinalysis, Routine w reflex microscopic  . Rapid urine drug screen (hospital performed)  . Diet NPO time specified  . Saline Lock IV, Maintain IV access  . Consult to hospitalist  ALL PATIENTS BEING ADMITTED/HAVING PROCEDURES NEED COVID-19 SCREENING  . I-Stat beta hCG blood, ED  . ED EKG    Following Medications were ordered in ER: Medications  sodium chloride flush (NS) 0.9 % injection 3 mL (3 mLs Intravenous Given 01/24/20 1606)  ondansetron (ZOFRAN-ODT) disintegrating tablet 4 mg (4 mg Oral Given 01/24/20 1558)  sodium chloride 0.9 % bolus 1,000 mL (0 mLs Intravenous Stopped 01/24/20 1828)  promethazine (PHENERGAN) injection 25  mg (25 mg Intravenous Given 01/24/20 1635)  sodium chloride 0.9 % bolus 1,000 mL (1,000 mLs Intravenous New Bag/Given 01/24/20 1829)  LORazepam (ATIVAN) injection 1 mg (1 mg Intravenous Given 01/24/20 1833)  potassium chloride 10 mEq in 100 mL IVPB (10 mEq Intravenous New Bag/Given 01/24/20 1850)        Consult Orders  (From admission, onward)         Start     Ordered   01/24/20 1956  Consult to hospitalist  ALL PATIENTS BEING ADMITTED/HAVING PROCEDURES NEED COVID-19 SCREENING  Once      Comments: ALL PATIENTS BEING ADMITTED/HAVING PROCEDURES NEED COVID-19 SCREENING  Provider:  (Not yet assigned)  Question Answer Comment  Place call to: [facility]   Reason for Consult Admit      01/24/20 1957           Significant initial  Findings: Abnormal Labs Reviewed  COMPREHENSIVE METABOLIC PANEL - Abnormal; Notable for the following components:      Result Value   Sodium 134 (*)    Potassium 3.3 (*)    Chloride 92 (*)    Glucose, Bld 107 (*)    Calcium 8.2 (*)    AST 68 (*)    ALT 51 (*)    Anion gap 20 (*)    All other components within normal limits  CBC - Abnormal; Notable for the following components:   Hemoglobin 15.5 (*)    HCT 46.3 (*)    All other components within normal limits  URINALYSIS, ROUTINE W REFLEX MICROSCOPIC - Abnormal; Notable for the following components:   APPearance HAZY (*)    Hgb urine dipstick SMALL (*)    All other components within normal limits  RAPID URINE DRUG SCREEN, HOSP PERFORMED - Abnormal; Notable for the following components:   Amphetamines POSITIVE (*)    All other components within normal limits     Otherwise labs showing:    Recent Labs  Lab 01/21/20 2204 01/21/20 2213 01/24/20 1541  NA 137 135 134*  K 3.6 3.6 3.3*  CO2 27  --  22  GLUCOSE 90 89 107*  BUN 14 14 20   CREATININE 0.80 1.10* 0.94  CALCIUM 9.0  --  8.2*    Cr    Stable,  Lab Results  Component Value Date   CREATININE 0.94 01/24/2020   CREATININE 1.10 (H) 01/21/2020   CREATININE 0.80 01/21/2020    Recent Labs  Lab 01/21/20 2204 01/24/20 1541  AST 31 68*  ALT 29 51*  ALKPHOS 83 86  BILITOT 1.0 0.9  PROT 6.9 7.3  ALBUMIN 4.2 4.4   Lab Results  Component Value Date   CALCIUM 8.2 (L) 01/24/2020   PHOS 3.1 11/21/2019     WBC      Component Value Date/Time   WBC 7.9 01/24/2020 1541   ANC    Component Value Date/Time   NEUTROABS 6.7 01/21/2020 2204   ALC No components found for: LYMPHAB    Plt: Lab Results   Component Value Date   PLT 331 01/24/2020     Lactic Acid, Venous    Component Value Date/Time   LATICACIDVEN 1.9 11/20/2019 0400    HG/HCT  Stable     Component Value Date/Time   HGB 15.5 (H) 01/24/2020 1541   HGB 15.3 (H) 11/18/2019 0946   HCT 46.3 (H) 01/24/2020 1541    Recent Labs  Lab 01/24/20 1541  LIPASE 23   No results for input(s): AMMONIA in the  last 168 hours.  No components found for: LABALBU  Troponin 4      ECG: Ordered Personally reviewed by me showing: HR : 116 Rhythm:   Sinus tachycardia  no evidence of ischemic changes QTC  524   BNP (last 3 results) Recent Labs    08/12/19 1330 08/13/19 0421 08/14/19 0337  BNP 283.1* 536.7* 394.6*    CBG (last 3)  Recent Labs    01/21/20 2151  GLUCAP 92      UA  no evidence of UTI   Urine analysis:    Component Value Date/Time   COLORURINE YELLOW 01/24/2020 1541   APPEARANCEUR HAZY (A) 01/24/2020 1541   LABSPEC 1.012 01/24/2020 1541   PHURINE 5.0 01/24/2020 1541   GLUCOSEU NEGATIVE 01/24/2020 1541   HGBUR SMALL (A) 01/24/2020 1541   BILIRUBINUR NEGATIVE 01/24/2020 1541   KETONESUR NEGATIVE 01/24/2020 1541   PROTEINUR NEGATIVE 01/24/2020 1541   UROBILINOGEN 0.2 07/17/2014 0105   NITRITE NEGATIVE 01/24/2020 1541   LEUKOCYTESUR NEGATIVE 01/24/2020 1541    Ordered   KUB no acute     ED Triage Vitals  Enc Vitals Group     BP 01/24/20 1538 109/68     Pulse Rate 01/24/20 1538 (!) 114     Resp 01/24/20 1538 19     Temp 01/24/20 1538 98.7 F (37.1 C)     Temp Source 01/24/20 1538 Oral     SpO2 01/24/20 1534 98 %     Weight 01/24/20 1539 200 lb (90.7 kg)     Height 01/24/20 1539 5\' 4"  (1.626 m)     Head Circumference --      Peak Flow --      Pain Score 01/24/20 1539 0     Pain Loc --      Pain Edu? --      Excl. in [place]? --   TMAX(24)@       Latest  Blood pressure (!) 138/103, pulse (!) 113, temperature 98.7 F (37.1 C), temperature source Oral, resp. rate (!) 24, height 5\' 4"  (1.626  m), weight 90.7 kg, SpO2 93 %.    Review of Systems:    Pertinent positives include:   nausea, vomiting, diarrhea,   Constitutional:  No weight loss, night sweats, Fevers, chills, fatigue, weight loss  HEENT:  No headaches, Difficulty swallowing,Tooth/dental problems,Sore throat,  No sneezing, itching, ear ache, nasal congestion, post nasal drip,  Cardio-vascular:  No chest pain, Orthopnea, PND, anasarca, dizziness, palpitations.no Bilateral lower extremity swelling  GI:  No heartburn, indigestion, abdominal pain,change in bowel habits, loss of appetite, melena, blood in stool, hematemesis Resp:  no shortness of breath at rest. No dyspnea on exertion, No excess mucus, no productive cough, No non-productive cough, No coughing up of blood.No change in color of mucus.No wheezing. Skin:  no rash or lesions. No jaundice GU:  no dysuria, change in color of urine, no urgency or frequency. No straining to urinate.  No flank pain.  Musculoskeletal:  No joint pain or no joint swelling. No decreased range of motion. No back pain.  Psych:  No change in mood or affect. No depression or anxiety. No memory loss.  Neuro: no localizing neurological complaints, no tingling, no weakness, no double vision, no gait abnormality, no slurred speech, no confusion  All systems reviewed and apart from [employer] all are negative  Past Medical History:   Past Medical History:  Diagnosis Date  . Anxiety   . Blood transfusion without reported diagnosis   .  Coronary artery disease   . Hyperlipidemia   . Hypertension   . Iron deficiency anemia 01/15/2019  . 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 Psoriatic arthritis ([place])   . Stroke [facility]) 2014      Past Surgical History:  Procedure Laterality Date  . CARPAL TUNNEL RELEASE     B/L hand  . CHOLECYSTECTOMY    . FOOT SURGERY     with hardware   . HERNIA REPAIR    . KNEE  ARTHROSCOPY Bilateral 08/2014   [facility] Dr. Len Childs  . TEE WITHOUT CARDIOVERSION  01/22/2012   Procedure: TRANSESOPHAGEAL ECHOCARDIOGRAM (TEE);  Surgeon: Lelon Perla, MD;  Location: [facility] ENDOSCOPY;  Service: Cardiovascular;  Laterality: N/A;  . TOTAL HIP ARTHROPLASTY Bilateral     Social History:  Ambulatory   independently       reports that she has quit smoking. She has never used smokeless tobacco. She reports current alcohol use. She reports that she does not use drugs.   Family History:   Family History  Problem Relation Age of Onset  . Hypertension Mother   . Hypertension Father   . Colon cancer Maternal Grandfather   . Colon cancer Paternal Grandfather   . Colon polyps Neg Hx   . Esophageal cancer Neg Hx   . Rectal cancer Neg Hx   . Stomach cancer Neg Hx     Allergies: Allergies  Allergen Reactions  . Nsaids Other (See Comments)    stroke CVA   . Benzoin Dermatitis and Other (See Comments)    Localized - Skin Red with Burning Sensation Skin turns red and gets inflamed      Prior to Admission medications   Medication Sig Start Date End Date Taking? Authorizing Provider  Abatacept (ORENCIA IV) Inject 1 each into the vein every 30 (thirty) days. Psoriatic arthristis every 4 weeks    Yes [provider]  aspirin 325 MG EC tablet Take 325 mg by mouth daily.    Yes [provider]  atorvastatin (LIPITOR) 40 MG tablet Take 40 mg by mouth daily. 07/06/19  Yes [provider]  Buprenorphine HCl (BELBUCA) 450 MCG FILM Place 450 mcg inside cheek in the morning and at bedtime.  09/04/19  Yes [provider]  cephALEXin (KEFLEX) 500 MG capsule Take 500 mg by mouth 3 (three) times daily. 01/12/20  Yes [provider]  escitalopram (LEXAPRO) 20 MG tablet Take 20 mg by mouth daily.   Yes [provider]  furosemide (LASIX) 20 MG tablet Take 20 mg by mouth daily. 01/17/20  Yes [provider]  hydrochlorothiazide  (MICROZIDE) 12.5 MG capsule Take 12.5 mg by mouth daily.  12/28/19  Yes [provider]  losartan (COZAAR) 100 MG tablet Take 100 mg by mouth daily. 12/21/19  Yes [provider]  potassium chloride SA (KLOR-CON) 20 MEQ tablet Take 40 mEq by mouth daily.  12/08/19  Yes [provider]  pregabalin (LYRICA) 75 MG capsule Take 150 mg by mouth 3 (three) times daily.    Yes [provider]  traZODone (DESYREL) 50 MG tablet TAKE 1 TABLET BY MOUTH ONE HOUR BEFORE BEDTIME 09/09/19 09/22/19  [provider]   Physical Exam: Blood pressure (!) 138/103, pulse (!) 113, temperature 98.7 F (37.1 C), temperature source Oral, resp. rate (!) 24, height 5\' 4"  (1.626 m), weight 90.7 kg, SpO2 93 %. 1. General:  in No  Acute  distress    Chronically ill -appearing 2. Psychological: Alert and   Oriented 3. Head/ENT:  Dry Mucous Membranes                          Head Non traumatic, neck supple                            Poor Dentition 4. SKIN:  decreased Skin turgor,  Skin clean Dry and intact no rash 5. Heart: Regular rate and rhythm no  Murmur, no Rub or gallop 6. Lungs: Clear to auscultation bilaterally, no wheezes or crackles   7. Abdomen: Soft, non-tender, Non distended  Obese bowel sounds present 8. Lower extremities: no clubbing, cyanosis, no edema 9. Neurologically Grossly intact, moving all 4 extremities equally  10. MSK: Normal range of motion   All other LABS:     Recent Labs  Lab 01/21/20 2204 01/21/20 2213 01/24/20 1541  WBC 9.7  --  7.9  NEUTROABS 6.7  --   --   HGB 14.7 15.0 15.5*  HCT 43.8 44.0 46.3*  MCV 95.8  --  96.9  PLT 375  --  331     Recent Labs  Lab 01/21/20 2204 01/21/20 2213 01/24/20 1541  NA 137 135 134*  K 3.6 3.6 3.3*  CL 95* 94* 92*  CO2 27  --  22  GLUCOSE 90 89 107*  BUN 14 14 20   CREATININE 0.80 1.10* 0.94  CALCIUM 9.0  --  8.2*     Recent Labs  Lab 01/21/20 2204 01/24/20 1541  AST 31 68*  ALT 29 51*    ALKPHOS 83 86  BILITOT 1.0 0.9  PROT 6.9 7.3  ALBUMIN 4.2 4.4       Cultures:    Component Value Date/Time   SDES BLOOD LEFT ANTECUBITAL 11/19/2019 1955   SPECREQUEST  11/19/2019 1955    BOTTLES DRAWN AEROBIC AND ANAEROBIC Blood Culture results may not be optimal due to an inadequate volume of blood received in culture bottles Performed at [facility], [place] [address], [place], [place] [address]    CULT NO GROWTH 5 DAYS 11/19/2019 1955   REPTSTATUS 11/24/2019 FINAL 11/19/2019 1955     Radiological Exams on Admission: DG Abdomen 1 View  Result Date: 01/24/2020 CLINICAL DATA:  Vomiting EXAM: ABDOMEN - 1 VIEW COMPARISON:  CT abdomen pelvis dated November 19, 2019 peer FINDINGS: Patient is status post bilateral total hip arthroplasty. There is a long stem prosthesis on the left. There is some lucency about the left hip prosthesis. The patient is status post bilateral tubal ligation. The bowel gas pattern is nonobstructive. The patient is likely status post cholecystectomy. There are no definite radiopaque kidney stones. IMPRESSION: 1. No definite radiopaque kidney stones identified. 2. Nonspecific bowel gas pattern. 3. Left total hip arthroplasty hardware in place. There is some lucency about the left hip prosthesis which can be seen with loosening. Electronically Signed   By: Constance Holster M.D.   On: 01/24/2020 21:08    Chart has been reviewed   Assessment/Plan   49 y.o. female with medical history significant of anxiety, coronary artery disease, HLD, HTN, iron deficiency anemia, psoriatic arthritis, stroke     Admitted for persistent nausea and vomiting  Present on Admission: . Intractable nausea and vomiting -unclear etiology patient have had similar presentation in the past which felt to be secondary to gastroenteritis will  rehydrate and follow supportive management for now if needed may consider further abdominal imaging.  She have had recent MRI of her brain  which was unremarkable and excludes CNS pathology as a cause for nausea and vomiting besides patient also complaining of some diarrhea as well. Obtain gastric panel and check for C. difficile patient has been on antibiotics in the past  . Hypertension -allow permissive hypertension for tonight and follow restart when able  . Iron deficiency anemia -chronic stable continue to follow   . Elevated liver enzymes chronic currently improved from baseline  . Hypokalemia  -we will replace and check magnesium level    . Dehydration we will rehydrate   . Prolonged QT interval hold home medications  . Chronic pain on Belbuca will attempt to restart.  Patient followed by pain clinic. Adderall is not listed on her medications.  When U tox checked in the clinic it was positive for opioids for past 2 times it was checked here was negative.  Instead it is positive for amphetamines.  Patient did admit taking her Adderall 20 mg yesterday but not for a long time prior to that. It is possible that current symptoms are secondary to withdrawal versus initiation of medications/substances  Patient was noted to have persistently positive for amphetamines she stated that she takes Adderall 20 mg she no longer has prescription for this but this was given to her years ago when she takes it on occasion.  She reports last dose was yesterday and does not remember when that she took it before but has been a while of note urine drug screen from 5/21 is positive for amphetamines as well.  Other plan as per orders.  DVT prophylaxis:  SCD    Code Status:  FULL CODE as per patient   I had personally discussed CODE STATUS with patient    Family Communication:   Family not at  Bedside    Disposition Plan:       To home once workup is complete and patient is stable   Following barriers for discharge:                            Electrolytes corrected                           vomiting  controlled with PO medications                                                         Will need to be able to tolerate PO                           EXPECT DC tomorrow  Consults called: none    Admission status:  ED Disposition    ED Disposition Condition [place]: [place] [[id number]]  Level of Care: Telemetry [5]  Admit to tele based on following criteria: Monitor QTC interval  Covid Evaluation: Confirmed COVID Negative  Diagnosis: Intractable nausea and vomiting [id number]  Admitting Physician: Toy Baker [[id number]]  Attending Physician: Toy Baker [[id number]]      Obs      Level of care    tele  For   24H     Precautions: admitted as Covid Negative     PPE: Used by the provider:   P100  eye Goggles,  Gloves   Nyshawn Gowdy 01/24/2020, 10:46 PM    [facility]     after 2 AM please page floor coverage PA If 7AM-7PM, please contact the day team taking care of the patient using [email or web address]   Patient was evaluated in the context of the global COVID-19 pandemic, which necessitated consideration that the patient might be at risk for infection with the SARS-CoV-2 virus that causes COVID-19. Institutional protocols and algorithms that pertain to the evaluation of patients at risk for COVID-19 are in a state of rapid change based on information released by regulatory bodies including the CDC and federal and state organizations. These policies and algorithms were followed during the patient's care.

## 2020-01-24 NOTE — ED Notes (Signed)
Pt continuously vomiting despite anti-nausea medicine

## 2020-01-24 NOTE — ED Triage Notes (Addendum)
Pt to ED by [facility] with c/o of Nausea. [facility] and discharged. Pt states nausea has been going on since she was discharged. Pt is also complaining of weakness this morning, but able to ambulate per [facility] to stretcher. Pt is A&O and slight tachy.

## 2020-01-24 NOTE — ED Provider Notes (Signed)
[facility] DEPT Provider Note   CSN: [id number] Arrival date & time: 01/24/20  1530     History Chief Complaint  Patient presents with  . Nausea    [name] [name] is a 49 y.o. female.  49 y.o female with a PMH of Anxiety, HTN, Hyperlipidemia presents to the ED via EMS with complaints of nausea.  Patient reports symptoms began while she was hospitalized 2 days ago for polypharmacy.  Patient endorses "my eyes feel funny ", there was a similar complaint during her last ED visit.  Also reports several episodes of nonbilious, nonbloody emesis, also reports multiple episodes of diarrhea without any blood in her stool.  She has tried drinking ginger ale without improvement in her symptoms.  Does have a prior history of a cholecystectomy.  She denies any fever, abdominal pain, chest pain, shortness of breath.  Of note, patient reports symptoms began while she was mowing the lawn couple days ago, however prior to this she stated she was in the hospital?.  According to her record patient was discharged home on Friday, 2 days ago.   The history is provided by the patient and medical records.       Past Medical History:  Diagnosis Date  . Anxiety   . Blood transfusion without reported diagnosis   . Coronary artery disease   . Hyperlipidemia   . Hypertension   . Iron deficiency anemia 01/15/2019  . 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 Psoriatic arthritis ([place])   . Stroke [facility]) 2014    Patient Active Problem List   Diagnosis Date Noted  . Intractable nausea and vomiting 01/24/2020  . Prolonged QT interval 01/24/2020  . Chronic pain 01/24/2020  . Nausea and vomiting 11/20/2019  . Syncope 11/20/2019  . Dehydration 11/20/2019  . Transaminitis 11/20/2019  . Lactic acidosis 11/20/2019  . Acute hyponatremia 11/20/2019  . AKI (acute kidney injury) ([place]) 11/19/2019  . Bone spur  of right foot 09/01/2019  . Pain from implanted hardware 09/01/2019  . Acquired pes planovalgus of right foot 09/01/2019  . Person under investigation for COVID-19   . Acute respiratory failure with hypoxia ([place]) 08/12/2019  . COVID-19 virus infection: Presumed 08/12/2019  . Community acquired pneumonia 08/11/2019  . Multifocal pneumonia 08/10/2019  . Iron deficiency anemia 01/15/2019  . B12 deficiency 01/01/2019  . Immunosuppressed status ([place]) 12/31/2018  . Myoclonic jerking 12/29/2018  . Generalized anxiety disorder 08/04/2018  . Nontraumatic complete tear of right rotator cuff 07/08/2018  . Obesity with serious comorbidity 01/17/2018  . Normochromic normocytic anemia 12/24/2017  . 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 Right leg swelling 02/21/2017  . Asthmatic bronchitis with acute exacerbation 02/15/2017  . Noncompliance with treatment plan 12/27/2016  . Synovitis of right foot 12/04/2016  . Ganglion of foot, right 11/09/2016  . Hypokalemia 10/12/2016  . Hyperglycemia 10/11/2016  . IFG (impaired fasting glucose) 10/11/2016  . Lumbar trigger point syndrome 03/13/2016  . Pap smear for cervical cancer screening 02/17/2016  . Elevated liver enzymes 01/27/2016  . Long term (current) use of antibiotics 01/27/2016  . Cellulitis of left lower limb 01/03/2016  . Wound dehiscence 01/03/2016  . Loose total hip arthroplasty ([place]) 12/20/2015  . Foot pain, right 09/09/2015  . Screening for breast cancer 02/16/2015  . Left axillary hidradenitis 02/16/2015  . Epidermal inclusion cyst 01/11/2015  .  Urticaria 06/22/2014  . Abnormal finding of blood chemistry 02/23/2014  . Benign essential hypertension 02/23/2014  . Brain neoplasm ([place]) 02/23/2014  . 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 Edema 02/23/2014  . Elevated blood-pressure reading without diagnosis of hypertension  02/23/2014  . Elevated WBC count 02/23/2014  . Hearing loss of both ears 02/23/2014  . High risk medication use 02/23/2014  . Hip joint painful on movement 02/23/2014  . Mixed hyperlipidemia 02/23/2014  . Dislocation of right patella 02/23/2014  . Irritable bowel syndrome 02/23/2014  . Primary osteoarthritis involving multiple joints 02/23/2014  . Sudden idiopathic hearing loss 02/23/2014  . Primary osteoarthritis of left knee 02/23/2014  . Primary osteoarthritis of right knee 02/23/2014  . Osteoarthritis 02/23/2014  . Internal derangement of left knee 02/23/2014  . Pain in joint, pelvic region and thigh 02/23/2014  . Pain in joint, ankle and foot 02/23/2014  . Coccydynia 02/23/2014  . Right-sided low back pain with sciatica 02/19/2014  . 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 Fall at home 10/04/2012  . Acute ischemic stroke ([place]) 01/19/2012  . Hypertension 01/19/2012  . Anxiety 01/19/2012  . Chronic knee pain 01/19/2012  . Restless legs syndrome 09/05/2010  . HYPERSOMNIA 06/04/2010    Past Surgical History:  Procedure Laterality Date  . CARPAL TUNNEL RELEASE     B/L hand  . CHOLECYSTECTOMY    . FOOT SURGERY     with hardware   . HERNIA REPAIR    . KNEE ARTHROSCOPY Bilateral 08/2014   [facility] Dr. Len Childs  . TEE WITHOUT CARDIOVERSION  01/22/2012   Procedure: TRANSESOPHAGEAL ECHOCARDIOGRAM (TEE);  Surgeon: Lelon Perla, MD;  Location[facility] ENDOSCOPY;  Service: Cardiovascular;  Laterality: N/A;  . TOTAL HIP ARTHROPLASTY Bilateral      OB History   No obstetric history on file.     Family History  Problem Relation Age of Onset  . Hypertension Mother   . Hypertension Father   . Colon cancer Maternal Grandfather   . Colon cancer Paternal Grandfather   . Colon polyps Neg Hx   . Esophageal cancer Neg Hx   . Rectal cancer Neg Hx   . Stomach cancer Neg Hx     Social History   Tobacco Use  . Smoking status: Former Research scientist (life sciences)  . Smokeless tobacco: Never  Used  . Tobacco comment: quit 10 years ago, smoked intermittently for few years. Less than 10 yrs total.  Substance Use Topics  . Alcohol use: Yes    Comment: occ- 1-2 drinks a week  . Drug use: No    Home Medications Prior to Admission medications   Medication Sig Start Date End Date Taking? Authorizing Provider  Abatacept (ORENCIA IV) Inject 1 each into the vein every 30 (thirty) days. Psoriatic arthristis every 4 weeks    Yes [provider]  aspirin 325 MG EC tablet Take 325 mg by mouth daily.    Yes [provider]  atorvastatin (LIPITOR) 40 MG tablet Take 40 mg by mouth daily. 07/06/19  Yes [provider]  Buprenorphine HCl (BELBUCA) 450 MCG FILM Place 450 mcg inside cheek in the morning and at bedtime.  09/04/19  Yes [provider]  cephALEXin (KEFLEX) 500 MG capsule Take 500 mg by mouth 3 (three) times daily. 01/12/20  Yes [provider]  escitalopram (LEXAPRO) 20 MG tablet Take 20 mg by mouth daily.   Yes [provider]  furosemide (LASIX) 20 MG tablet Take 20  mg by mouth daily. 01/17/20  Yes [provider]  hydrochlorothiazide (MICROZIDE) 12.5 MG capsule Take 12.5 mg by mouth daily.  12/28/19  Yes [provider]  losartan (COZAAR) 100 MG tablet Take 100 mg by mouth daily. 12/21/19  Yes [provider]  potassium chloride SA (KLOR-CON) 20 MEQ tablet Take 40 mEq by mouth daily.  12/08/19  Yes [provider]  pregabalin (LYRICA) 75 MG capsule Take 150 mg by mouth 3 (three) times daily.    Yes [provider]  ondansetron (ZOFRAN) 4 MG tablet Take 1 tablet (4 mg total) by mouth daily as needed for up to 20 doses for nausea or vomiting. 01/26/20   Antonieta Pert, MD  traZODone (DESYREL) 50 MG tablet TAKE 1 TABLET BY MOUTH ONE HOUR BEFORE BEDTIME 09/09/19 09/22/19  [provider]    Allergies    Nsaids and Benzoin  Review of Systems   Review of Systems  Constitutional: Negative for  chills and fever.  HENT: Negative for sore throat.   Respiratory: Negative for shortness of breath.   Cardiovascular: Negative for chest pain.  Gastrointestinal: Positive for diarrhea, nausea and vomiting. Negative for abdominal pain, blood in stool, constipation and rectal pain.  Genitourinary: Negative for flank pain.  Musculoskeletal: Negative for back pain.  Skin: Negative for pallor and wound.  Neurological: Negative for headaches.  All other systems reviewed and are negative.   Physical Exam Updated Vital Signs BP (!) 155/93 (BP Location: Left Arm)   Pulse 85   Temp 98 F (36.7 C) (Oral)   Resp 18   Ht 5\' 4"  (1.626 m)   Wt 90.7 kg   SpO2 100%   BMI 34.33 kg/m   Physical Exam Vitals and nursing note reviewed.  Constitutional:      Appearance: She is ill-appearing.  HENT:     Head: Normocephalic and atraumatic.     Nose: Nose normal.     Mouth/Throat:     Mouth: Mucous membranes are dry.  Eyes:     Pupils: Pupils are equal, round, and reactive to light.  Cardiovascular:     Rate and Rhythm: Tachycardia present.  Pulmonary:     Effort: Pulmonary effort is normal.     Breath sounds: No wheezing or rales.  Abdominal:     General: Abdomen is flat.     Tenderness: There is no abdominal tenderness. There is no right CVA tenderness, left CVA tenderness, guarding or rebound.  Skin:    General: Skin is warm and dry.  Neurological:     Mental Status: She is alert and oriented to person, place, and time.     ED Results / Procedures / Treatments   Labs (all labs ordered are listed, but only abnormal results are displayed) Labs Reviewed  COMPREHENSIVE METABOLIC PANEL - Abnormal; Notable for the following components:      Result Value   Sodium 134 (*)    Potassium 3.3 (*)    Chloride 92 (*)    Glucose, Bld 107 (*)    Calcium 8.2 (*)    AST 68 (*)    ALT 51 (*)    Anion gap 20 (*)    All other components within normal limits  CBC - Abnormal; Notable for the  following components:   Hemoglobin 15.5 (*)    HCT 46.3 (*)    All other components within normal limits  URINALYSIS, ROUTINE W REFLEX MICROSCOPIC - Abnormal; Notable for the following components:   APPearance  HAZY (*)    Hgb urine dipstick SMALL (*)    All other components within normal limits  RAPID URINE DRUG SCREEN, HOSP PERFORMED - Abnormal; Notable for the following components:   Amphetamines POSITIVE (*)    All other components within normal limits  COMPREHENSIVE METABOLIC PANEL - Abnormal; Notable for the following components:   Potassium 3.2 (*)    Glucose, Bld 103 (*)    Calcium 7.4 (*)    Total Protein 6.1 (*)    AST 48 (*)    Total Bilirubin 1.4 (*)    All other components within normal limits  COMPREHENSIVE METABOLIC PANEL - Abnormal; Notable for the following components:   Potassium 3.4 (*)    Creatinine, Ser 0.43 (*)    Calcium 7.6 (*)    Total Protein 5.3 (*)    Albumin 3.2 (*)    AST 44 (*)    All other components within normal limits  CBC - Abnormal; Notable for the following components:   RBC 3.66 (*)    Hemoglobin 11.7 (*)    MCV 100.3 (*)    All other components within normal limits  SARS CORONAVIRUS 2 BY RT PCR (HOSPITAL ORDER, [place] [facility])  GASTROINTESTINAL PANEL BY PCR, STOOL (REPLACES STOOL CULTURE)  C DIFFICILE QUICK SCREEN W PCR REFLEX  LIPASE, BLOOD  MAGNESIUM  PHOSPHORUS  CBC WITH DIFFERENTIAL/PLATELET  TSH  HEMOGLOBIN A1C  POTASSIUM  I-STAT BETA HCG BLOOD, ED ([facility], [facility], [facility] ONLY)  TROPONIN I (HIGH SENSITIVITY)  TROPONIN I (HIGH SENSITIVITY)    EKG EKG Interpretation  Date/Time:  Sunday Jan 24 2020 17:54:36 EDT Ventricular Rate:  116 PR Interval:    QRS Duration: 88 QT Interval:  377 QTC Calculation: 524 R Axis:   31 Text Interpretation: Sinus tachycardia Borderline T abnormalities, inferior leads Prolonged QT interval QT prolonged new since previous Confirmed by Wandra Arthurs [id number]) on 01/24/2020 5:56:27  PM   Radiology No results found.  Procedures Procedures (including critical care time)  Medications Ordered in ED Medications  sodium chloride flush (NS) 0.9 % injection 3 mL (3 mLs Intravenous Given 01/24/20 1606)  ondansetron (ZOFRAN-ODT) disintegrating tablet 4 mg (4 mg Oral Given 01/24/20 1558)  sodium chloride 0.9 % bolus 1,000 mL (0 mLs Intravenous Stopped 01/24/20 1828)  promethazine (PHENERGAN) injection 25 mg (25 mg Intravenous Given 01/24/20 1635)  sodium chloride 0.9 % bolus 1,000 mL (0 mLs Intravenous Stopped 01/24/20 2027)  LORazepam (ATIVAN) injection 1 mg (1 mg Intravenous Given 01/24/20 1833)  potassium chloride 10 mEq in 100 mL IVPB (0 mEq Intravenous Stopped 01/24/20 1950)  potassium chloride 10 mEq in 100 mL IVPB (10 mEq Intravenous New Bag/Given 01/24/20 2244)  potassium chloride 10 mEq in 100 mL IVPB (10 mEq Intravenous New Bag/Given 01/25/20 0955)  potassium chloride SA (KLOR-CON) CR tablet 20 mEq (20 mEq Oral Given 01/26/20 1001)    ED Course  I have reviewed the triage vital signs and the nursing notes.  Pertinent labs & imaging results that were available during my care of the patient were reviewed by me and considered in my medical decision making (see chart for details).  Clinical Course as of Jan 25 2213  Nancy Fetter Jan 24, 2020  1748 Amphetamines(!): POSITIVE [JS]  [id number] After 1 L of fluids  Pulse Rate(!): 121 [JS]    Clinical Course User Index [JS] Janeece Fitting, PA-C   MDM Rules/Calculators/A&P   She is seen in the ED approximately 3 days ago  for the same complaint with "funny looking eyes ", has had multiple episodes of nausea and vomiting since being discharged from the ED.  Reports she has been unable to keep anything down.  Returns today with the same symptoms which she reports did not completely resolved upon discharge.  During evaluation patient is ill-appearing, tachycardic in the 100s upon arrival.  Denies any belly pain, lungs are clear to auscultation,  abdomen is soft, no signs of fluid overload.  She is tachypneic at 27, prior's were reviewed.  Today she is remarkable for hypokalemia, suspect this is likely from recurrent vomiting episodes, will be replaced while in the ED.  Her creatinine is within normal limits, LFTs are slightly elevated but improved from previous visit which were than the 100s.  Her anion gap was 20, acidosis from her dehydration and vomiting. CBC without any leukocytosis.  Hemoglobin is slightly elevated asked she is hemoconcentrated.  COVID-19 is negative.  Troponin has also been negative.  Her beta hCG is negative.  Patient has received 2 L of fluids without improvement in her heart rate, continues to vomit.  She was given Zofran, Phenergan, Ativan as her QTC is slightly prolongated. Not much improvement in her symptoms she continues to be tachycardic along with continues to have episodes of emesis per nursing staff.  She is on amphetamines, does report she is taking Adderall?  I do not see this prescription of the PDMP after review.  UDS is positive for these methamphetamines.  After several attempts add controlling patient's symptoms this has failed while in the ED, will consult hospitalist service for further admission.  I have spoken to Dr. Roel Cluck who is agreeable of admitting patient for further management of her uncontrollable nausea and vomiting.  Portions of this note were generated with [employer]. Dictation errors may occur despite best attempts at proofreading.  Final Clinical Impression(s) / ED Diagnoses Final diagnoses:  Non-intractable vomiting with nausea, unspecified vomiting type    Rx / DC Orders ED Discharge Orders         Ordered    Increase activity slowly     01/26/20 1206    Diet - low sodium heart healthy     01/26/20 1206    Discharge instructions    Comments: Please follow-up with GI as needed  Follow-up with PCP in 1 week.   01/26/20 1206    ondansetron (ZOFRAN) 4 MG  tablet  Daily PRN     01/26/20 1206           Janeece Fitting, PA-C 01/26/20 2215    Drenda Freeze, MD 01/27/20 [phone]

## 2020-01-25 DIAGNOSIS — R945 Abnormal results of liver function studies: Secondary | ICD-10-CM | POA: Diagnosis not present

## 2020-01-25 DIAGNOSIS — E876 Hypokalemia: Secondary | ICD-10-CM | POA: Diagnosis not present

## 2020-01-25 DIAGNOSIS — R112 Nausea with vomiting, unspecified: Secondary | ICD-10-CM | POA: Diagnosis not present

## 2020-01-25 DIAGNOSIS — I10 Essential (primary) hypertension: Secondary | ICD-10-CM | POA: Diagnosis not present

## 2020-01-25 DIAGNOSIS — R197 Diarrhea, unspecified: Secondary | ICD-10-CM | POA: Diagnosis not present

## 2020-01-25 LAB — GASTROINTESTINAL PANEL BY PCR, STOOL (REPLACES STOOL CULTURE)

## 2020-01-25 LAB — HEMOGLOBIN A1C
Hgb A1c MFr Bld: 5.5 % (ref 4.8–5.6)
Mean Plasma Glucose: 111.15 mg/dL

## 2020-01-25 LAB — CBC WITH DIFFERENTIAL/PLATELET
Abs Immature Granulocytes: 0.02 10*3/uL (ref 0.00–0.07)
Basophils Absolute: 0.1 10*3/uL (ref 0.0–0.1)
Basophils Relative: 1 %
Eosinophils Absolute: 0.1 10*3/uL (ref 0.0–0.5)
Eosinophils Relative: 1 %
HCT: 39.3 % (ref 36.0–46.0)
Hemoglobin: 13.1 g/dL (ref 12.0–15.0)
Immature Granulocytes: 0 %
Lymphocytes Relative: 16 %
Lymphs Abs: 1.3 10*3/uL (ref 0.7–4.0)
MCH: 32.4 pg (ref 26.0–34.0)
MCHC: 33.3 g/dL (ref 30.0–36.0)
MCV: 97.3 fL (ref 80.0–100.0)
Monocytes Absolute: 0.9 10*3/uL (ref 0.1–1.0)
Monocytes Relative: 12 %
Neutro Abs: 5.5 10*3/uL (ref 1.7–7.7)
Neutrophils Relative %: 70 %
Platelets: 256 10*3/uL (ref 150–400)
RBC: 4.04 MIL/uL (ref 3.87–5.11)
RDW: 14 % (ref 11.5–15.5)
WBC: 7.9 10*3/uL (ref 4.0–10.5)
nRBC: 0 % (ref 0.0–0.2)

## 2020-01-25 LAB — COMPREHENSIVE METABOLIC PANEL
ALT: 42 U/L (ref 0–44)
AST: 48 U/L — ABNORMAL HIGH (ref 15–41)
Albumin: 3.7 g/dL (ref 3.5–5.0)
Alkaline Phosphatase: 75 U/L (ref 38–126)
Anion gap: 10 (ref 5–15)
BUN: 16 mg/dL (ref 6–20)
CO2: 27 mmol/L (ref 22–32)
Calcium: 7.4 mg/dL — ABNORMAL LOW (ref 8.9–10.3)
Chloride: 100 mmol/L (ref 98–111)
Creatinine, Ser: 0.59 mg/dL (ref 0.44–1.00)
GFR calc Af Amer: >60 mL/min (ref >60)
GFR calc non Af Amer: >60 mL/min (ref >60)
Glucose, Bld: 103 mg/dL — ABNORMAL HIGH (ref 70–99)
Potassium: 3.2 mmol/L — ABNORMAL LOW (ref 3.5–5.1)
Sodium: 137 mmol/L (ref 135–145)
Total Bilirubin: 1.4 mg/dL — ABNORMAL HIGH (ref 0.3–1.2)
Total Protein: 6.1 g/dL — ABNORMAL LOW (ref 6.5–8.1)

## 2020-01-25 LAB — POTASSIUM: Potassium: 3.5 mmol/L (ref 3.5–5.1)

## 2020-01-25 LAB — MAGNESIUM: Magnesium: 2 mg/dL (ref 1.7–2.4)

## 2020-01-25 LAB — PHOSPHORUS: Phosphorus: 2.6 mg/dL (ref 2.5–4.6)

## 2020-01-25 LAB — TSH: TSH: 1.597 u[IU]/mL (ref 0.350–4.500)

## 2020-01-25 LAB — C DIFFICILE QUICK SCREEN W PCR REFLEX
C Diff antigen: NEGATIVE
C Diff interpretation: NOT DETECTED
C Diff toxin: NEGATIVE

## 2020-01-25 MED ORDER — ASPIRIN EC 325 MG PO TBEC: 325.0000 mg | DELAYED_RELEASE_TABLET | Freq: Every day | ORAL | Status: DC

## 2020-01-25 MED ORDER — LORAZEPAM 2 MG/ML IJ SOLN
0.5000 mg | Freq: Four times a day (QID) | INTRAMUSCULAR | Status: DC | PRN
  Administered 2020-01-25 – 2020-01-26 (×4): 0.5 mg via INTRAVENOUS
  Filled 2020-01-25 (×4): qty 1

## 2020-01-25 MED ORDER — DIPHENOXYLATE-ATROPINE 2.5-0.025 MG PO TABS
1.0000 | ORAL_TABLET | Freq: Four times a day (QID) | ORAL | Status: DC | PRN
  Administered 2020-01-25 – 2020-01-26 (×4): 1 via ORAL
  Filled 2020-01-25 (×4): qty 1

## 2020-01-25 MED ORDER — ASPIRIN EC 81 MG PO TBEC
81.0000 mg | DELAYED_RELEASE_TABLET | Freq: Every day | ORAL | Status: DC
  Administered 2020-01-25 – 2020-01-26 (×2): 81 mg via ORAL
  Filled 2020-01-25 (×2): qty 1

## 2020-01-25 MED ORDER — PREGABALIN 75 MG PO CAPS
150.0000 mg | ORAL_CAPSULE | Freq: Three times a day (TID) | ORAL | Status: DC
  Administered 2020-01-25 – 2020-01-26 (×4): 150 mg via ORAL
  Filled 2020-01-25 (×4): qty 2

## 2020-01-25 MED ORDER — POTASSIUM CHLORIDE 10 MEQ/100ML IV SOLN
10.0000 meq | INTRAVENOUS | Status: AC
  Administered 2020-01-25 (×3): 10 meq via INTRAVENOUS
  Filled 2020-01-25 (×3): qty 100

## 2020-01-25 NOTE — Progress Notes (Signed)
Initial Nutrition Assessment  DOCUMENTATION CODES:   Obesity unspecified  INTERVENTION:  - diet advancement as medically feasible.   NUTRITION DIAGNOSIS:   Inadequate oral intake related to inability to eat, nausea, vomiting as evidenced by NPO status, per patient/family report.  GOAL:   Patient will meet greater than or equal to 90% of their needs  MONITOR:   Diet advancement, Labs, Weight trends  REASON FOR ASSESSMENT:   Consult Assessment of nutrition requirement/status  ASSESSMENT:   49 y.o. female with medical history of anxiety, CAD, HLD, HTN, iron deficiency anemia, psoriatic arthritis, and stroke. She presented to the ED on 5/20 due to slurred speech and was found to be on narcotics, lyrica, benzos, and amphetamines. Symptoms were thought to be d/t accidental overdose and polypharmacy. She was discharged from the ED on 5/21 and when she returned, reported that she has been having generalized weakness and nausea since that time. She was admitted in March with N/V which after CT was thought to be d/t enteritis.   Patient was admitted as Observation status and has been NPO since that time. She has not been seen by a  RD at any point in the past. Patient reports good appetite and no changes to eating habits until symptoms began on 5/20 and she presented to the ED. She has not had anything to eat, as far as she is able to recall, since 5/19. Unsure of what she ate on that date.  Patient denies any changes in weight. Per chart review, weight yesterday was recorded as 200 lb (appears to be a stated weight), weight on 5/20 was 205 lb and weight was mainly stable for 3 months.  Per notes: - intractable N/V--thought to be 2/2 gastroenteritis - chronic pain   Labs reviewed; K: 3.2 mmol/l, Ca: 7.4 mg/dl, AST elevated but trending down. Medications reviewed; 10 mEq IV KCl x3 runs 5/23 and x3 runs 5/24. IVF; NS @ 100 ml/hr.    NUTRITION - FOCUSED PHYSICAL  EXAM:  completed to upper body; no muscle or fat wasting.   Diet Order:   Diet Order            Diet NPO time specified  Diet effective now              EDUCATION NEEDS:   No education needs have been identified at this time  Skin:     Last BM:  5/24  Height:   Ht Readings from Last 1 Encounters:  01/24/20 5\' 4"  (1.626 m)    Weight:   Wt Readings from Last 1 Encounters:  01/24/20 90.7 kg    Ideal Body Weight:     BMI:  Body mass index is 34.33 kg/m.  Estimated Nutritional Needs:   Kcal:  1900-2100 kcal  Protein:  90-100 grams  Fluid:  >/= 2.2 L/day     Jarome Matin, MS, RD, LDN, CNSC Inpatient Clinical Dietitian RD pager # available in [facility]  After hours/weekend pager # available in [facility]

## 2020-01-25 NOTE — Progress Notes (Signed)
PROGRESS NOTE    [name] [name]  [id number] DOB: [birth year] DOA: 01/24/2020 PCP: Robyne Peers, MD   Chef Complaints: nausea, vomitting  Brief Narrative: 49 y.o. female with medical history significant of anxiety, coronary artery disease, HLD, HTN, iron deficiency anemia, psoriatic arthritis, stroke who presented to the ED for evaluation of persistent nausea and vomiting and diarrhea that started on Saturday.She was seen in emergency department 2 days ago for slurred speech patient was on narcotics Lyrica benzos and amphetamines noted in her urine felt secondary to accidental overdose and polypharmacy. She has been feeling generally weak has had nausea ever since she got out of the emergency department on 21 Patient has known history of chronic pain for which she takes Belbuca reports she only takes it 1 time at night She reports that she has been taking Adderall occasionally with some has been prescribed to her 7 years ago and reports was dose was yesterday does not remember when was the last dose prior to this. She was also taking Lyrica, Trazodone Upon review of records do seem to the patient has been admitted if possible enteritis on March 2021 a 3 to 4 days of nausea and vomiting similar to recent episode CT scan of the time showed possible  enteritis She was treated with Ativan for nausea given prolonged QTC and improved  Subjective:  BM- yellow watery x 8 overnight vomited too. seems anxious Has had nausea/vomitting/diarrhes since Saturday. Similar episode a month ago and was hospitalized, lasted 2-3 days No sick contacts No abdomen pain. Epigastrium Sore form throwing up.  Assessment & Plan: Intractable nausea and vomiting and diarrhea: Unclear etiology, had similar presentation in the past felt to be gastroenteritis.  Abdomen exam is benign, LFTs stable lipase is normal, CT status post remote cholecystectomy.  Complains of ongoing nausea difficulty with multiple  medication due to prolonged QTC so we will continue on Ativan.  C. difficile, GI panel pending.  Ultrasound abdomen CT abdomen in March fairly stable besides mild right hydronephrosis with distended urinary bladder.  N.p.o. start clear liquid diet and advance as tolerated if remains symptomatic we will repeat further imaging.  Add as needed Lomotil  Iron deficiency anemia: Hemoglobin is stable initial high value likely from hemoconcentration Recent Labs  Lab 01/21/20 2204 01/21/20 2213 01/24/20 1541 01/25/20 0437  HGB 14.7 15.0 15.5* 13.1  HCT 43.8 44.0 46.3* 39.3   Benign essential hypertension : Hold losartan and Lasix.  Elevated liver enzymes mildly elevated likely from nausea vomiting.  Monitor  Hypokalemia: replaced and improved with  Dehydration moderate, continue IV fluid hydration at risk of further dehydration due to symptomatic status and difficulty to tolerate diet.  Prolonged QT interval, qtc 510 this am, monitor and replace electrolytes, repeat EKG in the morning.  Avoid QT prolonging medication especially with nausea medication and psychiatric medication.  Chronic pain: Continue home meds-buprenorphine.  UDS positive for amphetamines, patient apparently took Adderall  Anxiety: Continue Ativan IV as above resume home p.o. meds once QTC stable and able to tolerate p.o.  HLD continue statin.  DVT prophylaxis:SCD Code Status: full  Family Communication: plan of care discussed with patient at bedside.  Status is: Admitted as observation Patient will continue to require hospitalization due to difficulty to take oral diet with ongoing nausea vomiting, at risk of further dehydration, will need IV fluids need to advance diet slowly as tolerated and if unable to tolerate will need to obtain further imaging studies.  Dispo: The patient is  from: Home              Anticipated d/c is to: Home              Anticipated d/c date is: 1 day              Patient currently is not  medically stable to d/c.  Diet Order            Diet NPO time specified  Diet effective now            Body mass index is 34.33 kg/m.  Consultants:see note  Procedures:see note Microbiology:see note  Medications: Scheduled Meds: . aspirin EC  81 mg Oral Daily  . atorvastatin  40 mg Oral Daily  . Buprenorphine HCl  450 mcg Buccal BID  . pregabalin  150 mg Oral TID  . sodium chloride flush  3 mL Intravenous Q12H   Continuous Infusions: . sodium chloride 100 mL/hr at 01/25/20 0536  . potassium chloride 10 mEq (01/25/20 0804)   Antimicrobials: Anti-infectives (From admission, onward)   None     Objective: Vitals: Today's Vitals   01/24/20 2224 01/24/20 2300 01/25/20 0359 01/25/20 0402  BP: 130/90  140/82 137/81  Pulse: (!) 108  99 94  Resp: 19  20   Temp: 98.5 F (36.9 C)  98.2 F (36.8 C)   TempSrc: Oral  Oral   SpO2: 97%  97%   Weight:      Height:      PainSc:  0-No pain      Intake/Output Summary (Last 24 hours) at 01/25/2020 0909 Last data filed at 01/25/2020 0616 Gross per 24 hour  Intake 3052.37 ml  Output --  Net 3052.37 ml   Filed Weights   01/24/20 1539  Weight: 90.7 kg   Weight change:    Intake/Output from previous day: 05/23 0701 - 05/24 0700 In: 3052.4 [I.V.:939.8; IV Piggyback:2112.5] Out: -  Intake/Output this shift: No intake/output data recorded.  Examination:  General exam: AAOx3, anxious,NAD,weak appearing. HEENT:Oral mucosa moist, Ear/Nose WNL grossly,dentition normal. Respiratory system: bilaterally clear,no wheezing or crackles,no use of accessory muscle, non tender. Cardiovascular system: S1 & S2 +, regular, No JVD. Gastrointestinal system: Abdomen soft, NT,ND, BS+. Nervous System:Alert, awake, moving extremities and grossly nonfocal Extremities: No edema, distal peripheral pulses palpable.  Skin: No rashes,no icterus. MSK: Normal muscle bulk,tone, power  Data Reviewed: I have personally reviewed following labs and  imaging studies CBC: Recent Labs  Lab 01/21/20 2204 01/21/20 2213 01/24/20 1541 01/25/20 0437  WBC 9.7  --  7.9 7.9  NEUTROABS 6.7  --   --  5.5  HGB 14.7 15.0 15.5* 13.1  HCT 43.8 44.0 46.3* 39.3  MCV 95.8  --  96.9 97.3  PLT 375  --  331 [id number]   Basic Metabolic Panel: Recent Labs  Lab 01/21/20 2204 01/21/20 2213 01/24/20 1541 01/25/20 0437  NA 137 135 134* 137  K 3.6 3.6 3.3* 3.2*  CL 95* 94* 92* 100  CO2 27  --  22 27  GLUCOSE 90 89 107* 103*  BUN 14 14 20 16   CREATININE 0.80 1.10* 0.94 0.59  CALCIUM 9.0  --  8.2* 7.4*  MG  --   --   --  2.0  PHOS  --   --   --  2.6   GFR: Estimated Creatinine Clearance: 93.8 mL/min (by C-G formula based on SCr of 0.59 mg/dL). Liver Function Tests: Recent Labs  Lab 01/21/20 2204 01/24/20  1541 01/25/20 0437  AST 31 68* 48*  ALT 29 51* 42  ALKPHOS 83 86 75  BILITOT 1.0 0.9 1.4*  PROT 6.9 7.3 6.1*  ALBUMIN 4.2 4.4 3.7   Recent Labs  Lab 01/24/20 1541  LIPASE 23   No results for input(s): AMMONIA in the last 168 hours. Coagulation Profile: Recent Labs  Lab 01/21/20 2204  INR 0.9   Cardiac Enzymes: No results for input(s): CKTOTAL, CKMB, CKMBINDEX, TROPONINI in the last 168 hours. BNP (last 3 results) No results for input(s): PROBNP in the last 8760 hours. HbA1C: Recent Labs    01/25/20 0437  HGBA1C 5.5   CBG: Recent Labs  Lab 01/21/20 2151  GLUCAP 92   Lipid Profile: No results for input(s): CHOL, HDL, LDLCALC, TRIG, CHOLHDL, LDLDIRECT in the last 72 hours. Thyroid Function Tests: Recent Labs    01/25/20 0437  TSH 1.597   Anemia Panel: No results for input(s): VITAMINB12, FOLATE, FERRITIN, TIBC, IRON, RETICCTPCT in the last 72 hours. Sepsis Labs: No results for input(s): PROCALCITON, LATICACIDVEN in the last 168 hours.  Recent Results (from the past 240 hour(s))  SARS Coronavirus 2 by RT PCR (hospital order, performed in [facility] hospital lab) Nasopharyngeal Nasopharyngeal Swab     Status: None    Collection Time: 01/24/20  6:24 PM   Specimen: Nasopharyngeal Swab  Result Value Ref Range Status   SARS Coronavirus 2 NEGATIVE NEGATIVE Final    Comment: (NOTE) SARS-CoV-2 target nucleic acids are NOT DETECTED. The SARS-CoV-2 RNA is generally detectable in upper and lower respiratory specimens during the acute phase of infection. The lowest concentration of SARS-CoV-2 viral copies this assay can detect is 250 copies / mL. A negative result does not preclude SARS-CoV-2 infection and should not be used as the sole basis for treatment or other patient management decisions.  A negative result may occur with improper specimen collection / handling, submission of specimen other than nasopharyngeal swab, presence of viral mutation(s) within the areas targeted by this assay, and inadequate number of viral copies (<250 copies / mL). A negative result must be combined with clinical observations, patient history, and epidemiological information. Fact Sheet for Patients:   StrictlyIdeas.no Fact Sheet for Healthcare Providers: BankingDealers.co.za This test is not yet approved or cleared  by the [place] FDA and has been authorized for detection and/or diagnosis of SARS-CoV-2 by FDA under an Emergency Use Authorization (EUA).  This EUA will remain in effect (meaning this test can be used) for the duration of the COVID-19 declaration under Section 564(b)(1) of the Act, 21 U.S.C. section 360bbb-3(b)(1), unless the authorization is terminated or revoked sooner. Performed at [facility], [place] [address] [place], [place] [address]   C Difficile Quick Screen w PCR reflex     Status: None   Collection Time: 01/25/20  8:05 AM   Specimen: Stool  Result Value Ref Range Status   C Diff antigen NEGATIVE NEGATIVE Final   C Diff toxin NEGATIVE NEGATIVE Final   C Diff interpretation No C. difficile detected.  Final    Comment:  Performed at [facility], [place] [address], [place], [place] [address]      Radiology Studies: DG Abdomen 1 View  Result Date: 01/24/2020 CLINICAL DATA:  Vomiting EXAM: ABDOMEN - 1 VIEW COMPARISON:  CT abdomen pelvis dated November 19, 2019 peer FINDINGS: Patient is status post bilateral total hip arthroplasty. There is a long stem prosthesis on the left. There is some lucency about  the left hip prosthesis. The patient is status post bilateral tubal ligation. The bowel gas pattern is nonobstructive. The patient is likely status post cholecystectomy. There are no definite radiopaque kidney stones. IMPRESSION: 1. No definite radiopaque kidney stones identified. 2. Nonspecific bowel gas pattern. 3. Left total hip arthroplasty hardware in place. There is some lucency about the left hip prosthesis which can be seen with loosening. Electronically Signed   By: Constance Holster M.D.   On: 01/24/2020 21:08     LOS: 0 days   Antonieta Pert, MD [facility]  01/25/2020, 9:09 AM

## 2020-01-26 DIAGNOSIS — R945 Abnormal results of liver function studies: Secondary | ICD-10-CM | POA: Diagnosis not present

## 2020-01-26 DIAGNOSIS — R112 Nausea with vomiting, unspecified: Secondary | ICD-10-CM | POA: Diagnosis not present

## 2020-01-26 DIAGNOSIS — E876 Hypokalemia: Secondary | ICD-10-CM | POA: Diagnosis not present

## 2020-01-26 DIAGNOSIS — R197 Diarrhea, unspecified: Secondary | ICD-10-CM | POA: Diagnosis not present

## 2020-01-26 LAB — CBC
HCT: 36.7 % (ref 36.0–46.0)
Hemoglobin: 11.7 g/dL — ABNORMAL LOW (ref 12.0–15.0)
MCH: 32 pg (ref 26.0–34.0)
MCHC: 31.9 g/dL (ref 30.0–36.0)
MCV: 100.3 fL — ABNORMAL HIGH (ref 80.0–100.0)
Platelets: 210 10*3/uL (ref 150–400)
RBC: 3.66 MIL/uL — ABNORMAL LOW (ref 3.87–5.11)
RDW: 14 % (ref 11.5–15.5)
WBC: 5.7 10*3/uL (ref 4.0–10.5)
nRBC: 0 % (ref 0.0–0.2)

## 2020-01-26 LAB — COMPREHENSIVE METABOLIC PANEL
ALT: 35 U/L (ref 0–44)
AST: 44 U/L — ABNORMAL HIGH (ref 15–41)
Albumin: 3.2 g/dL — ABNORMAL LOW (ref 3.5–5.0)
Alkaline Phosphatase: 64 U/L (ref 38–126)
Anion gap: 7 (ref 5–15)
BUN: 8 mg/dL (ref 6–20)
CO2: 26 mmol/L (ref 22–32)
Calcium: 7.6 mg/dL — ABNORMAL LOW (ref 8.9–10.3)
Chloride: 105 mmol/L (ref 98–111)
Creatinine, Ser: 0.43 mg/dL — ABNORMAL LOW (ref 0.44–1.00)
GFR calc Af Amer: >60 mL/min (ref >60)
GFR calc non Af Amer: >60 mL/min (ref >60)
Glucose, Bld: 90 mg/dL (ref 70–99)
Potassium: 3.4 mmol/L — ABNORMAL LOW (ref 3.5–5.1)
Sodium: 138 mmol/L (ref 135–145)
Total Bilirubin: 1 mg/dL (ref 0.3–1.2)
Total Protein: 5.3 g/dL — ABNORMAL LOW (ref 6.5–8.1)

## 2020-01-26 MED ORDER — POTASSIUM CHLORIDE CRYS ER 20 MEQ PO TBCR
20.0000 meq | EXTENDED_RELEASE_TABLET | Freq: Once | ORAL | Status: AC
  Administered 2020-01-26: 20 meq via ORAL
  Filled 2020-01-26: qty 1

## 2020-01-26 MED ORDER — ONDANSETRON HCL 4 MG PO TABS: 4.0000 mg | ORAL_TABLET | Freq: Every day | ORAL | 0 refills | Status: DC | PRN

## 2020-01-26 NOTE — Discharge Summary (Signed)
Physician Discharge Summary  [name] [name] [id number] DOB: 08-13-1971 DOA: 01/24/2020  PCP: Robyne Peers, MD  Admit date: 01/24/2020 Discharge date: 01/26/2020  Admitted From: home Disposition:  Home  Recommendations for Outpatient Follow-up:  1. Follow up with PCP in 1-2 weeks, GI no provided for follow-up  2. Please obtain BMP/CBC in one week 3. Please follow up on the following pending results:  Home Health:no  Equipment/Devices: none  Discharge Condition: Stable Code Status: full Diet recommendation:  Diet Order            DIET SOFT Room service appropriate? Yes; Fluid consistency: Thin  Diet effective now        Diet - low sodium heart healthy               Brief/Interim Summary: 49 y.o.femalewith medical history significant of anxiety, coronary artery disease, HLD, HTN, iron deficiency anemia, psoriatic arthritis, stroke who presented to the ED for evaluation of persistent nausea and vomiting and diarrhea that started on Saturday.She was seen in emergency department 2 days ago for slurred speech patient was on narcotics Lyrica benzos and amphetamines noted in her urine felt secondary to accidental overdose and polypharmacy. She has been feeling generally weak has had nausea ever since she got out of the emergency department on 21 Patient has known history of chronic pain for which she takes Belbuca reports she only takes it 1 time at night She reports that she has been taking Adderall occasionally with some has been prescribed to her 7 years ago and reports was dose was yesterday does not remember when was the last dose prior to this. She was also taking Lyrica, Trazodone Upon review of records do seem to the patient has been admitted if possible enteritis on March 2021 a 3 to 4 days of nausea and vomiting similar to recent episode CT scan of the time showed possible enteritis Patient was managed conservatively.  QTC has improved in 462, overall tolerated  diet this morning and ready for home. To follow-up with GI and, PCP  Discharge Diagnoses:   Intractable nausea and vomiting and diarrhea unclear etiology.  Work-up with LFTs C. Difficile, GI panel was normal.  Managed conservatively and hydrated.  Symptoms have resolved tolerated soft diet and is being discharged to home with instruction to follow-up with PCP and GI and as needed Zofran provided in case he needs.  Other issues are chronic and stable continue home meds hypokalemia -potassium was replaced she will continue potassium supplementation at home Mildly abnormal LFTs monitor with PCP. Hypertension continue on Home meds Iron deficiency anemia-hemoglobin is still Dehydration-improved, continue oral hydration Prolonged QT interval-significantly better in 462 on repeat ekg from 510. Chronic pain-continue home meds buprenorphine UDS positive for amphetamine Anxiety daily resume home meds. HLD continue home meds  Consults:  none  Subjective: Doing well this morning no nausea vomiting less anxious.  Tolerated diet.  Discharge Exam: Vitals:   01/25/20 2045 01/26/20 0517  BP: 131/87 (!) 155/93  Pulse: 85 85  Resp: 20 18  Temp: 98.9 F (37.2 C) 98 F (36.7 C)  SpO2: 97% 100%   General: Pt is alert, awake, not in acute distress Cardiovascular: RRR, S1/S2 +, no rubs, no gallops Respiratory: CTA bilaterally, no wheezing, no rhonchi Abdominal: Soft, NT, ND, bowel sounds + Extremities: no edema, no cyanosis  Discharge Instructions  Discharge Instructions    Diet - low sodium heart healthy   Complete by: As directed    Discharge instructions  Complete by: As directed    Please follow-up with GI as needed  Follow-up with PCP in 1 week.   Increase activity slowly   Complete by: As directed      Allergies as of 01/26/2020      Reactions   Nsaids Other (See Comments)   stroke CVA    Benzoin Dermatitis, Other (See Comments)   Localized - Skin Red with Burning  Sensation Skin turns red and gets inflamed      Medication List    TAKE these medications   aspirin 325 MG EC tablet Take 325 mg by mouth daily.   atorvastatin 40 MG tablet Commonly known as: LIPITOR Take 40 mg by mouth daily.   Belbuca 450 MCG Film Generic drug: Buprenorphine HCl Place 450 mcg inside cheek in the morning and at bedtime.   cephALEXin 500 MG capsule Commonly known as: KEFLEX Take 500 mg by mouth 3 (three) times daily.   escitalopram 20 MG tablet Commonly known as: LEXAPRO Take 20 mg by mouth daily.   furosemide 20 MG tablet Commonly known as: LASIX Take 20 mg by mouth daily.   hydrochlorothiazide 12.5 MG capsule Commonly known as: MICROZIDE Take 12.5 mg by mouth daily.   losartan 100 MG tablet Commonly known as: COZAAR Take 100 mg by mouth daily.   ondansetron 4 MG tablet Commonly known as: Zofran Take 1 tablet (4 mg total) by mouth daily as needed for up to 20 doses for nausea or vomiting.   ORENCIA IV Inject 1 each into the vein every 30 (thirty) days. Psoriatic arthristis every 4 weeks   potassium chloride SA 20 MEQ tablet Commonly known as: KLOR-CON Take 40 mEq by mouth daily.   pregabalin 75 MG capsule Commonly known as: LYRICA Take 150 mg by mouth 3 (three) times daily.      Follow-up Information    Robyne Peers, MD Follow up in 1 week(s).   Specialty: Family Medicine Contact information: [id number] [place] [address] [phone]        Ronnette Juniper, MD. Schedule an appointment as soon as possible for a visit.   Specialty: Gastroenterology Contact information: [address] [place] [address] [phone]          Allergies  Allergen Reactions  . Nsaids Other (See Comments)    stroke CVA   . Benzoin Dermatitis and Other (See Comments)    Localized - Skin Red with Burning Sensation Skin turns red and gets inflamed     The results of significant diagnostics from this  hospitalization (including imaging, microbiology, ancillary and laboratory) are listed below for reference.    Microbiology: Recent Results (from the past 240 hour(s))  SARS Coronavirus 2 by RT PCR (hospital order, performed in [facility] hospital lab) Nasopharyngeal Nasopharyngeal Swab     Status: None   Collection Time: 01/24/20  6:24 PM   Specimen: Nasopharyngeal Swab  Result Value Ref Range Status   SARS Coronavirus 2 NEGATIVE NEGATIVE Final    Comment: (NOTE) SARS-CoV-2 target nucleic acids are NOT DETECTED. The SARS-CoV-2 RNA is generally detectable in upper and lower respiratory specimens during the acute phase of infection. The lowest concentration of SARS-CoV-2 viral copies this assay can detect is 250 copies / mL. A negative result does not preclude SARS-CoV-2 infection and should not be used as the sole basis for treatment or other patient management decisions.  A negative result may occur with improper specimen collection /  handling, submission of specimen other than nasopharyngeal swab, presence of viral mutation(s) within the areas targeted by this assay, and inadequate number of viral copies (<250 copies / mL). A negative result must be combined with clinical observations, patient history, and epidemiological information. Fact Sheet for Patients:   StrictlyIdeas.no Fact Sheet for Healthcare Providers: BankingDealers.co.za This test is not yet approved or cleared  by the [place] FDA and has been authorized for detection and/or diagnosis of SARS-CoV-2 by FDA under an Emergency Use Authorization (EUA).  This EUA will remain in effect (meaning this test can be used) for the duration of the COVID-19 declaration under Section 564(b)(1) of the Act, 21 U.S.C. section 360bbb-3(b)(1), unless the authorization is terminated or revoked sooner. Performed at [facility], [place] [address] [place], [place]  [address]   Gastrointestinal Panel by PCR , Stool     Status: None   Collection Time: 01/25/20  8:05 AM   Specimen: Stool  Result Value Ref Range Status   Campylobacter species NOT DETECTED NOT DETECTED Final   Plesimonas shigelloides NOT DETECTED NOT DETECTED Final   Salmonella species NOT DETECTED NOT DETECTED Final   Yersinia enterocolitica NOT DETECTED NOT DETECTED Final   Vibrio species NOT DETECTED NOT DETECTED Final   Vibrio cholerae NOT DETECTED NOT DETECTED Final   Enteroaggregative E coli (EAEC) NOT DETECTED NOT DETECTED Final   Enteropathogenic E coli (EPEC) NOT DETECTED NOT DETECTED Final   Enterotoxigenic E coli (ETEC) NOT DETECTED NOT DETECTED Final   Shiga like toxin producing E coli (STEC) NOT DETECTED NOT DETECTED Final   Shigella/Enteroinvasive E coli (EIEC) NOT DETECTED NOT DETECTED Final   Cryptosporidium NOT DETECTED NOT DETECTED Final   Cyclospora cayetanensis NOT DETECTED NOT DETECTED Final   Entamoeba histolytica NOT DETECTED NOT DETECTED Final   Giardia lamblia NOT DETECTED NOT DETECTED Final   Adenovirus F40/41 NOT DETECTED NOT DETECTED Final   Astrovirus NOT DETECTED NOT DETECTED Final   Norovirus GI/GII NOT DETECTED NOT DETECTED Final   Rotavirus A NOT DETECTED NOT DETECTED Final   Sapovirus (I, II, IV, and V) NOT DETECTED NOT DETECTED Final    Comment: Performed at [facility], [address] [place], [place] [address]  C Difficile Quick Screen w PCR reflex     Status: None   Collection Time: 01/25/20  8:05 AM   Specimen: Stool  Result Value Ref Range Status   C Diff antigen NEGATIVE NEGATIVE Final   C Diff toxin NEGATIVE NEGATIVE Final   C Diff interpretation No C. difficile detected.  Final    Comment: Performed at [facility], [place] [address] [place], [place] [address]    Procedures/Studies: DG Abdomen 1 View  Result Date: 01/24/2020 CLINICAL DATA:  Vomiting EXAM: ABDOMEN - 1 VIEW COMPARISON:  CT abdomen pelvis dated  November 19, 2019 peer FINDINGS: Patient is status post bilateral total hip arthroplasty. There is a long stem prosthesis on the left. There is some lucency about the left hip prosthesis. The patient is status post bilateral tubal ligation. The bowel gas pattern is nonobstructive. The patient is likely status post cholecystectomy. There are no definite radiopaque kidney stones. IMPRESSION: 1. No definite radiopaque kidney stones identified. 2. Nonspecific bowel gas pattern. 3. Left total hip arthroplasty hardware in place. There is some lucency about the left hip prosthesis which can be seen with loosening. Electronically Signed   By: Constance Holster M.D.   On: 01/24/2020 21:08   CT HEAD  WO CONTRAST  Result Date: 01/21/2020 CLINICAL DATA:  Slurred speech. EXAM: CT HEAD WITHOUT CONTRAST TECHNIQUE: Contiguous axial images were obtained from the base of the skull through the vertex without intravenous contrast. COMPARISON:  November 19, 2019 FINDINGS: Brain: No evidence of acute infarction, hemorrhage, hydrocephalus, extra-axial collection or mass lesion/mass effect. A chronic right basal ganglia lacunar infarct is seen. Vascular: No hyperdense vessel or unexpected calcification. Skull: Normal. Negative for fracture or focal lesion. Sinuses/Orbits: Stable moderate severity anterior left maxillary sinus mucosal thickening is seen. Other: None. IMPRESSION: 1. No acute intracranial abnormality. 2. Chronic right basal ganglia lacunar infarct. 3. Stable moderate severity anterior left maxillary sinus mucosal thickening. Electronically Signed   By: Virgina Norfolk M.D.   On: 01/21/2020 23:15   MR BRAIN WO CONTRAST  Result Date: 01/22/2020 CLINICAL DATA:  Initial evaluation for acute slurred speech. EXAM: MRI HEAD WITHOUT CONTRAST TECHNIQUE: Multiplanar, multiecho pulse sequences of the brain and surrounding structures were obtained without intravenous contrast. COMPARISON:  Prior CT from 01/21/2020. FINDINGS: Brain:  Cerebral volume within normal limits for age. No significant cerebral white matter disease. Remote lacunar infarct present at the posterior right lentiform nucleus. Associated chronic hemosiderin staining. No abnormal foci of restricted diffusion to suggest acute or subacute ischemia. Gray-white matter differentiation maintained. No other areas of encephalomalacia to suggest chronic cortical infarction. No other evidence for acute or chronic intracranial hemorrhage. No mass lesion, midline shift or mass effect. No hydrocephalus or extra-axial fluid collection. Pituitary gland suprasellar region normal. Midline structures intact. Vascular: Major intracranial vascular flow voids are maintained. Skull and upper cervical spine: Craniocervical junction within normal limits. Signal intensity within the visualized bone marrow diffusely decreased on T1 weighted imaging, nonspecific, but most commonly related to anemia, smoking, or obesity. No focal marrow replacing lesion. No scalp soft tissue abnormality. Sinuses/Orbits: Globes and orbital soft tissues within normal limits. Probable mucocele at the left frontoethmoidal recess noted. Paranasal sinuses are otherwise clear. No mastoid effusion. Inner ear structures grossly normal. Other: None. IMPRESSION: 1. No acute intracranial abnormality. 2. Remote right basal ganglia lacunar infarct. Electronically Signed   By: Jeannine Boga M.D.   On: 01/22/2020 03:09    Labs: BNP (last 3 results) Recent Labs    08/12/19 1330 08/13/19 0421 08/14/19 0337  BNP 283.1* 536.7* 123XX123*   Basic Metabolic Panel: Recent Labs  Lab 01/21/20 2204 01/21/20 2204 01/21/20 2213 01/24/20 1541 01/25/20 0437 01/25/20 1136 01/26/20 0508  NA 137  --  135 134* 137  --  138  K 3.6   < > 3.6 3.3* 3.2* 3.5 3.4*  CL 95*  --  94* 92* 100  --  105  CO2 27  --   --  22 27  --  26  GLUCOSE 90  --  89 107* 103*  --  90  BUN 14  --  14 20 16   --  8  CREATININE 0.80  --  1.10* 0.94  0.59  --  0.43*  CALCIUM 9.0  --   --  8.2* 7.4*  --  7.6*  MG  --   --   --   --  2.0  --   --   PHOS  --   --   --   --  2.6  --   --    < > = values in this interval not displayed.   Liver Function Tests: Recent Labs  Lab 01/21/20 2204 01/24/20 1541 01/25/20 0437 01/26/20 0508  AST 31  68* 48* 44*  ALT 29 51* 42 35  ALKPHOS 83 86 75 64  BILITOT 1.0 0.9 1.4* 1.0  PROT 6.9 7.3 6.1* 5.3*  ALBUMIN 4.2 4.4 3.7 3.2*   Recent Labs  Lab 01/24/20 1541  LIPASE 23   No results for input(s): AMMONIA in the last 168 hours. CBC: Recent Labs  Lab 01/21/20 2204 01/21/20 2213 01/24/20 1541 01/25/20 0437 01/26/20 0508  WBC 9.7  --  7.9 7.9 5.7  NEUTROABS 6.7  --   --  5.5  --   HGB 14.7 15.0 15.5* 13.1 11.7*  HCT 43.8 44.0 46.3* 39.3 36.7  MCV 95.8  --  96.9 97.3 100.3*  PLT 375  --  331 256 210   Cardiac Enzymes: No results for input(s): CKTOTAL, CKMB, CKMBINDEX, TROPONINI in the last 168 hours. BNP: Invalid input(s): POCBNP CBG: Recent Labs  Lab 01/21/20 2151  GLUCAP 92   D-Dimer No results for input(s): DDIMER in the last 72 hours. Hgb A1c Recent Labs    01/25/20 0437  HGBA1C 5.5   Lipid Profile No results for input(s): CHOL, HDL, LDLCALC, TRIG, CHOLHDL, LDLDIRECT in the last 72 hours. Thyroid function studies Recent Labs    01/25/20 0437  TSH 1.597   Anemia work up No results for input(s): VITAMINB12, FOLATE, FERRITIN, TIBC, IRON, RETICCTPCT in the last 72 hours. Urinalysis    Component Value Date/Time   COLORURINE YELLOW 01/24/2020 1541   APPEARANCEUR HAZY (A) 01/24/2020 1541   LABSPEC 1.012 01/24/2020 1541   PHURINE 5.0 01/24/2020 1541   GLUCOSEU NEGATIVE 01/24/2020 1541   HGBUR SMALL (A) 01/24/2020 1541   BILIRUBINUR NEGATIVE 01/24/2020 1541   KETONESUR NEGATIVE 01/24/2020 1541   PROTEINUR NEGATIVE 01/24/2020 1541   UROBILINOGEN 0.2 07/17/2014 0105   NITRITE NEGATIVE 01/24/2020 1541   LEUKOCYTESUR NEGATIVE 01/24/2020 1541   Sepsis  Labs Invalid input(s): PROCALCITONIN,  WBC,  LACTICIDVEN Microbiology Recent Results (from the past 240 hour(s))  SARS Coronavirus 2 by RT PCR (hospital order, performed in [facility] hospital lab) Nasopharyngeal Nasopharyngeal Swab     Status: None   Collection Time: 01/24/20  6:24 PM   Specimen: Nasopharyngeal Swab  Result Value Ref Range Status   SARS Coronavirus 2 NEGATIVE NEGATIVE Final    Comment: (NOTE) SARS-CoV-2 target nucleic acids are NOT DETECTED. The SARS-CoV-2 RNA is generally detectable in upper and lower respiratory specimens during the acute phase of infection. The lowest concentration of SARS-CoV-2 viral copies this assay can detect is 250 copies / mL. A negative result does not preclude SARS-CoV-2 infection and should not be used as the sole basis for treatment or other patient management decisions.  A negative result may occur with improper specimen collection / handling, submission of specimen other than nasopharyngeal swab, presence of viral mutation(s) within the areas targeted by this assay, and inadequate number of viral copies (<250 copies / mL). A negative result must be combined with clinical observations, patient history, and epidemiological information. Fact Sheet for Patients:   StrictlyIdeas.no Fact Sheet for Healthcare Providers: BankingDealers.co.za This test is not yet approved or cleared  by the [place] FDA and has been authorized for detection and/or diagnosis of SARS-CoV-2 by FDA under an Emergency Use Authorization (EUA).  This EUA will remain in effect (meaning this test can be used) for the duration of the COVID-19 declaration under Section 564(b)(1) of the Act, 21 U.S.C. section 360bbb-3(b)(1), unless the authorization is terminated or revoked sooner. Performed at [facility], [place]  [address] [place], [place] [address]   Gastrointestinal Panel by PCR , Stool      Status: None   Collection Time: 01/25/20  8:05 AM   Specimen: Stool  Result Value Ref Range Status   Campylobacter species NOT DETECTED NOT DETECTED Final   Plesimonas shigelloides NOT DETECTED NOT DETECTED Final   Salmonella species NOT DETECTED NOT DETECTED Final   Yersinia enterocolitica NOT DETECTED NOT DETECTED Final   Vibrio species NOT DETECTED NOT DETECTED Final   Vibrio cholerae NOT DETECTED NOT DETECTED Final   Enteroaggregative E coli (EAEC) NOT DETECTED NOT DETECTED Final   Enteropathogenic E coli (EPEC) NOT DETECTED NOT DETECTED Final   Enterotoxigenic E coli (ETEC) NOT DETECTED NOT DETECTED Final   Shiga like toxin producing E coli (STEC) NOT DETECTED NOT DETECTED Final   Shigella/Enteroinvasive E coli (EIEC) NOT DETECTED NOT DETECTED Final   Cryptosporidium NOT DETECTED NOT DETECTED Final   Cyclospora cayetanensis NOT DETECTED NOT DETECTED Final   Entamoeba histolytica NOT DETECTED NOT DETECTED Final   Giardia lamblia NOT DETECTED NOT DETECTED Final   Adenovirus F40/41 NOT DETECTED NOT DETECTED Final   Astrovirus NOT DETECTED NOT DETECTED Final   Norovirus GI/GII NOT DETECTED NOT DETECTED Final   Rotavirus A NOT DETECTED NOT DETECTED Final   Sapovirus (I, II, IV, and V) NOT DETECTED NOT DETECTED Final    Comment: Performed at [facility], [address] [place], [place] [address]  C Difficile Quick Screen w PCR reflex     Status: None   Collection Time: 01/25/20  8:05 AM   Specimen: Stool  Result Value Ref Range Status   C Diff antigen NEGATIVE NEGATIVE Final   C Diff toxin NEGATIVE NEGATIVE Final   C Diff interpretation No C. difficile detected.  Final    Comment: Performed at [facility], [place] [address] [place], [place] [address]     Time coordinating discharge: 25  minutes  SIGNED: Antonieta Pert, MD  [facility] 01/26/2020, 12:11 PM  If 7PM-7AM, please contact night-coverage [email or web address]

## 2020-02-08 ENCOUNTER — Telehealth: Payer: Self-pay | Admitting: Hematology and Oncology

## 2020-02-08 NOTE — Telephone Encounter (Signed)
R/s appt per 6/7 sch message - called pt . No answer ,.left message with appt date and time

## 2020-02-14 ENCOUNTER — Emergency Department (HOSPITAL_COMMUNITY)
Admission: EM | Admit: 2020-02-14 | Discharge: 2020-02-14 | Disposition: A | Discharge status: Home / Self Care | Payer: BC Managed Care – PPO | Attending: Emergency Medicine | Admitting: Emergency Medicine

## 2020-02-14 ENCOUNTER — Other Ambulatory Visit: Payer: Self-pay

## 2020-02-14 DIAGNOSIS — W19XXXA Unspecified fall, initial encounter: Secondary | ICD-10-CM | POA: Diagnosis not present

## 2020-02-14 DIAGNOSIS — F10920 Alcohol use, unspecified with intoxication, uncomplicated: Secondary | ICD-10-CM | POA: Insufficient documentation

## 2020-02-14 DIAGNOSIS — Z7982 Long term (current) use of aspirin: Secondary | ICD-10-CM | POA: Insufficient documentation

## 2020-02-14 DIAGNOSIS — R4182 Altered mental status, unspecified: Secondary | ICD-10-CM | POA: Diagnosis present

## 2020-02-14 DIAGNOSIS — I10 Essential (primary) hypertension: Secondary | ICD-10-CM | POA: Diagnosis not present

## 2020-02-14 LAB — CBC WITH DIFFERENTIAL/PLATELET
Abs Immature Granulocytes: 0.12 10*3/uL — ABNORMAL HIGH (ref 0.00–0.07)
Basophils Absolute: 0.1 10*3/uL (ref 0.0–0.1)
Basophils Relative: 1 %
Eosinophils Absolute: 0.2 10*3/uL (ref 0.0–0.5)
Eosinophils Relative: 1 %
HCT: 36.9 % (ref 36.0–46.0)
Hemoglobin: 11.9 g/dL — ABNORMAL LOW (ref 12.0–15.0)
Immature Granulocytes: 1 %
Lymphocytes Relative: 16 %
Lymphs Abs: 1.9 10*3/uL (ref 0.7–4.0)
MCH: 31.3 pg (ref 26.0–34.0)
MCHC: 32.2 g/dL (ref 30.0–36.0)
MCV: 97.1 fL (ref 80.0–100.0)
Monocytes Absolute: 0.7 10*3/uL (ref 0.1–1.0)
Monocytes Relative: 5 %
Neutro Abs: 9 10*3/uL — ABNORMAL HIGH (ref 1.7–7.7)
Neutrophils Relative %: 76 %
Platelets: 550 10*3/uL — ABNORMAL HIGH (ref 150–400)
RBC: 3.8 MIL/uL — ABNORMAL LOW (ref 3.87–5.11)
RDW: 13.6 % (ref 11.5–15.5)
WBC: 12 10*3/uL — ABNORMAL HIGH (ref 4.0–10.5)
nRBC: 0 % (ref 0.0–0.2)

## 2020-02-14 LAB — COMPREHENSIVE METABOLIC PANEL
ALT: 31 U/L (ref 0–44)
AST: 33 U/L (ref 15–41)
Albumin: 4 g/dL (ref 3.5–5.0)
Alkaline Phosphatase: 93 U/L (ref 38–126)
Anion gap: 16 — ABNORMAL HIGH (ref 5–15)
BUN: 7 mg/dL (ref 6–20)
CO2: 25 mmol/L (ref 22–32)
Calcium: 8.8 mg/dL — ABNORMAL LOW (ref 8.9–10.3)
Chloride: 102 mmol/L (ref 98–111)
Creatinine, Ser: 0.53 mg/dL (ref 0.44–1.00)
GFR calc Af Amer: >60 mL/min (ref >60)
GFR calc non Af Amer: >60 mL/min (ref >60)
Glucose, Bld: 99 mg/dL (ref 70–99)
Potassium: 3.5 mmol/L (ref 3.5–5.1)
Sodium: 143 mmol/L (ref 135–145)
Total Bilirubin: 0.7 mg/dL (ref 0.3–1.2)
Total Protein: 7 g/dL (ref 6.5–8.1)

## 2020-02-14 LAB — URINALYSIS, ROUTINE W REFLEX MICROSCOPIC
Bilirubin Urine: NEGATIVE
Glucose, UA: NEGATIVE mg/dL
Hgb urine dipstick: NEGATIVE
Ketones, ur: NEGATIVE mg/dL
Leukocytes,Ua: NEGATIVE
Nitrite: NEGATIVE
Protein, ur: NEGATIVE mg/dL
Specific Gravity, Urine: 1.003 — ABNORMAL LOW (ref 1.005–1.030)
pH: 7 (ref 5.0–8.0)

## 2020-02-14 LAB — RAPID URINE DRUG SCREEN, HOSP PERFORMED
Amphetamines: NOT DETECTED
Barbiturates: NOT DETECTED
Benzodiazepines: NOT DETECTED
Cocaine: NOT DETECTED
Opiates: NOT DETECTED
Tetrahydrocannabinol: NOT DETECTED

## 2020-02-14 LAB — AMMONIA: Ammonia: 22 umol/L (ref 9–35)

## 2020-02-14 LAB — ETHANOL: Alcohol, Ethyl (B): 264 mg/dL — ABNORMAL HIGH (ref <10)

## 2020-02-14 MED ORDER — HYDROCODONE-ACETAMINOPHEN 5-325 MG PO TABS
1.0000 | ORAL_TABLET | Freq: Once | ORAL | Status: AC
  Administered 2020-02-14: 1 via ORAL
  Filled 2020-02-14: qty 1

## 2020-02-14 NOTE — ED Triage Notes (Addendum)
Patient BIB [facility], ems states they were originally called out for a fall. Upon arrival patients family stated patient has had altered mental status x2 days. Family stated that patient has experienced these symptoms before and it was due to hypokalemia. Patient reported to EMS that she had pain in right shoulder but the pain is chronic r/t right shoulder replacement two months ago.    Patient reports she is having spasms in right arm/ patient a/o x 4, patient states she did not fall today. Patient says she is just having spasms in right arm and that is why her husband called EMS

## 2020-02-14 NOTE — ED Provider Notes (Signed)
[facility] DEPT Provider Note   CSN: [id number] Arrival date & time: 02/14/20  1440     History Chief Complaint  Patient presents with  . Fall  . Altered Mental Status    [name] [name] is a 49 y.o. female.  HPI   49 year old female brought in by EMS after family called.  Family concerned about patient's mental status for the past 2 days.  Patient herself is not a good historian.  She is concerned that her potassium level might be low though.  She states that she has had symptoms of forgetfulness with hypokalemia previously.  She is also complaining of right shoulder pain but this is more chronic in nature and she recently had shoulder surgery.  She has been taking Vicodin for pain.  She states that she has been taking this as prescribed.  Denies any other ingestion.  Report of possible fall, but patient denies this.  Past Medical History:  Diagnosis Date  . Anxiety   . Blood transfusion without reported diagnosis   . Coronary artery disease   . Hyperlipidemia   . Hypertension   . Iron deficiency anemia 01/15/2019  . 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 Psoriatic arthritis ([place])   . Stroke [facility]) 2014    Patient Active Problem List   Diagnosis Date Noted  . Intractable nausea and vomiting 01/24/2020  . Prolonged QT interval 01/24/2020  . Chronic pain 01/24/2020  . Nausea and vomiting 11/20/2019  . Syncope 11/20/2019  . Dehydration 11/20/2019  . Transaminitis 11/20/2019  . Lactic acidosis 11/20/2019  . Acute hyponatremia 11/20/2019  . AKI (acute kidney injury) ([place]) 11/19/2019  . Bone spur of right foot 09/01/2019  . Pain from implanted hardware 09/01/2019  . Acquired pes planovalgus of right foot 09/01/2019  . Person under investigation for COVID-19   . Acute respiratory failure with hypoxia ([place]) 08/12/2019  . COVID-19 virus infection: Presumed  08/12/2019  . Community acquired pneumonia 08/11/2019  . Multifocal pneumonia 08/10/2019  . Iron deficiency anemia 01/15/2019  . B12 deficiency 01/01/2019  . Immunosuppressed status ([place]) 12/31/2018  . Myoclonic jerking 12/29/2018  . Generalized anxiety disorder 08/04/2018  . Nontraumatic complete tear of right rotator cuff 07/08/2018  . Obesity with serious comorbidity 01/17/2018  . Normochromic normocytic anemia 12/24/2017  . 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 Right leg swelling 02/21/2017  . Asthmatic bronchitis with acute exacerbation 02/15/2017  . Noncompliance with treatment plan 12/27/2016  . Synovitis of right foot 12/04/2016  . Ganglion of foot, right 11/09/2016  . Hypokalemia 10/12/2016  . Hyperglycemia 10/11/2016  . IFG (impaired fasting glucose) 10/11/2016  . Lumbar trigger point syndrome 03/13/2016  . Pap smear for cervical cancer screening 02/17/2016  . Elevated liver enzymes 01/27/2016  . Long term (current) use of antibiotics 01/27/2016  . Cellulitis of left lower limb 01/03/2016  . Wound dehiscence 01/03/2016  . Loose total hip arthroplasty ([place]) 12/20/2015  . Foot pain, right 09/09/2015  . Screening for breast cancer 02/16/2015  . Left axillary hidradenitis 02/16/2015  . Epidermal inclusion cyst 01/11/2015  . Urticaria 06/22/2014  . Abnormal finding of blood chemistry 02/23/2014  . Benign essential hypertension 02/23/2014  . Brain neoplasm ([place]) 02/23/2014  . 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 Edema 02/23/2014  . Elevated blood-pressure reading without diagnosis of  hypertension 02/23/2014  . Elevated WBC count 02/23/2014  . Hearing loss of both ears 02/23/2014  . High risk medication use 02/23/2014  . Hip joint painful on movement 02/23/2014  . Mixed hyperlipidemia 02/23/2014  . Dislocation of right patella 02/23/2014  . Irritable bowel  syndrome 02/23/2014  . Primary osteoarthritis involving multiple joints 02/23/2014  . Sudden idiopathic hearing loss 02/23/2014  . Primary osteoarthritis of left knee 02/23/2014  . Primary osteoarthritis of right knee 02/23/2014  . Osteoarthritis 02/23/2014  . Internal derangement of left knee 02/23/2014  . Pain in joint, pelvic region and thigh 02/23/2014  . Pain in joint, ankle and foot 02/23/2014  . Coccydynia 02/23/2014  . Right-sided low back pain with sciatica 02/19/2014  . 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 Fall at home 10/04/2012  . Acute ischemic stroke ([place]) 01/19/2012  . Hypertension 01/19/2012  . Anxiety 01/19/2012  . Chronic knee pain 01/19/2012  . Restless legs syndrome 09/05/2010  . HYPERSOMNIA 06/04/2010    Past Surgical History:  Procedure Laterality Date  . CARPAL TUNNEL RELEASE     B/L hand  . CHOLECYSTECTOMY    . FOOT SURGERY     with hardware   . HERNIA REPAIR    . KNEE ARTHROSCOPY Bilateral 08/2014   [facility] Dr. Len Childs  . TEE WITHOUT CARDIOVERSION  01/22/2012   Procedure: TRANSESOPHAGEAL ECHOCARDIOGRAM (TEE);  Surgeon: Lelon Perla, MD;  Location: [facility] ENDOSCOPY;  Service: Cardiovascular;  Laterality: N/A;  . TOTAL HIP ARTHROPLASTY Bilateral      OB History   No obstetric history on file.     Family History  Problem Relation Age of Onset  . Hypertension Mother   . Hypertension Father   . Colon cancer Maternal Grandfather   . Colon cancer Paternal Grandfather   . Colon polyps Neg Hx   . Esophageal cancer Neg Hx   . Rectal cancer Neg Hx   . Stomach cancer Neg Hx     Social History   Tobacco Use  . Smoking status: Former Research scientist (life sciences)  . Smokeless tobacco: Never Used  . Tobacco comment: quit 10 years ago, smoked intermittently for few years. Less than 10 yrs total.  Vaping Use  . Vaping Use: Never used  Substance Use Topics  . Alcohol use: Yes    Comment: occ- 1-2 drinks a week  . Drug use: No    Home Medications Prior  to Admission medications   Medication Sig Start Date End Date Taking? Authorizing Provider  Abatacept (ORENCIA IV) Inject 1 each into the vein every 30 (thirty) days. Psoriatic arthristis every 4 weeks     [provider]  aspirin 325 MG EC tablet Take 325 mg by mouth daily.     [provider]  atorvastatin (LIPITOR) 40 MG tablet Take 40 mg by mouth daily. 07/06/19   [provider]  Buprenorphine HCl (BELBUCA) 450 MCG FILM Place 450 mcg inside cheek in the morning and at bedtime.  09/04/19   [provider]  cephALEXin (KEFLEX) 500 MG capsule Take 500 mg by mouth 3 (three) times daily. 01/12/20   [provider]  escitalopram (LEXAPRO) 20 MG tablet Take 20 mg by mouth daily.    [provider]  furosemide (LASIX) 20 MG tablet Take 20 mg by mouth daily. 01/17/20   [provider]  hydrochlorothiazide (MICROZIDE) 12.5 MG capsule Take 12.5 mg by mouth daily.  12/28/19   [provider]  losartan (COZAAR) 100 MG tablet Take 100  mg by mouth daily. 12/21/19   [provider]  ondansetron (ZOFRAN) 4 MG tablet Take 1 tablet (4 mg total) by mouth daily as needed for up to 20 doses for nausea or vomiting. 01/26/20   Antonieta Pert, MD  potassium chloride SA (KLOR-CON) 20 MEQ tablet Take 40 mEq by mouth daily.  12/08/19   [provider]  pregabalin (LYRICA) 75 MG capsule Take 150 mg by mouth 3 (three) times daily.     [provider]  traZODone (DESYREL) 50 MG tablet TAKE 1 TABLET BY MOUTH ONE HOUR BEFORE BEDTIME 09/09/19 09/22/19  [provider]    Allergies    Nsaids and Benzoin  Review of Systems   Review of Systems All systems reviewed and negative, other than as noted in HPI.  Physical Exam Updated Vital Signs BP 110/60   Pulse 76   Temp 98 F (36.7 C) (Oral)   Resp 16   Ht 5\' 4"  (1.626 m)   Wt 94.8 kg   LMP 02/04/2020   SpO2 95%   BMI 35.87 kg/m   Physical Exam Vitals and nursing note  reviewed.  Constitutional:      General: She is not in acute distress.    Appearance: She is well-developed.  HENT:     Head: Normocephalic and atraumatic.  Eyes:     General:        Right eye: No discharge.        Left eye: No discharge.     Conjunctiva/sclera: Conjunctivae normal.  Cardiovascular:     Rate and Rhythm: Normal rate and regular rhythm.     Heart sounds: Normal heart sounds. No murmur heard.  No friction rub. No gallop.   Pulmonary:     Effort: Pulmonary effort is normal. No respiratory distress.     Breath sounds: Normal breath sounds.  Abdominal:     General: There is no distension.     Palpations: Abdomen is soft.     Tenderness: There is no abdominal tenderness.  Musculoskeletal:     Cervical back: Neck supple.     Comments: R arm in should immobilizer. Surgical site appears to be healing fine.   Skin:    General: Skin is warm and dry.  Neurological:     Mental Status: She is alert.     Comments: Mildly drowsy. Opens eyes to voice. Slow to answer questions but answers are appropriate. AOx4. CN 2-12 intact. Grip strength good R hand. 5/5 strength rest of extremities.   Psychiatric:        Behavior: Behavior normal.        Thought Content: Thought content normal.     ED Results / Procedures / Treatments   Labs (all labs ordered are listed, but only abnormal results are displayed) Labs Reviewed  CBC WITH DIFFERENTIAL/PLATELET - Abnormal; Notable for the following components:      Result Value   WBC 12.0 (*)    RBC 3.80 (*)    Hemoglobin 11.9 (*)    Platelets 550 (*)    Neutro Abs 9.0 (*)    Abs Immature Granulocytes 0.12 (*)    All other components within normal limits  COMPREHENSIVE METABOLIC PANEL - Abnormal; Notable for the following components:   Calcium 8.8 (*)    Anion gap 16 (*)    All other components within normal limits  ETHANOL - Abnormal; Notable for the following components:   Alcohol, Ethyl (B) 264 (*)    All other components  within  normal limits  URINALYSIS, ROUTINE W REFLEX MICROSCOPIC - Abnormal; Notable for the following components:   Color, Urine STRAW (*)    Specific Gravity, Urine 1.003 (*)    All other components within normal limits  RAPID URINE DRUG SCREEN, HOSP PERFORMED  AMMONIA    EKG None  Radiology No results found.  Procedures Procedures (including critical care time)  Medications Ordered in ED Medications  HYDROcodone-acetaminophen (NORCO/VICODIN) 5-325 MG per tablet 1 tablet (1 tablet Oral Given 02/14/20 1603)    ED Course  I have reviewed the triage vital signs and the nursing notes.  Pertinent labs & imaging results that were available during my care of the patient were reviewed by me and considered in my medical decision making (see chart for details).    MDM Rules/Calculators/A&P                          33yF with AMS. Likely from alcohol intoxication. Ethanol 260. She continually denies drinking though. She gave me permission to speak with her husband and I discussed her lab results. He was unaware that she has been drinking. Both of them concerned about her potassium level. It is normal. Her behavior/demeanor is consistent with alcohol intoxication. There doesn't appear to be an obvious alternative etiology otherwise at this time.    Final Clinical Impression(s) / ED Diagnoses Final diagnoses:  Alcoholic intoxication without complication [facility])    Rx / DC Orders ED Discharge Orders    None       Virgel Manifold, MD 02/15/20 1001

## 2020-02-14 NOTE — ED Notes (Signed)
Urine culture sent to lab.

## 2020-02-14 NOTE — ED Notes (Signed)
Contacted patient's husband per patient request. No answer at this time. Voicemail left.

## 2020-02-18 ENCOUNTER — Other Ambulatory Visit: Payer: Self-pay | Admitting: *Deleted

## 2020-02-18 ENCOUNTER — Inpatient Hospital Stay: Payer: BC Managed Care – PPO | Admitting: Hematology and Oncology

## 2020-02-18 ENCOUNTER — Inpatient Hospital Stay: Payer: BC Managed Care – PPO | Attending: Hematology and Oncology

## 2020-02-18 DIAGNOSIS — D500 Iron deficiency anemia secondary to blood loss (chronic): Secondary | ICD-10-CM

## 2020-02-19 ENCOUNTER — Other Ambulatory Visit: Payer: BC Managed Care – PPO

## 2020-02-19 ENCOUNTER — Ambulatory Visit: Payer: BC Managed Care – PPO | Admitting: Hematology and Oncology

## 2020-03-28 IMAGING — DX DG CHEST 1V PORT
1 series · 1 of 1 positions shown · non-contrast
Comparison: 08/03/2019

CLINICAL DATA: Shortness of breath

EXAM:
PORTABLE CHEST 1 VIEW

[chest ap]
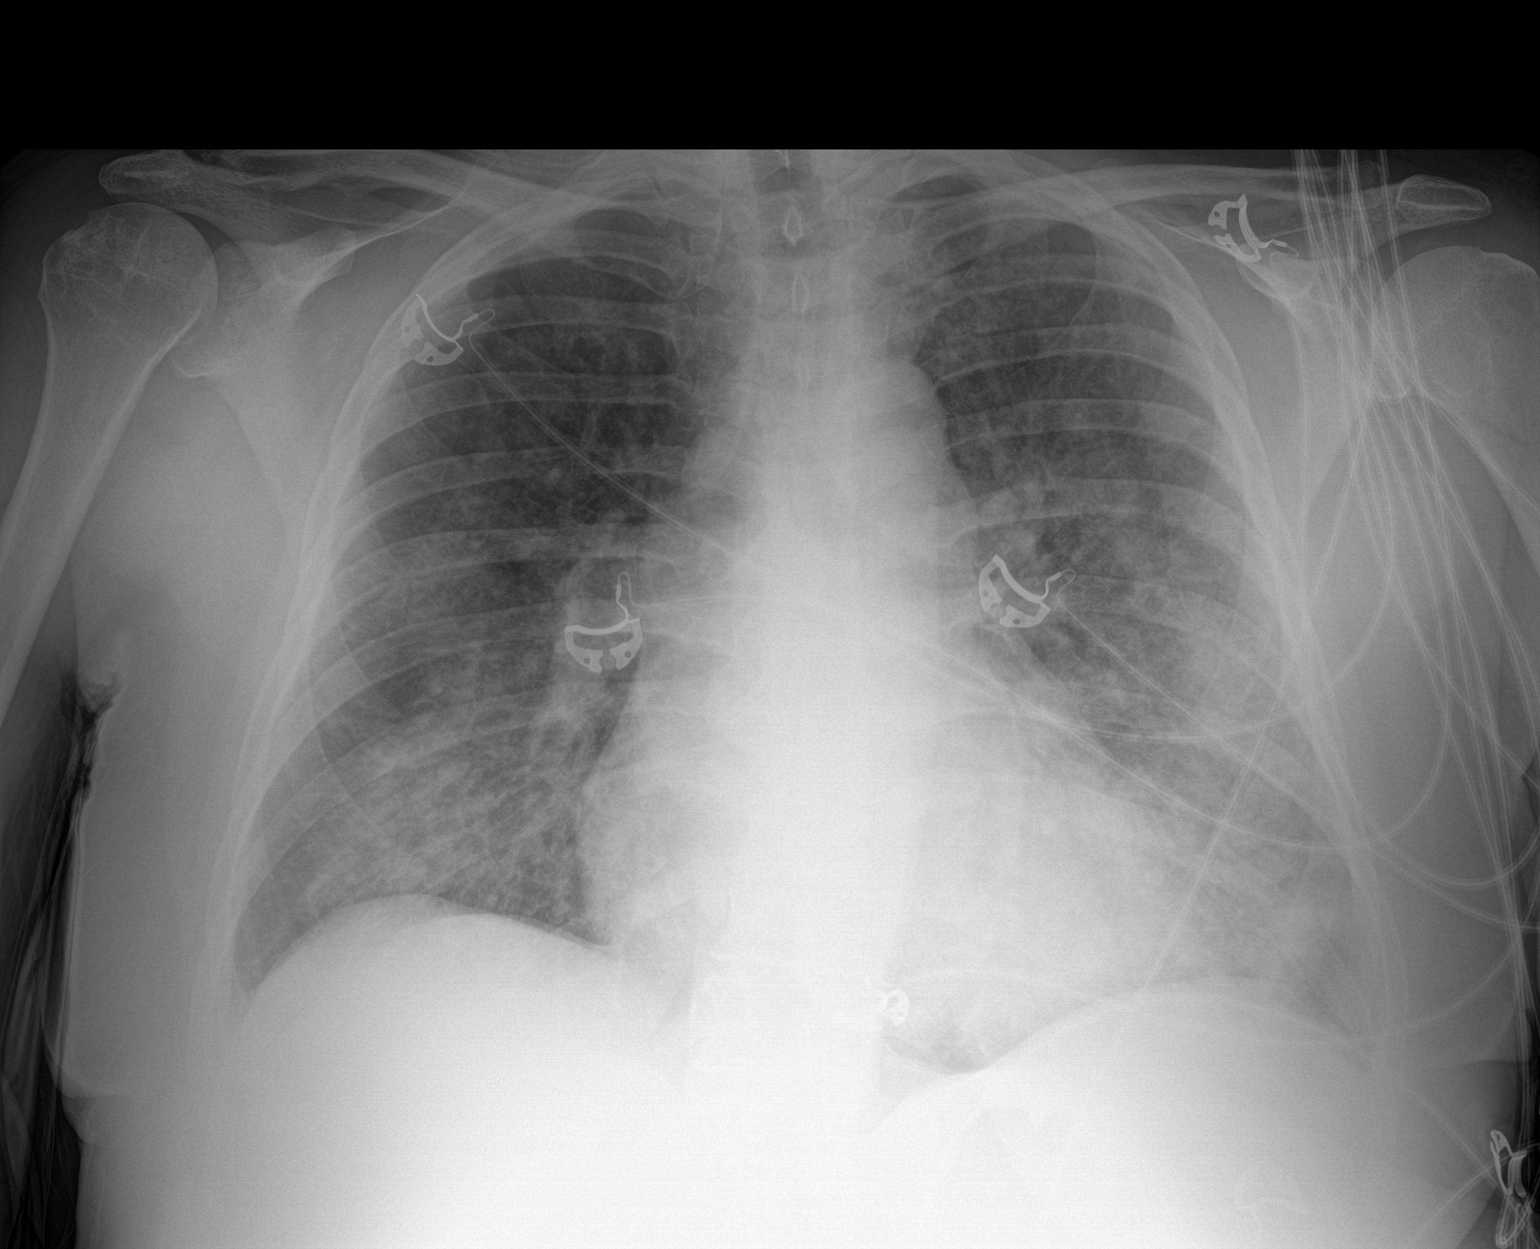

[1 of 1 positions shown; findings below may reference images not displayed]

FINDINGS: Mild subpleural patchy opacities in the lungs bilaterally, worrisome
for multifocal pneumonia. Relative sparing of the right upper lobe.
No pleural effusions. No pneumothorax.

The heart is normal in size.
IMPRESSION: Multifocal pneumonia. Atypical/viral etiologies (including COVID)
are possible.

## 2020-05-12 ENCOUNTER — Encounter (HOSPITAL_BASED_OUTPATIENT_CLINIC_OR_DEPARTMENT_OTHER): Payer: Self-pay | Admitting: Emergency Medicine

## 2020-05-12 ENCOUNTER — Other Ambulatory Visit: Payer: Self-pay

## 2020-05-12 ENCOUNTER — Inpatient Hospital Stay (HOSPITAL_BASED_OUTPATIENT_CLINIC_OR_DEPARTMENT_OTHER)
Admission: EM | Admit: 2020-05-12 | Discharge: 2020-05-14 | DRG: 392 | Disposition: A | Discharge status: Home / Self Care | Payer: BC Managed Care – PPO | Attending: Internal Medicine | Admitting: Internal Medicine

## 2020-05-12 DIAGNOSIS — L4050 Arthropathic psoriasis, unspecified: Secondary | ICD-10-CM | POA: Diagnosis present

## 2020-05-12 DIAGNOSIS — Z87891 Personal history of nicotine dependence: Secondary | ICD-10-CM

## 2020-05-12 DIAGNOSIS — E785 Hyperlipidemia, unspecified: Secondary | ICD-10-CM | POA: Diagnosis present

## 2020-05-12 DIAGNOSIS — E872 Acidosis, unspecified: Secondary | ICD-10-CM | POA: Diagnosis present

## 2020-05-12 DIAGNOSIS — I10 Essential (primary) hypertension: Secondary | ICD-10-CM | POA: Diagnosis present

## 2020-05-12 DIAGNOSIS — I2510 Atherosclerotic heart disease of native coronary artery without angina pectoris: Secondary | ICD-10-CM | POA: Diagnosis present

## 2020-05-12 DIAGNOSIS — Z9049 Acquired absence of other specified parts of digestive tract: Secondary | ICD-10-CM

## 2020-05-12 DIAGNOSIS — E876 Hypokalemia: Secondary | ICD-10-CM | POA: Diagnosis present

## 2020-05-12 DIAGNOSIS — A084 Viral intestinal infection, unspecified: Secondary | ICD-10-CM | POA: Diagnosis not present

## 2020-05-12 DIAGNOSIS — Z888 Allergy status to other drugs, medicaments and biological substances status: Secondary | ICD-10-CM

## 2020-05-12 DIAGNOSIS — G2581 Restless legs syndrome: Secondary | ICD-10-CM | POA: Diagnosis present

## 2020-05-12 DIAGNOSIS — E860 Dehydration: Secondary | ICD-10-CM | POA: Diagnosis present

## 2020-05-12 DIAGNOSIS — E871 Hypo-osmolality and hyponatremia: Secondary | ICD-10-CM | POA: Diagnosis present

## 2020-05-12 DIAGNOSIS — Z8673 Personal history of transient ischemic attack (TIA), and cerebral infarction without residual deficits: Secondary | ICD-10-CM

## 2020-05-12 DIAGNOSIS — Z20822 Contact with and (suspected) exposure to covid-19: Secondary | ICD-10-CM | POA: Diagnosis present

## 2020-05-12 DIAGNOSIS — Z8249 Family history of ischemic heart disease and other diseases of the circulatory system: Secondary | ICD-10-CM

## 2020-05-12 DIAGNOSIS — R112 Nausea with vomiting, unspecified: Secondary | ICD-10-CM | POA: Diagnosis not present

## 2020-05-12 DIAGNOSIS — Z96643 Presence of artificial hip joint, bilateral: Secondary | ICD-10-CM | POA: Diagnosis present

## 2020-05-12 DIAGNOSIS — Z79899 Other long term (current) drug therapy: Secondary | ICD-10-CM

## 2020-05-12 DIAGNOSIS — T501X5A Adverse effect of loop [high-ceiling] diuretics, initial encounter: Secondary | ICD-10-CM | POA: Diagnosis present

## 2020-05-12 DIAGNOSIS — Z7982 Long term (current) use of aspirin: Secondary | ICD-10-CM

## 2020-05-12 DIAGNOSIS — R197 Diarrhea, unspecified: Secondary | ICD-10-CM

## 2020-05-12 DIAGNOSIS — Z800 Family history of malignant neoplasm of digestive organs: Secondary | ICD-10-CM

## 2020-05-12 DIAGNOSIS — D509 Iron deficiency anemia, unspecified: Secondary | ICD-10-CM | POA: Diagnosis present

## 2020-05-12 DIAGNOSIS — X58XXXA Exposure to other specified factors, initial encounter: Secondary | ICD-10-CM | POA: Diagnosis present

## 2020-05-12 DIAGNOSIS — E861 Hypovolemia: Secondary | ICD-10-CM | POA: Diagnosis present

## 2020-05-12 DIAGNOSIS — F411 Generalized anxiety disorder: Secondary | ICD-10-CM | POA: Diagnosis present

## 2020-05-12 DIAGNOSIS — D72829 Elevated white blood cell count, unspecified: Secondary | ICD-10-CM | POA: Diagnosis present

## 2020-05-12 LAB — URINALYSIS, ROUTINE W REFLEX MICROSCOPIC
Bilirubin Urine: NEGATIVE
Glucose, UA: NEGATIVE mg/dL
Hgb urine dipstick: NEGATIVE
Ketones, ur: NEGATIVE mg/dL
Leukocytes,Ua: NEGATIVE
Nitrite: NEGATIVE
Protein, ur: NEGATIVE mg/dL
Specific Gravity, Urine: <1.005 — ABNORMAL LOW (ref 1.005–1.030)
pH: 6 (ref 5.0–8.0)

## 2020-05-12 LAB — CBC WITH DIFFERENTIAL/PLATELET
Abs Immature Granulocytes: 0.15 10*3/uL — ABNORMAL HIGH (ref 0.00–0.07)
Basophils Absolute: 0.1 10*3/uL (ref 0.0–0.1)
Basophils Relative: 1 %
Eosinophils Absolute: 0.1 10*3/uL (ref 0.0–0.5)
Eosinophils Relative: 1 %
HCT: 39.8 % (ref 36.0–46.0)
Hemoglobin: 13.5 g/dL (ref 12.0–15.0)
Immature Granulocytes: 1 %
Lymphocytes Relative: 10 %
Lymphs Abs: 1.7 10*3/uL (ref 0.7–4.0)
MCH: 27.2 pg (ref 26.0–34.0)
MCHC: 33.9 g/dL (ref 30.0–36.0)
MCV: 80.1 fL (ref 80.0–100.0)
Monocytes Absolute: 1.2 10*3/uL — ABNORMAL HIGH (ref 0.1–1.0)
Monocytes Relative: 7 %
Neutro Abs: 13.7 10*3/uL — ABNORMAL HIGH (ref 1.7–7.7)
Neutrophils Relative %: 80 %
Platelets: 432 10*3/uL — ABNORMAL HIGH (ref 150–400)
RBC: 4.97 MIL/uL (ref 3.87–5.11)
RDW: 15.3 % (ref 11.5–15.5)
WBC: 16.9 10*3/uL — ABNORMAL HIGH (ref 4.0–10.5)
nRBC: 0 % (ref 0.0–0.2)

## 2020-05-12 LAB — BASIC METABOLIC PANEL
Anion gap: 11 (ref 5–15)
BUN: 9 mg/dL (ref 6–20)
CO2: 27 mmol/L (ref 22–32)
Calcium: 8.9 mg/dL (ref 8.9–10.3)
Chloride: 86 mmol/L — ABNORMAL LOW (ref 98–111)
Creatinine, Ser: 0.71 mg/dL (ref 0.44–1.00)
GFR calc Af Amer: >60 mL/min (ref >60)
GFR calc non Af Amer: >60 mL/min (ref >60)
Glucose, Bld: 108 mg/dL — ABNORMAL HIGH (ref 70–99)
Potassium: 2.8 mmol/L — ABNORMAL LOW (ref 3.5–5.1)
Sodium: 124 mmol/L — ABNORMAL LOW (ref 135–145)

## 2020-05-12 LAB — COMPREHENSIVE METABOLIC PANEL
ALT: 20 U/L (ref 0–44)
AST: 23 U/L (ref 15–41)
Albumin: 4.6 g/dL (ref 3.5–5.0)
Alkaline Phosphatase: 110 U/L (ref 38–126)
Anion gap: 14 (ref 5–15)
BUN: 10 mg/dL (ref 6–20)
CO2: 21 mmol/L — ABNORMAL LOW (ref 22–32)
Calcium: 8.7 mg/dL — ABNORMAL LOW (ref 8.9–10.3)
Chloride: 81 mmol/L — ABNORMAL LOW (ref 98–111)
Creatinine, Ser: 0.85 mg/dL (ref 0.44–1.00)
GFR calc Af Amer: >60 mL/min (ref >60)
GFR calc non Af Amer: >60 mL/min (ref >60)
Glucose, Bld: 115 mg/dL — ABNORMAL HIGH (ref 70–99)
Potassium: 2.7 mmol/L — CL (ref 3.5–5.1)
Sodium: 116 mmol/L — CL (ref 135–145)
Total Bilirubin: 1.1 mg/dL (ref 0.3–1.2)
Total Protein: 7.4 g/dL (ref 6.5–8.1)

## 2020-05-12 LAB — PREGNANCY, URINE: Preg Test, Ur: NEGATIVE

## 2020-05-12 LAB — SARS CORONAVIRUS 2 BY RT PCR (HOSPITAL ORDER, PERFORMED IN ~~LOC~~ HOSPITAL LAB): SARS Coronavirus 2: NEGATIVE

## 2020-05-12 LAB — MAGNESIUM: Magnesium: 2 mg/dL (ref 1.7–2.4)

## 2020-05-12 MED ORDER — PREGABALIN 75 MG PO CAPS
150.0000 mg | ORAL_CAPSULE | Freq: Three times a day (TID) | ORAL | Status: DC
  Administered 2020-05-13 – 2020-05-14 (×3): 150 mg via ORAL
  Filled 2020-05-12 (×10): qty 2

## 2020-05-12 MED ORDER — BUPRENORPHINE HCL 450 MCG BU FILM: 450.0000 ug | ORAL_FILM | Freq: Two times a day (BID) | BUCCAL | Status: DC

## 2020-05-12 MED ORDER — POTASSIUM CHLORIDE CRYS ER 20 MEQ PO TBCR
40.0000 meq | EXTENDED_RELEASE_TABLET | Freq: Once | ORAL | Status: AC
  Administered 2020-05-12: 40 meq via ORAL
  Filled 2020-05-12: qty 2

## 2020-05-12 MED ORDER — ALUM & MAG HYDROXIDE-SIMETH 200-200-20 MG/5ML PO SUSP
30.0000 mL | Freq: Once | ORAL | Status: AC
  Administered 2020-05-12: 30 mL via ORAL
  Filled 2020-05-12: qty 30

## 2020-05-12 MED ORDER — ACETAMINOPHEN 500 MG PO TABS
1000.0000 mg | ORAL_TABLET | Freq: Once | ORAL | Status: AC
  Administered 2020-05-12: 1000 mg via ORAL
  Filled 2020-05-12: qty 2

## 2020-05-12 MED ORDER — SODIUM CHLORIDE 0.9 % IV BOLUS
1000.0000 mL | Freq: Once | INTRAVENOUS | Status: AC
  Administered 2020-05-12: 1000 mL via INTRAVENOUS

## 2020-05-12 MED ORDER — POTASSIUM CHLORIDE 20 MEQ PO PACK
40.0000 meq | PACK | Freq: Once | ORAL | Status: DC
  Filled 2020-05-12 (×2): qty 2

## 2020-05-12 MED ORDER — POTASSIUM CHLORIDE CRYS ER 20 MEQ PO TBCR
EXTENDED_RELEASE_TABLET | ORAL | Status: AC
  Administered 2020-05-12: 40 meq via ORAL
  Filled 2020-05-12: qty 2

## 2020-05-12 NOTE — ED Triage Notes (Signed)
Pt c/o SOB with nausea and diarrhea since Sunday. She went to UC and told to come to ED due to potassium of 3.2 and sodium of 128

## 2020-05-12 NOTE — ED Provider Notes (Signed)
[facility] Emergency Department Provider Note MRN:  [id number]  Arrival date & time: 05/12/20     Chief Complaint   Abnormal Lab and Shortness of Breath   History of Present Illness   [name] [name] is a 49 y.o. year-old female with a history of stroke, hypertension, CAD presenting to the ED with chief complaint of abnormal labs.  Diarrhea for the past 5 days, no recent travel, no recent antibiotics, no blood in the stool.  1-2 episodes of nonbloody nonbilious emesis as well.  Denies any significant abdominal pain, no chest pain or shortness of breath but does endorse dyspnea on exertion for the past 2 days.  Denies fever, no other complaints.  Symptoms mild to moderate, constant, no exacerbating relieving factors.  Advised to come ER for abnormal labs.  Review of Systems  A complete 10 system review of systems was obtained and all systems are negative except as noted in the HPI and PMH.   Patient's Health History    Past Medical History:  Diagnosis Date  . Anxiety   . Blood transfusion without reported diagnosis   . Coronary artery disease   . Hyperlipidemia   . Hypertension   . Iron deficiency anemia 01/15/2019  . 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 Psoriatic arthritis ([place])   . Stroke [facility]) 2014    Past Surgical History:  Procedure Laterality Date  . CARPAL TUNNEL RELEASE     B/L hand  . CHOLECYSTECTOMY    . FOOT SURGERY     with hardware   . HERNIA REPAIR    . KNEE ARTHROSCOPY Bilateral 08/2014   [facility] Dr. Len Childs  . TEE WITHOUT CARDIOVERSION  01/22/2012   Procedure: TRANSESOPHAGEAL ECHOCARDIOGRAM (TEE);  Surgeon: Lelon Perla, MD;  Location: [facility] ENDOSCOPY;  Service: Cardiovascular;  Laterality: N/A;  . TOTAL HIP ARTHROPLASTY Bilateral     Family History  Problem Relation Age of Onset  . Hypertension Mother   . Hypertension Father   . Colon cancer  Maternal Grandfather   . Colon cancer Paternal Grandfather   . Colon polyps Neg Hx   . Esophageal cancer Neg Hx   . Rectal cancer Neg Hx   . Stomach cancer Neg Hx     Social History   Socioeconomic History  . Marital status: Married    Spouse name: Not on file  . Number of children: Not on file  . Years of education: Not on file  . Highest education level: Not on file  Occupational History  . Not on file  Tobacco Use  . Smoking status: Former Research scientist (life sciences)  . Smokeless tobacco: Never Used  . Tobacco comment: quit 10 years ago, smoked intermittently for few years. Less than 10 yrs total.  Vaping Use  . Vaping Use: Never used  Substance and Sexual Activity  . Alcohol use: Yes    Comment: occ- 1-2 drinks a week  . Drug use: No  . Sexual activity: Not on file  Other Topics Concern  . Not on file  Social History Narrative   Lives in [place] with her husband.   Has 4 healthy kids.   Social Determinants of Health   Financial Resource Strain:   . Difficulty of Paying Living Expenses: Not on file  Food Insecurity:   . Worried About Charity fundraiser in the Last Year: Not on file  . Ran Out of Food  in the Last Year: Not on file  Transportation Needs:   . Lack of Transportation (Medical): Not on file  . Lack of Transportation (Non-Medical): Not on file  Physical Activity:   . Days of Exercise per Week: Not on file  . Minutes of Exercise per Session: Not on file  Stress:   . Feeling of Stress : Not on file  Social Connections:   . Frequency of Communication with Friends and Family: Not on file  . Frequency of Social Gatherings with Friends and Family: Not on file  . Attends Religious Services: Not on file  . Active Member of Clubs or Organizations: Not on file  . Attends Archivist Meetings: Not on file  . Marital Status: Not on file  Intimate Partner Violence:   . Fear of Current or Ex-Partner: Not on file  . Emotionally Abused: Not on file  . Physically  Abused: Not on file  . Sexually Abused: Not on file     Physical Exam   Vitals:   05/12/20 1806 05/12/20 2046  BP: (!) 143/106 (!) 145/93  Pulse: 96 86  Resp: 20 18  Temp: 98.3 F (36.8 C)   SpO2: 98% 97%    CONSTITUTIONAL: Chronically ill-appearing, NAD NEURO:  Alert and oriented x 3, no focal deficits EYES:  eyes equal and reactive ENT/NECK:  no LAD, no JVD CARDIO: Regular rate, well-perfused, normal S1 and S2 PULM:  CTAB no wheezing or rhonchi GI/GU:  normal bowel sounds, non-distended, non-tender MSK/SPINE:  No gross deformities, no edema SKIN:  no rash, atraumatic PSYCH:  Appropriate speech and behavior  *Additional and/or pertinent findings included in MDM below  Diagnostic and Interventional Summary    EKG Interpretation  Date/Time:  Thursday May 12 2020 18:17:00 EDT Ventricular Rate:  90 PR Interval:  136 QRS Duration: 90 QT Interval:  386 QTC Calculation: 472 R Axis:   54 Text Interpretation: Sinus rhythm with occasional Premature ventricular complexes Otherwise normal ECG Confirmed by Gerlene Fee [phone]) on 05/12/2020 8:49:08 PM      Labs Reviewed  CBC WITH DIFFERENTIAL/PLATELET - Abnormal; Notable for the following components:      Result Value   WBC 16.9 (*)    Platelets 432 (*)    Neutro Abs 13.7 (*)    Monocytes Absolute 1.2 (*)    Abs Immature Granulocytes 0.15 (*)    All other components within normal limits  COMPREHENSIVE METABOLIC PANEL - Abnormal; Notable for the following components:   Sodium 116 (*)    Potassium 2.7 (*)    Chloride 81 (*)    CO2 21 (*)    Glucose, Bld 115 (*)    Calcium 8.7 (*)    All other components within normal limits  SARS CORONAVIRUS 2 BY RT PCR (HOSPITAL ORDER, [place] [facility])  URINALYSIS, ROUTINE W REFLEX MICROSCOPIC  PREGNANCY, URINE  SODIUM, URINE, RANDOM  OSMOLALITY, URINE    No orders to display    Medications  potassium chloride SA (KLOR-CON) CR tablet 40 mEq (has no  administration in time range)  sodium chloride 0.9 % bolus 1,000 mL (1,000 mLs Intravenous New Bag/Given 05/12/20 2045)     Procedures  /  Critical Care .Critical Care Performed by: Maudie Flakes, MD Authorized by: Maudie Flakes, MD   Critical care provider statement:    Critical care time (minutes):  32   Critical care was necessary to treat or prevent imminent or life-threatening deterioration of the following  conditions:  Metabolic crisis (Hyponatremia)   Critical care was time spent personally by me on the following activities:  Discussions with consultants, evaluation of patient's response to treatment, examination of patient, ordering and performing treatments and interventions, ordering and review of laboratory studies, ordering and review of radiographic studies, pulse oximetry, re-evaluation of patient's condition, obtaining history from patient or surrogate and review of old charts    ED Course and Medical Decision Making  I have reviewed the triage vital signs, the nursing notes, and pertinent available records from the EMR.  Listed above are laboratory and imaging tests that I personally ordered, reviewed, and interpreted and then considered in my medical decision making (see below for details).  Favoring hypovolemic hyponatremia, providing IV fluids, critically low and will need admission.       Barth Kirks. Sedonia Small, [place] [email or web address]  Final Clinical Impressions(s) / ED Diagnoses     ICD-10-CM   1. Diarrhea, unspecified type  R19.7   2. Hyponatremia  E87.1   3. Hypokalemia  E87.6     ED Discharge Orders    None       Discharge Instructions Discussed with and Provided to Patient:   Discharge Instructions   None       Maudie Flakes, MD 05/12/20 2049

## 2020-05-12 NOTE — ED Notes (Signed)
Medications not in stock at [facility], charge RN to facilitate delivery of medications by courier, pt updated

## 2020-05-12 NOTE — ED Notes (Signed)
Pt requesting medication for arthritis pain. States she takes Lyrica and Belbuca. Dr. Sedonia Small made aware.

## 2020-05-12 NOTE — ED Notes (Signed)
Brought pt some saltines, ginger ale and peanut butter crackers

## 2020-05-13 DIAGNOSIS — Z7982 Long term (current) use of aspirin: Secondary | ICD-10-CM | POA: Diagnosis not present

## 2020-05-13 DIAGNOSIS — Z888 Allergy status to other drugs, medicaments and biological substances status: Secondary | ICD-10-CM | POA: Diagnosis not present

## 2020-05-13 DIAGNOSIS — L4050 Arthropathic psoriasis, unspecified: Secondary | ICD-10-CM | POA: Diagnosis present

## 2020-05-13 DIAGNOSIS — Z20822 Contact with and (suspected) exposure to covid-19: Secondary | ICD-10-CM | POA: Diagnosis present

## 2020-05-13 DIAGNOSIS — R197 Diarrhea, unspecified: Secondary | ICD-10-CM | POA: Diagnosis present

## 2020-05-13 DIAGNOSIS — R112 Nausea with vomiting, unspecified: Secondary | ICD-10-CM | POA: Diagnosis present

## 2020-05-13 DIAGNOSIS — Z8673 Personal history of transient ischemic attack (TIA), and cerebral infarction without residual deficits: Secondary | ICD-10-CM | POA: Diagnosis not present

## 2020-05-13 DIAGNOSIS — I10 Essential (primary) hypertension: Secondary | ICD-10-CM | POA: Diagnosis present

## 2020-05-13 DIAGNOSIS — Z800 Family history of malignant neoplasm of digestive organs: Secondary | ICD-10-CM | POA: Diagnosis not present

## 2020-05-13 DIAGNOSIS — A084 Viral intestinal infection, unspecified: Secondary | ICD-10-CM | POA: Diagnosis present

## 2020-05-13 DIAGNOSIS — E872 Acidosis, unspecified: Secondary | ICD-10-CM | POA: Diagnosis present

## 2020-05-13 DIAGNOSIS — Z87891 Personal history of nicotine dependence: Secondary | ICD-10-CM | POA: Diagnosis not present

## 2020-05-13 DIAGNOSIS — E876 Hypokalemia: Secondary | ICD-10-CM | POA: Diagnosis present

## 2020-05-13 DIAGNOSIS — E860 Dehydration: Secondary | ICD-10-CM | POA: Diagnosis present

## 2020-05-13 DIAGNOSIS — G2581 Restless legs syndrome: Secondary | ICD-10-CM | POA: Diagnosis present

## 2020-05-13 DIAGNOSIS — T501X5A Adverse effect of loop [high-ceiling] diuretics, initial encounter: Secondary | ICD-10-CM | POA: Diagnosis present

## 2020-05-13 DIAGNOSIS — Z8249 Family history of ischemic heart disease and other diseases of the circulatory system: Secondary | ICD-10-CM | POA: Diagnosis not present

## 2020-05-13 DIAGNOSIS — F411 Generalized anxiety disorder: Secondary | ICD-10-CM | POA: Diagnosis present

## 2020-05-13 DIAGNOSIS — E785 Hyperlipidemia, unspecified: Secondary | ICD-10-CM | POA: Diagnosis present

## 2020-05-13 DIAGNOSIS — E861 Hypovolemia: Secondary | ICD-10-CM | POA: Diagnosis present

## 2020-05-13 DIAGNOSIS — X58XXXA Exposure to other specified factors, initial encounter: Secondary | ICD-10-CM | POA: Diagnosis present

## 2020-05-13 DIAGNOSIS — Z9049 Acquired absence of other specified parts of digestive tract: Secondary | ICD-10-CM | POA: Diagnosis not present

## 2020-05-13 DIAGNOSIS — I2510 Atherosclerotic heart disease of native coronary artery without angina pectoris: Secondary | ICD-10-CM | POA: Diagnosis present

## 2020-05-13 DIAGNOSIS — D509 Iron deficiency anemia, unspecified: Secondary | ICD-10-CM | POA: Diagnosis present

## 2020-05-13 DIAGNOSIS — E871 Hypo-osmolality and hyponatremia: Secondary | ICD-10-CM | POA: Diagnosis present

## 2020-05-13 DIAGNOSIS — Z96643 Presence of artificial hip joint, bilateral: Secondary | ICD-10-CM | POA: Diagnosis present

## 2020-05-13 DIAGNOSIS — Z79899 Other long term (current) drug therapy: Secondary | ICD-10-CM | POA: Diagnosis not present

## 2020-05-13 LAB — BASIC METABOLIC PANEL
Anion gap: 9 (ref 5–15)
Anion gap: 9 (ref 5–15)
Anion gap: 10 (ref 5–15)
BUN: 9 mg/dL (ref 6–20)
BUN: 9 mg/dL (ref 6–20)
BUN: 8 mg/dL (ref 6–20)
CO2: 25 mmol/L (ref 22–32)
CO2: 25 mmol/L (ref 22–32)
CO2: 26 mmol/L (ref 22–32)
Calcium: 8.5 mg/dL — ABNORMAL LOW (ref 8.9–10.3)
Calcium: 8.4 mg/dL — ABNORMAL LOW (ref 8.9–10.3)
Calcium: 9.1 mg/dL (ref 8.9–10.3)
Chloride: 90 mmol/L — ABNORMAL LOW (ref 98–111)
Chloride: 94 mmol/L — ABNORMAL LOW (ref 98–111)
Chloride: 96 mmol/L — ABNORMAL LOW (ref 98–111)
Creatinine, Ser: 0.71 mg/dL (ref 0.44–1.00)
Creatinine, Ser: 0.63 mg/dL (ref 0.44–1.00)
Creatinine, Ser: 0.65 mg/dL (ref 0.44–1.00)
GFR calc Af Amer: >60 mL/min (ref >60)
GFR calc Af Amer: >60 mL/min (ref >60)
GFR calc Af Amer: >60 mL/min (ref >60)
GFR calc non Af Amer: >60 mL/min (ref >60)
GFR calc non Af Amer: >60 mL/min (ref >60)
GFR calc non Af Amer: >60 mL/min (ref >60)
Glucose, Bld: 109 mg/dL — ABNORMAL HIGH (ref 70–99)
Glucose, Bld: 109 mg/dL — ABNORMAL HIGH (ref 70–99)
Glucose, Bld: 115 mg/dL — ABNORMAL HIGH (ref 70–99)
Potassium: 3.4 mmol/L — ABNORMAL LOW (ref 3.5–5.1)
Potassium: 3.3 mmol/L — ABNORMAL LOW (ref 3.5–5.1)
Potassium: 3.8 mmol/L (ref 3.5–5.1)
Sodium: 124 mmol/L — ABNORMAL LOW (ref 135–145)
Sodium: 128 mmol/L — ABNORMAL LOW (ref 135–145)
Sodium: 132 mmol/L — ABNORMAL LOW (ref 135–145)

## 2020-05-13 LAB — CBC WITH DIFFERENTIAL/PLATELET
Abs Immature Granulocytes: 0.11 10*3/uL — ABNORMAL HIGH (ref 0.00–0.07)
Basophils Absolute: 0.1 10*3/uL (ref 0.0–0.1)
Basophils Relative: 1 %
Eosinophils Absolute: 0.1 10*3/uL (ref 0.0–0.5)
Eosinophils Relative: 1 %
HCT: 43.1 % (ref 36.0–46.0)
Hemoglobin: 14 g/dL (ref 12.0–15.0)
Immature Granulocytes: 1 %
Lymphocytes Relative: 14 %
Lymphs Abs: 1.5 10*3/uL (ref 0.7–4.0)
MCH: 27.7 pg (ref 26.0–34.0)
MCHC: 32.5 g/dL (ref 30.0–36.0)
MCV: 85.2 fL (ref 80.0–100.0)
Monocytes Absolute: 0.9 10*3/uL (ref 0.1–1.0)
Monocytes Relative: 8 %
Neutro Abs: 8 10*3/uL — ABNORMAL HIGH (ref 1.7–7.7)
Neutrophils Relative %: 75 %
Platelets: 443 10*3/uL — ABNORMAL HIGH (ref 150–400)
RBC: 5.06 MIL/uL (ref 3.87–5.11)
RDW: 16 % — ABNORMAL HIGH (ref 11.5–15.5)
WBC: 10.7 10*3/uL — ABNORMAL HIGH (ref 4.0–10.5)
nRBC: 0 % (ref 0.0–0.2)

## 2020-05-13 LAB — COMPREHENSIVE METABOLIC PANEL
ALT: 18 U/L (ref 0–44)
AST: 17 U/L (ref 15–41)
Albumin: 4.4 g/dL (ref 3.5–5.0)
Alkaline Phosphatase: 108 U/L (ref 38–126)
Anion gap: 12 (ref 5–15)
BUN: 8 mg/dL (ref 6–20)
CO2: 25 mmol/L (ref 22–32)
Calcium: 9.1 mg/dL (ref 8.9–10.3)
Chloride: 97 mmol/L — ABNORMAL LOW (ref 98–111)
Creatinine, Ser: 0.59 mg/dL (ref 0.44–1.00)
GFR calc Af Amer: >60 mL/min (ref >60)
GFR calc non Af Amer: >60 mL/min (ref >60)
Glucose, Bld: 116 mg/dL — ABNORMAL HIGH (ref 70–99)
Potassium: 3.7 mmol/L (ref 3.5–5.1)
Sodium: 134 mmol/L — ABNORMAL LOW (ref 135–145)
Total Bilirubin: 1 mg/dL (ref 0.3–1.2)
Total Protein: 7.1 g/dL (ref 6.5–8.1)

## 2020-05-13 LAB — TSH: TSH: 1.404 u[IU]/mL (ref 0.350–4.500)

## 2020-05-13 LAB — OSMOLALITY: Osmolality: 260 mOsm/kg — ABNORMAL LOW (ref 275–295)

## 2020-05-13 LAB — SODIUM, URINE, RANDOM: Sodium, Ur: <10 mmol/L

## 2020-05-13 LAB — OSMOLALITY, URINE: Osmolality, Ur: 101 mOsm/kg — ABNORMAL LOW (ref 300–900)

## 2020-05-13 MED ORDER — DEXTROSE 5 % IV SOLN: INTRAVENOUS | Status: DC

## 2020-05-13 MED ORDER — ATORVASTATIN CALCIUM 40 MG PO TABS
40.0000 mg | ORAL_TABLET | Freq: Every day | ORAL | Status: DC
  Administered 2020-05-13 – 2020-05-14 (×2): 40 mg via ORAL
  Filled 2020-05-13 (×2): qty 1

## 2020-05-13 MED ORDER — ENOXAPARIN SODIUM 40 MG/0.4ML ~~LOC~~ SOLN
40.0000 mg | SUBCUTANEOUS | Status: DC
  Administered 2020-05-13: 40 mg via SUBCUTANEOUS
  Filled 2020-05-13: qty 0.4

## 2020-05-13 MED ORDER — ASPIRIN EC 325 MG PO TBEC
325.0000 mg | DELAYED_RELEASE_TABLET | Freq: Every day | ORAL | Status: DC
  Administered 2020-05-13 – 2020-05-14 (×2): 325 mg via ORAL
  Filled 2020-05-13 (×2): qty 1

## 2020-05-13 MED ORDER — LOSARTAN POTASSIUM 50 MG PO TABS
100.0000 mg | ORAL_TABLET | Freq: Every day | ORAL | Status: DC
  Administered 2020-05-13 – 2020-05-14 (×2): 100 mg via ORAL
  Filled 2020-05-13: qty 2
  Filled 2020-05-13: qty 4

## 2020-05-13 MED ORDER — TRAZODONE HCL 50 MG PO TABS
50.0000 mg | ORAL_TABLET | Freq: Every day | ORAL | Status: DC
  Administered 2020-05-13 (×2): 50 mg via ORAL
  Filled 2020-05-13 (×2): qty 1

## 2020-05-13 MED ORDER — ALPRAZOLAM 0.25 MG PO TABS
0.2500 mg | ORAL_TABLET | Freq: Two times a day (BID) | ORAL | Status: DC | PRN
  Administered 2020-05-13: 0.25 mg via ORAL
  Filled 2020-05-13: qty 1

## 2020-05-13 MED ORDER — HYDROCODONE-ACETAMINOPHEN 5-325 MG PO TABS
1.0000 | ORAL_TABLET | ORAL | Status: DC | PRN
  Administered 2020-05-13 – 2020-05-14 (×4): 2 via ORAL
  Filled 2020-05-13 (×4): qty 2

## 2020-05-13 MED ORDER — SODIUM CHLORIDE 0.45 % IV SOLN: INTRAVENOUS | Status: DC

## 2020-05-13 NOTE — Plan of Care (Signed)
Patient has not had a BM since 05/11/20; patient is toleration clear liquids and toleration crackers

## 2020-05-13 NOTE — ED Notes (Signed)
Called [facility] pharmacy for home meds lyrica to be sent via courier; [place] courier to pick up medications.

## 2020-05-13 NOTE — ED Notes (Signed)
Pt is resting comfortably, pulled her blanket up and dimmed lights again after my round.

## 2020-05-13 NOTE — H&P (Signed)
[name] [name] is an 49 y.o. female.   Chief Complaint: Abnormal labs and shortness of breath. HPI: The patient is a 49 yr old woman with multiple chronic medical problems. 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  The patient went to her PCP for diarrhea x 5 days with nausea and vomiting. She had labs drawn at that visit. Due to abnormal results she was instructed to present to the ED.  The patient states that she has had fevers and chills. She has had no further stools since she arrived at [facility].  In the ED she was found to have a sodium of 118, potassium of 2.7, chloride of 81, and CO2 of 21. She also had an elevated WBC count. She has had no further stools and no further episodes of emesis or nausea.   Her most recent labs prior to presentation at [facility] demonstrated correction of the sodium to 132. So increase of 14 mmol/L in 22 hours. Fresh labs have been collected.  [facility] were consulted to admit the patient for further evaluation and care.  Past Medical History:  Diagnosis Date  . Anxiety   . Blood transfusion without reported diagnosis   . Coronary artery disease   . Hyperlipidemia   . Hypertension   . Iron deficiency anemia 01/15/2019  . Left hip prosthetic joint infection ([place]): Hx of s/p revision fem head and linear exachange 05/19/2019 08/14/2019  . Psoriatic arthritis ([place])   . Stroke [facility]) 2014    Past Surgical History:  Procedure Laterality Date  . CARPAL TUNNEL RELEASE     B/L hand  . CHOLECYSTECTOMY    . FOOT SURGERY     with hardware   . HERNIA REPAIR    . KNEE ARTHROSCOPY Bilateral 08/2014   [facility] Dr. Len Childs  . TEE WITHOUT CARDIOVERSION  01/22/2012   Procedure: TRANSESOPHAGEAL ECHOCARDIOGRAM (TEE);  Surgeon: Lelon Perla, MD;  Location: [facility] ENDOSCOPY;  Service: Cardiovascular;   Laterality: N/A;  . TOTAL HIP ARTHROPLASTY Bilateral     Family History  Problem Relation Age of Onset  . Hypertension Mother   . Hypertension Father   . Colon cancer Maternal Grandfather   . Colon cancer Paternal Grandfather   . Colon polyps Neg Hx   . Esophageal cancer Neg Hx   . Rectal cancer Neg Hx   . Stomach cancer Neg Hx    Social History:  reports that she has quit smoking. She has never used smokeless tobacco. She reports current alcohol use. She reports that she does not use drugs. Medications Prior to Admission  Medication Sig Dispense Refill  . aspirin 325 MG EC tablet Take 325 mg by mouth daily.     Marland Kitchen atorvastatin (LIPITOR) 40 MG tablet Take 40 mg by mouth daily.    . Buprenorphine HCl (BELBUCA) 450 MCG FILM Place 450 mcg inside cheek in the morning and at bedtime.     Marland Kitchen escitalopram (LEXAPRO) 20 MG tablet Take 20 mg by mouth daily.    . furosemide (LASIX) 20 MG tablet Take 20 mg by mouth daily.    . hydrochlorothiazide (MICROZIDE) 12.5 MG capsule Take 12.5 mg by mouth daily.     Marland Kitchen losartan (COZAAR) 100 MG tablet Take 100 mg by mouth daily.    . potassium chloride SA (KLOR-CON) 20 MEQ tablet Take 40 mEq by mouth daily.     Marland Kitchen  pregabalin (LYRICA) 75 MG capsule Take 150 mg by mouth 3 (three) times daily.     Marland Kitchen tiZANidine (ZANAFLEX) 4 MG tablet Take 4 mg by mouth every 8 (eight) hours as needed for muscle spasms.     . traZODone (DESYREL) 50 MG tablet Take 50 mg by mouth at bedtime.      Allergies:  Allergies  Allergen Reactions  . Nsaids Other (See Comments)    stroke CVA   . Benzoin Dermatitis and Other (See Comments)    Localized - Skin Red with Burning Sensation Skin turns red and gets inflamed     Pertinent items noted in HPI and remainder of comprehensive ROS otherwise negative.  General appearance: alert, cooperative and no distress Head: Normocephalic, without obvious abnormality, atraumatic Eyes: conjunctivae/corneas clear. PERRL, EOM's intact. Fundi  benign. Throat: lips, mucosa, and tongue normal; teeth and gums normal Neck: no adenopathy, no carotid bruit, no JVD, supple, symmetrical, trachea midline and thyroid not enlarged, symmetric, no tenderness/mass/nodules Resp: No increased work of breathing. No wheezes, rales, or rhonchi. No tactile fremitus. Chest wall: no tenderness Cardio: regular rate and rhythm, S1, S2 normal, no murmur, click, rub or gallop GI: soft, non-tender, non-distended, mildly hyperactive bowel sounds. No organomegaly appreciated. Extremities: extremities normal, atraumatic, no cyanosis or edema Pulses: 2+ and symmetric Skin: Skin color, texture, turgor normal. No rashes or lesions Lymph nodes: Cervical, supraclavicular, and axillary nodes normal. Neurologic: Alert and oriented X 3, normal strength and tone. Normal symmetric reflexes. Normal coordination and gait  Results for orders placed or performed during the hospital encounter of 05/12/20 (from the past 48 hour(s))  CBC with Differential     Status: Abnormal   Collection Time: 05/12/20  6:16 PM  Result Value Ref Range   WBC 16.9 (H) 4.0 - 10.5 K/uL   RBC 4.97 3.87 - 5.11 MIL/uL   Hemoglobin 13.5 12.0 - 15.0 g/dL   HCT 39.8 36 - 46 %   MCV 80.1 80.0 - 100.0 fL   MCH 27.2 26.0 - 34.0 pg   MCHC 33.9 30.0 - 36.0 g/dL   RDW 15.3 11.5 - 15.5 %   Platelets 432 (H) 150 - 400 K/uL   nRBC 0.0 0.0 - 0.2 %   Neutrophils Relative % 80 %   Neutro Abs 13.7 (H) 1.7 - 7.7 K/uL   Lymphocytes Relative 10 %   Lymphs Abs 1.7 0.7 - 4.0 K/uL   Monocytes Relative 7 %   Monocytes Absolute 1.2 (H) 0 - 1 K/uL   Eosinophils Relative 1 %   Eosinophils Absolute 0.1 0 - 0 K/uL   Basophils Relative 1 %   Basophils Absolute 0.1 0 - 0 K/uL   Immature Granulocytes 1 %   Abs Immature Granulocytes 0.15 (H) 0.00 - 0.07 K/uL    Comment: Performed at [facility], [address] [place], [place] [address]  Comprehensive metabolic panel     Status: Abnormal   Collection  Time: 05/12/20  6:16 PM  Result Value Ref Range   Sodium 116 (LL) 135 - 145 mmol/L    Comment: CRITICAL RESULT CALLED TO, READ BACK BY AND VERIFIED WITH: SIMMS MARVA, RN @ 1903 ON 05/12/2020, [email or web address]    Potassium 2.7 (LL) 3.5 - 5.1 mmol/L    Comment: CRITICAL RESULT CALLED TO, READ BACK BY AND VERIFIED WITH: SIMMS MARVA, RN @ 1903 ON 05/12/2020, [email or web address].P    Chloride 81 (L) 98 - 111 mmol/L   CO2 21 (L) 22 -  32 mmol/L   Glucose, Bld 115 (H) 70 - 99 mg/dL    Comment: Glucose reference range applies only to samples taken after fasting for at least 8 hours.   BUN 10 6 - 20 mg/dL   Creatinine, Ser 0.85 0.44 - 1.00 mg/dL   Calcium 8.7 (L) 8.9 - 10.3 mg/dL   Total Protein 7.4 6.5 - 8.1 g/dL   Albumin 4.6 3.5 - 5.0 g/dL   AST 23 15 - 41 U/L   ALT 20 0 - 44 U/L   Alkaline Phosphatase 110 38 - 126 U/L   Total Bilirubin 1.1 0.3 - 1.2 mg/dL   GFR calc non Af Amer >60 >60 mL/min   GFR calc Af Amer >60 >60 mL/min   Anion gap 14 5 - 15    Comment: Performed at [facility], [address] [place], [place] [address]  SARS Coronavirus 2 by RT PCR (hospital order, performed in [facility] hospital lab) Nasopharyngeal Urine, Clean Catch     Status: None   Collection Time: 05/12/20  8:42 PM   Specimen: Urine, Clean Catch; Nasopharyngeal  Result Value Ref Range   SARS Coronavirus 2 NEGATIVE NEGATIVE    Comment: (NOTE) SARS-CoV-2 target nucleic acids are NOT DETECTED.  The SARS-CoV-2 RNA is generally detectable in upper and lower respiratory specimens during the acute phase of infection. The lowest concentration of SARS-CoV-2 viral copies this assay can detect is 250 copies / mL. A negative result does not preclude SARS-CoV-2 infection and should not be used as the sole basis for treatment or other patient management decisions.  A negative result may occur with improper specimen collection / handling, submission of specimen other than nasopharyngeal swab, presence of viral  mutation(s) within the areas targeted by this assay, and inadequate number of viral copies (<250 copies / mL). A negative result must be combined with clinical observations, patient history, and epidemiological information.  Fact Sheet for Patients:   StrictlyIdeas.no  Fact Sheet for Healthcare Providers: BankingDealers.co.za  This test is not yet approved or  cleared by the [place] FDA and has been authorized for detection and/or diagnosis of SARS-CoV-2 by FDA under an Emergency Use Authorization (EUA).  This EUA will remain in effect (meaning this test can be used) for the duration of the COVID-19 declaration under Section 564(b)(1) of the Act, 21 U.S.C. section 360bbb-3(b)(1), unless the authorization is terminated or revoked sooner.  Performed at [facility], [address] [place], [place] [address]   Urinalysis, Routine w reflex microscopic Urine, Clean Catch     Status: Abnormal   Collection Time: 05/12/20  8:42 PM  Result Value Ref Range   Color, Urine YELLOW YELLOW   APPearance CLEAR CLEAR   Specific Gravity, Urine <1.005 (L) 1.005 - 1.030   pH 6.0 5.0 - 8.0   Glucose, UA NEGATIVE NEGATIVE mg/dL   Hgb urine dipstick NEGATIVE NEGATIVE   Bilirubin Urine NEGATIVE NEGATIVE   Ketones, ur NEGATIVE NEGATIVE mg/dL   Protein, ur NEGATIVE NEGATIVE mg/dL   Nitrite NEGATIVE NEGATIVE   Leukocytes,Ua NEGATIVE NEGATIVE    Comment: Microscopic not done on urines with negative protein, blood, leukocytes, nitrite, or glucose < 500 mg/dL. Performed at [facility], [address] [place], [place] [address]   Pregnancy, urine     Status: None   Collection Time: 05/12/20  8:42 PM  Result Value Ref Range   Preg Test, Ur NEGATIVE NEGATIVE  Comment:        THE SENSITIVITY OF THIS METHODOLOGY IS >20 mIU/mL. Performed at [facility], [address] [place], [place] [address]   Sodium, urine,  random     Status: None   Collection Time: 05/12/20  8:42 PM  Result Value Ref Range   Sodium, Ur <10 mmol/L    Comment: Performed at [facility] [address], [place], [place] [address]  Osmolality, urine     Status: Abnormal   Collection Time: 05/12/20  8:42 PM  Result Value Ref Range   Osmolality, Ur 101 (L) 300 - 900 mOsm/kg    Comment: Performed at [facility] [address] [place], [place] [address]  Basic metabolic panel     Status: Abnormal   Collection Time: 05/12/20 10:29 PM  Result Value Ref Range   Sodium 124 (L) 135 - 145 mmol/L    Comment: DELTA CHECK NOTED   Potassium 2.8 (L) 3.5 - 5.1 mmol/L   Chloride 86 (L) 98 - 111 mmol/L   CO2 27 22 - 32 mmol/L   Glucose, Bld 108 (H) 70 - 99 mg/dL    Comment: Glucose reference range applies only to samples taken after fasting for at least 8 hours.   BUN 9 6 - 20 mg/dL   Creatinine, Ser 0.71 0.44 - 1.00 mg/dL   Calcium 8.9 8.9 - 10.3 mg/dL   GFR calc non Af Amer >60 >60 mL/min   GFR calc Af Amer >60 >60 mL/min   Anion gap 11 5 - 15    Comment: Performed at [facility], [address] [place], [place] [address]  TSH     Status: None   Collection Time: 05/12/20 10:29 PM  Result Value Ref Range   TSH 1.404 0.350 - 4.500 uIU/mL    Comment: Performed by a 3rd Generation assay with a functional sensitivity of <=0.01 uIU/mL. Performed at [facility], [place] [address] [place], [place] [address]   Magnesium     Status: None   Collection Time: 05/12/20 10:29 PM  Result Value Ref Range   Magnesium 2.0 1.7 - 2.4 mg/dL    Comment: Performed at [facility], [address] [place], [place] [address]  Osmolality     Status: Abnormal   Collection Time: 05/12/20 10:29 PM  Result Value Ref Range   Osmolality 260 (L) 275 - 295 mOsm/kg    Comment: Performed at [facility], [place] [address] [place], [place] [address]  Basic metabolic panel     Status: Abnormal   Collection Time:  05/13/20  2:46 AM  Result Value Ref Range   Sodium 124 (L) 135 - 145 mmol/L   Potassium 3.4 (L) 3.5 - 5.1 mmol/L    Comment: DELTA CHECK NOTED   Chloride 90 (L) 98 - 111 mmol/L   CO2 25 22 - 32 mmol/L   Glucose, Bld 109 (H) 70 - 99 mg/dL    Comment: Glucose reference range applies only to samples taken after fasting for at least 8 hours.   BUN 9 6 - 20 mg/dL   Creatinine, Ser 0.71 0.44 - 1.00 mg/dL   Calcium 8.5 (L) 8.9 - 10.3 mg/dL   GFR calc non Af Amer >60 >60 mL/min   GFR calc Af Amer >60 >60 mL/min   Anion gap 9 5 - 15    Comment: Performed at [facility], [address] [place], [place] [address]  Basic metabolic panel     Status: Abnormal   Collection Time: 05/13/20  6:02 AM  Result Value Ref Range   Sodium 128 (L) 135 - 145 mmol/L   Potassium 3.3 (L) 3.5 - 5.1 mmol/L   Chloride 94 (L) 98 - 111 mmol/L   CO2 25 22 - 32 mmol/L   Glucose, Bld 109 (H) 70 - 99 mg/dL    Comment: Glucose reference range applies only to samples taken after fasting for at least 8 hours.   BUN 9 6 - 20 mg/dL   Creatinine, Ser 0.63 0.44 - 1.00 mg/dL   Calcium 8.4 (L) 8.9 - 10.3 mg/dL   GFR calc non Af Amer >60 >60 mL/min   GFR calc Af Amer >60 >60 mL/min   Anion gap 9 5 - 15    Comment: Performed at [facility], [address] [place], [place] [address]  Basic metabolic panel     Status: Abnormal   Collection Time: 05/13/20  1:40 PM  Result Value Ref Range   Sodium 132 (L) 135 - 145 mmol/L   Potassium 3.8 3.5 - 5.1 mmol/L   Chloride 96 (L) 98 - 111 mmol/L   CO2 26 22 - 32 mmol/L   Glucose, Bld 115 (H) 70 - 99 mg/dL    Comment: Glucose reference range applies only to samples taken after fasting for at least 8 hours.   BUN 8 6 - 20 mg/dL   Creatinine, Ser 0.65 0.44 - 1.00 mg/dL   Calcium 9.1 8.9 - 10.3 mg/dL   GFR calc non Af Amer >60 >60 mL/min   GFR calc Af Amer >60 >60 mL/min   Anion gap 10 5 - 15    Comment: Performed at [facility], [address] [place], [place] [address]   @[id number] @  Blood pressure 135/89, pulse 74, temperature 98.3 F (36.8 C), temperature source Oral, resp. rate 18, height 5\' 3"  (1.6 m), weight 86.3 kg, last menstrual period 05/04/2020, SpO2 99 %.    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 This appears to have resolved at this time. The patient states that she was given phenergan and something to stop her diarrhea by her PCP. Should the patient have any further loose stools it will be sent to lab for GI pathogen panel.  Metabolic Acidosis: Due to GI losses of bicarbonate. Now resolved. Monitor.  Dehydration: Resolved.  Hyponatremia: Sodium upon presentation was 118. Sodium by most recent lab (1340) had corrected to 132 or 14 mmol/L over 16 hours. This is a little rapid. I will give the patient D5W and follow her sodium's carefully.  Hypokalemia: Upon presentation potassium was 2.8. It is now 3.8.  Leukocytosis: Monitor. Patient admits to fever and chills in the days leading up to her presentation. None now. No diarrhea/nausea/or vomiting either.   Iron deficiency anemia: Noted. The patient is on no iron supplementation at home. Will not start now due to recent GI upset. Monitor Hgb.  Anxiety: Will continue Lexapro and add an anxiolytic on an as needed basis.  Arthropathic psoriasis: Noted.   CAD: Continue ASA, Lipitor, and losartan. Will hold lasix  at this time due to volume issues.  Restless Leg Syndrome: Continue Lyrica  I have seen and examined this patient myself. I have spent 78 minutes in her evaluation and admission  DVT Prophylaxis: SCD's CODE STATUS: Full  Code Family Communication: None available Disposition:  Status is: Observation  The patient remains OBS appropriate and will d/c before 2 midnights.  Dispo: The patient is from: Home              Anticipated d/c is to: Home              Anticipated d/c date is: 1 day              Patient currently is not medically stable to d/c.  Severity of Illness: The appropriate patient status for this patient is OBSERVATION. Observation status is judged to be reasonable and necessary in order to provide the required intensity of service to ensure the patient's safety. The patient's presenting symptoms, physical exam findings, and initial radiographic and laboratory data in the context of their medical condition is felt to place them at decreased risk for further clinical deterioration. Furthermore, it is anticipated that the patient will be medically stable for discharge from the hospital within 2 midnights of admission. The following factors support the patient status of observation.   " The patient's presenting symptoms include diarrhea, nausea, vomiting. " The physical exam findings include Hyperactive bowel sounds. " The initial radiographic and laboratory data are Na 118, K 2.8, CO2 19, WBC 16.4.          Amaan Meyer 05/13/2020, 4:21 PM

## 2020-05-13 NOTE — ED Notes (Signed)
Pt sleeping, will wake to take meds in an hour.

## 2020-05-13 NOTE — ED Notes (Signed)
Pt is requesting additional home meds.  Updated her home med list for MD and alerted him of this pt request.  Pt has been ambulatory to the bathroom, additional ginger ale given.  Will continue to monitor, plan discussed with pt including next blood draw at 2a to recheck abnormal labs.

## 2020-05-13 NOTE — ED Notes (Addendum)
Spoke to [place] at [place] re: Lyrica. Pt did not receive dose last night and it is unable to be located in this department; they will send a dose with a courier at 1300. Pt notified.

## 2020-05-13 NOTE — ED Notes (Signed)
Attempt x 1 to obtain BMP, unable to obtain.

## 2020-05-13 NOTE — ED Notes (Signed)
Pt amb to [facility]

## 2020-05-14 DIAGNOSIS — L4050 Arthropathic psoriasis, unspecified: Secondary | ICD-10-CM

## 2020-05-14 DIAGNOSIS — I10 Essential (primary) hypertension: Secondary | ICD-10-CM | POA: Diagnosis not present

## 2020-05-14 DIAGNOSIS — E871 Hypo-osmolality and hyponatremia: Secondary | ICD-10-CM

## 2020-05-14 LAB — CBC
HCT: 41 % (ref 36.0–46.0)
Hemoglobin: 13.2 g/dL (ref 12.0–15.0)
MCH: 27.9 pg (ref 26.0–34.0)
MCHC: 32.2 g/dL (ref 30.0–36.0)
MCV: 86.7 fL (ref 80.0–100.0)
Platelets: 363 10*3/uL (ref 150–400)
RBC: 4.73 MIL/uL (ref 3.87–5.11)
RDW: 16.2 % — ABNORMAL HIGH (ref 11.5–15.5)
WBC: 8.7 10*3/uL (ref 4.0–10.5)
nRBC: 0 % (ref 0.0–0.2)

## 2020-05-14 LAB — COMPREHENSIVE METABOLIC PANEL
ALT: 15 U/L (ref 0–44)
AST: 15 U/L (ref 15–41)
Albumin: 3.6 g/dL (ref 3.5–5.0)
Alkaline Phosphatase: 97 U/L (ref 38–126)
Anion gap: 12 (ref 5–15)
BUN: 9 mg/dL (ref 6–20)
CO2: 25 mmol/L (ref 22–32)
Calcium: 8.5 mg/dL — ABNORMAL LOW (ref 8.9–10.3)
Chloride: 96 mmol/L — ABNORMAL LOW (ref 98–111)
Creatinine, Ser: 0.52 mg/dL (ref 0.44–1.00)
GFR calc Af Amer: >60 mL/min (ref >60)
GFR calc non Af Amer: >60 mL/min (ref >60)
Glucose, Bld: 119 mg/dL — ABNORMAL HIGH (ref 70–99)
Potassium: 3.3 mmol/L — ABNORMAL LOW (ref 3.5–5.1)
Sodium: 133 mmol/L — ABNORMAL LOW (ref 135–145)
Total Bilirubin: 0.7 mg/dL (ref 0.3–1.2)
Total Protein: 6.1 g/dL — ABNORMAL LOW (ref 6.5–8.1)

## 2020-05-14 MED ORDER — POTASSIUM CHLORIDE CRYS ER 20 MEQ PO TBCR
40.0000 meq | EXTENDED_RELEASE_TABLET | Freq: Once | ORAL | Status: AC
  Administered 2020-05-14: 40 meq via ORAL
  Filled 2020-05-14: qty 2

## 2020-05-14 MED ORDER — ONDANSETRON 4 MG PO TBDP: 4.0000 mg | ORAL_TABLET | Freq: Three times a day (TID) | ORAL | 0 refills | Status: DC | PRN

## 2020-05-14 MED ORDER — TRAMADOL HCL 50 MG PO TABS: 50.0000 mg | ORAL_TABLET | Freq: Four times a day (QID) | ORAL | 0 refills | Status: DC | PRN

## 2020-05-14 NOTE — Discharge Summary (Signed)
Physician Discharge Summary   Patient ID: [name] [name] MRN: [id number] DOB/AGE: Mar 27, 1971 49 y.o.  Admit date: 05/12/2020 Discharge date: 05/14/2020  Primary Care Physician:  Robyne Peers, MD   Recommendations for Outpatient Follow-up:  1. Follow up with PCP in 1-2 weeks 2. Please obtain BMP in one week  3. Discontinued Lasix, hydrochlorothiazide, potassium  Home Health: Currently at baseline Equipment/Devices:   Discharge Condition: stable CODE STATUS: FULL  Diet recommendation: Regular diet   Discharge Diagnoses:    . Acute viral gastroenteritis with nausea vomiting and diarrhea, resolved . Acute hypovolemic hyponatremia . Arthropathic psoriasis, unspecified ([place]) . Benign essential hypertension . Dehydration . Hypokalemia . Elevated WBC count . Generalized anxiety disorder . Iron deficiency anemia . Restless legs syndrome . Metabolic acidosis   Consults: None    Allergies:   Allergies  Allergen Reactions  . Nsaids Other (See Comments)    stroke CVA   . Benzoin Dermatitis and Other (See Comments)    Localized - Skin Red with Burning Sensation Skin turns red and gets inflamed      DISCHARGE MEDICATIONS: Allergies as of 05/14/2020      Reactions   Nsaids Other (See Comments)   stroke CVA    Benzoin Dermatitis, Other (See Comments)   Localized - Skin Red with Burning Sensation Skin turns red and gets inflamed      Medication List    STOP taking these medications   furosemide 20 MG tablet Commonly known as: LASIX   hydrochlorothiazide 12.5 MG capsule Commonly known as: MICROZIDE   potassium chloride SA 20 MEQ tablet Commonly known as: KLOR-CON     TAKE these medications   aspirin 325 MG EC tablet Take 325 mg by mouth daily.   atorvastatin 40 MG tablet Commonly known as: LIPITOR Take 40 mg by mouth daily.   Belbuca 450 MCG Film Generic drug: Buprenorphine HCl Place 450 mcg inside cheek in the morning and at bedtime.    escitalopram 20 MG tablet Commonly known as: LEXAPRO Take 20 mg by mouth daily.   losartan 100 MG tablet Commonly known as: COZAAR Take 100 mg by mouth daily.   ondansetron 4 MG disintegrating tablet Commonly known as: Zofran ODT Take 1 tablet (4 mg total) by mouth every 8 (eight) hours as needed for nausea or vomiting.   pregabalin 75 MG capsule Commonly known as: LYRICA Take 150 mg by mouth 3 (three) times daily.   tiZANidine 4 MG tablet Commonly known as: ZANAFLEX Take 4 mg by mouth every 8 (eight) hours as needed for muscle spasms.   traZODone 50 MG tablet Commonly known as: DESYREL Take 50 mg by mouth at bedtime.        Brief H and P: For complete details please refer to admission H and P, but in brief *The patient is a 49 yr old woman with multiple chronic medical problems. 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 The patient went to her PCP for diarrhea x 5 days with nausea and vomiting. She had labs drawn at that visit. Due to abnormal results she was instructed to present to the ED. The patient states that she has had fevers and chills. She has had no further stools since she arrived at [facility].  In the ED she was found to have a sodium of 118, potassium of 2.7, chloride of 81, and CO2 of  21. She also had an elevated WBC count. She has had no further stools and no further episodes of emesis or nausea. [facility] Course:  Acute viral gastroenteritis with nausea vomiting and diarrhea. -Patient presented with 5 days of diarrhea and two episodes of emesis which was resolving at the time of admission -Patient was placed on IV fluid hydration, antiemetics, clear liquid diet -C. difficile was negative, GI pathogen panel negative -Now tolerating solid diet without any difficulty, cleared to be discharged  home.   Metabolic acidosis with dehydration -Due to GI losses, resolved  Acute hypovolemic hyponatremia -Sodium 116  at the time of admission with potassium 2.7 -Patient was placed on IV fluid hydration, hydrochlorothiazide, Lasix held -Serum osmolality 260, urine osmolality 101, urine sodium less than 10 -Sodium 133 at the time of discharge, tolerating diet  Hypokalemia -Likely due to Lasix and HCTZ prior to admission, both of which were discontinued -Lasix was replaced, potassium 3.3, will be replaced prior to discharge.  Leukocytosis -Likely stress demargination, work-up negative, WBCs 8.7 at the time of discharge -Further nausea vomiting or diarrhea.    Iron deficiency anemia: Noted. The patient is on no iron supplementation at home. Will not start now due to recent GI upset.  -Monitor H&H outpatient, and follow-up with PCP  Anxiety: Continue Lexapro  Arthropathic psoriasis: Noted.   CAD: -Continue aspirin, losartan, statin  Restless Leg Syndrome: Continue Lyrica   Day of Discharge S: No acute complaints, no further nausea vomiting diarrhea. Tolerating diet  BP 128/84 (BP Location: Left Arm)   Pulse 66   Temp 99.2 F (37.3 C) (Oral)   Resp 19   Ht 5\' 3"  (1.6 m)   Wt 86.3 kg   LMP 05/04/2020   SpO2 100%   BMI 33.70 kg/m   Physical Exam: General: Alert and awake oriented x3 not in any acute distress. HEENT: anicteric sclera, pupils reactive to light and accommodation CVS: S1-S2 clear no murmur rubs or gallops Chest: clear to auscultation bilaterally, no wheezing rales or rhonchi Abdomen: soft nontender, nondistended, normal bowel sounds Extremities: no cyanosis, clubbing or edema noted bilaterally Neuro: Cranial nerves II-XII intact, no focal neurological deficits    Get Medicines reviewed and adjusted: Please take all your medications with you for your next visit with your Primary MD  Please request your Primary MD to go over all hospital  tests and procedure/radiological results at the follow up. Please ask your Primary MD to get all Hospital records sent to his/her office.  If you experience worsening of your admission symptoms, develop shortness of breath, life threatening emergency, suicidal or homicidal thoughts you must seek medical attention immediately by calling 911 or calling your MD immediately  if symptoms less severe.  You must read complete instructions/literature along with all the possible adverse reactions/side effects for all the Medicines you take and that have been prescribed to you. Take any new Medicines after you have completely understood and accept all the possible adverse reactions/side effects.   Do not drive when taking pain medications.   Do not take more than prescribed Pain, Sleep and Anxiety Medications  Special Instructions: If you have smoked or chewed Tobacco  in the last 2 yrs please stop smoking, stop any regular Alcohol  and or any Recreational drug use.  Wear Seat belts while driving.  Please note  You were cared for by a hospitalist during your hospital stay. Once you are discharged, your primary care physician will handle any further medical issues.  Please note that NO REFILLS for any discharge medications will be authorized once you are discharged, as it is imperative that you return to your primary care physician (or establish a relationship with a primary care physician if you do not have one) for your aftercare needs so that they can reassess your need for medications and monitor your lab values.   The results of significant diagnostics from this hospitalization (including imaging, microbiology, ancillary and laboratory) are listed below for reference.      Procedures/Studies:   No results found.    LAB RESULTS: Basic Metabolic Panel: Recent Labs  Lab 05/12/20 2229 05/13/20 0246 05/13/20 1603 05/14/20 0558  NA 124*   < > 134* 133*  K 2.8*   < > 3.7 3.3*  CL 86*   < >  97* 96*  CO2 27   < > 25 25  GLUCOSE 108*   < > 116* 119*  BUN 9   < > 8 9  CREATININE 0.71   < > 0.59 0.52  CALCIUM 8.9   < > 9.1 8.5*  MG 2.0  --   --   --    < > = values in this interval not displayed.   Liver Function Tests: Recent Labs  Lab 05/13/20 1603 05/14/20 0558  AST 17 15  ALT 18 15  ALKPHOS 108 97  BILITOT 1.0 0.7  PROT 7.1 6.1*  ALBUMIN 4.4 3.6   No results for input(s): LIPASE, AMYLASE in the last 168 hours. No results for input(s): AMMONIA in the last 168 hours. CBC: Recent Labs  Lab 05/13/20 1603 05/13/20 1603 05/14/20 0558  WBC 10.7*  --  8.7  NEUTROABS 8.0*  --   --   HGB 14.0  --  13.2  HCT 43.1  --  41.0  MCV 85.2   < > 86.7  PLT 443*  --  363   < > = values in this interval not displayed.   Cardiac Enzymes: No results for input(s): CKTOTAL, CKMB, CKMBINDEX, TROPONINI in the last 168 hours. BNP: Invalid input(s): POCBNP CBG: No results for input(s): GLUCAP in the last 168 hours.     Disposition and Follow-up: Discharge Instructions    Diet general   Complete by: As directed    Increase activity slowly   Complete by: As directed        DISPOSITION: home    DISCHARGE FOLLOW-UP  Follow-up Information    Robyne Peers, MD. Schedule an appointment as soon as possible for a visit in 1 week(s).   Specialty: Family Medicine Why: please check labs (metabolic panel) Contact information: [address] [place] [address] [phone]                Time coordinating discharge:  54mins   Signed:   Estill Cotta M.D. [facility] 05/14/2020, 10:51 AM

## 2020-05-29 ENCOUNTER — Encounter (HOSPITAL_COMMUNITY): Payer: Self-pay | Admitting: Emergency Medicine

## 2020-05-29 ENCOUNTER — Inpatient Hospital Stay (HOSPITAL_COMMUNITY)
Admit: 2020-05-29 | Discharge: 2020-06-02 | DRG: 641 | Disposition: A | Discharge status: Home / Self Care | Payer: BC Managed Care – PPO | Attending: Internal Medicine | Admitting: Internal Medicine

## 2020-05-29 ENCOUNTER — Observation Stay (HOSPITAL_COMMUNITY): Payer: BC Managed Care – PPO

## 2020-05-29 ENCOUNTER — Emergency Department (HOSPITAL_COMMUNITY): Payer: BC Managed Care – PPO

## 2020-05-29 ENCOUNTER — Other Ambulatory Visit: Payer: Self-pay

## 2020-05-29 DIAGNOSIS — R9431 Abnormal electrocardiogram [ECG] [EKG]: Secondary | ICD-10-CM | POA: Diagnosis not present

## 2020-05-29 DIAGNOSIS — I2510 Atherosclerotic heart disease of native coronary artery without angina pectoris: Secondary | ICD-10-CM | POA: Diagnosis present

## 2020-05-29 DIAGNOSIS — Z79891 Long term (current) use of opiate analgesic: Secondary | ICD-10-CM

## 2020-05-29 DIAGNOSIS — Z8673 Personal history of transient ischemic attack (TIA), and cerebral infarction without residual deficits: Secondary | ICD-10-CM

## 2020-05-29 DIAGNOSIS — E860 Dehydration: Principal | ICD-10-CM | POA: Diagnosis present

## 2020-05-29 DIAGNOSIS — Z9049 Acquired absence of other specified parts of digestive tract: Secondary | ICD-10-CM

## 2020-05-29 DIAGNOSIS — L4050 Arthropathic psoriasis, unspecified: Secondary | ICD-10-CM | POA: Diagnosis present

## 2020-05-29 DIAGNOSIS — Z20822 Contact with and (suspected) exposure to covid-19: Secondary | ICD-10-CM | POA: Diagnosis present

## 2020-05-29 DIAGNOSIS — E876 Hypokalemia: Secondary | ICD-10-CM | POA: Diagnosis present

## 2020-05-29 DIAGNOSIS — K529 Noninfective gastroenteritis and colitis, unspecified: Secondary | ICD-10-CM | POA: Diagnosis present

## 2020-05-29 DIAGNOSIS — Z6834 Body mass index (BMI) 34.0-34.9, adult: Secondary | ICD-10-CM

## 2020-05-29 DIAGNOSIS — Z87891 Personal history of nicotine dependence: Secondary | ICD-10-CM

## 2020-05-29 DIAGNOSIS — R0789 Other chest pain: Secondary | ICD-10-CM | POA: Diagnosis present

## 2020-05-29 DIAGNOSIS — Z7982 Long term (current) use of aspirin: Secondary | ICD-10-CM

## 2020-05-29 DIAGNOSIS — Z886 Allergy status to analgesic agent status: Secondary | ICD-10-CM

## 2020-05-29 DIAGNOSIS — N179 Acute kidney failure, unspecified: Secondary | ICD-10-CM

## 2020-05-29 DIAGNOSIS — E782 Mixed hyperlipidemia: Secondary | ICD-10-CM | POA: Diagnosis present

## 2020-05-29 DIAGNOSIS — Z8249 Family history of ischemic heart disease and other diseases of the circulatory system: Secondary | ICD-10-CM

## 2020-05-29 DIAGNOSIS — E669 Obesity, unspecified: Secondary | ICD-10-CM | POA: Diagnosis present

## 2020-05-29 DIAGNOSIS — F411 Generalized anxiety disorder: Secondary | ICD-10-CM | POA: Diagnosis present

## 2020-05-29 DIAGNOSIS — I10 Essential (primary) hypertension: Secondary | ICD-10-CM | POA: Diagnosis present

## 2020-05-29 DIAGNOSIS — E785 Hyperlipidemia, unspecified: Secondary | ICD-10-CM | POA: Diagnosis present

## 2020-05-29 DIAGNOSIS — Z96643 Presence of artificial hip joint, bilateral: Secondary | ICD-10-CM | POA: Diagnosis present

## 2020-05-29 DIAGNOSIS — R112 Nausea with vomiting, unspecified: Secondary | ICD-10-CM

## 2020-05-29 DIAGNOSIS — G2581 Restless legs syndrome: Secondary | ICD-10-CM | POA: Diagnosis present

## 2020-05-29 DIAGNOSIS — Z79899 Other long term (current) drug therapy: Secondary | ICD-10-CM

## 2020-05-29 DIAGNOSIS — Z888 Allergy status to other drugs, medicaments and biological substances status: Secondary | ICD-10-CM

## 2020-05-29 DIAGNOSIS — D509 Iron deficiency anemia, unspecified: Secondary | ICD-10-CM | POA: Diagnosis present

## 2020-05-29 LAB — LIPASE, BLOOD: Lipase: 24 U/L (ref 11–51)

## 2020-05-29 LAB — BASIC METABOLIC PANEL
Anion gap: 20 — ABNORMAL HIGH (ref 5–15)
Anion gap: 18 — ABNORMAL HIGH (ref 5–15)
BUN: 19 mg/dL (ref 6–20)
BUN: 18 mg/dL (ref 6–20)
CO2: 28 mmol/L (ref 22–32)
CO2: 30 mmol/L (ref 22–32)
Calcium: 10.5 mg/dL — ABNORMAL HIGH (ref 8.9–10.3)
Calcium: 9.4 mg/dL (ref 8.9–10.3)
Chloride: 86 mmol/L — ABNORMAL LOW (ref 98–111)
Chloride: 89 mmol/L — ABNORMAL LOW (ref 98–111)
Creatinine, Ser: 1.67 mg/dL — ABNORMAL HIGH (ref 0.44–1.00)
Creatinine, Ser: 1.61 mg/dL — ABNORMAL HIGH (ref 0.44–1.00)
GFR calc Af Amer: 42 mL/min — ABNORMAL LOW (ref >60)
GFR calc Af Amer: 43 mL/min — ABNORMAL LOW (ref >60)
GFR calc non Af Amer: 36 mL/min — ABNORMAL LOW (ref >60)
GFR calc non Af Amer: 37 mL/min — ABNORMAL LOW (ref >60)
Glucose, Bld: 140 mg/dL — ABNORMAL HIGH (ref 70–99)
Glucose, Bld: 120 mg/dL — ABNORMAL HIGH (ref 70–99)
Potassium: 2.7 mmol/L — CL (ref 3.5–5.1)
Potassium: 2.7 mmol/L — CL (ref 3.5–5.1)
Sodium: 134 mmol/L — ABNORMAL LOW (ref 135–145)
Sodium: 137 mmol/L (ref 135–145)

## 2020-05-29 LAB — HEPATIC FUNCTION PANEL
ALT: 25 U/L (ref 0–44)
AST: 32 U/L (ref 15–41)
Albumin: 4.5 g/dL (ref 3.5–5.0)
Alkaline Phosphatase: 107 U/L (ref 38–126)
Bilirubin, Direct: 0.3 mg/dL — ABNORMAL HIGH (ref 0.0–0.2)
Indirect Bilirubin: 0.8 mg/dL (ref 0.3–0.9)
Total Bilirubin: 1.1 mg/dL (ref 0.3–1.2)
Total Protein: 8 g/dL (ref 6.5–8.1)

## 2020-05-29 LAB — CBC
HCT: 46.3 % — ABNORMAL HIGH (ref 36.0–46.0)
Hemoglobin: 15.2 g/dL — ABNORMAL HIGH (ref 12.0–15.0)
MCH: 27.2 pg (ref 26.0–34.0)
MCHC: 32.8 g/dL (ref 30.0–36.0)
MCV: 83 fL (ref 80.0–100.0)
Platelets: 445 10*3/uL — ABNORMAL HIGH (ref 150–400)
RBC: 5.58 MIL/uL — ABNORMAL HIGH (ref 3.87–5.11)
RDW: 17 % — ABNORMAL HIGH (ref 11.5–15.5)
WBC: 12.9 10*3/uL — ABNORMAL HIGH (ref 4.0–10.5)
nRBC: 0 % (ref 0.0–0.2)

## 2020-05-29 LAB — RESPIRATORY PANEL BY RT PCR (FLU A&B, COVID)
Influenza A by PCR: NEGATIVE
Influenza B by PCR: NEGATIVE
SARS Coronavirus 2 by RT PCR: NEGATIVE

## 2020-05-29 LAB — PHOSPHORUS: Phosphorus: 5 mg/dL — ABNORMAL HIGH (ref 2.5–4.6)

## 2020-05-29 LAB — TROPONIN I (HIGH SENSITIVITY)
Troponin I (High Sensitivity): 11 ng/L (ref <18)
Troponin I (High Sensitivity): 13 ng/L (ref <18)

## 2020-05-29 LAB — PROCALCITONIN: Procalcitonin: <0.1 ng/mL

## 2020-05-29 LAB — I-STAT BETA HCG BLOOD, ED (MC, WL, AP ONLY): I-stat hCG, quantitative: <5 m[IU]/mL (ref <5)

## 2020-05-29 LAB — MAGNESIUM: Magnesium: 2.4 mg/dL (ref 1.7–2.4)

## 2020-05-29 MED ORDER — LORAZEPAM 2 MG/ML IJ SOLN
1.0000 mg | Freq: Once | INTRAMUSCULAR | Status: AC
  Administered 2020-05-29: 1 mg via INTRAVENOUS
  Filled 2020-05-29: qty 1

## 2020-05-29 MED ORDER — TRIMETHOBENZAMIDE HCL 100 MG/ML IM SOLN
200.0000 mg | Freq: Once | INTRAMUSCULAR | Status: AC
  Administered 2020-05-29: 200 mg via INTRAMUSCULAR
  Filled 2020-05-29: qty 2

## 2020-05-29 MED ORDER — ENOXAPARIN SODIUM 40 MG/0.4ML ~~LOC~~ SOLN
40.0000 mg | SUBCUTANEOUS | Status: DC
  Administered 2020-05-29 – 2020-06-01 (×4): 40 mg via SUBCUTANEOUS
  Filled 2020-05-29 (×4): qty 0.4

## 2020-05-29 MED ORDER — IOHEXOL 9 MG/ML PO SOLN
500.0000 mL | ORAL | Status: AC
  Administered 2020-05-29 (×2): 500 mL via ORAL

## 2020-05-29 MED ORDER — POTASSIUM CHLORIDE CRYS ER 20 MEQ PO TBCR
40.0000 meq | EXTENDED_RELEASE_TABLET | Freq: Once | ORAL | Status: AC
  Administered 2020-05-29: 40 meq via ORAL
  Filled 2020-05-29: qty 2

## 2020-05-29 MED ORDER — PROMETHAZINE HCL 25 MG/ML IJ SOLN: 25.0000 mg | Freq: Once | INTRAMUSCULAR | Status: DC

## 2020-05-29 MED ORDER — POTASSIUM CHLORIDE 10 MEQ/100ML IV SOLN
10.0000 meq | Freq: Once | INTRAVENOUS | Status: AC
  Administered 2020-05-29: 10 meq via INTRAVENOUS
  Filled 2020-05-29: qty 100

## 2020-05-29 MED ORDER — SODIUM CHLORIDE 0.9 % IV BOLUS
1000.0000 mL | Freq: Once | INTRAVENOUS | Status: AC
  Administered 2020-05-29: 1000 mL via INTRAVENOUS

## 2020-05-29 MED ORDER — MAGNESIUM SULFATE 2 GM/50ML IV SOLN
2.0000 g | Freq: Once | INTRAVENOUS | Status: AC
  Administered 2020-05-29: 2 g via INTRAVENOUS
  Filled 2020-05-29: qty 50

## 2020-05-29 MED ORDER — ACETAMINOPHEN 325 MG PO TABS: 650.0000 mg | ORAL_TABLET | Freq: Four times a day (QID) | ORAL | Status: DC | PRN

## 2020-05-29 MED ORDER — PROMETHAZINE HCL 25 MG/ML IJ SOLN: 6.2500 mg | Freq: Four times a day (QID) | INTRAMUSCULAR | Status: DC | PRN

## 2020-05-29 MED ORDER — ONDANSETRON HCL 4 MG/2ML IJ SOLN
4.0000 mg | Freq: Three times a day (TID) | INTRAMUSCULAR | Status: DC | PRN
  Administered 2020-05-29 – 2020-06-01 (×7): 4 mg via INTRAVENOUS
  Filled 2020-05-29 (×8): qty 2

## 2020-05-29 MED ORDER — ACETAMINOPHEN 650 MG RE SUPP: 650.0000 mg | Freq: Four times a day (QID) | RECTAL | Status: DC | PRN

## 2020-05-29 MED ORDER — POTASSIUM CHLORIDE 10 MEQ/100ML IV SOLN
10.0000 meq | INTRAVENOUS | Status: AC
  Administered 2020-05-29 (×2): 10 meq via INTRAVENOUS
  Filled 2020-05-29 (×3): qty 100

## 2020-05-29 MED ORDER — TRIMETHOBENZAMIDE HCL 300 MG PO CAPS
300.0000 mg | ORAL_CAPSULE | Freq: Three times a day (TID) | ORAL | Status: DC
  Administered 2020-05-29 – 2020-06-02 (×12): 300 mg via ORAL
  Filled 2020-05-29 (×13): qty 1

## 2020-05-29 MED ORDER — LORAZEPAM 2 MG/ML IJ SOLN
0.5000 mg | Freq: Once | INTRAMUSCULAR | Status: AC
  Administered 2020-05-29: 0.5 mg via INTRAVENOUS
  Filled 2020-05-29: qty 1

## 2020-05-29 MED ORDER — SODIUM CHLORIDE 0.9 % IV SOLN: INTRAVENOUS | Status: DC

## 2020-05-29 NOTE — ED Provider Notes (Signed)
Pt presents with CP and anxiety - sharp and stabbing - has had n/v/d for a couple of days - on exam has minimal abd ttp - no tachycardia.  Labs show low K, unsurprising given diarrhe and hx of hypo K on supplements.  EKG unremarkable.  Has AKI and low K - fluid and replace - recheck labs - trop pending - EKG non ischemic.  Will d/w hospitalist who will admit.  Multiple meds and pt didn't improve - has prolonged QT, no arrhythmias.  Medical screening examination/treatment/procedure(s) were conducted as a shared visit with non-physician practitioner(s) and myself.  I personally evaluated the patient during the encounter.  Clinical Impression:   Final diagnoses:  Hypokalemia  Prolonged QT interval  Nausea and vomiting, intractability of vomiting not specified, unspecified vomiting type    EKG Interpretation  Date/Time:  Sunday May 29 2020 08:35:02 EDT Ventricular Rate:  110 PR Interval:  124 QRS Duration: 86 QT Interval:  370 QTC Calculation: 500 R Axis:   39 Text Interpretation: Sinus tachycardia Nonspecific ST abnormality Abnormal ECG QT borderling prolonged Confirmed by Noemi Chapel [phone]) on 05/29/2020 10:25:30 AM          Noemi Chapel, MD 06/04/20 [phone]

## 2020-05-29 NOTE — ED Triage Notes (Signed)
Pt to triage via [facility] from home.  Reports stabbing pain to center of chest, SOB, and nausea x 1 hour.  Pt hyperventilating. 1 NTG and ASA 324mg  given PTA.  20g L AC.

## 2020-05-29 NOTE — ED Notes (Signed)
Pt still nauseous despite receiving Tigan. Provider notified.

## 2020-05-29 NOTE — ED Provider Notes (Signed)
[facility] EMERGENCY DEPARTMENT Provider Note   CSN: [id number] Arrival date & time: 05/29/20  4562     History Chief Complaint  Patient presents with  . Chest Pain    [name] [name] is a 49 y.o. female presents via EMS after she woke up with central stabbing chest pain, shortness of breath.  Chest pain does not radiate to the arm or the neck, she is not had any palpitations.  She has not recently traveled, had any recent surgeries, she is not on OCPs. She was administered one nitroglycerin and aspirin 324 mg by EMS prior to arrival.   States she has had severe nausea, non-bilious vomiting, and diarrhea for the last 2 days.  She denies hematemesis, hematochezia, melena. Primary concern is intractable nausea for the 2 days, associated with vomiting and concurrent diarrhea. At the time of my initial interview with her, she is lying in the bed rolling around, apparently unable to get comfortable because of her nausea. Patient is also obviously anxious.    I personally reviewed her medical record.  She has a history of hypokalemia, anxiety, coronary artery disease, and she is treated for hyperlipidemia and hypertension. Hx of remote cholecystectomy.  She was recently admitted 05/14/2020 to this hospital for hyponatremia, and hypokalemia secondary to several days of diarrhea.     HPI     Past Medical History:  Diagnosis Date  . Anxiety   . Blood transfusion without reported diagnosis   . Coronary artery disease   . Hyperlipidemia   . Hypertension   . Iron deficiency anemia 01/15/2019  . 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 Psoriatic arthritis ([place])   . Stroke [facility]) 2014    Patient Active Problem List   Diagnosis Date Noted  . Diarrhea 05/13/2020  . Metabolic acidosis 56/38/9373  . Hyponatremia 05/12/2020  . Intractable nausea and vomiting 01/24/2020  . Prolonged QT interval  01/24/2020  . Chronic pain 01/24/2020  . Nausea and vomiting 11/20/2019  . Syncope 11/20/2019  . Dehydration 11/20/2019  . Transaminitis 11/20/2019  . Lactic acidosis 11/20/2019  . Acute hyponatremia 11/20/2019  . AKI (acute kidney injury) ([place]) 11/19/2019  . Bone spur of right foot 09/01/2019  . Pain from implanted hardware 09/01/2019  . Acquired pes planovalgus of right foot 09/01/2019  . Person under investigation for COVID-19   . Acute respiratory failure with hypoxia ([place]) 08/12/2019  . COVID-19 virus infection: Presumed 08/12/2019  . Community acquired pneumonia 08/11/2019  . Multifocal pneumonia 08/10/2019  . Iron deficiency anemia 01/15/2019  . B12 deficiency 01/01/2019  . Immunosuppressed status ([place]) 12/31/2018  . Myoclonic jerking 12/29/2018  . Generalized anxiety disorder 08/04/2018  . Nontraumatic complete tear of right rotator cuff 07/08/2018  . Obesity with serious comorbidity 01/17/2018  . Normochromic normocytic anemia 12/24/2017  . 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 Right leg swelling 02/21/2017  . Asthmatic bronchitis with acute exacerbation 02/15/2017  . Noncompliance with treatment plan 12/27/2016  . Synovitis of right foot 12/04/2016  . Ganglion of foot, right 11/09/2016  . Hypokalemia 10/12/2016  . Hyperglycemia 10/11/2016  . IFG (impaired fasting glucose) 10/11/2016  . Lumbar trigger point syndrome 03/13/2016  . Pap smear for cervical cancer screening 02/17/2016  . Elevated liver enzymes 01/27/2016  . Long term (current) use of antibiotics 01/27/2016  . Cellulitis of left lower limb  01/03/2016  . Wound dehiscence 01/03/2016  . Loose total hip arthroplasty ([place]) 12/20/2015  . Foot pain, right 09/09/2015  . Screening for breast cancer 02/16/2015  . Left axillary hidradenitis 02/16/2015  . Epidermal inclusion cyst 01/11/2015  . Urticaria 06/22/2014  . Abnormal finding of  blood chemistry 02/23/2014  . Benign essential hypertension 02/23/2014  . Brain neoplasm ([place]) 02/23/2014  . 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 Edema 02/23/2014  . Elevated blood-pressure reading without diagnosis of hypertension 02/23/2014  . Elevated WBC count 02/23/2014  . Hearing loss of both ears 02/23/2014  . High risk medication use 02/23/2014  . Hip joint painful on movement 02/23/2014  . Mixed hyperlipidemia 02/23/2014  . Dislocation of right patella 02/23/2014  . Irritable bowel syndrome 02/23/2014  . Primary osteoarthritis involving multiple joints 02/23/2014  . Sudden idiopathic hearing loss 02/23/2014  . Primary osteoarthritis of left knee 02/23/2014  . Primary osteoarthritis of right knee 02/23/2014  . Osteoarthritis 02/23/2014  . Internal derangement of left knee 02/23/2014  . Pain in joint, pelvic region and thigh 02/23/2014  . Pain in joint, ankle and foot 02/23/2014  . Coccydynia 02/23/2014  . Right-sided low back pain with sciatica 02/19/2014  . 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 Fall at home 10/04/2012  . Acute ischemic stroke ([place]) 01/19/2012  . Hypertension 01/19/2012  . Anxiety 01/19/2012  . Chronic knee pain 01/19/2012  . Restless legs syndrome 09/05/2010  . HYPERSOMNIA 06/04/2010    Past Surgical History:  Procedure Laterality Date  . CARPAL TUNNEL RELEASE     B/L hand  . CHOLECYSTECTOMY    . FOOT SURGERY     with hardware   . HERNIA REPAIR    . KNEE ARTHROSCOPY Bilateral 08/2014   [facility] Dr. Len Childs  . TEE WITHOUT CARDIOVERSION  01/22/2012   Procedure: TRANSESOPHAGEAL ECHOCARDIOGRAM (TEE);  Surgeon: Lelon Perla, MD;  Location: [facility] ENDOSCOPY;  Service: Cardiovascular;  Laterality: N/A;  . TOTAL HIP ARTHROPLASTY Bilateral      OB History   No obstetric history on file.     Family History  Problem Relation Age of Onset  . Hypertension Mother   . Hypertension Father   . Colon  cancer Maternal Grandfather   . Colon cancer Paternal Grandfather   . Colon polyps Neg Hx   . Esophageal cancer Neg Hx   . Rectal cancer Neg Hx   . Stomach cancer Neg Hx     Social History   Tobacco Use  . Smoking status: Former Research scientist (life sciences)  . Smokeless tobacco: Never Used  . Tobacco comment: quit 10 years ago, smoked intermittently for few years. Less than 10 yrs total.  Vaping Use  . Vaping Use: Never used  Substance Use Topics  . Alcohol use: Yes    Comment: occ- 1-2 drinks a week  . Drug use: No    Home Medications Prior to Admission medications   Medication Sig Start Date End Date Taking? Authorizing Provider  aspirin 325 MG EC tablet Take 325 mg by mouth daily.     [provider]  atorvastatin (LIPITOR) 40 MG tablet Take 40 mg by mouth daily. 07/06/19   [provider]  Buprenorphine HCl (BELBUCA) 450 MCG FILM Place 450 mcg inside cheek in the morning and at bedtime.  09/04/19   [provider]  escitalopram (LEXAPRO) 20 MG tablet Take 20 mg by mouth daily.    [provider]  losartan (COZAAR) 100 MG  tablet Take 100 mg by mouth daily. 12/21/19   [provider]  ondansetron (ZOFRAN ODT) 4 MG disintegrating tablet Take 1 tablet (4 mg total) by mouth every 8 (eight) hours as needed for nausea or vomiting. 05/14/20   Rai, Ripudeep K, MD  pregabalin (LYRICA) 75 MG capsule Take 150 mg by mouth 3 (three) times daily.     [provider]  tiZANidine (ZANAFLEX) 4 MG tablet Take 4 mg by mouth every 8 (eight) hours as needed for muscle spasms.  04/22/20   [provider]  traMADol (ULTRAM) 50 MG tablet Take 1 tablet (50 mg total) by mouth every 6 (six) hours as needed for severe pain. 05/14/20 05/14/21  Rai, Vernelle Emerald, MD  traZODone (DESYREL) 50 MG tablet Take 50 mg by mouth at bedtime.    [provider]    Allergies    Nsaids and Benzoin  Review of Systems   Review of Systems  Constitutional: Positive for appetite  change and fatigue. Negative for chills and fever.  HENT: Negative.   Eyes: Negative.   Respiratory: Positive for shortness of breath. Negative for cough and chest tightness.   Cardiovascular: Positive for chest pain. Negative for palpitations and leg swelling.  Gastrointestinal: Positive for abdominal pain, diarrhea, nausea and vomiting. Negative for blood in stool.  Genitourinary: Negative for dysuria and hematuria.  Musculoskeletal: Negative.   Skin: Negative.   Neurological: Negative for syncope, weakness, light-headedness and headaches.  Psychiatric/Behavioral: The patient is nervous/anxious.     Physical Exam Updated Vital Signs BP (!) 127/93   Pulse 80   Temp 98.2 F (36.8 C) (Oral)   Resp 14   Ht 5\' 4"  (1.626 m)   Wt 90.7 kg   LMP 05/04/2020   SpO2 (!) 88%   BMI 34.33 kg/m   Physical Exam Vitals and nursing note reviewed.  Constitutional:      Appearance: She is ill-appearing.  HENT:     Head: Normocephalic and atraumatic.     Mouth/Throat:     Mouth: Mucous membranes are moist.     Pharynx: No oropharyngeal exudate or posterior oropharyngeal erythema.  Eyes:     General:        Right eye: No discharge.        Left eye: No discharge.     Conjunctiva/sclera: Conjunctivae normal.  Cardiovascular:     Rate and Rhythm: Regular rhythm. Tachycardia present.     Pulses: Normal pulses.     Heart sounds: Normal heart sounds. No murmur heard.   Pulmonary:     Effort: Tachypnea present.     Breath sounds: Normal breath sounds. No wheezing or rales.  Abdominal:     General: Bowel sounds are normal. There is no distension.     Tenderness: There is generalized abdominal tenderness. There is no rebound. Negative signs include Murphy's sign and McBurney's sign.  Musculoskeletal:        General: No deformity.     Cervical back: Neck supple.  Skin:    General: Skin is warm and dry.  Neurological:     Mental Status: She is alert. Mental status is at baseline.    Psychiatric:        Mood and Affect: Mood normal.     ED Results / Procedures / Treatments   Labs (all labs ordered are listed, but only abnormal results are displayed) Labs Reviewed  BASIC METABOLIC PANEL - Abnormal; Notable for the following components:      Result  Value   Sodium 134 (*)    Potassium 2.7 (*)    Chloride 86 (*)    Glucose, Bld 140 (*)    Creatinine, Ser 1.67 (*)    Calcium 10.5 (*)    GFR calc non Af Amer 36 (*)    GFR calc Af Amer 42 (*)    Anion gap 20 (*)    All other components within normal limits  CBC - Abnormal; Notable for the following components:   WBC 12.9 (*)    RBC 5.58 (*)    Hemoglobin 15.2 (*)    HCT 46.3 (*)    RDW 17.0 (*)    Platelets 445 (*)    All other components within normal limits  HEPATIC FUNCTION PANEL - Abnormal; Notable for the following components:   Bilirubin, Direct 0.3 (*)    All other components within normal limits  BASIC METABOLIC PANEL - Abnormal; Notable for the following components:   Potassium 2.7 (*)    Chloride 89 (*)    Glucose, Bld 120 (*)    Creatinine, Ser 1.61 (*)    GFR calc non Af Amer 37 (*)    GFR calc Af Amer 43 (*)    Anion gap 18 (*)    All other components within normal limits  RESPIRATORY PANEL BY RT PCR (FLU A&B, COVID)  LIPASE, BLOOD  I-STAT BETA HCG BLOOD, ED ([facility], [facility], [facility] ONLY)  TROPONIN I (HIGH SENSITIVITY)  TROPONIN I (HIGH SENSITIVITY)    EKG EKG Interpretation  Date/Time:  Sunday May 29 2020 08:35:02 EDT Ventricular Rate:  110 PR Interval:  124 QRS Duration: 86 QT Interval:  370 QTC Calculation: 500 R Axis:   39 Text Interpretation: Sinus tachycardia Nonspecific ST abnormality Abnormal ECG QT borderling prolonged Confirmed by Noemi Chapel [phone]) on 05/29/2020 10:25:30 AM   Radiology DG Chest 2 View  Result Date: 05/29/2020 CLINICAL DATA:  Chest pain, recently COVID positive EXAM: CHEST - 2 VIEW COMPARISON:  05/11/2020 FINDINGS: The heart size and mediastinal  contours are within normal limits. Both lungs are clear. The visualized skeletal structures are unremarkable except for degenerative changes and associated scoliosis. Trachea midline. Right shoulder arthroplasty partially imaged. IMPRESSION: No active cardiopulmonary disease. Electronically Signed   By: Jerilynn Mages.  Shick M.D.   On: 05/29/2020 09:13    Procedures Procedures (including critical care time)  Medications Ordered in ED Medications  potassium chloride 10 mEq in 100 mL IVPB (10 mEq Intravenous New Bag/Given 05/29/20 1528)  LORazepam (ATIVAN) injection 0.5 mg (0.5 mg Intravenous Given 05/29/20 1030)  sodium chloride 0.9 % bolus 1,000 mL (0 mLs Intravenous Stopped 05/29/20 1247)  potassium chloride 10 mEq in 100 mL IVPB (0 mEq Intravenous Stopped 05/29/20 1247)  potassium chloride SA (KLOR-CON) CR tablet 40 mEq (40 mEq Oral Given 05/29/20 1038)  magnesium sulfate IVPB 2 g 50 mL (0 g Intravenous Stopped 05/29/20 1247)  trimethobenzamide (TIGAN) injection 200 mg (200 mg Intramuscular Given 05/29/20 1350)  potassium chloride SA (KLOR-CON) CR tablet 40 mEq (40 mEq Oral Given 05/29/20 1527)  LORazepam (ATIVAN) injection 1 mg (1 mg Intravenous Given 05/29/20 1550)    ED Course  I have reviewed the triage vital signs and the nursing notes.  Pertinent labs & imaging results that were available during my care of the patient were reviewed by me and considered in my medical decision making (see chart for details).    MDM Rules/Calculators/A&P  49 year old female with history of generalized anxiety disorder, and hypokalemia presents this morning after waking with sharp central chest pain and shortness of breath.  Additionally, she reports she has been having nausea, emesis, diarrhea for the last 2 days, has been unable to eat or drink anything.  Administered 1 nitro and 324 mg of aspirin via EMS. She was recently discharged from the hospital following admission for hyponatremia and  hypokalemia secondary to several days of diarrhea.  She states she is on potassium repletion at home, but is unable to provide dosage.  Differential diagnosis for nausea vomiting and acute infectious etiologies, toxin exposure, diverticulitis, appendicitis, inflammatory bowel disease.    EKG in the emergency department revealed sinus tachycardia, prolonged QT, QTc of 500; no ST elevations.  Negative for acute cardiopulmonary disease.  My initial evaluation, patient reports her chest pain has improved since her arrival; her primary concern is persistent severe nausea.  0.5 mg Ativan administered for nausea.  CBC with elevated white count of 12.9, BMP concerning for hypokalemia of 2.7 and elevated creatinine of 1.61 (previously 1.67).  Hepatic function panel unremarkable, troponin negative 13, lipase normal at 24.  Differential diagnosis for chest pain includes but is not limited to ACS, PE, gastric reflux, anxiety. Given reassuring EKG, chest x-ray, negative troponin, and low risk for pulmonary embolism I do not feel that this patient's chest pain is cardiac in nature.  Additionally physical exam is reassuring with regular rhythm, though tachycardic.  Pulmonary exam is reassuring.  Additionally patient reports that her chest pain has resolved after administration of 0.5 mg of Ativan.   Potassium repletion with 10 mEq IV and 40 mEq PO, and Magnesium 2 g IV bolus. 1 L normal saline bolus also administered.  Patient continues to be fairly nauseous despite Ativan. She reports 3 episodes of a small amount nonbloody nonbilious emesis since I last saw her.  Per pharmacy recommendations prescribed trimethobenzamide rather than Phenergan due to patient's prolonged QT.  Repeat BMP revealed persistent hypokalemia of 2.7. Will replete further with 20 mEq IV and 40 mEq PO.   Patient continues to report severe nausea despite second antiemetic; will order Ativan again.   Recommend a third BMP,  Following  completion of IV and PO potassium repletion.    At this point patient continues to be hypokalemic despite IV repletion, with prolonged QT; additionally she has intractable nausea unresponsive to multiple medications in the emergency department. She may benefit from admission for observation, antiemetics, and for potassium recheck.    Hospitalist Dr. Roosevelt Locks consulted at this time, He will see the patient in the ED and admit her.    Final Clinical Impression(s) / ED Diagnoses Final diagnoses:  Hypokalemia  Prolonged QT interval  Nausea and vomiting, intractability of vomiting not specified, unspecified vomiting type    Rx / DC Orders ED Discharge Orders    None       Aura Dials 05/29/20 1612    Noemi Chapel, MD 06/04/20 [phone]

## 2020-05-29 NOTE — H&P (Signed)
History and Physical    PATRENA SANTALUCIA BZJ:[id number] DOB: October 26, 1970 DOA: 05/29/2020  PCP: Robyne Peers, MD (Confirm with patient/family/NH records and if not entered, this has to be entered at [facility] point of entry) Patient coming from: Home  I have personally briefly reviewed patient's old medical records in [place]  Chief Complaint: Chest pain, feeling weak.  HPI: RUMOR SUN is a 49 y.o. female with medical history significant of hypertension, HLD, chronic iron deficiency anemia, psoriatic arthritis, presented with new onset chest pain shortness breath and nausea.  Symptoms started this morning, stabbing-like chest pain center of the chest localized, persistent associated shortness of breath and nausea.  Before that, patient has been having severe nausea vomiting with dark-colored stomach content, cramping like lower abdominal pain and watery diarrhea for the last 2 to 3 days.  Denies any fever chills.  No shortness of breath.  Chest pain subsided after arrival in ED. ED Course: Troponin negative x2, EKG no acute ST changes.  Potassium 2.7, creatinine 1.6 compared to baseline 1.0.  Review of Systems: As per HPI otherwise 14 point review of systems negative.    Past Medical History:  Diagnosis Date  . Anxiety   . Blood transfusion without reported diagnosis   . Coronary artery disease   . Hyperlipidemia   . Hypertension   . Iron deficiency anemia 01/15/2019  . 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 Psoriatic arthritis ([place])   . Stroke [facility]) 2014    Past Surgical History:  Procedure Laterality Date  . CARPAL TUNNEL RELEASE     B/L hand  . CHOLECYSTECTOMY    . FOOT SURGERY     with hardware   . HERNIA REPAIR    . KNEE ARTHROSCOPY Bilateral 08/2014   [facility] Dr. Len Childs  . TEE WITHOUT CARDIOVERSION  01/22/2012   Procedure: TRANSESOPHAGEAL ECHOCARDIOGRAM (TEE);  Surgeon: Lelon Perla, MD;  Location: [facility] ENDOSCOPY;  Service: Cardiovascular;  Laterality: N/A;  . TOTAL HIP ARTHROPLASTY Bilateral      reports that she has quit smoking. She has never used smokeless tobacco. She reports current alcohol use. She reports that she does not use drugs.  Allergies  Allergen Reactions  . Nsaids Other (See Comments)    stroke CVA   . Benzoin Dermatitis and Other (See Comments)    Localized - Skin Red with Burning Sensation Skin turns red and gets inflamed     Family History  Problem Relation Age of Onset  . Hypertension Mother   . Hypertension Father   . Colon cancer Maternal Grandfather   . Colon cancer Paternal Grandfather   . Colon polyps Neg Hx   . Esophageal cancer Neg Hx   . Rectal cancer Neg Hx   . Stomach cancer Neg Hx      Prior to Admission medications   Medication Sig Start Date End Date Taking? Authorizing Provider  aspirin 325 MG EC tablet Take 325 mg by mouth daily.    Yes [provider]  atorvastatin (LIPITOR) 40 MG tablet Take 40 mg by mouth daily. 07/06/19  Yes [provider]  Buprenorphine HCl (BELBUCA) 450 MCG FILM Place 450 mcg inside cheek in the morning and at bedtime.  09/04/19   [provider]  escitalopram (LEXAPRO) 20 MG tablet Take 20 mg by mouth daily.    [provider]  losartan (COZAAR) 100 MG tablet Take 100 mg  by mouth daily. 12/21/19   [provider]  ondansetron (ZOFRAN ODT) 4 MG disintegrating tablet Take 1 tablet (4 mg total) by mouth every 8 (eight) hours as needed for nausea or vomiting. 05/14/20   Rai, Ripudeep K, MD  pregabalin (LYRICA) 75 MG capsule Take 150 mg by mouth 3 (three) times daily.     [provider]  tiZANidine (ZANAFLEX) 4 MG tablet Take 4 mg by mouth every 8 (eight) hours as needed for muscle spasms.  04/22/20   [provider]  traMADol (ULTRAM) 50 MG tablet Take 1 tablet (50 mg total) by mouth every 6 (six) hours as needed for severe pain.  05/14/20 05/14/21  Rai, Vernelle Emerald, MD  traZODone (DESYREL) 50 MG tablet Take 50 mg by mouth at bedtime.    [provider]    Physical Exam: Vitals:   05/29/20 1115 05/29/20 1300 05/29/20 1530 05/29/20 1600  BP: 126/85 120/88 120/83 (!) 127/93  Pulse: 77  97 80  Resp: 18 15 (!) 30 14  Temp:      TempSrc:      SpO2: 100% 98% 100% (!) 88%  Weight: 90.7 kg     Height: 5\' 4"  (1.626 m)       Constitutional: NAD, calm, comfortable Vitals:   05/29/20 1115 05/29/20 1300 05/29/20 1530 05/29/20 1600  BP: 126/85 120/88 120/83 (!) 127/93  Pulse: 77  97 80  Resp: 18 15 (!) 30 14  Temp:      TempSrc:      SpO2: 100% 98% 100% (!) 88%  Weight: 90.7 kg     Height: 5\' 4"  (1.626 m)      Eyes: PERRL, lids and conjunctivae normal ENMT: Mucous membranes are dry. Posterior pharynx clear of any exudate or lesions.Normal dentition.  Neck: normal, supple, no masses, no thyromegaly Respiratory: clear to auscultation bilaterally, no wheezing, no crackles. Normal respiratory effort. No accessory muscle use.  Cardiovascular: Regular rate and rhythm, no murmurs / rubs / gallops. No extremity edema. 2+ pedal pulses. No carotid bruits.  Abdomen: Mild tenderness on LLQ, no rebound no guarding, no masses palpated. No hepatosplenomegaly. Bowel sounds positive.  Musculoskeletal: no clubbing / cyanosis. No joint deformity upper and lower extremities. Good ROM, no contractures. Normal muscle tone.  Skin: no rashes, lesions, ulcers. No induration Neurologic: CN 2-12 grossly intact. Sensation intact, DTR normal. Strength 5/5 in all 4.  Psychiatric: Normal judgment and insight. Alert and oriented x 3. Normal mood.     Labs on Admission: I have personally reviewed following labs and imaging studies  CBC: Recent Labs  Lab 05/29/20 0838  WBC 12.9*  HGB 15.2*  HCT 46.3*  MCV 83.0  PLT 161*   Basic Metabolic Panel: Recent Labs  Lab 05/29/20 0838 05/29/20 1358  NA 134* 137  K 2.7* 2.7*  CL 86*  89*  CO2 28 30  GLUCOSE 140* 120*  BUN 19 18  CREATININE 1.67* 1.61*  CALCIUM 10.5* 9.4   GFR: Estimated Creatinine Clearance: 46.6 mL/min (A) (by C-G formula based on SCr of 1.61 mg/dL (H)). Liver Function Tests: Recent Labs  Lab 05/29/20 1050  AST 32  ALT 25  ALKPHOS 107  BILITOT 1.1  PROT 8.0  ALBUMIN 4.5   Recent Labs  Lab 05/29/20 1050  LIPASE 24   No results for input(s): AMMONIA in the last 168 hours. Coagulation Profile: No results for input(s): INR, PROTIME in the last 168 hours. Cardiac Enzymes: No results for input(s): CKTOTAL, CKMB,  CKMBINDEX, TROPONINI in the last 168 hours. BNP (last 3 results) No results for input(s): PROBNP in the last 8760 hours. HbA1C: No results for input(s): HGBA1C in the last 72 hours. CBG: No results for input(s): GLUCAP in the last 168 hours. Lipid Profile: No results for input(s): CHOL, HDL, LDLCALC, TRIG, CHOLHDL, LDLDIRECT in the last 72 hours. Thyroid Function Tests: No results for input(s): TSH, T4TOTAL, FREET4, T3FREE, THYROIDAB in the last 72 hours. Anemia Panel: No results for input(s): VITAMINB12, FOLATE, FERRITIN, TIBC, IRON, RETICCTPCT in the last 72 hours. Urine analysis:    Component Value Date/Time   COLORURINE YELLOW 05/12/2020 2042   APPEARANCEUR CLEAR 05/12/2020 2042   LABSPEC <1.005 (L) 05/12/2020 2042   PHURINE 6.0 05/12/2020 2042   GLUCOSEU NEGATIVE 05/12/2020 2042   HGBUR NEGATIVE 05/12/2020 2042   BILIRUBINUR NEGATIVE 05/12/2020 2042   KETONESUR NEGATIVE 05/12/2020 2042   PROTEINUR NEGATIVE 05/12/2020 2042   UROBILINOGEN 0.2 07/17/2014 0105   NITRITE NEGATIVE 05/12/2020 2042   LEUKOCYTESUR NEGATIVE 05/12/2020 2042    Radiological Exams on Admission: DG Chest 2 View  Result Date: 05/29/2020 CLINICAL DATA:  Chest pain, recently COVID positive EXAM: CHEST - 2 VIEW COMPARISON:  05/11/2020 FINDINGS: The heart size and mediastinal contours are within normal limits. Both lungs are clear. The  visualized skeletal structures are unremarkable except for degenerative changes and associated scoliosis. Trachea midline. Right shoulder arthroplasty partially imaged. IMPRESSION: No active cardiopulmonary disease. Electronically Signed   By: Jerilynn Mages.  Shick M.D.   On: 05/29/2020 09:13    EKG: Independently reviewed.  Sinus,, no acute ST changes  Assessment/Plan Active Problems:   Hypokalemia  (please populate well all problems here in Problem List. (For example, if patient is on BP meds at home and you resume or decide to hold them, it is a problem that needs to be her. Same for CAD, COPD, HLD and so on)  Hypokalemia -This is recurrent problem, patient presented to [facility] weeks ago for similar presentation, this time patient still have significant GI symptoms of gastroenteritis with nausea vomiting and diarrhea. -Replace with p.o. and IV potassium, check magnesium and phosphate -Long discussion with patient and her husband over the phone regarding further work-up for hypokalemia, appears both no more per fortunately for recurrent hypokalemia and problem.  Recommend him to follow-up with GI as outpatient for repeated colonoscopy after this episode, and patient did have a colonoscopy 6 months ago which showed normal colon mucosa.  If GI source of potassium loss is ruled out then patient showed consult nephrology for further work-up, however per blood pressure medication especially ACEI or ARB should be held.  Husband understood and agreed. -Ordered CT abdomen pelvis without contrast to further evaluate given the patient has lower abdominal pain this time.  -Monitor off ABX for now. -UDS  AKI -Likely from GI loss, continue aggressive IV hydration for today -CT abdomen and renal ultrasound to rule out any obstructions -Hold ARB  Chest pain -Noncardiac, probably related with repeated retching and nauseous vomit. -Tigan as needed  Anxiety depression -Continue SSRI  DVT  prophylaxis: Lovenox Code Status: Full code Family Communication: Husband over the phone, all questions answered at my best knowledge Disposition Plan: Expect less than 2 midnight hospital stay to treat AKI dehydration and hypokalemia Consults called: None Admission status: Telemetry observation   Lequita Halt MD [facility] Pager [phone]  05/29/2020, 7:10 PM

## 2020-05-29 NOTE — ED Notes (Signed)
Date and time results received: 05/29/20 1444 (use smartphrase ".now" to insert current time)  Test: Potassium Critical Value: 2.7  Name of Provider Notified: Sabra Heck  Orders Received? Or Actions Taken?: No new orders

## 2020-05-30 DIAGNOSIS — Z79899 Other long term (current) drug therapy: Secondary | ICD-10-CM | POA: Diagnosis not present

## 2020-05-30 DIAGNOSIS — R9431 Abnormal electrocardiogram [ECG] [EKG]: Secondary | ICD-10-CM | POA: Diagnosis not present

## 2020-05-30 DIAGNOSIS — D72829 Elevated white blood cell count, unspecified: Secondary | ICD-10-CM

## 2020-05-30 DIAGNOSIS — G2581 Restless legs syndrome: Secondary | ICD-10-CM | POA: Diagnosis present

## 2020-05-30 DIAGNOSIS — E876 Hypokalemia: Secondary | ICD-10-CM | POA: Diagnosis not present

## 2020-05-30 DIAGNOSIS — I2510 Atherosclerotic heart disease of native coronary artery without angina pectoris: Secondary | ICD-10-CM | POA: Diagnosis not present

## 2020-05-30 DIAGNOSIS — R197 Diarrhea, unspecified: Secondary | ICD-10-CM

## 2020-05-30 DIAGNOSIS — E785 Hyperlipidemia, unspecified: Secondary | ICD-10-CM | POA: Diagnosis not present

## 2020-05-30 DIAGNOSIS — Z20822 Contact with and (suspected) exposure to covid-19: Secondary | ICD-10-CM | POA: Diagnosis not present

## 2020-05-30 DIAGNOSIS — L4050 Arthropathic psoriasis, unspecified: Secondary | ICD-10-CM | POA: Diagnosis present

## 2020-05-30 DIAGNOSIS — K529 Noninfective gastroenteritis and colitis, unspecified: Secondary | ICD-10-CM | POA: Diagnosis not present

## 2020-05-30 DIAGNOSIS — Z7982 Long term (current) use of aspirin: Secondary | ICD-10-CM | POA: Diagnosis not present

## 2020-05-30 DIAGNOSIS — Z886 Allergy status to analgesic agent status: Secondary | ICD-10-CM | POA: Diagnosis not present

## 2020-05-30 DIAGNOSIS — Z79891 Long term (current) use of opiate analgesic: Secondary | ICD-10-CM | POA: Diagnosis not present

## 2020-05-30 DIAGNOSIS — N179 Acute kidney failure, unspecified: Secondary | ICD-10-CM

## 2020-05-30 DIAGNOSIS — R112 Nausea with vomiting, unspecified: Secondary | ICD-10-CM | POA: Diagnosis not present

## 2020-05-30 DIAGNOSIS — E782 Mixed hyperlipidemia: Secondary | ICD-10-CM | POA: Diagnosis not present

## 2020-05-30 DIAGNOSIS — Z8249 Family history of ischemic heart disease and other diseases of the circulatory system: Secondary | ICD-10-CM | POA: Diagnosis not present

## 2020-05-30 DIAGNOSIS — F411 Generalized anxiety disorder: Secondary | ICD-10-CM | POA: Diagnosis not present

## 2020-05-30 DIAGNOSIS — Z87891 Personal history of nicotine dependence: Secondary | ICD-10-CM | POA: Diagnosis not present

## 2020-05-30 DIAGNOSIS — E860 Dehydration: Secondary | ICD-10-CM | POA: Diagnosis not present

## 2020-05-30 DIAGNOSIS — Z96643 Presence of artificial hip joint, bilateral: Secondary | ICD-10-CM | POA: Diagnosis present

## 2020-05-30 DIAGNOSIS — G8929 Other chronic pain: Secondary | ICD-10-CM | POA: Diagnosis not present

## 2020-05-30 DIAGNOSIS — Z9049 Acquired absence of other specified parts of digestive tract: Secondary | ICD-10-CM | POA: Diagnosis not present

## 2020-05-30 DIAGNOSIS — E669 Obesity, unspecified: Secondary | ICD-10-CM | POA: Diagnosis present

## 2020-05-30 DIAGNOSIS — R0789 Other chest pain: Secondary | ICD-10-CM | POA: Diagnosis not present

## 2020-05-30 DIAGNOSIS — I10 Essential (primary) hypertension: Secondary | ICD-10-CM | POA: Diagnosis not present

## 2020-05-30 DIAGNOSIS — Z8673 Personal history of transient ischemic attack (TIA), and cerebral infarction without residual deficits: Secondary | ICD-10-CM | POA: Diagnosis not present

## 2020-05-30 DIAGNOSIS — D509 Iron deficiency anemia, unspecified: Secondary | ICD-10-CM | POA: Diagnosis not present

## 2020-05-30 LAB — GASTROINTESTINAL PANEL BY PCR, STOOL (REPLACES STOOL CULTURE)

## 2020-05-30 LAB — PROCALCITONIN: Procalcitonin: <0.1 ng/mL

## 2020-05-30 LAB — BASIC METABOLIC PANEL
Anion gap: 15 (ref 5–15)
BUN: 22 mg/dL — ABNORMAL HIGH (ref 6–20)
CO2: 31 mmol/L (ref 22–32)
Calcium: 9.1 mg/dL (ref 8.9–10.3)
Chloride: 86 mmol/L — ABNORMAL LOW (ref 98–111)
Creatinine, Ser: 1.34 mg/dL — ABNORMAL HIGH (ref 0.44–1.00)
GFR calc Af Amer: 54 mL/min — ABNORMAL LOW (ref >60)
GFR calc non Af Amer: 47 mL/min — ABNORMAL LOW (ref >60)
Glucose, Bld: 114 mg/dL — ABNORMAL HIGH (ref 70–99)
Potassium: 2 mmol/L — CL (ref 3.5–5.1)
Sodium: 132 mmol/L — ABNORMAL LOW (ref 135–145)

## 2020-05-30 LAB — C DIFFICILE QUICK SCREEN W PCR REFLEX
C Diff antigen: NEGATIVE
C Diff interpretation: NOT DETECTED
C Diff toxin: NEGATIVE

## 2020-05-30 LAB — POTASSIUM: Potassium: 3 mmol/L — ABNORMAL LOW (ref 3.5–5.1)

## 2020-05-30 MED ORDER — TRAZODONE HCL 50 MG PO TABS
25.0000 mg | ORAL_TABLET | Freq: Every evening | ORAL | Status: DC | PRN
  Administered 2020-05-31 – 2020-06-01 (×3): 25 mg via ORAL
  Filled 2020-05-30 (×3): qty 1

## 2020-05-30 MED ORDER — POTASSIUM CHLORIDE CRYS ER 20 MEQ PO TBCR
40.0000 meq | EXTENDED_RELEASE_TABLET | ORAL | Status: AC
  Administered 2020-05-30 (×2): 40 meq via ORAL
  Filled 2020-05-30 (×2): qty 2

## 2020-05-30 MED ORDER — LORAZEPAM 2 MG/ML IJ SOLN
1.0000 mg | INTRAMUSCULAR | Status: AC | PRN
  Administered 2020-05-30 (×2): 1 mg via INTRAVENOUS
  Filled 2020-05-30 (×2): qty 1

## 2020-05-30 MED ORDER — BUPRENORPHINE HCL 450 MCG BU FILM: 450.0000 ug | ORAL_FILM | Freq: Every day | BUCCAL | Status: DC

## 2020-05-30 MED ORDER — PANTOPRAZOLE SODIUM 40 MG IV SOLR
40.0000 mg | Freq: Every day | INTRAVENOUS | Status: DC
  Administered 2020-05-30 – 2020-05-31 (×2): 40 mg via INTRAVENOUS
  Filled 2020-05-30 (×2): qty 40

## 2020-05-30 MED ORDER — TIZANIDINE HCL 4 MG PO TABS
4.0000 mg | ORAL_TABLET | Freq: Three times a day (TID) | ORAL | Status: DC | PRN
  Administered 2020-05-31 – 2020-06-01 (×4): 4 mg via ORAL
  Filled 2020-05-30 (×5): qty 1

## 2020-05-30 MED ORDER — PREGABALIN 75 MG PO CAPS
75.0000 mg | ORAL_CAPSULE | Freq: Three times a day (TID) | ORAL | Status: DC
  Administered 2020-05-30 – 2020-06-02 (×9): 75 mg via ORAL
  Filled 2020-05-30 (×9): qty 1

## 2020-05-30 MED ORDER — ALUM & MAG HYDROXIDE-SIMETH 200-200-20 MG/5ML PO SUSP
15.0000 mL | ORAL | Status: DC | PRN
  Administered 2020-05-30 – 2020-06-01 (×2): 15 mL via ORAL
  Filled 2020-05-30 (×2): qty 30

## 2020-05-30 MED ORDER — ESCITALOPRAM OXALATE 10 MG PO TABS
20.0000 mg | ORAL_TABLET | Freq: Every day | ORAL | Status: DC
  Administered 2020-05-30 – 2020-06-02 (×4): 20 mg via ORAL
  Filled 2020-05-30 (×5): qty 2

## 2020-05-30 MED ORDER — DICYCLOMINE HCL 10 MG PO CAPS
10.0000 mg | ORAL_CAPSULE | Freq: Four times a day (QID) | ORAL | Status: DC | PRN
  Administered 2020-05-30 – 2020-06-02 (×9): 10 mg via ORAL
  Filled 2020-05-30 (×9): qty 1

## 2020-05-30 MED ORDER — POTASSIUM CHLORIDE IN NACL 40-0.9 MEQ/L-% IV SOLN
INTRAVENOUS | Status: AC
  Filled 2020-05-30 (×2): qty 1000

## 2020-05-30 MED ORDER — LOPERAMIDE HCL 2 MG PO CAPS
4.0000 mg | ORAL_CAPSULE | Freq: Four times a day (QID) | ORAL | Status: DC | PRN
  Administered 2020-05-30 (×2): 4 mg via ORAL
  Administered 2020-05-31: 2 mg via ORAL
  Administered 2020-05-31 (×2): 4 mg via ORAL
  Administered 2020-05-31: 2 mg via ORAL
  Administered 2020-06-01 – 2020-06-02 (×4): 4 mg via ORAL
  Filled 2020-05-30 (×12): qty 2

## 2020-05-30 NOTE — Progress Notes (Signed)
   05/29/20 1917  Assess: MEWS Score  Temp 98 F (36.7 C)  BP (!) 145/106  Pulse Rate 100  ECG Heart Rate (!) 102  Resp (!) 22  SpO2 99 %  Assess: MEWS Score  MEWS Temp 0  MEWS Systolic 0  MEWS Pulse 1  MEWS RR 1  MEWS LOC 0  MEWS Score 2  MEWS Score Color Yellow  Assess: if the MEWS score is Yellow or Red  Were vital signs taken at a resting state? Yes  Focused Assessment Change from prior assessment (see assessment flowsheet)  Early Detection of Sepsis Score *See Row Information* Low  MEWS guidelines implemented *See Row Information* Yes  Treat  MEWS Interventions Escalated (See documentation below)  Take Vital Signs  Increase Vital Sign Frequency  Yellow: Q 2hr X 2 then Q 4hr X 2, if remains yellow, continue Q 4hrs  Escalate  MEWS: Escalate Yellow: discuss with charge nurse/RN and consider discussing with provider and RRT  Notify: Charge Nurse/RN  Name of Charge Nurse/RN Notified Jamal Collin, RN  Date Charge Nurse/RN Notified 05/29/20  Time Charge Nurse/RN Notified 1917  Document  Patient Outcome Other (Comment) (No intervention)  Progress note created (see row info) Yes

## 2020-05-30 NOTE — Progress Notes (Signed)
Patient ID: [name] [name], female   DOB: 1971-06-25, 49 y.o.   MRN: [id number]  PROGRESS NOTE    CAMEREN ODWYER  PPI:[id number] DOB: October 02, 1970 DOA: 05/29/2020 PCP: Robyne Peers, MD   Brief Narrative:  49 y.o. female with medical history significant of hypertension, HLD, chronic iron deficiency anemia, psoriatic arthritis, presented with new onset chest pain shortness breath and nausea/vomiting with diarrhea.  On presentation, troponin was negative x2; EKG with no acute ST changes.  Potassium was 2.7 and creatinine was 1.6, compared to baseline of 1.0.  She was started on IV fluids.  Assessment & Plan:   Hypokalemia -Recurrent problem.  Patient presented [facility] few weeks ago for similar presentation and was treated with supportive management. -Potassium still low at 2 this morning.  Continue oral and intravenous replacement.  Recheck potassium this afternoon and this evening again. -Patient did have a colonoscopy 6 months ago which showed normal colon mucosa.  Follow-up with GI as an outpatient at earliest convenience.  If GI source of potassium loss was ruled out, patient will need to be evaluated by nephrology as an outpatient  Nausea/vomiting with diarrhea -Questionable cause.  Still having watery diarrhea.  Stool for C. difficile and GI PCR pending.  If stool for C. difficile is negative, will start Imodium.  Antiemetics as needed.  Add Protonix -Advance diet as tolerated -CT of the abdomen and pelvis was negative for any significant abnormality -Check urine drug screen  Acute kidney injury -Probably from dehydration.  Improving, creatinine 1.34 today from 1.67 on presentation -Continue IV fluids.  Renal ultrasound was negative for hydronephrosis/obstruction  Chest pain -Noncardiac, probably related to retching and vomiting.  Resolved.  Troponin x2 were negative on presentation with nonischemic EKG  Leukocytosis -Probably reactive.   Monitor  Thrombocytosis -Probably reactive.  Monitor  Anxiety/depression -Continue SSRI   DVT prophylaxis: Lovenox Code Status: Full Family Communication: None at bedside Disposition Plan: Status is: Inpatient  Remains inpatient appropriate because:Inpatient level of care appropriate due to severity of illness.  We will switch to n.p.o. as patient is still nauseous with persistent watery diarrhea and does not feel well.  Will need to continue IV fluids.  Dispo: The patient is from: Home              Anticipated d/c is to: Home              Anticipated d/c date is: 1 day              Patient currently is not medically stable to d/c.   Consultants: None  Procedures: None  Antimicrobials: None   Subjective: Patient seen and examined at bedside.  She does not feel well and still complains of nausea with multiple episodes of watery diarrhea.  No overnight fever reported.  Denies any worsening shortness of breath.  Chest pain has improved.  Objective: Vitals:   05/29/20 1600 05/29/20 1917 05/29/20 2358 05/30/20 0800  BP: (!) 127/93 (!) 145/106 (!) 140/98 (!) 131/95  Pulse: 80 100 88 80  Resp: 14 (!) 22 (!) 22 19  Temp:  98 F (36.7 C) 98.4 F (36.9 C) 98.3 F (36.8 C)  TempSrc:  Oral Oral Oral  SpO2: (!) 88% 99% 98%   Weight:  83.1 kg    Height:        Intake/Output Summary (Last 24 hours) at 05/30/2020 0944 Last data filed at 05/30/2020 0617 Gross per 24 hour  Intake 1063.36 ml  Output --  Net 1063.36 ml   Filed Weights   05/29/20 1115 05/29/20 1917  Weight: 90.7 kg 83.1 kg    Examination:  General exam: Appears calm and comfortable. Respiratory system: Bilateral decreased breath sounds at bases Cardiovascular system: S1 & S2 heard, Rate controlled Gastrointestinal system: Abdomen is nondistended, soft and mildly tender in the right epigastric region. Normal bowel sounds heard. Extremities: No cyanosis, clubbing, edema  Central nervous system: Alert and  oriented. No focal neurological deficits. Moving extremities Skin: No rashes, lesions or ulcers Psychiatry: Flat affect    Data Reviewed: I have personally reviewed following labs and imaging studies  CBC: Recent Labs  Lab 05/29/20 0838  WBC 12.9*  HGB 15.2*  HCT 46.3*  MCV 83.0  PLT 086*   Basic Metabolic Panel: Recent Labs  Lab 05/29/20 0838 05/29/20 1358 05/29/20 1947 05/30/20 0144  NA 134* 137  --  132*  K 2.7* 2.7*  --  2.0*  CL 86* 89*  --  86*  CO2 28 30  --  31  GLUCOSE 140* 120*  --  114*  BUN 19 18  --  22*  CREATININE 1.67* 1.61*  --  1.34*  CALCIUM 10.5* 9.4  --  9.1  MG  --   --  2.4  --   PHOS  --   --  5.0*  --    GFR: Estimated Creatinine Clearance: 53.6 mL/min (A) (by C-G formula based on SCr of 1.34 mg/dL (H)). Liver Function Tests: Recent Labs  Lab 05/29/20 1050  AST 32  ALT 25  ALKPHOS 107  BILITOT 1.1  PROT 8.0  ALBUMIN 4.5   Recent Labs  Lab 05/29/20 1050  LIPASE 24   No results for input(s): AMMONIA in the last 168 hours. Coagulation Profile: No results for input(s): INR, PROTIME in the last 168 hours. Cardiac Enzymes: No results for input(s): CKTOTAL, CKMB, CKMBINDEX, TROPONINI in the last 168 hours. BNP (last 3 results) No results for input(s): PROBNP in the last 8760 hours. HbA1C: No results for input(s): HGBA1C in the last 72 hours. CBG: No results for input(s): GLUCAP in the last 168 hours. Lipid Profile: No results for input(s): CHOL, HDL, LDLCALC, TRIG, CHOLHDL, LDLDIRECT in the last 72 hours. Thyroid Function Tests: No results for input(s): TSH, T4TOTAL, FREET4, T3FREE, THYROIDAB in the last 72 hours. Anemia Panel: No results for input(s): VITAMINB12, FOLATE, FERRITIN, TIBC, IRON, RETICCTPCT in the last 72 hours. Sepsis Labs: Recent Labs  Lab 05/29/20 1947 05/30/20 0144  PROCALCITON <0.10 <0.10    Recent Results (from the past 240 hour(s))  Respiratory Panel by RT PCR (Flu A&B, Covid) - Nasopharyngeal Swab      Status: None   Collection Time: 05/29/20  4:24 PM   Specimen: Nasopharyngeal Swab  Result Value Ref Range Status   SARS Coronavirus 2 by RT PCR NEGATIVE NEGATIVE Final    Comment: (NOTE) SARS-CoV-2 target nucleic acids are NOT DETECTED.  The SARS-CoV-2 RNA is generally detectable in upper respiratoy specimens during the acute phase of infection. The lowest concentration of SARS-CoV-2 viral copies this assay can detect is 131 copies/mL. A negative result does not preclude SARS-Cov-2 infection and should not be used as the sole basis for treatment or other patient management decisions. A negative result may occur with  improper specimen collection/handling, submission of specimen other than nasopharyngeal swab, presence of viral mutation(s) within the areas targeted by this assay, and inadequate number of viral copies (<131 copies/mL). A negative result  must be combined with clinical observations, patient history, and epidemiological information. The expected result is Negative.  Fact Sheet for Patients:  PinkCheek.be  Fact Sheet for Healthcare Providers:  GravelBags.it  This test is no t yet approved or cleared by the [place] FDA and  has been authorized for detection and/or diagnosis of SARS-CoV-2 by FDA under an Emergency Use Authorization (EUA). This EUA will remain  in effect (meaning this test can be used) for the duration of the COVID-19 declaration under Section 564(b)(1) of the Act, 21 U.S.C. section 360bbb-3(b)(1), unless the authorization is terminated or revoked sooner.     Influenza A by PCR NEGATIVE NEGATIVE Final   Influenza B by PCR NEGATIVE NEGATIVE Final    Comment: (NOTE) The Xpert Xpress SARS-CoV-2/FLU/RSV assay is intended as an aid in  the diagnosis of influenza from Nasopharyngeal swab specimens and  should not be used as a sole basis for treatment. Nasal washings and  aspirates are  unacceptable for Xpert Xpress SARS-CoV-2/FLU/RSV  testing.  Fact Sheet for Patients: PinkCheek.be  Fact Sheet for Healthcare Providers: GravelBags.it  This test is not yet approved or cleared by the [place] FDA and  has been authorized for detection and/or diagnosis of SARS-CoV-2 by  FDA under an Emergency Use Authorization (EUA). This EUA will remain  in effect (meaning this test can be used) for the duration of the  Covid-19 declaration under Section 564(b)(1) of the Act, 21  U.S.C. section 360bbb-3(b)(1), unless the authorization is  terminated or revoked. Performed at [facility], [place] [address], [place], [place] [address]          Radiology Studies: CT ABDOMEN PELVIS WO CONTRAST  Result Date: 05/30/2020 CLINICAL DATA:  Abdominal pain. EXAM: CT ABDOMEN AND PELVIS WITHOUT CONTRAST TECHNIQUE: Multidetector CT imaging of the abdomen and pelvis was performed following the standard protocol without IV contrast. COMPARISON:  CT dated November 19, 2019. FINDINGS: Lower chest: The lung bases are clear. The heart size is normal. Hepatobiliary: The liver is normal. Status post cholecystectomy.There is no biliary ductal dilation. Pancreas: Normal contours without ductal dilatation. No peripancreatic fluid collection. Spleen: Unremarkable. Adrenals/Urinary Tract: --Adrenal glands: Unremarkable. --Right kidney/ureter: No hydronephrosis or radiopaque kidney stones. --Left kidney/ureter: No hydronephrosis or radiopaque kidney stones. --Urinary bladder: Unremarkable. Stomach/Bowel: --Stomach/Duodenum: No hiatal hernia or other gastric abnormality. Normal duodenal course and caliber. --Small bowel: Unremarkable. --Colon: Unremarkable. --Appendix: Normal. Vascular/Lymphatic: Normal course and caliber of the major abdominal vessels. --No retroperitoneal lymphadenopathy. --No mesenteric lymphadenopathy. --No pelvic or inguinal  lymphadenopathy. Reproductive: Unremarkable Other: No ascites or free air. The abdominal wall is normal. Musculoskeletal. Multilevel degenerative changes are noted throughout the lumbar spine. Patient is status post prior bilateral total hip arthroplasty. There is chronic lucency about the femoral stem on the left which may indicate underlying loosening. IMPRESSION: No acute abnormality.  Chronic findings as detailed above. Electronically Signed   By: Constance Holster M.D.   On: 05/30/2020 00:00   DG Chest 2 View  Result Date: 05/29/2020 CLINICAL DATA:  Chest pain, recently COVID positive EXAM: CHEST - 2 VIEW COMPARISON:  05/11/2020 FINDINGS: The heart size and mediastinal contours are within normal limits. Both lungs are clear. The visualized skeletal structures are unremarkable except for degenerative changes and associated scoliosis. Trachea midline. Right shoulder arthroplasty partially imaged. IMPRESSION: No active cardiopulmonary disease. Electronically Signed   By: Jerilynn Mages.  Shick M.D.   On: 05/29/2020 09:13   US RENAL  Result Date: 05/29/2020 CLINICAL DATA:  Acute  kidney injury. EXAM: RENAL / URINARY TRACT ULTRASOUND COMPLETE COMPARISON:  None. FINDINGS: Right Kidney: Renal measurements: 12.0 x 4.2 x 5.7 cm = volume: 150 mL. Echogenicity within normal limits. No mass or hydronephrosis visualized. Left Kidney: Renal measurements: 12.3 x 5.2 x 5.9 cm = volume: 197 mL. Echogenicity within normal limits. No mass or hydronephrosis visualized. Bladder: Appears normal for degree of bladder distention. Other: None. IMPRESSION: Unremarkable sonographic appearance of the kidneys and bladder. Electronically Signed   By: Keith Rake M.D.   On: 05/29/2020 20:48        Scheduled Meds: . enoxaparin (LOVENOX) injection  40 mg Subcutaneous Q24H  . trimethobenzamide  300 mg Oral TID   Continuous Infusions: . 0.9 % NaCl with KCl 40 mEq / L 125 mL/hr at 05/30/20 0429          Aline August,  MD [facility] 05/30/2020, 9:44 AM

## 2020-05-30 NOTE — Progress Notes (Signed)
CRITICAL VALUE ALERT  Critical Value:  K 2.0  Date & Time Notied:  05/30/20 @ 0320  Provider Notified: Dr Myna Hidalgo  Orders Received/Actions taken: 40 mEq x 2,  0.9% NaCl with KCl 40 mEq infusion

## 2020-05-31 DIAGNOSIS — G8929 Other chronic pain: Secondary | ICD-10-CM

## 2020-05-31 LAB — CBC WITH DIFFERENTIAL/PLATELET
Abs Immature Granulocytes: 0.04 10*3/uL (ref 0.00–0.07)
Basophils Absolute: 0.1 10*3/uL (ref 0.0–0.1)
Basophils Relative: 1 %
Eosinophils Absolute: 0.3 10*3/uL (ref 0.0–0.5)
Eosinophils Relative: 3 %
HCT: 40.6 % (ref 36.0–46.0)
Hemoglobin: 12.8 g/dL (ref 12.0–15.0)
Immature Granulocytes: 0 %
Lymphocytes Relative: 25 %
Lymphs Abs: 2.3 10*3/uL (ref 0.7–4.0)
MCH: 26.9 pg (ref 26.0–34.0)
MCHC: 31.5 g/dL (ref 30.0–36.0)
MCV: 85.5 fL (ref 80.0–100.0)
Monocytes Absolute: 0.7 10*3/uL (ref 0.1–1.0)
Monocytes Relative: 8 %
Neutro Abs: 5.7 10*3/uL (ref 1.7–7.7)
Neutrophils Relative %: 63 %
Platelets: 301 10*3/uL (ref 150–400)
RBC: 4.75 MIL/uL (ref 3.87–5.11)
RDW: 16.9 % — ABNORMAL HIGH (ref 11.5–15.5)
WBC: 9 10*3/uL (ref 4.0–10.5)
nRBC: 0 % (ref 0.0–0.2)

## 2020-05-31 LAB — BASIC METABOLIC PANEL
Anion gap: 13 (ref 5–15)
BUN: 11 mg/dL (ref 6–20)
CO2: 27 mmol/L (ref 22–32)
Calcium: 9.2 mg/dL (ref 8.9–10.3)
Chloride: 94 mmol/L — ABNORMAL LOW (ref 98–111)
Creatinine, Ser: 0.74 mg/dL (ref 0.44–1.00)
GFR calc Af Amer: >60 mL/min (ref >60)
GFR calc non Af Amer: >60 mL/min (ref >60)
Glucose, Bld: 104 mg/dL — ABNORMAL HIGH (ref 70–99)
Potassium: 3.1 mmol/L — ABNORMAL LOW (ref 3.5–5.1)
Sodium: 134 mmol/L — ABNORMAL LOW (ref 135–145)

## 2020-05-31 LAB — PROCALCITONIN: Procalcitonin: <0.1 ng/mL

## 2020-05-31 LAB — MAGNESIUM: Magnesium: 2.3 mg/dL (ref 1.7–2.4)

## 2020-05-31 MED ORDER — PANTOPRAZOLE SODIUM 40 MG PO TBEC
40.0000 mg | DELAYED_RELEASE_TABLET | Freq: Every day | ORAL | Status: DC
  Administered 2020-06-01 – 2020-06-02 (×2): 40 mg via ORAL
  Filled 2020-05-31 (×2): qty 1

## 2020-05-31 MED ORDER — POTASSIUM CHLORIDE CRYS ER 20 MEQ PO TBCR
40.0000 meq | EXTENDED_RELEASE_TABLET | ORAL | Status: AC
  Administered 2020-05-31 (×2): 40 meq via ORAL
  Filled 2020-05-31 (×2): qty 2

## 2020-05-31 NOTE — Progress Notes (Addendum)
CSW provided outpatient substance use treatment services resources to patient per patient request.   CSW will continue to follow for any other patient needs.

## 2020-05-31 NOTE — Progress Notes (Signed)
Patient ID: [name] [name], female   DOB: 1971/07/28, 49 y.o.   MRN: [id number]  PROGRESS NOTE    [name] [name]  BZJ:[id number] DOB: 06/14/1971 DOA: 05/29/2020 PCP: Robyne Peers, MD   Brief Narrative:  49 y.o. female with medical history significant of hypertension, HLD, chronic iron deficiency anemia, psoriatic arthritis, presented with new onset chest pain shortness breath and nausea/vomiting with diarrhea.  On presentation, troponin was negative x2; EKG with no acute ST changes.  Potassium was 2.7 and creatinine was 1.6, compared to baseline of 1.0.  She was started on IV fluids.  Assessment & Plan:   Hypokalemia -Recurrent problem.  Patient presented to [facility] long hospital few weeks ago for similar presentation and was treated with supportive management. -Presently 0.1 this morning.  Replace.  Repeat a.m. labs.  Magnesium level normal this morning. -Patient did have a colonoscopy 6 months ago which showed normal colon mucosa.  Follow-up with GI as an outpatient at earliest convenience.  If GI source of potassium loss was ruled out, patient will need to be evaluated by nephrology as an outpatient  Nausea/vomiting with diarrhea -Questionable cause.  Stool for C. difficile and GI PCR negative.  Continue Imodium.  Diarrhea improving but still having significant.  If diarrhea does not improve by tomorrow, might need GI evaluation as well. -Tolerating diet -CT of the abdomen and pelvis was negative for any significant abnormality -Urine drug screen is pending.  Acute kidney injury -Probably from dehydration.  Creatinine 1.67 on presentation.  Resolved.  Creatinine 0.74 today. -DC IV fluids.  Renal ultrasound was negative for hydronephrosis/obstruction  Chest pain -Noncardiac, probably related to retching and vomiting.  Resolved.  Troponin x2 were negative on presentation with nonischemic EKG.  Currently denies any chest pain.  Prolonged QTC -Improving.  Repeat a.m.  EKG.  Leukocytosis -Probably reactive.  Monitor  Thrombocytosis -Probably reactive.  Monitor  Anxiety/depression Chronic pain -Continue SSRI -Patient is on multiple medications at home for her pain/depression including high doses of Lyrica, buprenorphine, tizanidine.  Recommended outpatient follow-up with PCP/pain management regarding de-escalation/reevaluation of these doses.   DVT prophylaxis: Lovenox Code Status: Full Family Communication: None at bedside Disposition Plan: Status is: Inpatient  Remains inpatient appropriate because:Inpatient level of care appropriate due to severity of illness.  Still having diarrhea, only slightly improved.  Will need continued inpatient care.  Dispo: The patient is from: Home              Anticipated d/c is to: Home              Anticipated d/c date is: 1 day              Patient currently is not medically stable to d/c.   Consultants: None  Procedures: None  Antimicrobials: None   Subjective: Patient seen and examined at bedside.  Feels slightly better.  Still having watery diarrhea but slightly improved.  Denies worsening abdominal pain.  No nausea or vomiting reported.  No overnight fevers.  Objective: Vitals:   05/30/20 1252 05/30/20 1944 05/30/20 2335 05/31/20 0337  BP: (!) 134/95 (!) 131/91 (!) 133/92   Pulse: 83 80 72 78  Resp: 18 18  12   Temp: 98.5 F (36.9 C) 98.7 F (37.1 C) 98.7 F (37.1 C) 98.4 F (36.9 C)  TempSrc: Oral Oral Oral Oral  SpO2: 97%  97% 98%  Weight:    82.8 kg  Height:        Intake/Output Summary (Last 24  hours) at 05/31/2020 1002 Last data filed at 05/30/2020 2220 Gross per 24 hour  Intake 240 ml  Output --  Net 240 ml   Filed Weights   05/29/20 1115 05/29/20 1917 05/31/20 0337  Weight: 90.7 kg 83.1 kg 82.8 kg    Examination:  General exam: Appears calm and comfortable. Respiratory system: Bilateral decreased breath sounds at bases with some scattered crackles Cardiovascular  system: Rate controlled, S1-S2 heard Gastrointestinal system: Abdomen is nondistended, soft and mildly tender in the right epigastric region.  Bowel sounds are heard Extremities: No edema, clubbing Central nervous system: Awake and alert.  No focal neurological deficits.  Moves extremities  skin: No obvious ecchymosis/ulcers Psychiatry: Extremely flat affect.    Data Reviewed: I have personally reviewed following labs and imaging studies  CBC: Recent Labs  Lab 05/29/20 0838 05/31/20 0136  WBC 12.9* 9.0  NEUTROABS  --  5.7  HGB 15.2* 12.8  HCT 46.3* 40.6  MCV 83.0 85.5  PLT 445* 756   Basic Metabolic Panel: Recent Labs  Lab 05/29/20 0838 05/29/20 1358 05/29/20 1947 05/30/20 0144 05/30/20 1454 05/31/20 0136  NA 134* 137  --  132*  --  134*  K 2.7* 2.7*  --  2.0* 3.0* 3.1*  CL 86* 89*  --  86*  --  94*  CO2 28 30  --  31  --  27  GLUCOSE 140* 120*  --  114*  --  104*  BUN 19 18  --  22*  --  11  CREATININE 1.67* 1.61*  --  1.34*  --  0.74  CALCIUM 10.5* 9.4  --  9.1  --  9.2  MG  --   --  2.4  --   --  2.3  PHOS  --   --  5.0*  --   --   --    GFR: Estimated Creatinine Clearance: 89.5 mL/min (by C-G formula based on SCr of 0.74 mg/dL). Liver Function Tests: Recent Labs  Lab 05/29/20 1050  AST 32  ALT 25  ALKPHOS 107  BILITOT 1.1  PROT 8.0  ALBUMIN 4.5   Recent Labs  Lab 05/29/20 1050  LIPASE 24   No results for input(s): AMMONIA in the last 168 hours. Coagulation Profile: No results for input(s): INR, PROTIME in the last 168 hours. Cardiac Enzymes: No results for input(s): CKTOTAL, CKMB, CKMBINDEX, TROPONINI in the last 168 hours. BNP (last 3 results) No results for input(s): PROBNP in the last 8760 hours. HbA1C: No results for input(s): HGBA1C in the last 72 hours. CBG: No results for input(s): GLUCAP in the last 168 hours. Lipid Profile: No results for input(s): CHOL, HDL, LDLCALC, TRIG, CHOLHDL, LDLDIRECT in the last 72 hours. Thyroid Function  Tests: No results for input(s): TSH, T4TOTAL, FREET4, T3FREE, THYROIDAB in the last 72 hours. Anemia Panel: No results for input(s): VITAMINB12, FOLATE, FERRITIN, TIBC, IRON, RETICCTPCT in the last 72 hours. Sepsis Labs: Recent Labs  Lab 05/29/20 1947 05/30/20 0144 05/31/20 0136  PROCALCITON <0.10 <0.10 <0.10    Recent Results (from the past 240 hour(s))  Respiratory Panel by RT PCR (Flu A&B, Covid) - Nasopharyngeal Swab     Status: None   Collection Time: 05/29/20  4:24 PM   Specimen: Nasopharyngeal Swab  Result Value Ref Range Status   SARS Coronavirus 2 by RT PCR NEGATIVE NEGATIVE Final    Comment: (NOTE) SARS-CoV-2 target nucleic acids are NOT DETECTED.  The SARS-CoV-2 RNA is generally detectable in upper respiratoy  specimens during the acute phase of infection. The lowest concentration of SARS-CoV-2 viral copies this assay can detect is 131 copies/mL. A negative result does not preclude SARS-Cov-2 infection and should not be used as the sole basis for treatment or other patient management decisions. A negative result may occur with  improper specimen collection/handling, submission of specimen other than nasopharyngeal swab, presence of viral mutation(s) within the areas targeted by this assay, and inadequate number of viral copies (<131 copies/mL). A negative result must be combined with clinical observations, patient history, and epidemiological information. The expected result is Negative.  Fact Sheet for Patients:  PinkCheek.be  Fact Sheet for Healthcare Providers:  GravelBags.it  This test is no t yet approved or cleared by the [place] FDA and  has been authorized for detection and/or diagnosis of SARS-CoV-2 by FDA under an Emergency Use Authorization (EUA). This EUA will remain  in effect (meaning this test can be used) for the duration of the COVID-19 declaration under Section 564(b)(1) of the Act,  21 U.S.C. section 360bbb-3(b)(1), unless the authorization is terminated or revoked sooner.     Influenza A by PCR NEGATIVE NEGATIVE Final   Influenza B by PCR NEGATIVE NEGATIVE Final    Comment: (NOTE) The Xpert Xpress SARS-CoV-2/FLU/RSV assay is intended as an aid in  the diagnosis of influenza from Nasopharyngeal swab specimens and  should not be used as a sole basis for treatment. Nasal washings and  aspirates are unacceptable for Xpert Xpress SARS-CoV-2/FLU/RSV  testing.  Fact Sheet for Patients: PinkCheek.be  Fact Sheet for Healthcare Providers: GravelBags.it  This test is not yet approved or cleared by the [place] FDA and  has been authorized for detection and/or diagnosis of SARS-CoV-2 by  FDA under an Emergency Use Authorization (EUA). This EUA will remain  in effect (meaning this test can be used) for the duration of the  Covid-19 declaration under Section 564(b)(1) of the Act, 21  U.S.C. section 360bbb-3(b)(1), unless the authorization is  terminated or revoked. Performed at [facility], [place] [address], [place], [place] [address]   Gastrointestinal Panel by PCR , Stool     Status: None   Collection Time: 05/30/20  7:52 AM   Specimen: STOOL  Result Value Ref Range Status   Campylobacter species NOT DETECTED NOT DETECTED Final   Plesimonas shigelloides NOT DETECTED NOT DETECTED Final   Salmonella species NOT DETECTED NOT DETECTED Final   Yersinia enterocolitica NOT DETECTED NOT DETECTED Final   Vibrio species NOT DETECTED NOT DETECTED Final   Vibrio cholerae NOT DETECTED NOT DETECTED Final   Enteroaggregative E coli (EAEC) NOT DETECTED NOT DETECTED Final   Enteropathogenic E coli (EPEC) NOT DETECTED NOT DETECTED Final   Enterotoxigenic E coli (ETEC) NOT DETECTED NOT DETECTED Final   Shiga like toxin producing E coli (STEC) NOT DETECTED NOT DETECTED Final   Shigella/Enteroinvasive E coli (EIEC)  NOT DETECTED NOT DETECTED Final   Cryptosporidium NOT DETECTED NOT DETECTED Final   Cyclospora cayetanensis NOT DETECTED NOT DETECTED Final   Entamoeba histolytica NOT DETECTED NOT DETECTED Final   Giardia lamblia NOT DETECTED NOT DETECTED Final   Adenovirus F40/41 NOT DETECTED NOT DETECTED Final   Astrovirus NOT DETECTED NOT DETECTED Final   Norovirus GI/GII NOT DETECTED NOT DETECTED Final   Rotavirus A NOT DETECTED NOT DETECTED Final   Sapovirus (I, II, IV, and V) NOT DETECTED NOT DETECTED Final    Comment: Performed at [facility], [address] [place],  [place] [address]  C Difficile Quick Screen w PCR reflex     Status: None   Collection Time: 05/30/20  7:52 AM   Specimen: STOOL  Result Value Ref Range Status   C Diff antigen NEGATIVE NEGATIVE Final   C Diff toxin NEGATIVE NEGATIVE Final   C Diff interpretation No C. difficile detected.  Final    Comment: Performed at [facility], [place] [address] [place], [place] [address]         Radiology Studies: CT ABDOMEN PELVIS WO CONTRAST  Result Date: 05/30/2020 CLINICAL DATA:  Abdominal pain. EXAM: CT ABDOMEN AND PELVIS WITHOUT CONTRAST TECHNIQUE: Multidetector CT imaging of the abdomen and pelvis was performed following the standard protocol without IV contrast. COMPARISON:  CT dated November 19, 2019. FINDINGS: Lower chest: The lung bases are clear. The heart size is normal. Hepatobiliary: The liver is normal. Status post cholecystectomy.There is no biliary ductal dilation. Pancreas: Normal contours without ductal dilatation. No peripancreatic fluid collection. Spleen: Unremarkable. Adrenals/Urinary Tract: --Adrenal glands: Unremarkable. --Right kidney/ureter: No hydronephrosis or radiopaque kidney stones. --Left kidney/ureter: No hydronephrosis or radiopaque kidney stones. --Urinary bladder: Unremarkable. Stomach/Bowel: --Stomach/Duodenum: No hiatal hernia or other gastric abnormality. Normal duodenal course and caliber.  --Small bowel: Unremarkable. --Colon: Unremarkable. --Appendix: Normal. Vascular/Lymphatic: Normal course and caliber of the major abdominal vessels. --No retroperitoneal lymphadenopathy. --No mesenteric lymphadenopathy. --No pelvic or inguinal lymphadenopathy. Reproductive: Unremarkable Other: No ascites or free air. The abdominal wall is normal. Musculoskeletal. Multilevel degenerative changes are noted throughout the lumbar spine. Patient is status post prior bilateral total hip arthroplasty. There is chronic lucency about the femoral stem on the left which may indicate underlying loosening. IMPRESSION: No acute abnormality.  Chronic findings as detailed above. Electronically Signed   By: Constance Holster M.D.   On: 05/30/2020 00:00   US RENAL  Result Date: 05/29/2020 CLINICAL DATA:  Acute kidney injury. EXAM: RENAL / URINARY TRACT ULTRASOUND COMPLETE COMPARISON:  None. FINDINGS: Right Kidney: Renal measurements: 12.0 x 4.2 x 5.7 cm = volume: 150 mL. Echogenicity within normal limits. No mass or hydronephrosis visualized. Left Kidney: Renal measurements: 12.3 x 5.2 x 5.9 cm = volume: 197 mL. Echogenicity within normal limits. No mass or hydronephrosis visualized. Bladder: Appears normal for degree of bladder distention. Other: None. IMPRESSION: Unremarkable sonographic appearance of the kidneys and bladder. Electronically Signed   By: Keith Rake M.D.   On: 05/29/2020 20:48        Scheduled Meds: . enoxaparin (LOVENOX) injection  40 mg Subcutaneous Q24H  . escitalopram  20 mg Oral Daily  . pantoprazole (PROTONIX) IV  40 mg Intravenous Daily  . potassium chloride  40 mEq Oral Q4H  . pregabalin  75 mg Oral TID  . trimethobenzamide  300 mg Oral TID   Continuous Infusions:         Aline August, MD [facility] 05/31/2020, 10:02 AM

## 2020-06-01 LAB — BASIC METABOLIC PANEL
Anion gap: 7 (ref 5–15)
BUN: <5 mg/dL — ABNORMAL LOW (ref 6–20)
CO2: 29 mmol/L (ref 22–32)
Calcium: 9 mg/dL (ref 8.9–10.3)
Chloride: 100 mmol/L (ref 98–111)
Creatinine, Ser: 0.69 mg/dL (ref 0.44–1.00)
GFR calc Af Amer: >60 mL/min (ref >60)
GFR calc non Af Amer: >60 mL/min (ref >60)
Glucose, Bld: 95 mg/dL (ref 70–99)
Potassium: 3.5 mmol/L (ref 3.5–5.1)
Sodium: 136 mmol/L (ref 135–145)

## 2020-06-01 LAB — CBC WITH DIFFERENTIAL/PLATELET
Abs Immature Granulocytes: 0.02 10*3/uL (ref 0.00–0.07)
Basophils Absolute: 0.1 10*3/uL (ref 0.0–0.1)
Basophils Relative: 1 %
Eosinophils Absolute: 0.3 10*3/uL (ref 0.0–0.5)
Eosinophils Relative: 5 %
HCT: 38 % (ref 36.0–46.0)
Hemoglobin: 11.8 g/dL — ABNORMAL LOW (ref 12.0–15.0)
Immature Granulocytes: 0 %
Lymphocytes Relative: 28 %
Lymphs Abs: 1.8 10*3/uL (ref 0.7–4.0)
MCH: 27 pg (ref 26.0–34.0)
MCHC: 31.1 g/dL (ref 30.0–36.0)
MCV: 87 fL (ref 80.0–100.0)
Monocytes Absolute: 0.4 10*3/uL (ref 0.1–1.0)
Monocytes Relative: 7 %
Neutro Abs: 3.6 10*3/uL (ref 1.7–7.7)
Neutrophils Relative %: 59 %
Platelets: 279 10*3/uL (ref 150–400)
RBC: 4.37 MIL/uL (ref 3.87–5.11)
RDW: 16.7 % — ABNORMAL HIGH (ref 11.5–15.5)
WBC: 6.2 10*3/uL (ref 4.0–10.5)
nRBC: 0 % (ref 0.0–0.2)

## 2020-06-01 LAB — MAGNESIUM: Magnesium: 2.2 mg/dL (ref 1.7–2.4)

## 2020-06-01 MED ORDER — POTASSIUM CHLORIDE CRYS ER 20 MEQ PO TBCR
20.0000 meq | EXTENDED_RELEASE_TABLET | Freq: Once | ORAL | Status: AC
  Administered 2020-06-01: 20 meq via ORAL
  Filled 2020-06-01: qty 1

## 2020-06-01 MED ORDER — POTASSIUM CHLORIDE CRYS ER 20 MEQ PO TBCR
40.0000 meq | EXTENDED_RELEASE_TABLET | Freq: Once | ORAL | Status: AC
  Administered 2020-06-01: 40 meq via ORAL
  Filled 2020-06-01: qty 2

## 2020-06-01 NOTE — Progress Notes (Signed)
Patient ID: [name] [name], female   DOB: July 31, 1971, 49 y.o.   MRN: [id number]  PROGRESS NOTE    ADRAIN BUTRICK  YJE:[id number] DOB: [birth year] DOA: 05/29/2020 PCP: Robyne Peers, MD   Brief Narrative:  49 y.o. female with medical history significant of hypertension, HLD, chronic iron deficiency anemia, psoriatic arthritis, presented with new onset chest pain shortness breath and nausea/vomiting with diarrhea.  On presentation, troponin was negative x2; EKG with no acute ST changes.  Potassium was 2.7 and creatinine was 1.6, compared to baseline of 1.0.  She was started on IV fluids.  Assessment & Plan:   Hypokalemia Apparently this is a recurring problem.  Patient was in [facility] a few weeks ago for similar issues.  Current low potassium level could be due to diarrhea.  Aggressively repleted.  Noted to be 3.5 this morning.  Will give additional dose today.  Magnesium is 2.2 today.  Acute on chronic diarrhea Patient also had nausea vomiting.  Stool studies have been negative.  Continue Imodium.  Diarrhea is improving.  Patient had a colonoscopy about 6 months ago which showed normal colonic mucosa.  Outpatient follow-up with gastroenterology.  Acute kidney injury Likely from dehydration.  Improved with hydration.  Chest pain Noncardiac.  Likely related to nausea and vomiting.  Troponins were negative.  EKG was nonischemic.  Chest pain appears to have resolved.  Prolonged QTC Improving.  Noted to be normal on this morning's EKG.  Leukocytosis Likely reactive.  Improved.  Thrombocytosis Probably due to volume depletion.  Improved.  History of anxiety and depression Continue home medications. Patient is on multiple medications at home for her pain/depression including high doses of Lyrica, buprenorphine, tizanidine.  Recommended outpatient follow-up with PCP/pain management regarding de-escalation/reevaluation of these doses.   DVT prophylaxis: Lovenox Code  Status: Full Family Communication: Discussed with patient Disposition Plan: Hopefully return home when improved.  Status is: Inpatient  Remains inpatient appropriate because:Inpatient level of care appropriate due to severity of illness.  Still having diarrhea, only slightly improved.  Will need continued inpatient care.  Dispo: The patient is from: Home              Anticipated d/c is to: Home              Anticipated d/c date is: 1 day              Patient currently is not medically stable to d/c.   Consultants: None  Procedures: None  Antimicrobials: None   Subjective: Patient states that she continues to have loose stools but better than before.  Last 1 was around 5:00 this morning.  Abdominal pain has improved.  Overall she does feel better.    Objective: Vitals:   05/31/20 0337 05/31/20 1320 05/31/20 2025 06/01/20 0550  BP:  118/84 (!) 121/91 113/77  Pulse: 78 70 76 86  Resp: 12 17 15 16   Temp: 98.4 F (36.9 C) 98.5 F (36.9 C) 98.1 F (36.7 C) 97.8 F (36.6 C)  TempSrc: Oral Oral Oral Oral  SpO2: 98% 99% 99% 97%  Weight: 82.8 kg   87.3 kg  Height:        Intake/Output Summary (Last 24 hours) at 06/01/2020 1218 Last data filed at 05/31/2020 2225 Gross per 24 hour  Intake 444 ml  Output --  Net 444 ml   Filed Weights   05/29/20 1917 05/31/20 0337 06/01/20 0550  Weight: 83.1 kg 82.8 kg 87.3 kg  Examination:  General appearance: Awake alert.  In no distress Resp: Clear to auscultation bilaterally.  Normal effort Cardio: S1-S2 is normal regular.  No S3-S4.  No rubs murmurs or bruit GI: Abdomen is soft.  Nontender nondistended.  Bowel sounds are present normal.  No masses organomegaly Extremities: No edema.  Full range of motion of lower extremities. Neurologic: Alert and oriented x3.  No focal neurological deficits.     Data Reviewed: I have personally reviewed following labs and imaging studies  CBC: Recent Labs  Lab 05/29/20 0838 05/31/20 0136  06/01/20 0710  WBC 12.9* 9.0 6.2  NEUTROABS  --  5.7 3.6  HGB 15.2* 12.8 11.8*  HCT 46.3* 40.6 38.0  MCV 83.0 85.5 87.0  PLT 445* 301 732   Basic Metabolic Panel: Recent Labs  Lab 05/29/20 0838 05/29/20 0838 05/29/20 1358 05/29/20 1947 05/30/20 0144 05/30/20 1454 05/31/20 0136 06/01/20 0710  NA 134*  --  137  --  132*  --  134* 136  K 2.7*   < > 2.7*  --  2.0* 3.0* 3.1* 3.5  CL 86*  --  89*  --  86*  --  94* 100  CO2 28  --  30  --  31  --  27 29  GLUCOSE 140*  --  120*  --  114*  --  104* 95  BUN 19  --  18  --  22*  --  11 <5*  CREATININE 1.67*  --  1.61*  --  1.34*  --  0.74 0.69  CALCIUM 10.5*  --  9.4  --  9.1  --  9.2 9.0  MG  --   --   --  2.4  --   --  2.3 2.2  PHOS  --   --   --  5.0*  --   --   --   --    < > = values in this interval not displayed.   GFR: Estimated Creatinine Clearance: 91.9 mL/min (by C-G formula based on SCr of 0.69 mg/dL). Liver Function Tests: Recent Labs  Lab 05/29/20 1050  AST 32  ALT 25  ALKPHOS 107  BILITOT 1.1  PROT 8.0  ALBUMIN 4.5   Recent Labs  Lab 05/29/20 1050  LIPASE 24   Sepsis Labs: Recent Labs  Lab 05/29/20 1947 05/30/20 0144 05/31/20 0136  PROCALCITON <0.10 <0.10 <0.10    Recent Results (from the past 240 hour(s))  Respiratory Panel by RT PCR (Flu A&B, Covid) - Nasopharyngeal Swab     Status: None   Collection Time: 05/29/20  4:24 PM   Specimen: Nasopharyngeal Swab  Result Value Ref Range Status   SARS Coronavirus 2 by RT PCR NEGATIVE NEGATIVE Final    Comment: (NOTE) SARS-CoV-2 target nucleic acids are NOT DETECTED.  The SARS-CoV-2 RNA is generally detectable in upper respiratoy specimens during the acute phase of infection. The lowest concentration of SARS-CoV-2 viral copies this assay can detect is 131 copies/mL. A negative result does not preclude SARS-Cov-2 infection and should not be used as the sole basis for treatment or other patient management decisions. A negative result may occur with    improper specimen collection/handling, submission of specimen other than nasopharyngeal swab, presence of viral mutation(s) within the areas targeted by this assay, and inadequate number of viral copies (<131 copies/mL). A negative result must be combined with clinical observations, patient history, and epidemiological information. The expected result is Negative.  Fact Sheet for Patients:  PinkCheek.be  Fact Sheet for Healthcare Providers:  GravelBags.it  This test is no t yet approved or cleared by the [place] FDA and  has been authorized for detection and/or diagnosis of SARS-CoV-2 by FDA under an Emergency Use Authorization (EUA). This EUA will remain  in effect (meaning this test can be used) for the duration of the COVID-19 declaration under Section 564(b)(1) of the Act, 21 U.S.C. section 360bbb-3(b)(1), unless the authorization is terminated or revoked sooner.     Influenza A by PCR NEGATIVE NEGATIVE Final   Influenza B by PCR NEGATIVE NEGATIVE Final    Comment: (NOTE) The Xpert Xpress SARS-CoV-2/FLU/RSV assay is intended as an aid in  the diagnosis of influenza from Nasopharyngeal swab specimens and  should not be used as a sole basis for treatment. Nasal washings and  aspirates are unacceptable for Xpert Xpress SARS-CoV-2/FLU/RSV  testing.  Fact Sheet for Patients: PinkCheek.be  Fact Sheet for Healthcare Providers: GravelBags.it  This test is not yet approved or cleared by the [place] FDA and  has been authorized for detection and/or diagnosis of SARS-CoV-2 by  FDA under an Emergency Use Authorization (EUA). This EUA will remain  in effect (meaning this test can be used) for the duration of the  Covid-19 declaration under Section 564(b)(1) of the Act, 21  U.S.C. section 360bbb-3(b)(1), unless the authorization is  terminated or  revoked. Performed at [facility], [place] [address] [place], [place] [address]   Gastrointestinal Panel by PCR , Stool     Status: None   Collection Time: 05/30/20  7:52 AM   Specimen: STOOL  Result Value Ref Range Status   Campylobacter species NOT DETECTED NOT DETECTED Final   Plesimonas shigelloides NOT DETECTED NOT DETECTED Final   Salmonella species NOT DETECTED NOT DETECTED Final   Yersinia enterocolitica NOT DETECTED NOT DETECTED Final   Vibrio species NOT DETECTED NOT DETECTED Final   Vibrio cholerae NOT DETECTED NOT DETECTED Final   Enteroaggregative E coli (EAEC) NOT DETECTED NOT DETECTED Final   Enteropathogenic E coli (EPEC) NOT DETECTED NOT DETECTED Final   Enterotoxigenic E coli (ETEC) NOT DETECTED NOT DETECTED Final   Shiga like toxin producing E coli (STEC) NOT DETECTED NOT DETECTED Final   Shigella/Enteroinvasive E coli (EIEC) NOT DETECTED NOT DETECTED Final   Cryptosporidium NOT DETECTED NOT DETECTED Final   Cyclospora cayetanensis NOT DETECTED NOT DETECTED Final   Entamoeba histolytica NOT DETECTED NOT DETECTED Final   Giardia lamblia NOT DETECTED NOT DETECTED Final   Adenovirus F40/41 NOT DETECTED NOT DETECTED Final   Astrovirus NOT DETECTED NOT DETECTED Final   Norovirus GI/GII NOT DETECTED NOT DETECTED Final   Rotavirus A NOT DETECTED NOT DETECTED Final   Sapovirus (I, II, IV, and V) NOT DETECTED NOT DETECTED Final    Comment: Performed at [facility], [address] [place], [place] [address]  C Difficile Quick Screen w PCR reflex     Status: None   Collection Time: 05/30/20  7:52 AM   Specimen: STOOL  Result Value Ref Range Status   C Diff antigen NEGATIVE NEGATIVE Final   C Diff toxin NEGATIVE NEGATIVE Final   C Diff interpretation No C. difficile detected.  Final    Comment: Performed at [facility], [place] [address], [place], [place] [address]         Radiology Studies: No results found.      Scheduled Meds: .  enoxaparin (LOVENOX) injection  40 mg Subcutaneous Q24H  .  escitalopram  20 mg Oral Daily  . pantoprazole  40 mg Oral Daily  . pregabalin  75 mg Oral TID  . trimethobenzamide  300 mg Oral TID   Continuous Infusions:         Bonnielee Haff, MD [facility] 06/01/2020, 12:18 PM

## 2020-06-02 LAB — BASIC METABOLIC PANEL
Anion gap: 7 (ref 5–15)
BUN: 14 mg/dL (ref 6–20)
CO2: 27 mmol/L (ref 22–32)
Calcium: 8.5 mg/dL — ABNORMAL LOW (ref 8.9–10.3)
Chloride: 102 mmol/L (ref 98–111)
Creatinine, Ser: 0.63 mg/dL (ref 0.44–1.00)
GFR calc Af Amer: >60 mL/min (ref >60)
GFR calc non Af Amer: >60 mL/min (ref >60)
Glucose, Bld: 122 mg/dL — ABNORMAL HIGH (ref 70–99)
Potassium: 4.1 mmol/L (ref 3.5–5.1)
Sodium: 136 mmol/L (ref 135–145)

## 2020-06-02 LAB — CBC
HCT: 33.7 % — ABNORMAL LOW (ref 36.0–46.0)
Hemoglobin: 10.7 g/dL — ABNORMAL LOW (ref 12.0–15.0)
MCH: 28.3 pg (ref 26.0–34.0)
MCHC: 31.8 g/dL (ref 30.0–36.0)
MCV: 89.2 fL (ref 80.0–100.0)
Platelets: 266 10*3/uL (ref 150–400)
RBC: 3.78 MIL/uL — ABNORMAL LOW (ref 3.87–5.11)
RDW: 16.8 % — ABNORMAL HIGH (ref 11.5–15.5)
WBC: 7.6 10*3/uL (ref 4.0–10.5)
nRBC: 0 % (ref 0.0–0.2)

## 2020-06-02 LAB — MAGNESIUM: Magnesium: 1.9 mg/dL (ref 1.7–2.4)

## 2020-06-02 MED ORDER — LOPERAMIDE HCL 2 MG PO CAPS: 4.0000 mg | ORAL_CAPSULE | Freq: Three times a day (TID) | ORAL | 0 refills | Status: DC | PRN

## 2020-06-02 NOTE — Discharge Summary (Signed)
[facility]  Physician Discharge Summary   Patient ID: [name] [name] MRN: [id number] DOB/AGE: 12/12/1970 49 y.o.  Admit date: 05/29/2020 Discharge date: 06/02/2020  PCP: Robyne Peers, MD  DISCHARGE DIAGNOSES:  Hypokalemia Acute on chronic diarrhea, improved Acute kidney injury, resolved Noncardiac chest pain History of anxiety and depression  RECOMMENDATIONS FOR OUTPATIENT FOLLOW UP: 1. Outpatient follow-up with PCP 2. Patient instructed to call her gastroenterologist if diarrhea does not improve or if it gets worse.    Home Health: None Equipment/Devices: None  CODE STATUS: Full code  DISCHARGE CONDITION: fair  Diet recommendation: As before  INITIAL HISTORY: 49 y.o.femalewith medical history significant ofhypertension, HLD, chronic iron deficiency anemia, psoriatic arthritis, presented with new onset chest pain shortness breath and nausea/vomiting with diarrhea.  On presentation, troponin was negative x2; EKG with no acute ST changes.  Potassium was 2.7 and creatinine was 1.6, compared to baseline of 1.0.  She was started on IV fluids.   HOSPITAL COURSE:   Hypokalemia Apparently this is a recurring problem.  Patient was in [facility] a few weeks ago for similar issues.  Current low potassium level could have been due to diarrhea. She was aggressively supplemented.  Improved to 4.1 today.  Magnesium is normal.  She has potassium tablets at home which she takes daily.  Acute on chronic diarrhea Patient also had nausea vomiting.  Stool studies have been negative.    Patient was given Imodium with improvement in diarrhea.  Last episode was at 11:00 yesterday.  Frequency has decreased.  Denies any abdominal pain.  Has been tolerating her diet.  Patient had a colonoscopy about 6 months ago which showed normal colonic mucosa.  Outpatient follow-up with gastroenterology.  Follows with Dr. Simona Huh with [facility].  Acute kidney  injury Likely from dehydration.  Improved with hydration.  Chest pain Noncardiac.  Likely related to nausea and vomiting.  Troponins were negative.  EKG was nonischemic.  Chest pain appears to have resolved.  Prolonged QTC Likely due to electrolyte disturbances. Noted to be normal on subsequent EKG.  Leukocytosis Likely reactive.  Improved.  Thrombocytosis Probably due to volume depletion.  Improved.  History of anxiety and depression Continue home medications. Patient is on multiple medications at home for her pain/depression including high doses of Lyrica, buprenorphine, tizanidine.  Recommended outpatient follow-up with PCP/pain management regarding de-escalation/reevaluation of these doses.  Obesity Estimated body mass index is 34.33 kg/m as calculated from the following:   Height as of this encounter: 5\' 4"  (1.626 m).   Weight as of this encounter: 90.7 kg.   Overall stable.  Okay for discharge home today.   PERTINENT LABS:  The results of significant diagnostics from this hospitalization (including imaging, microbiology, ancillary and laboratory) are listed below for reference.    Microbiology: Recent Results (from the past 240 hour(s))  Respiratory Panel by RT PCR (Flu A&B, Covid) - Nasopharyngeal Swab     Status: None   Collection Time: 05/29/20  4:24 PM   Specimen: Nasopharyngeal Swab  Result Value Ref Range Status   SARS Coronavirus 2 by RT PCR NEGATIVE NEGATIVE Final    Comment: (NOTE) SARS-CoV-2 target nucleic acids are NOT DETECTED.  The SARS-CoV-2 RNA is generally detectable in upper respiratoy specimens during the acute phase of infection. The lowest concentration of SARS-CoV-2 viral copies this assay can detect is 131 copies/mL. A negative result does not preclude SARS-Cov-2 infection and should not be used as the sole basis for treatment or other  patient management decisions. A negative result may occur with  improper specimen  collection/handling, submission of specimen other than nasopharyngeal swab, presence of viral mutation(s) within the areas targeted by this assay, and inadequate number of viral copies (<131 copies/mL). A negative result must be combined with clinical observations, patient history, and epidemiological information. The expected result is Negative.  Fact Sheet for Patients:  PinkCheek.be  Fact Sheet for Healthcare Providers:  GravelBags.it  This test is no t yet approved or cleared by the [place] FDA and  has been authorized for detection and/or diagnosis of SARS-CoV-2 by FDA under an Emergency Use Authorization (EUA). This EUA will remain  in effect (meaning this test can be used) for the duration of the COVID-19 declaration under Section 564(b)(1) of the Act, 21 U.S.C. section 360bbb-3(b)(1), unless the authorization is terminated or revoked sooner.     Influenza A by PCR NEGATIVE NEGATIVE Final   Influenza B by PCR NEGATIVE NEGATIVE Final    Comment: (NOTE) The Xpert Xpress SARS-CoV-2/FLU/RSV assay is intended as an aid in  the diagnosis of influenza from Nasopharyngeal swab specimens and  should not be used as a sole basis for treatment. Nasal washings and  aspirates are unacceptable for Xpert Xpress SARS-CoV-2/FLU/RSV  testing.  Fact Sheet for Patients: PinkCheek.be  Fact Sheet for Healthcare Providers: GravelBags.it  This test is not yet approved or cleared by the [place] FDA and  has been authorized for detection and/or diagnosis of SARS-CoV-2 by  FDA under an Emergency Use Authorization (EUA). This EUA will remain  in effect (meaning this test can be used) for the duration of the  Covid-19 declaration under Section 564(b)(1) of the Act, 21  U.S.C. section 360bbb-3(b)(1), unless the authorization is  terminated or revoked. Performed at [facility], [place] [address], [place], [place] [address]   Gastrointestinal Panel by PCR , Stool     Status: None   Collection Time: 05/30/20  7:52 AM   Specimen: STOOL  Result Value Ref Range Status   Campylobacter species NOT DETECTED NOT DETECTED Final   Plesimonas shigelloides NOT DETECTED NOT DETECTED Final   Salmonella species NOT DETECTED NOT DETECTED Final   Yersinia enterocolitica NOT DETECTED NOT DETECTED Final   Vibrio species NOT DETECTED NOT DETECTED Final   Vibrio cholerae NOT DETECTED NOT DETECTED Final   Enteroaggregative E coli (EAEC) NOT DETECTED NOT DETECTED Final   Enteropathogenic E coli (EPEC) NOT DETECTED NOT DETECTED Final   Enterotoxigenic E coli (ETEC) NOT DETECTED NOT DETECTED Final   Shiga like toxin producing E coli (STEC) NOT DETECTED NOT DETECTED Final   Shigella/Enteroinvasive E coli (EIEC) NOT DETECTED NOT DETECTED Final   Cryptosporidium NOT DETECTED NOT DETECTED Final   Cyclospora cayetanensis NOT DETECTED NOT DETECTED Final   Entamoeba histolytica NOT DETECTED NOT DETECTED Final   Giardia lamblia NOT DETECTED NOT DETECTED Final   Adenovirus F40/41 NOT DETECTED NOT DETECTED Final   Astrovirus NOT DETECTED NOT DETECTED Final   Norovirus GI/GII NOT DETECTED NOT DETECTED Final   Rotavirus A NOT DETECTED NOT DETECTED Final   Sapovirus (I, II, IV, and V) NOT DETECTED NOT DETECTED Final    Comment: Performed at [facility], [address] [place], [place] [address]  C Difficile Quick Screen w PCR reflex     Status: None   Collection Time: 05/30/20  7:52 AM   Specimen: STOOL  Result Value Ref Range Status   C Diff antigen NEGATIVE NEGATIVE Final  C Diff toxin NEGATIVE NEGATIVE Final   C Diff interpretation No C. difficile detected.  Final    Comment: Performed at [facility], [place] [address], [place], [place] [address]     Labs:    Basic Metabolic Panel: Recent Labs  Lab 05/29/20 1358 05/29/20 1358 05/29/20 1947  05/30/20 0144 05/30/20 1454 05/31/20 0136 06/01/20 0710 06/02/20 0354  NA 137  --   --  132*  --  134* 136 136  K 2.7*   < >  --  2.0* 3.0* 3.1* 3.5 4.1  CL 89*  --   --  86*  --  94* 100 102  CO2 30  --   --  31  --  27 29 27   GLUCOSE 120*  --   --  114*  --  104* 95 122*  BUN 18  --   --  22*  --  11 <5* 14  CREATININE 1.61*  --   --  1.34*  --  0.74 0.69 0.63  CALCIUM 9.4  --   --  9.1  --  9.2 9.0 8.5*  MG  --   --  2.4  --   --  2.3 2.2 1.9  PHOS  --   --  5.0*  --   --   --   --   --    < > = values in this interval not displayed.   Liver Function Tests: Recent Labs  Lab 05/29/20 1050  AST 32  ALT 25  ALKPHOS 107  BILITOT 1.1  PROT 8.0  ALBUMIN 4.5   Recent Labs  Lab 05/29/20 1050  LIPASE 24   CBC: Recent Labs  Lab 05/29/20 0838 05/31/20 0136 06/01/20 0710 06/02/20 0354  WBC 12.9* 9.0 6.2 7.6  NEUTROABS  --  5.7 3.6  --   HGB 15.2* 12.8 11.8* 10.7*  HCT 46.3* 40.6 38.0 33.7*  MCV 83.0 85.5 87.0 89.2  PLT 445* 301 279 266   BNP: BNP (last 3 results) Recent Labs    08/12/19 1330 08/13/19 0421 08/14/19 0337  BNP 283.1* 536.7* 394.6*      IMAGING STUDIES CT ABDOMEN PELVIS WO CONTRAST  Result Date: 05/30/2020 CLINICAL DATA:  Abdominal pain. EXAM: CT ABDOMEN AND PELVIS WITHOUT CONTRAST TECHNIQUE: Multidetector CT imaging of the abdomen and pelvis was performed following the standard protocol without IV contrast. COMPARISON:  CT dated November 19, 2019. FINDINGS: Lower chest: The lung bases are clear. The heart size is normal. Hepatobiliary: The liver is normal. Status post cholecystectomy.There is no biliary ductal dilation. Pancreas: Normal contours without ductal dilatation. No peripancreatic fluid collection. Spleen: Unremarkable. Adrenals/Urinary Tract: --Adrenal glands: Unremarkable. --Right kidney/ureter: No hydronephrosis or radiopaque kidney stones. --Left kidney/ureter: No hydronephrosis or radiopaque kidney stones. --Urinary bladder: Unremarkable.  Stomach/Bowel: --Stomach/Duodenum: No hiatal hernia or other gastric abnormality. Normal duodenal course and caliber. --Small bowel: Unremarkable. --Colon: Unremarkable. --Appendix: Normal. Vascular/Lymphatic: Normal course and caliber of the major abdominal vessels. --No retroperitoneal lymphadenopathy. --No mesenteric lymphadenopathy. --No pelvic or inguinal lymphadenopathy. Reproductive: Unremarkable Other: No ascites or free air. The abdominal wall is normal. Musculoskeletal. Multilevel degenerative changes are noted throughout the lumbar spine. Patient is status post prior bilateral total hip arthroplasty. There is chronic lucency about the femoral stem on the left which may indicate underlying loosening. IMPRESSION: No acute abnormality.  Chronic findings as detailed above. Electronically Signed   By: Constance Holster M.D.   On: 05/30/2020 00:00   DG Chest 2 View  Result Date: 05/29/2020 CLINICAL DATA:  Chest pain, recently COVID positive EXAM: CHEST - 2 VIEW COMPARISON:  05/11/2020 FINDINGS: The heart size and mediastinal contours are within normal limits. Both lungs are clear. The visualized skeletal structures are unremarkable except for degenerative changes and associated scoliosis. Trachea midline. Right shoulder arthroplasty partially imaged. IMPRESSION: No active cardiopulmonary disease. Electronically Signed   By: Jerilynn Mages.  Shick M.D.   On: 05/29/2020 09:13   US RENAL  Result Date: 05/29/2020 CLINICAL DATA:  Acute kidney injury. EXAM: RENAL / URINARY TRACT ULTRASOUND COMPLETE COMPARISON:  None. FINDINGS: Right Kidney: Renal measurements: 12.0 x 4.2 x 5.7 cm = volume: 150 mL. Echogenicity within normal limits. No mass or hydronephrosis visualized. Left Kidney: Renal measurements: 12.3 x 5.2 x 5.9 cm = volume: 197 mL. Echogenicity within normal limits. No mass or hydronephrosis visualized. Bladder: Appears normal for degree of bladder distention. Other: None. IMPRESSION: Unremarkable sonographic  appearance of the kidneys and bladder. Electronically Signed   By: Keith Rake M.D.   On: 05/29/2020 20:48    DISCHARGE EXAMINATION: Vitals:   06/02/20 0404 06/02/20 0410 06/02/20 0800 06/02/20 1000  BP: 106/76  109/82 118/83  Pulse: 64 66 60 75  Resp:  14 16   Temp: 97.7 F (36.5 C)  98.2 F (36.8 C)   TempSrc: Oral  Oral   SpO2:  98% 98%   Weight:      Height:       General appearance: Awake alert.  In no distress Resp: Clear to auscultation bilaterally.  Normal effort Cardio: S1-S2 is normal regular.  No S3-S4.  No rubs murmurs or bruit GI: Abdomen is soft.  Nontender nondistended.  Bowel sounds are present normal.  No masses organomegaly    DISPOSITION: Home  Discharge Instructions    Call MD for:  difficulty breathing, headache or visual disturbances   Complete by: As directed    Call MD for:  extreme fatigue   Complete by: As directed    Call MD for:  persistant dizziness or light-headedness   Complete by: As directed    Call MD for:  persistant nausea and vomiting   Complete by: As directed    Call MD for:  severe uncontrolled pain   Complete by: As directed    Call MD for:  temperature >100.4   Complete by: As directed    Diet - low sodium heart healthy   Complete by: As directed    Discharge instructions   Complete by: As directed    Please take your medications as prescribed.  Please follow-up with your gastroenterologist if your diarrhea does not resolve or if it gets worse.  Please also talk to your primary care provider about cutting back on the doses of some of your psychiatric medications.  You were cared for by a hospitalist during your hospital stay. If you have any questions about your discharge medications or the care you received while you were in the hospital after you are discharged, you can call the unit and asked to speak with the hospitalist on call if the hospitalist that took care of you is not available. Once you are discharged, your  primary care physician will handle any further medical issues. Please note that NO REFILLS for any discharge medications will be authorized once you are discharged, as it is imperative that you return to your primary care physician (or establish a relationship with a primary care physician if you do not have one) for your aftercare needs  so that they can reassess your need for medications and monitor your lab values. If you do not have a primary care physician, you can call [phone] for a physician referral.   Increase activity slowly   Complete by: As directed         Allergies as of 06/02/2020      Reactions   Nsaids Other (See Comments)   stroke CVA    Benzoin Dermatitis, Other (See Comments)   Localized - Skin Red with Burning Sensation Skin turns red and gets inflamed      Medication List    TAKE these medications   aspirin 325 MG EC tablet Take 325 mg by mouth daily.   atorvastatin 40 MG tablet Commonly known as: LIPITOR Take 40 mg by mouth daily.   Belbuca 450 MCG Film Generic drug: Buprenorphine HCl Place 450 mcg inside cheek in the morning and at bedtime.   escitalopram 20 MG tablet Commonly known as: LEXAPRO Take 20 mg by mouth daily.   loperamide 2 MG capsule Commonly known as: IMODIUM Take 2 capsules (4 mg total) by mouth 3 (three) times daily as needed for diarrhea or loose stools.   losartan 100 MG tablet Commonly known as: COZAAR Take 100 mg by mouth daily.   ondansetron 4 MG disintegrating tablet Commonly known as: Zofran ODT Take 1 tablet (4 mg total) by mouth every 8 (eight) hours as needed for nausea or vomiting.   potassium chloride SA 20 MEQ tablet Commonly known as: KLOR-CON Take 40 mEq by mouth daily.   pregabalin 75 MG capsule Commonly known as: LYRICA Take 150 mg by mouth 3 (three) times daily.   tiZANidine 4 MG tablet Commonly known as: ZANAFLEX Take 4 mg by mouth every 8 (eight) hours as needed for muscle spasms.   traMADol 50 MG  tablet Commonly known as: Ultram Take 1 tablet (50 mg total) by mouth every 6 (six) hours as needed for severe pain.   traZODone 50 MG tablet Commonly known as: DESYREL Take 50 mg by mouth at bedtime.         Follow-up Information    Danis, Kirke Corin, MD Follow up.   Specialty: Gastroenterology Why: Please schedule appointment if diarrhea doesnt completely resolve or if it gets worse. Contact information: [place] [address] [phone]        Robyne Peers, MD. Schedule an appointment as soon as possible for a visit in 1 week(s).   Specialty: Family Medicine Contact information: [address] [place] [address] [phone]               TOTAL DISCHARGE TIME: 81 minutes  [facility] Pager on [email or web address]  06/02/2020, 12:20 PM

## 2020-06-30 ENCOUNTER — Telehealth: Payer: Self-pay | Admitting: *Deleted

## 2020-06-30 ENCOUNTER — Telehealth: Payer: Self-pay | Admitting: Hematology and Oncology

## 2020-06-30 NOTE — Telephone Encounter (Signed)
Scheduled appt per 10/28 sch msg - pt is aware of appt date and time.

## 2020-06-30 NOTE — Telephone Encounter (Signed)
Received call from patient. She states she is anemic again-she had labs done @ [facility] on 06/20/20.  She is requesting an appt with labs with Dr, Lorenso Courier as soon as she can get in. She states she is feeling weak and fatigued.  Scheduling message sent.  Reviewed lab results in [facility].  HGB on 06/20/20 was 11.6. No iron studies were done.  Dr. Lorenso Courier made aware.

## 2020-07-08 ENCOUNTER — Inpatient Hospital Stay: Payer: BC Managed Care – PPO | Attending: Hematology and Oncology | Admitting: Hematology and Oncology

## 2020-07-08 ENCOUNTER — Inpatient Hospital Stay: Payer: BC Managed Care – PPO

## 2020-07-08 ENCOUNTER — Other Ambulatory Visit: Payer: Self-pay | Admitting: Hematology and Oncology

## 2020-07-08 ENCOUNTER — Other Ambulatory Visit: Payer: Self-pay

## 2020-07-08 VITALS — BP 124/74 | HR 71 | Temp 98.9°F | Resp 18 | Ht 64.0 in | Wt 220.1 lb

## 2020-07-08 DIAGNOSIS — Z8673 Personal history of transient ischemic attack (TIA), and cerebral infarction without residual deficits: Secondary | ICD-10-CM | POA: Insufficient documentation

## 2020-07-08 DIAGNOSIS — D500 Iron deficiency anemia secondary to blood loss (chronic): Secondary | ICD-10-CM

## 2020-07-08 DIAGNOSIS — R718 Other abnormality of red blood cells: Secondary | ICD-10-CM

## 2020-07-08 DIAGNOSIS — K589 Irritable bowel syndrome without diarrhea: Secondary | ICD-10-CM | POA: Insufficient documentation

## 2020-07-08 DIAGNOSIS — Z79899 Other long term (current) drug therapy: Secondary | ICD-10-CM | POA: Insufficient documentation

## 2020-07-08 DIAGNOSIS — I10 Essential (primary) hypertension: Secondary | ICD-10-CM | POA: Diagnosis not present

## 2020-07-08 DIAGNOSIS — D509 Iron deficiency anemia, unspecified: Secondary | ICD-10-CM | POA: Insufficient documentation

## 2020-07-08 DIAGNOSIS — E785 Hyperlipidemia, unspecified: Secondary | ICD-10-CM | POA: Insufficient documentation

## 2020-07-08 DIAGNOSIS — D72829 Elevated white blood cell count, unspecified: Secondary | ICD-10-CM | POA: Diagnosis not present

## 2020-07-08 DIAGNOSIS — Z87891 Personal history of nicotine dependence: Secondary | ICD-10-CM | POA: Diagnosis not present

## 2020-07-08 DIAGNOSIS — F419 Anxiety disorder, unspecified: Secondary | ICD-10-CM | POA: Insufficient documentation

## 2020-07-08 DIAGNOSIS — L4050 Arthropathic psoriasis, unspecified: Secondary | ICD-10-CM | POA: Diagnosis not present

## 2020-07-08 DIAGNOSIS — Z7982 Long term (current) use of aspirin: Secondary | ICD-10-CM | POA: Diagnosis not present

## 2020-07-08 LAB — CBC WITH DIFFERENTIAL (CANCER CENTER ONLY)
Abs Immature Granulocytes: 0.07 10*3/uL (ref 0.00–0.07)
Basophils Absolute: 0.1 10*3/uL (ref 0.0–0.1)
Basophils Relative: 1 %
Eosinophils Absolute: 0.4 10*3/uL (ref 0.0–0.5)
Eosinophils Relative: 3 %
HCT: 34.1 % — ABNORMAL LOW (ref 36.0–46.0)
Hemoglobin: 11 g/dL — ABNORMAL LOW (ref 12.0–15.0)
Immature Granulocytes: 1 %
Lymphocytes Relative: 21 %
Lymphs Abs: 2.3 10*3/uL (ref 0.7–4.0)
MCH: 28.2 pg (ref 26.0–34.0)
MCHC: 32.3 g/dL (ref 30.0–36.0)
MCV: 87.4 fL (ref 80.0–100.0)
Monocytes Absolute: 0.8 10*3/uL (ref 0.1–1.0)
Monocytes Relative: 7 %
Neutro Abs: 7.6 10*3/uL (ref 1.7–7.7)
Neutrophils Relative %: 67 %
Platelet Count: 342 10*3/uL (ref 150–400)
RBC: 3.9 MIL/uL (ref 3.87–5.11)
RDW: 16.2 % — ABNORMAL HIGH (ref 11.5–15.5)
WBC Count: 11.1 10*3/uL — ABNORMAL HIGH (ref 4.0–10.5)
nRBC: 0 % (ref 0.0–0.2)

## 2020-07-08 LAB — CMP (CANCER CENTER ONLY)
ALT: 44 U/L (ref 0–44)
AST: 66 U/L — ABNORMAL HIGH (ref 15–41)
Albumin: 3.6 g/dL (ref 3.5–5.0)
Alkaline Phosphatase: 69 U/L (ref 38–126)
Anion gap: 7 (ref 5–15)
BUN: 12 mg/dL (ref 6–20)
CO2: 30 mmol/L (ref 22–32)
Calcium: 9 mg/dL (ref 8.9–10.3)
Chloride: 99 mmol/L (ref 98–111)
Creatinine: 0.68 mg/dL (ref 0.44–1.00)
GFR, Estimated: >60 mL/min (ref >60)
Glucose, Bld: 97 mg/dL (ref 70–99)
Potassium: 3.9 mmol/L (ref 3.5–5.1)
Sodium: 136 mmol/L (ref 135–145)
Total Bilirubin: 0.3 mg/dL (ref 0.3–1.2)
Total Protein: 6.4 g/dL — ABNORMAL LOW (ref 6.5–8.1)

## 2020-07-08 LAB — RETIC PANEL
Immature Retic Fract: 18.1 % — ABNORMAL HIGH (ref 2.3–15.9)
RBC.: 3.9 MIL/uL (ref 3.87–5.11)
Retic Count, Absolute: 78 10*3/uL (ref 19.0–186.0)
Retic Ct Pct: 2 % (ref 0.4–3.1)
Reticulocyte Hemoglobin: 32.9 pg (ref >27.9)

## 2020-07-08 NOTE — Progress Notes (Signed)
[place] [phone]   [phone]) [phone]  PROGRESS NOTE  Patient Care Team: Robyne Peers, MD as PCP - General (Family Medicine)  Hematological/Oncological History # Iron Deficiency Anemia 1) 11/03/2018: WBC 12.6 HB 9.8 plts 462 2) 01/15/2019: WBC 7 HB 10.2 plts 547 MCV 80, Sed rate 26 3) 01/23/2019-01/30/2019: Feraheme 510 mg IV D1 and D8 due to intolerance of PO iron 4) 03/18/2019: WBC 8.9 HB 13.5 plts 276. Ferritin 97. 5) 08/26/2019: Establish care with Dr. Lorenso Courier. WBC 14.7, Hgb 10.4, Plt 572. MCV 77.7. Iron 21, TIBC 395, Sat 5%, ferritin 20.  6) 11/18/2019: WBC 14.2, Hgb 15.3, MCV 88.9, Plt 521. Iron 88, TIBC 389, Sat 23%, Ferritin 85.  Interval History:  [name] [name] 49 y.o. female with medical history significant for iron deficiency anemia presents for a follow up visit. She was last seen on 11/18/2019. In the interim since her last visit she was admitted for electrolyte abnormalities due to diarrhea from 9/26-9/30/2021.   In the interim since her last visit [name] [name] notes that her periods have been irregular and occasionally bleeding consistently since our last visit.  She notes that her flow is daily but not always heavy.  She notes that her last 1 lasted approximately 2 to 2-1/2 weeks.  She notes that her psoriasis rash is currently better than it was previously.  Has been working with OB/GYN in effort to get her menstrual cycles under control.  She notes that her breathing is okay and her energy is good but that she has been using caffeine as a crutch to help with her low energy levels.  Otherwise denies having issues with fevers, chills, sweats, nausea, vomiting or diarrhea.  A full 10 point ROS is listed below.  MEDICAL HISTORY:  Past Medical History:  Diagnosis Date  . Anxiety   . Blood transfusion without reported diagnosis   . Coronary artery disease   . Hyperlipidemia   . Hypertension   . Iron deficiency anemia 01/15/2019  . 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 Psoriatic arthritis ([place])   . Stroke [facility]) 2014    SURGICAL HISTORY: Past Surgical History:  Procedure Laterality Date  . CARPAL TUNNEL RELEASE     B/L hand  . CHOLECYSTECTOMY    . FOOT SURGERY     with hardware   . HERNIA REPAIR    . KNEE ARTHROSCOPY Bilateral 08/2014   [facility] Dr. Len Childs  . TEE WITHOUT CARDIOVERSION  01/22/2012   Procedure: TRANSESOPHAGEAL ECHOCARDIOGRAM (TEE);  Surgeon: Lelon Perla, MD;  Location: [facility] ENDOSCOPY;  Service: Cardiovascular;  Laterality: N/A;  . TOTAL HIP ARTHROPLASTY Bilateral     SOCIAL HISTORY: Social History   Socioeconomic History  . Marital status: Married    Spouse name: Not on file  . Number of children: Not on file  . Years of education: Not on file  . Highest education level: Not on file  Occupational History  . Not on file  Tobacco Use  . Smoking status: Former Research scientist (life sciences)  . Smokeless tobacco: Never Used  . Tobacco comment: quit 10 years ago, smoked intermittently for few years. Less than 10 yrs total.  Vaping Use  . Vaping Use: Never used  Substance and Sexual Activity  . Alcohol use: Yes    Comment: occ- 1-2 drinks a week  . Drug use: No  . Sexual activity: Not on file  Other Topics Concern  .  Not on file  Social History Narrative   Lives in [place] with her husband.   Has 4 healthy kids.   Social Determinants of Health   Financial Resource Strain:   . Difficulty of Paying Living Expenses: Not on file  Food Insecurity:   . Worried About Charity fundraiser in the Last Year: Not on file  . Ran Out of Food in the Last Year: Not on file  Transportation Needs:   . Lack of Transportation (Medical): Not on file  . Lack of Transportation (Non-Medical): Not on file  Physical Activity:   . Days of Exercise per Week: Not on file  . Minutes of Exercise per Session: Not on file  Stress:   . Feeling of Stress :  Not on file  Social Connections:   . Frequency of Communication with Friends and Family: Not on file  . Frequency of Social Gatherings with Friends and Family: Not on file  . Attends Religious Services: Not on file  . Active Member of Clubs or Organizations: Not on file  . Attends Archivist Meetings: Not on file  . Marital Status: Not on file  Intimate Partner Violence:   . Fear of Current or Ex-Partner: Not on file  . Emotionally Abused: Not on file  . Physically Abused: Not on file  . Sexually Abused: Not on file    FAMILY HISTORY: Family History  Problem Relation Age of Onset  . Hypertension Mother   . Hypertension Father   . Colon cancer Maternal Grandfather   . Colon cancer Paternal Grandfather   . Colon polyps Neg Hx   . Esophageal cancer Neg Hx   . Rectal cancer Neg Hx   . Stomach cancer Neg Hx     ALLERGIES:  is allergic to nsaids and benzoin.  MEDICATIONS:  Current Outpatient Medications  Medication Sig Dispense Refill  . inFLIXimab (REMICADE IV) Inject into the vein.    . predniSONE (DELTASONE) 5 MG tablet Take by mouth.    Marland Kitchen aspirin 325 MG EC tablet Take 325 mg by mouth daily.     Marland Kitchen atorvastatin (LIPITOR) 40 MG tablet Take 40 mg by mouth daily.    Marland Kitchen escitalopram (LEXAPRO) 20 MG tablet Take 20 mg by mouth daily.    Marland Kitchen loperamide (IMODIUM) 2 MG capsule Take 2 capsules (4 mg total) by mouth 3 (three) times daily as needed for diarrhea or loose stools. 15 capsule 0  . loratadine (CLARITIN) 10 MG tablet Take by mouth.    . losartan (COZAAR) 100 MG tablet Take 100 mg by mouth daily.    . ondansetron (ZOFRAN ODT) 4 MG disintegrating tablet Take 1 tablet (4 mg total) by mouth every 8 (eight) hours as needed for nausea or vomiting. (Patient not taking: Reported on 07/08/2020) 20 tablet 0  . potassium chloride SA (KLOR-CON) 20 MEQ tablet Take 40 mEq by mouth daily.     . pregabalin (LYRICA) 75 MG capsule Take 150 mg by mouth 3 (three) times daily.     Marland Kitchen  tiZANidine (ZANAFLEX) 4 MG tablet Take 4 mg by mouth every 8 (eight) hours as needed for muscle spasms.  (Patient not taking: Reported on 07/08/2020)    . traMADol (ULTRAM) 50 MG tablet Take 1 tablet (50 mg total) by mouth every 6 (six) hours as needed for severe pain. 20 tablet 0  . traZODone (DESYREL) 50 MG tablet Take 50 mg by mouth at bedtime.     No current facility-administered  medications for this visit.    REVIEW OF SYSTEMS:   Constitutional: ( - ) fevers, ( - )  chills , ( - ) night sweats Eyes: ( - ) blurriness of vision, ( - ) double vision, ( - ) watery eyes Ears, nose, mouth, throat, and face: ( - ) mucositis, ( - ) sore throat Respiratory: ( - ) cough, ( - ) dyspnea, ( - ) wheezes Cardiovascular: ( - ) palpitation, ( - ) chest discomfort, ( - ) lower extremity swelling Gastrointestinal:  ( - ) nausea, ( - ) heartburn, ( - ) change in bowel habits Skin: ( - ) abnormal skin rashes Lymphatics: ( - ) new lymphadenopathy, ( - ) easy bruising Neurological: ( - ) numbness, ( - ) tingling, ( - ) new weaknesses Behavioral/Psych: ( - ) mood change, ( - ) new changes  All other systems were reviewed with the patient and are negative.  PHYSICAL EXAMINATION: ECOG PERFORMANCE STATUS: 1 - Symptomatic but completely ambulatory  Vitals:   07/08/20 1443  BP: 124/74  Pulse: 71  Resp: 18  Temp: 98.9 F (37.2 C)  SpO2: 99%   Filed Weights   07/08/20 1443  Weight: 220 lb 1.6 oz (99.8 kg)    GENERAL: well appearing middle aged Caucasian female in NAD  SKIN: skin color, texture, turgor are normal, no rashes or significant lesions EYES: conjunctiva are pink and non-injected, sclera clear LUNGS: clear to auscultation and percussion with normal breathing effort HEART: regular rate & rhythm and no murmurs and no lower extremity edema Musculoskeletal: no cyanosis of digits and no clubbing. Less evidence of psoriasis today.  PSYCH: alert & oriented x 3, fluent speech NEURO: no focal  motor/sensory deficits  LABORATORY DATA:  I have reviewed the data as listed  CBC Latest Ref Rng & Units 07/08/2020 06/02/2020 06/01/2020  WBC 4.0 - 10.5 K/uL 11.1(H) 7.6 6.2  Hemoglobin 12.0 - 15.0 g/dL 11.0(L) 10.7(L) 11.8(L)  Hematocrit 36 - 46 % 34.1(L) 33.7(L) 38.0  Platelets 150 - 400 K/uL 342 266 279    CMP Latest Ref Rng & Units 07/08/2020 06/02/2020 06/01/2020  Glucose 70 - 99 mg/dL 97 122(H) 95  BUN 6 - 20 mg/dL 12 14 <5(L)  Creatinine 0.44 - 1.00 mg/dL 0.68 0.63 0.69  Sodium 135 - 145 mmol/L 136 136 136  Potassium 3.5 - 5.1 mmol/L 3.9 4.1 3.5  Chloride 98 - 111 mmol/L 99 102 100  CO2 22 - 32 mmol/L 30 27 29   Calcium 8.9 - 10.3 mg/dL 9.0 8.5(L) 9.0  Total Protein 6.5 - 8.1 g/dL 6.4(L) - -  Total Bilirubin 0.3 - 1.2 mg/dL 0.3 - -  Alkaline Phos 38 - 126 U/L 69 - -  AST 15 - 41 U/L 66(H) - -  ALT 0 - 44 U/L 44 - -     RADIOGRAPHIC STUDIES: None relevant to review.   No results found.  ASSESSMENT & PLAN [name] [name] 49 y.o. female with medical history significant for iron deficiency anemia presents for a follow up visit.   After review the labs, the records, discussion with the patient the findings most consistent with iron deficiency anemia with now depleted iron stores.  The patient's hemoglobin has dropped down to 11.0 after having gone up to nearly 15 following her prior dose of IV iron in January of this year.  She endorses having GYN bleeding which is the most likely cause of her current drop in hemoglobin.  Prior GI evaluation  showed no source of bleeding and she does have psoriasis which may be contributing to background inflammation and poor GI absorption of the iron.  As such I would recommend that we proceed with IV Feraheme 510 mg q. 7 days x 2 doses in order to bolster her levels.  We will plan to have her return in approximately 6 weeks after last dose of IV iron to assess how she has responded.  #Iron Deficiency Anemia, improved --s/p IV feraheme  infusions on 09/15/2019 and 09/25/2019. --GI evaluation with EGD/colonoscopy on 11/04/2019 revealed no clear source for her iron deficiency --chronic inflammation from her difficult to control psoriasis coupled with her GYN bleeding is the most likely etiology of the iron deficiency --iron levels appear depleted, Hgb <12. There is indication for further IV iron therapy. Recommend 2 repeat doses of IV feraheme 510mg .  --encourage continued OB/GYN in the interim.  --RTC 3 months to recheck CBC and iron levels. Can return sooner if abnormality in the MPN workup  #Leukocytosis #Thrombocytosis, resolved --initially thought to be 2/2 to inflammation from her psoriasis  --etiology of the patient's iron deficiency is unclear after GI evaluation, most likely results from her GYN bleeding --if no clear etiology can be discerned from above labwork, then the cause is most likely chronic inflammation. --continue to monitor.   #Nausea --patient notes this is associated with a flair in her IBS symptoms --provide zofran 8mg  PO in clinic today   No orders of the defined types were placed in this encounter.   All questions were answered. The patient knows to call the clinic with any problems, questions or concerns.  A total of more than 30 minutes were spent on this encounter and over half of that time was spent on counseling and coordination of care as outlined above.   Ledell Peoples, MD Department of Hematology/Oncology [place] at [facility] Phone: [phone] Pager: [phone] Email: [email or web address]  07/08/2020 3:05 PM

## 2020-07-11 LAB — IRON AND TIBC
Iron: 24 ug/dL — ABNORMAL LOW (ref 41–142)
Saturation Ratios: 5 % — ABNORMAL LOW (ref 21–57)
TIBC: 431 ug/dL (ref 236–444)
UIBC: 408 ug/dL — ABNORMAL HIGH (ref 120–384)

## 2020-07-11 LAB — FERRITIN: Ferritin: 16 ng/mL (ref 11–307)

## 2020-07-25 ENCOUNTER — Telehealth: Payer: Self-pay | Admitting: *Deleted

## 2020-07-25 NOTE — Telephone Encounter (Signed)
Received call from patient asking to have her  Fereheme infusions moved up as she feels so fatigued. Currently, she is scheduled for 08/03/20 and 08/10/20. Advised that I would check with scheduling and call her back.  Scheduling message sent

## 2020-07-27 NOTE — Progress Notes (Signed)
.   Intravenous Iron Formulation Change  [name] [name] has insurance that requires a change in intravenous iron product from Feraheme to Venofer. Orders have been updated to reflect this change and scheduling message sent to adjust infusion appointments. Dr Lorenso Courier notified and agrees with the plan.  Allergies:  Allergies  Allergen Reactions  . Nsaids Other (See Comments)    stroke CVA   . Benzoin Dermatitis and Other (See Comments)    Localized - Skin Red with Burning Sensation Skin turns red and gets inflamed     The plan for iron therapy is as follows: Venofer 200 mg IVPB x 5 doses.   Wynona Neat, PharmD 07/27/2020

## 2020-07-30 ENCOUNTER — Inpatient Hospital Stay: Payer: BC Managed Care – PPO

## 2020-07-30 VITALS — BP 131/82 | HR 70 | Temp 98.4°F | Resp 16

## 2020-07-30 DIAGNOSIS — D509 Iron deficiency anemia, unspecified: Secondary | ICD-10-CM | POA: Diagnosis not present

## 2020-07-30 DIAGNOSIS — D508 Other iron deficiency anemias: Secondary | ICD-10-CM

## 2020-07-30 MED ORDER — SODIUM CHLORIDE 0.9 % IV SOLN
200.0000 mg | Freq: Once | INTRAVENOUS | Status: AC
  Administered 2020-07-30: 200 mg via INTRAVENOUS
  Filled 2020-07-30: qty 200

## 2020-07-30 MED ORDER — SODIUM CHLORIDE 0.9 % IV SOLN
Freq: Once | INTRAVENOUS | Status: AC
  Filled 2020-07-30: qty 250

## 2020-07-30 NOTE — Patient Instructions (Signed)

## 2020-07-30 NOTE — Progress Notes (Signed)
Patient tolerated treatment, vital signs stable, patient discharged in stable condition.

## 2020-08-03 ENCOUNTER — Inpatient Hospital Stay: Payer: BC Managed Care – PPO

## 2020-08-10 ENCOUNTER — Ambulatory Visit: Payer: BC Managed Care – PPO

## 2020-08-17 ENCOUNTER — Ambulatory Visit: Payer: BC Managed Care – PPO

## 2020-08-24 ENCOUNTER — Inpatient Hospital Stay: Payer: BC Managed Care – PPO | Attending: Hematology and Oncology

## 2020-08-24 ENCOUNTER — Other Ambulatory Visit: Payer: Self-pay

## 2020-08-24 VITALS — BP 111/85 | HR 70 | Temp 98.5°F | Resp 16

## 2020-08-24 DIAGNOSIS — D508 Other iron deficiency anemias: Secondary | ICD-10-CM

## 2020-08-24 DIAGNOSIS — D509 Iron deficiency anemia, unspecified: Secondary | ICD-10-CM | POA: Insufficient documentation

## 2020-08-24 MED ORDER — SODIUM CHLORIDE 0.9 % IV SOLN
200.0000 mg | Freq: Once | INTRAVENOUS | Status: AC
  Administered 2020-08-24: 09:00:00 200 mg via INTRAVENOUS
  Filled 2020-08-24: qty 200

## 2020-08-24 MED ORDER — SODIUM CHLORIDE 0.9 % IV SOLN
Freq: Once | INTRAVENOUS | Status: AC
  Filled 2020-08-24: qty 250

## 2020-08-24 NOTE — Patient Instructions (Signed)

## 2020-09-21 ENCOUNTER — Other Ambulatory Visit: Payer: BC Managed Care – PPO

## 2020-09-21 ENCOUNTER — Ambulatory Visit: Payer: BC Managed Care – PPO | Admitting: Hematology and Oncology

## 2020-09-21 ENCOUNTER — Other Ambulatory Visit: Payer: Self-pay | Admitting: Hematology and Oncology

## 2020-09-21 DIAGNOSIS — D500 Iron deficiency anemia secondary to blood loss (chronic): Secondary | ICD-10-CM

## 2020-11-23 ENCOUNTER — Telehealth: Payer: Self-pay | Admitting: *Deleted

## 2020-11-23 NOTE — Telephone Encounter (Signed)
Received call from patient requesting iron infusion. She states she had lab work done with her PCP. HGb is 12.8; iron saturation is 5, iron level 22, TIBC is 484; Transferrin is 346.  Advised that Dr. Lorenso Courier is not in the office this week. Advised that I would show him the lab results when he is here on Monday, 11/28/20 Advised that I cannot order her iron  when she asked again to get iron this week. She eventually voiced understanding.  Of note pt was a no show to her iron infusion appts in December 2-21 and a no show for her appt with Dr, Lorenso Courier in January 2022  Message sent to Dr. Lorenso Courier

## 2020-11-29 ENCOUNTER — Telehealth: Payer: Self-pay | Admitting: Hematology and Oncology

## 2020-11-29 NOTE — Telephone Encounter (Signed)
Scheduled appt per 3/29 sch msg. Pt aware.  

## 2020-12-06 ENCOUNTER — Inpatient Hospital Stay: Payer: BC Managed Care – PPO | Attending: Hematology & Oncology

## 2020-12-06 ENCOUNTER — Other Ambulatory Visit: Payer: Self-pay

## 2020-12-06 VITALS — BP 93/57 | HR 61 | Temp 98.7°F | Resp 18

## 2020-12-06 DIAGNOSIS — D508 Other iron deficiency anemias: Secondary | ICD-10-CM

## 2020-12-06 DIAGNOSIS — D500 Iron deficiency anemia secondary to blood loss (chronic): Secondary | ICD-10-CM | POA: Diagnosis present

## 2020-12-06 MED ORDER — SODIUM CHLORIDE 0.9 % IV SOLN
200.0000 mg | Freq: Once | INTRAVENOUS | Status: AC
  Administered 2020-12-06: 200 mg via INTRAVENOUS
  Filled 2020-12-06: qty 200

## 2020-12-06 MED ORDER — SODIUM CHLORIDE 0.9 % IV SOLN
Freq: Once | INTRAVENOUS | Status: AC
  Filled 2020-12-06: qty 250

## 2020-12-06 NOTE — Patient Instructions (Signed)

## 2020-12-14 ENCOUNTER — Ambulatory Visit: Payer: BC Managed Care – PPO

## 2020-12-14 ENCOUNTER — Other Ambulatory Visit: Payer: Self-pay

## 2020-12-14 ENCOUNTER — Inpatient Hospital Stay: Payer: BC Managed Care – PPO

## 2020-12-14 ENCOUNTER — Other Ambulatory Visit: Payer: BC Managed Care – PPO

## 2020-12-14 VITALS — BP 122/77 | HR 67 | Temp 97.8°F | Resp 18

## 2020-12-14 DIAGNOSIS — D508 Other iron deficiency anemias: Secondary | ICD-10-CM

## 2020-12-14 DIAGNOSIS — D500 Iron deficiency anemia secondary to blood loss (chronic): Secondary | ICD-10-CM | POA: Diagnosis not present

## 2020-12-14 MED ORDER — SODIUM CHLORIDE 0.9 % IV SOLN
200.0000 mg | Freq: Once | INTRAVENOUS | Status: AC
  Administered 2020-12-14: 200 mg via INTRAVENOUS
  Filled 2020-12-14: qty 200

## 2020-12-14 MED ORDER — SODIUM CHLORIDE 0.9 % IV SOLN
Freq: Once | INTRAVENOUS | Status: AC
  Filled 2020-12-14: qty 250

## 2020-12-14 NOTE — Patient Instructions (Signed)
Iron Dextran injection °What is this medicine? °IRON DEXTRAN (AHY ern DEX tran) is an iron complex. Iron is used to make healthy red blood cells, which carry oxygen and nutrients through the body. This medicine is used to treat people who cannot take iron by mouth and have low levels of iron in the blood. °This medicine may be used for other purposes; ask your health care provider or pharmacist if you have questions. °COMMON BRAND NAME(S): Dexferrum, INFeD °What should I tell my health care provider before I take this medicine? °They need to know if you have any of these conditions: °· anemia not caused by low iron levels °· heart disease °· high levels of iron in the blood °· kidney disease °· liver disease °· an unusual or allergic reaction to iron, other medicines, foods, dyes, or preservatives °· pregnant or trying to get pregnant °· breast-feeding °How should I use this medicine? °This medicine is for injection into a vein or a muscle. It is given by a health care professional in a hospital or clinic setting. °Talk to your pediatrician regarding the use of this medicine in children. While this drug may be prescribed for children as young as 4 months old for selected conditions, precautions do apply. °Overdosage: If you think you have taken too much of this medicine contact a poison control center or emergency room at once. °NOTE: This medicine is only for you. Do not share this medicine with others. °What if I miss a dose? °It is important not to miss your dose. Call your doctor or health care professional if you are unable to keep an appointment. °What may interact with this medicine? °Do not take this medicine with any of the following medications: °· deferoxamine °· dimercaprol °· other iron products °This medicine may also interact with the following medications: °· chloramphenicol °· deferasirox °This list may not describe all possible interactions. Give your health care provider a list of all the  medicines, herbs, non-prescription drugs, or dietary supplements you use. Also tell them if you smoke, drink alcohol, or use illegal drugs. Some items may interact with your medicine. °What should I watch for while using this medicine? °Visit your doctor or health care professional regularly. Tell your doctor if your symptoms do not start to get better or if they get worse. You may need blood work done while you are taking this medicine. °You may need to follow a special diet. Talk to your doctor. Foods that contain iron include: whole grains/cereals, dried fruits, beans, or peas, leafy green vegetables, and organ meats (liver, kidney). °Long-term use of this medicine may increase your risk of some cancers. Talk to your doctor about how to limit your risk. °What side effects may I notice from receiving this medicine? °Side effects that you should report to your doctor or health care professional as soon as possible: °· allergic reactions like skin rash, itching or hives, swelling of the face, lips, or tongue °· blue lips, nails, or skin °· breathing problems °· changes in blood pressure °· chest pain °· confusion °· fast, irregular heartbeat °· feeling faint or lightheaded, falls °· fever or chills °· flushing, sweating, or hot feelings °· joint or muscle aches or pains °· pain, tingling, numbness in the hands or feet °· seizures °· unusually weak or tired °Side effects that usually do not require medical attention (report to your doctor or health care professional if they continue or are bothersome): °· change in taste (metallic taste) °·   diarrhea °· headache °· irritation at site where injected °· nausea, vomiting °· stomach upset °This list may not describe all possible side effects. Call your doctor for medical advice about side effects. You may report side effects to FDA at 1-800-FDA-1088. °Where should I keep my medicine? °This drug is given in a hospital or clinic and will not be stored at home. °NOTE: This  sheet is a summary. It may not cover all possible information. If you have questions about this medicine, talk to your doctor, pharmacist, or health care provider. °© 2021 [employer] Standard (2008-01-06 16:59:50) ° °

## 2021-01-10 NOTE — Progress Notes (Signed)
[place] [phone]   [phone]  PROGRESS NOTE  Patient Care Team: Robyne Peers, MD as PCP - General (Family Medicine)  Hematological/Oncological History # Iron Deficiency Anemia 1) 11/03/2018: WBC 12.6 HB 9.8 plts 462 2) 01/15/2019: WBC 7 HB 10.2 plts 547 MCV 80, Sed rate 26 3) 01/23/2019-01/30/2019: Feraheme 510 mg IV D1 and D8 due to intolerance of PO iron 4) 03/18/2019: WBC 8.9 HB 13.5 plts 276. Ferritin 97. 5) 08/26/2019: Establish care with Dr. Lorenso Courier. WBC 14.7, Hgb 10.4, Plt 572. MCV 77.7. Iron 21, TIBC 395, Sat 5%, ferritin 20.  6) 11/18/2019: WBC 14.2, Hgb 15.3, MCV 88.9, Plt 521. Iron 88, TIBC 389, Sat 23%, Ferritin 85.  Interval History:  [name] [name] 50 y.o. female with medical history significant for iron deficiency anemia presents for a follow up visit. She was last seen on 07/08/2020. In the interim since her last visit she received 2 doses of IV iron in April 2022.  On exam today Mrs. Barkan notes she feels like she has good energy and had a boost in her symptoms after receiving the IV iron in April.  She notes she does not feel "perfectly normal" but is pleased with results of the last IV iron fusion.  She does not does have an OB/GYN provider and is currently working on this.  Her menstrual cycles remain irregular.  She has she is also doing her best to increase iron consumption in her diet.  She currently denies any lightheadedness, dizziness, or shortness of breath.  She denies any overt signs of bleeding, bruising, or dark stools.  Otherwise denies having issues with fevers, chills, sweats, nausea, vomiting or diarrhea.  A full 10 point ROS is listed below.  MEDICAL HISTORY:  Past Medical History:  Diagnosis Date  . Anxiety   . Blood transfusion without reported diagnosis   . Coronary artery disease   . Hyperlipidemia   . Hypertension   . Iron deficiency anemia 01/15/2019  . 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 Psoriatic arthritis ([place])   . Stroke [facility]) 2014    SURGICAL HISTORY: Past Surgical History:  Procedure Laterality Date  . CARPAL TUNNEL RELEASE     B/L hand  . CHOLECYSTECTOMY    . FOOT SURGERY     with hardware   . HERNIA REPAIR    . KNEE ARTHROSCOPY Bilateral 08/2014   [facility] Dr. Len Childs  . TEE WITHOUT CARDIOVERSION  01/22/2012   Procedure: TRANSESOPHAGEAL ECHOCARDIOGRAM (TEE);  Surgeon: Lelon Perla, MD;  Location: [facility] ENDOSCOPY;  Service: Cardiovascular;  Laterality: N/A;  . TOTAL HIP ARTHROPLASTY Bilateral     SOCIAL HISTORY: Social History   Socioeconomic History  . Marital status: Married    Spouse name: Not on file  . Number of children: Not on file  . Years of education: Not on file  . Highest education level: Not on file  Occupational History  . Not on file  Tobacco Use  . Smoking status: Former Research scientist (life sciences)  . Smokeless tobacco: Never Used  . Tobacco comment: quit 10 years ago, smoked intermittently for few years. Less than 10 yrs total.  Vaping Use  . Vaping Use: Never used  Substance and Sexual Activity  . Alcohol use: Yes    Comment: occ- 1-2 drinks a week  . Drug use: No  . Sexual activity: Not on file  Other Topics Concern  . Not  on file  Social History Narrative   Lives in [place] with her husband.   Has 4 healthy kids.   Social Determinants of Health   Financial Resource Strain: Not on file  Food Insecurity: Not on file  Transportation Needs: Not on file  Physical Activity: Not on file  Stress: Not on file  Social Connections: Not on file  Intimate Partner Violence: Not on file    FAMILY HISTORY: Family History  Problem Relation Age of Onset  . Hypertension Mother   . Hypertension Father   . Colon cancer Maternal Grandfather   . Colon cancer Paternal Grandfather   . Colon polyps Neg Hx   . Esophageal cancer Neg Hx   . Rectal cancer Neg Hx   . Stomach cancer Neg Hx      ALLERGIES:  is allergic to nsaids.  MEDICATIONS:  Current Outpatient Medications  Medication Sig Dispense Refill  . aspirin 325 MG EC tablet Take 325 mg by mouth daily.     Marland Kitchen atorvastatin (LIPITOR) 40 MG tablet Take 40 mg by mouth daily.    Marland Kitchen BELBUCA 450 MCG FILM Take by mouth.    . escitalopram (LEXAPRO) 20 MG tablet Take 20 mg by mouth daily.    Marland Kitchen inFLIXimab (REMICADE IV) Inject into the vein.    Marland Kitchen losartan (COZAAR) 100 MG tablet Take 100 mg by mouth daily.    . potassium chloride SA (KLOR-CON) 20 MEQ tablet Take 40 mEq by mouth daily.     . pregabalin (LYRICA) 75 MG capsule Take 150 mg by mouth 3 (three) times daily.     . traZODone (DESYREL) 50 MG tablet Take 50 mg by mouth at bedtime.     No current facility-administered medications for this visit.    REVIEW OF SYSTEMS:   Constitutional: ( - ) fevers, ( - )  chills , ( - ) night sweats Eyes: ( - ) blurriness of vision, ( - ) double vision, ( - ) watery eyes Ears, nose, mouth, throat, and face: ( - ) mucositis, ( - ) sore throat Respiratory: ( - ) cough, ( - ) dyspnea, ( - ) wheezes Cardiovascular: ( - ) palpitation, ( - ) chest discomfort, ( - ) lower extremity swelling Gastrointestinal:  ( - ) nausea, ( - ) heartburn, ( - ) change in bowel habits Skin: ( - ) abnormal skin rashes Lymphatics: ( - ) new lymphadenopathy, ( - ) easy bruising Neurological: ( - ) numbness, ( - ) tingling, ( - ) new weaknesses Behavioral/Psych: ( - ) mood change, ( - ) new changes  All other systems were reviewed with the patient and are negative.  PHYSICAL EXAMINATION: ECOG PERFORMANCE STATUS: 1 - Symptomatic but completely ambulatory  Vitals:   01/11/21 0931  BP: (!) 113/54  Pulse: 68  Resp: 20  Temp: 97.8 F (36.6 C)  SpO2: 96%   Filed Weights   01/11/21 0931  Weight: 221 lb 1.6 oz (100.3 kg)    GENERAL: well appearing middle aged Caucasian female in NAD  SKIN: skin color, texture, turgor are normal, no rashes or significant  lesions EYES: conjunctiva are pink and non-injected, sclera clear LUNGS: clear to auscultation and percussion with normal breathing effort HEART: regular rate & rhythm and no murmurs and no lower extremity edema Musculoskeletal: no cyanosis of digits and no clubbing. Less evidence of psoriasis today.  PSYCH: alert & oriented x 3, fluent speech NEURO: no focal motor/sensory deficits  LABORATORY DATA:  I have reviewed the data as listed  CBC Latest Ref Rng & Units 01/11/2021 07/08/2020 06/02/2020  WBC 4.0 - 10.5 K/uL 7.5 11.1(H) 7.6  Hemoglobin 12.0 - 15.0 g/dL 11.9(L) 11.0(L) 10.7(L)  Hematocrit 36.0 - 46.0 % 36.6 34.1(L) 33.7(L)  Platelets 150 - 400 K/uL 266 342 266    CMP Latest Ref Rng & Units 01/11/2021 07/08/2020 06/02/2020  Glucose 70 - 99 mg/dL 91 97 122(H)  BUN 6 - 20 mg/dL 10 12 14   Creatinine 0.44 - 1.00 mg/dL 0.61 0.68 0.63  Sodium 135 - 145 mmol/L 137 136 136  Potassium 3.5 - 5.1 mmol/L 4.2 3.9 4.1  Chloride 98 - 111 mmol/L 100 99 102  CO2 22 - 32 mmol/L 28 30 27   Calcium 8.9 - 10.3 mg/dL 8.7(L) 9.0 8.5(L)  Total Protein 6.5 - 8.1 g/dL 6.7 6.4(L) -  Total Bilirubin 0.3 - 1.2 mg/dL 0.4 0.3 -  Alkaline Phos 38 - 126 U/L 101 69 -  AST 15 - 41 U/L 123(H) 66(H) -  ALT 0 - 44 U/L 92(H) 44 -     RADIOGRAPHIC STUDIES: None relevant to review.   No results found.  ASSESSMENT & PLAN [name] [name] 50 y.o. female with medical history significant for iron deficiency anemia presents for a follow up visit.   After review the labs, the records, discussion with the patient the findings most consistent with iron deficiency anemia with now depleted iron stores.  The patient's hemoglobin has dropped down to 11.0 after having gone up to nearly 15 following her prior dose of IV iron in January of this year.  She endorses having GYN bleeding which is the most likely cause of her current drop in hemoglobin.  Prior GI evaluation showed no source of bleeding and she does have psoriasis  which may be contributing to background inflammation and poor GI absorption of the iron.  As such I would recommend that we proceed with IV Feraheme 510 mg q. 7 days x 2 doses in order to bolster her levels.  We will plan to have her return in approximately 6 weeks after last dose of IV iron to assess how she has responded.  #Iron Deficiency Anemia, improved --s/p IV feraheme infusions on 12/06/2020 and 12/14/2020 --GI evaluation with EGD/colonoscopy on 11/04/2019 revealed no clear source for her iron deficiency --chronic inflammation from her difficult to control psoriasis coupled with her GYN bleeding is the most likely etiology of the iron deficiency --if iron levels appear depleted, recommend 2 repeat doses of IV feraheme 510mg .  --encourage patient to establish with OB/GYN in the interim.  --RTC 3 months to recheck CBC and iron levels. Can return sooner if abnormality in the MPN workup  #Leukocytosis #Thrombocytosis, resolved --initially thought to be 2/2 to inflammation from her psoriasis  --etiology of the patient's iron deficiency is unclear after GI evaluation, most likely results from her GYN bleeding --if no clear etiology can be discerned from above labwork, then the cause is most likely chronic inflammation. --continue to monitor.   #Nausea --patient notes this is associated with a flair in her IBS symptoms  No orders of the defined types were placed in this encounter.   All questions were answered. The patient knows to call the clinic with any problems, questions or concerns.  A total of more than 30 minutes were spent on this encounter and over half of that time was spent on counseling and coordination of care as outlined above.   Ledell Peoples,  MD Department of Hematology/Oncology [place] at [facility] Phone: [phone] Pager: [phone] Email: [email or web address]  01/11/2021 10:21 AM

## 2021-01-11 ENCOUNTER — Inpatient Hospital Stay: Payer: BC Managed Care – PPO | Admitting: Hematology and Oncology

## 2021-01-11 ENCOUNTER — Other Ambulatory Visit: Payer: Self-pay

## 2021-01-11 ENCOUNTER — Inpatient Hospital Stay: Payer: BC Managed Care – PPO | Attending: Hematology & Oncology

## 2021-01-11 VITALS — BP 113/54 | HR 68 | Temp 97.8°F | Resp 20 | Ht 64.0 in | Wt 221.1 lb

## 2021-01-11 DIAGNOSIS — R718 Other abnormality of red blood cells: Secondary | ICD-10-CM

## 2021-01-11 DIAGNOSIS — I2510 Atherosclerotic heart disease of native coronary artery without angina pectoris: Secondary | ICD-10-CM | POA: Insufficient documentation

## 2021-01-11 DIAGNOSIS — D500 Iron deficiency anemia secondary to blood loss (chronic): Secondary | ICD-10-CM | POA: Diagnosis not present

## 2021-01-11 DIAGNOSIS — R110 Nausea: Secondary | ICD-10-CM | POA: Diagnosis not present

## 2021-01-11 DIAGNOSIS — Z7982 Long term (current) use of aspirin: Secondary | ICD-10-CM | POA: Diagnosis not present

## 2021-01-11 DIAGNOSIS — L409 Psoriasis, unspecified: Secondary | ICD-10-CM | POA: Diagnosis not present

## 2021-01-11 DIAGNOSIS — D72829 Elevated white blood cell count, unspecified: Secondary | ICD-10-CM | POA: Diagnosis not present

## 2021-01-11 DIAGNOSIS — Z79899 Other long term (current) drug therapy: Secondary | ICD-10-CM | POA: Insufficient documentation

## 2021-01-11 DIAGNOSIS — D509 Iron deficiency anemia, unspecified: Secondary | ICD-10-CM | POA: Diagnosis not present

## 2021-01-11 DIAGNOSIS — I10 Essential (primary) hypertension: Secondary | ICD-10-CM | POA: Insufficient documentation

## 2021-01-11 DIAGNOSIS — K589 Irritable bowel syndrome without diarrhea: Secondary | ICD-10-CM | POA: Diagnosis not present

## 2021-01-11 LAB — CBC WITH DIFFERENTIAL (CANCER CENTER ONLY)
Abs Immature Granulocytes: 0.03 10*3/uL (ref 0.00–0.07)
Basophils Absolute: 0 10*3/uL (ref 0.0–0.1)
Basophils Relative: 1 %
Eosinophils Absolute: 0.3 10*3/uL (ref 0.0–0.5)
Eosinophils Relative: 4 %
HCT: 36.6 % (ref 36.0–46.0)
Hemoglobin: 11.9 g/dL — ABNORMAL LOW (ref 12.0–15.0)
Immature Granulocytes: 0 %
Lymphocytes Relative: 17 %
Lymphs Abs: 1.2 10*3/uL (ref 0.7–4.0)
MCH: 29.2 pg (ref 26.0–34.0)
MCHC: 32.5 g/dL (ref 30.0–36.0)
MCV: 89.7 fL (ref 80.0–100.0)
Monocytes Absolute: 0.7 10*3/uL (ref 0.1–1.0)
Monocytes Relative: 9 %
Neutro Abs: 5.2 10*3/uL (ref 1.7–7.7)
Neutrophils Relative %: 69 %
Platelet Count: 266 10*3/uL (ref 150–400)
RBC: 4.08 MIL/uL (ref 3.87–5.11)
RDW: 14.5 % (ref 11.5–15.5)
WBC Count: 7.5 10*3/uL (ref 4.0–10.5)
nRBC: 0 % (ref 0.0–0.2)

## 2021-01-11 LAB — IRON AND TIBC
Iron: 38 ug/dL — ABNORMAL LOW (ref 41–142)
Saturation Ratios: 10 % — ABNORMAL LOW (ref 21–57)
TIBC: 366 ug/dL (ref 236–444)
UIBC: 328 ug/dL (ref 120–384)

## 2021-01-11 LAB — RETIC PANEL
Immature Retic Fract: 16.7 % — ABNORMAL HIGH (ref 2.3–15.9)
RBC.: 4.12 MIL/uL (ref 3.87–5.11)
Retic Count, Absolute: 45.3 10*3/uL (ref 19.0–186.0)
Retic Ct Pct: 1.1 % (ref 0.4–3.1)
Reticulocyte Hemoglobin: 29.5 pg (ref >27.9)

## 2021-01-11 LAB — CMP (CANCER CENTER ONLY)
ALT: 92 U/L — ABNORMAL HIGH (ref 0–44)
AST: 123 U/L — ABNORMAL HIGH (ref 15–41)
Albumin: 3.7 g/dL (ref 3.5–5.0)
Alkaline Phosphatase: 101 U/L (ref 38–126)
Anion gap: 9 (ref 5–15)
BUN: 10 mg/dL (ref 6–20)
CO2: 28 mmol/L (ref 22–32)
Calcium: 8.7 mg/dL — ABNORMAL LOW (ref 8.9–10.3)
Chloride: 100 mmol/L (ref 98–111)
Creatinine: 0.61 mg/dL (ref 0.44–1.00)
GFR, Estimated: >60 mL/min (ref >60)
Glucose, Bld: 91 mg/dL (ref 70–99)
Potassium: 4.2 mmol/L (ref 3.5–5.1)
Sodium: 137 mmol/L (ref 135–145)
Total Bilirubin: 0.4 mg/dL (ref 0.3–1.2)
Total Protein: 6.7 g/dL (ref 6.5–8.1)

## 2021-01-11 LAB — FERRITIN: Ferritin: 45 ng/mL (ref 11–307)

## 2021-01-12 ENCOUNTER — Telehealth: Payer: Self-pay | Admitting: Hematology and Oncology

## 2021-01-12 NOTE — Telephone Encounter (Signed)
Scheduled per los. Called and spoke with patient. Confirmed appt 

## 2021-02-18 ENCOUNTER — Other Ambulatory Visit: Payer: Self-pay

## 2021-02-18 ENCOUNTER — Emergency Department (HOSPITAL_COMMUNITY): Payer: BC Managed Care – PPO

## 2021-02-18 ENCOUNTER — Emergency Department (HOSPITAL_COMMUNITY)
Admission: EM | Admit: 2021-02-18 | Discharge: 2021-02-19 | Disposition: A | Discharge status: Home / Self Care | Payer: BC Managed Care – PPO | Attending: Emergency Medicine | Admitting: Emergency Medicine

## 2021-02-18 DIAGNOSIS — Z96643 Presence of artificial hip joint, bilateral: Secondary | ICD-10-CM | POA: Diagnosis not present

## 2021-02-18 DIAGNOSIS — I2510 Atherosclerotic heart disease of native coronary artery without angina pectoris: Secondary | ICD-10-CM | POA: Diagnosis not present

## 2021-02-18 DIAGNOSIS — I10 Essential (primary) hypertension: Secondary | ICD-10-CM | POA: Insufficient documentation

## 2021-02-18 DIAGNOSIS — Z87891 Personal history of nicotine dependence: Secondary | ICD-10-CM | POA: Insufficient documentation

## 2021-02-18 DIAGNOSIS — R202 Paresthesia of skin: Secondary | ICD-10-CM | POA: Insufficient documentation

## 2021-02-18 DIAGNOSIS — Z7982 Long term (current) use of aspirin: Secondary | ICD-10-CM | POA: Insufficient documentation

## 2021-02-18 DIAGNOSIS — Z79899 Other long term (current) drug therapy: Secondary | ICD-10-CM | POA: Insufficient documentation

## 2021-02-18 DIAGNOSIS — R42 Dizziness and giddiness: Secondary | ICD-10-CM | POA: Insufficient documentation

## 2021-02-18 LAB — COMPREHENSIVE METABOLIC PANEL
ALT: 23 U/L (ref 0–44)
AST: 15 U/L (ref 15–41)
Albumin: 4.3 g/dL (ref 3.5–5.0)
Alkaline Phosphatase: 106 U/L (ref 38–126)
Anion gap: 11 (ref 5–15)
BUN: 10 mg/dL (ref 6–20)
CO2: 24 mmol/L (ref 22–32)
Calcium: 9 mg/dL (ref 8.9–10.3)
Chloride: 100 mmol/L (ref 98–111)
Creatinine, Ser: 0.72 mg/dL (ref 0.44–1.00)
GFR, Estimated: >60 mL/min (ref >60)
Glucose, Bld: 111 mg/dL — ABNORMAL HIGH (ref 70–99)
Potassium: 3.6 mmol/L (ref 3.5–5.1)
Sodium: 135 mmol/L (ref 135–145)
Total Bilirubin: 0.8 mg/dL (ref 0.3–1.2)
Total Protein: 7.1 g/dL (ref 6.5–8.1)

## 2021-02-18 LAB — URINALYSIS, ROUTINE W REFLEX MICROSCOPIC
Bacteria, UA: NONE SEEN
Glucose, UA: NEGATIVE mg/dL
Ketones, ur: 20 mg/dL — AB
Leukocytes,Ua: NEGATIVE
Nitrite: NEGATIVE
Protein, ur: 100 mg/dL — AB
Specific Gravity, Urine: 1.03 (ref 1.005–1.030)
pH: 5 (ref 5.0–8.0)

## 2021-02-18 LAB — CBC WITH DIFFERENTIAL/PLATELET
Abs Immature Granulocytes: 0.05 10*3/uL (ref 0.00–0.07)
Basophils Absolute: 0.1 10*3/uL (ref 0.0–0.1)
Basophils Relative: 1 %
Eosinophils Absolute: 0.1 10*3/uL (ref 0.0–0.5)
Eosinophils Relative: 1 %
HCT: 44.8 % (ref 36.0–46.0)
Hemoglobin: 14.8 g/dL (ref 12.0–15.0)
Immature Granulocytes: 1 %
Lymphocytes Relative: 19 %
Lymphs Abs: 1.8 10*3/uL (ref 0.7–4.0)
MCH: 29.4 pg (ref 26.0–34.0)
MCHC: 33 g/dL (ref 30.0–36.0)
MCV: 88.9 fL (ref 80.0–100.0)
Monocytes Absolute: 0.5 10*3/uL (ref 0.1–1.0)
Monocytes Relative: 5 %
Neutro Abs: 7.4 10*3/uL (ref 1.7–7.7)
Neutrophils Relative %: 73 %
Platelets: 379 10*3/uL (ref 150–400)
RBC: 5.04 MIL/uL (ref 3.87–5.11)
RDW: 14.5 % (ref 11.5–15.5)
WBC: 9.8 10*3/uL (ref 4.0–10.5)
nRBC: 0 % (ref 0.0–0.2)

## 2021-02-18 LAB — MAGNESIUM: Magnesium: 2 mg/dL (ref 1.7–2.4)

## 2021-02-18 MED ORDER — PROCHLORPERAZINE MALEATE 10 MG PO TABS
10.0000 mg | ORAL_TABLET | Freq: Once | ORAL | Status: AC
  Administered 2021-02-18: 10 mg via ORAL
  Filled 2021-02-18: qty 1

## 2021-02-18 MED ORDER — PROCHLORPERAZINE EDISYLATE 10 MG/2ML IJ SOLN: 10.0000 mg | Freq: Once | INTRAMUSCULAR | Status: DC

## 2021-02-18 MED ORDER — ACETAMINOPHEN 325 MG PO TABS
650.0000 mg | ORAL_TABLET | Freq: Once | ORAL | Status: AC
  Administered 2021-02-18: 650 mg via ORAL
  Filled 2021-02-18: qty 2

## 2021-02-18 NOTE — ED Provider Notes (Signed)
Emergency Medicine Provider Triage Evaluation Note  KARLI WICKIZER , a 50 y.o. female  was evaluated in triage.  Pt complains of dizziness (light headed) and nausea, went to UC yesterday, given Zofran, reports arms and legs feel heavy.  Review of Systems  Positive: Light headed, nausea, arms and legs feel heavy Negative: Weakness, changes in speech, vision, gait  Physical Exam  BP (!) 122/91 (BP Location: Left Arm)   Pulse 69   Temp 98.7 F (37.1 C) (Oral)   Resp 18   SpO2 100%  Gen:   Awake, no distress   Resp:  Hyperventilating  MSK:   Moves extremities without difficulty, equal arm and leg strength, symmetric facial movements  Other:    Medical Decision Making  Medically screening exam initiated at 1:26 PM.  Appropriate orders placed.  SUSANN LAWHORNE was informed that the remainder of the evaluation will be completed by another provider, this initial triage assessment does not replace that evaluation, and the importance of remaining in the ED until their evaluation is complete.     Tacy Learn, PA-C 02/18/21 1334    Luna Fuse, MD 02/18/21 2019

## 2021-02-18 NOTE — ED Notes (Signed)
Patient transported to MRI 

## 2021-02-18 NOTE — ED Provider Notes (Signed)
[facility] DEPT Provider Note   CSN: [id number] Arrival date & time: 02/18/21  1313     History Chief Complaint  Patient presents with   bilateral arm/leg heaviness   Dizziness   Nausea    [name] [name] is a 50 y.o. female.  Presents chief complaint of sensation of heaviness to her bilateral arms bilateral legs and a sensation of dizziness and what she describes as "heaviness" in her head.  She states symptoms began about 2 days ago and have persisted.  She is concerned because she states it feels similar to prior stroke she is had several years ago.  She otherwise denies any fevers or cough.  No vomiting or diarrhea.  No complaints of pain at this time.       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[place]): Hx of s/p revision fem head and linear exachange 05/19/2019 08/14/2019   Psoriatic arthritis ([place])    Stroke [facility]) 2014    Patient Active Problem List   Diagnosis Date Noted   Diarrhea 62/95/2841   Metabolic acidosis 32/44/0102   Hyponatremia 05/12/2020   Intractable nausea and vomiting 01/24/2020   Prolonged QT interval 01/24/2020   Chronic pain 01/24/2020   Nausea and vomiting 11/20/2019   Syncope 11/20/2019   Dehydration 11/20/2019   Transaminitis 11/20/2019   Lactic acidosis 11/20/2019   Acute hyponatremia 11/20/2019   AKI (acute kidney injury) ([place]) 11/19/2019   Bone spur of right foot 09/01/2019   Pain from implanted hardware 09/01/2019   Acquired pes planovalgus of right foot 09/01/2019   Person under investigation for COVID-19    Acute respiratory failure with hypoxia ([place]) 08/12/2019   COVID-19 virus infection: Presumed 08/12/2019   Community acquired pneumonia 08/11/2019   Multifocal pneumonia 08/10/2019   Iron deficiency  anemia 01/15/2019   B12 deficiency 01/01/2019   Immunosuppressed status ([place]) 12/31/2018   Myoclonic jerking 12/29/2018   Generalized anxiety disorder 08/04/2018   Nontraumatic complete tear of right rotator cuff 07/08/2018   Obesity with serious comorbidity 01/17/2018   Normochromic normocytic anemia 12/24/2017   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[place]) 11/11/2017   Primary osteoarthritis of right foot 04/08/2017   Right leg swelling 02/21/2017   Asthmatic bronchitis with acute exacerbation 02/15/2017   Noncompliance with treatment plan 12/27/2016   Synovitis of right foot 12/04/2016   Ganglion of foot, right 11/09/2016   Hypokalemia 10/12/2016   Hyperglycemia 10/11/2016   IFG (impaired fasting glucose) 10/11/2016   Lumbar trigger point syndrome 03/13/2016   Pap smear for cervical cancer screening 02/17/2016   Elevated liver enzymes 01/27/2016   Long term (current) use of antibiotics 01/27/2016   Cellulitis of left lower limb 01/03/2016   Wound dehiscence 01/03/2016   Loose total hip arthroplasty ([place]) 12/20/2015   Foot pain, right 09/09/2015   Screening for breast cancer 02/16/2015   Left axillary hidradenitis 02/16/2015   Epidermal inclusion cyst 01/11/2015   Urticaria 06/22/2014   Abnormal finding of blood chemistry 02/23/2014   Benign essential hypertension 02/23/2014   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   Causalgia of upper extremity 02/23/2014   Dysthymic disorder 02/23/2014   Edema 02/23/2014   Elevated blood-pressure reading without diagnosis of hypertension 02/23/2014   Elevated WBC count 02/23/2014   Hearing loss  of both ears 02/23/2014   High risk medication use 02/23/2014   Hip joint painful on movement 02/23/2014   Mixed hyperlipidemia 02/23/2014   Dislocation of right patella 02/23/2014   Irritable bowel syndrome 02/23/2014   Primary osteoarthritis involving multiple joints 02/23/2014   Sudden idiopathic hearing loss 02/23/2014   Primary osteoarthritis of  left knee 02/23/2014   Primary osteoarthritis of right knee 02/23/2014   Osteoarthritis 02/23/2014   Internal derangement of left knee 02/23/2014   Pain in joint, pelvic region and thigh 02/23/2014   Pain in joint, ankle and foot 02/23/2014   Coccydynia 02/23/2014   Right-sided low back pain with sciatica 02/19/2014   Arthropathic psoriasis, unspecified ([place]) 02/02/2014   Fall at home 10/04/2012   Acute ischemic stroke ([place]) 01/19/2012   Hypertension 01/19/2012   Anxiety 01/19/2012   Chronic knee pain 01/19/2012   Restless legs syndrome 09/05/2010   HYPERSOMNIA 06/04/2010    Past Surgical History:  Procedure Laterality Date   CARPAL TUNNEL RELEASE     B/L hand   CHOLECYSTECTOMY     FOOT SURGERY     with hardware    HERNIA REPAIR     KNEE ARTHROSCOPY Bilateral 08/2014   [facility] Dr[facility] Len Childs   TEE WITHOUT CARDIOVERSION  01/22/2012   Procedure: TRANSESOPHAGEAL ECHOCARDIOGRAM (TEE);  Surgeon: Lelon Perla, MD;  Location: [facility] ENDOSCOPY;  Service: Cardiovascular;  Laterality: N/A;   TOTAL HIP ARTHROPLASTY Bilateral      OB History   No obstetric history on file.     Family History  Problem Relation Age of Onset   Hypertension Mother    Hypertension Father    Colon cancer Maternal Grandfather    Colon cancer Paternal Grandfather    Colon polyps Neg Hx    Esophageal cancer Neg Hx    Rectal cancer Neg Hx    Stomach cancer Neg Hx     Social History   Tobacco Use   Smoking status: Former    Pack years: 0.00   Smokeless tobacco: Never   Tobacco comments:    quit 10 years ago, smoked intermittently for few years. Less than 10 yrs total.  Vaping Use   Vaping Use: Never used  Substance Use Topics   Alcohol use: Yes    Comment: occ- 1-2 drinks a week   Drug use: No    Home Medications Prior to Admission medications   Medication Sig Start Date End Date Taking? Authorizing Provider  aspirin 325 MG EC tablet Take 325 mg by mouth daily.     [provider]   atorvastatin (LIPITOR) 40 MG tablet Take 40 mg by mouth daily. 07/06/19   [provider]  BELBUCA 450 MCG FILM Take by mouth. 12/30/20   [provider]  escitalopram (LEXAPRO) 20 MG tablet Take 20 mg by mouth daily.    [provider]  inFLIXimab (REMICADE IV) Inject into the vein.    [provider]  losartan (COZAAR) 100 MG tablet Take 100 mg by mouth daily. 12/21/19   [provider]  potassium chloride SA (KLOR-CON) 20 MEQ tablet Take 40 mEq by mouth daily.  03/28/20   [provider]  pregabalin (LYRICA) 75 MG capsule Take 150 mg by mouth 3 (three) times daily.     [provider]  traZODone (DESYREL) 50 MG tablet Take 50 mg by mouth at bedtime.    [provider]    Allergies    Nsaids  Review of Systems  Review of Systems  Constitutional:  Negative for fever.  HENT:  Negative for ear pain.   Eyes:  Negative for pain.  Respiratory:  Negative for cough.   Cardiovascular:  Negative for chest pain.  Gastrointestinal:  Negative for abdominal pain.  Genitourinary:  Negative for flank pain.  Musculoskeletal:  Negative for back pain.  Skin:  Negative for rash.  Neurological:  Negative for headaches.   Physical Exam Updated Vital Signs BP (!) 117/104   Pulse (!) 51   Temp 98.7 F (37.1 C) (Oral)   Resp 18   SpO2 100%   Physical Exam Constitutional:      General: She is not in acute distress.    Appearance: Normal appearance.  HENT:     Head: Normocephalic.     Nose: Nose normal.  Eyes:     Extraocular Movements: Extraocular movements intact.  Cardiovascular:     Rate and Rhythm: Normal rate.  Pulmonary:     Effort: Pulmonary effort is normal.  Musculoskeletal:        General: Normal range of motion.     Cervical back: Normal range of motion.  Neurological:     Mental Status: She is alert. Mental status is at baseline.     Comments: Cranial nerves II through XII intact.  Strength is 5/5 all  extremities.  Finger-nose appears intact heel-to-shin appears intact.  She is a slightly unsteady gait although she is able to ambulate without any assistance.    ED Results / Procedures / Treatments   Labs (all labs ordered are listed, but only abnormal results are displayed) Labs Reviewed  COMPREHENSIVE METABOLIC PANEL - Abnormal; Notable for the following components:      Result Value   Glucose, Bld 111 (*)    All other components within normal limits  URINALYSIS, ROUTINE W REFLEX MICROSCOPIC - Abnormal; Notable for the following components:   Color, Urine AMBER (*)    APPearance HAZY (*)    Hgb urine dipstick SMALL (*)    Bilirubin Urine SMALL (*)    Ketones, ur 20 (*)    Protein, ur 100 (*)    Non Squamous Epithelial 0-5 (*)    All other components within normal limits  CBC WITH DIFFERENTIAL/PLATELET  MAGNESIUM    EKG None  Radiology CT Head Wo Contrast  Result Date: 02/18/2021 CLINICAL DATA:  Stroke-like symptoms EXAM: CT HEAD WITHOUT CONTRAST TECHNIQUE: Contiguous axial images were obtained from the base of the skull through the vertex without intravenous contrast. COMPARISON:  01/21/2020 FINDINGS: Brain: No evidence of acute infarction, hemorrhage, hydrocephalus, extra-axial collection or mass lesion/mass effect. Lacunar infarct is noted in the right basal ganglia. This is stable from the prior exam. Vascular: No hyperdense vessel or unexpected calcification. Skull: Normal. Negative for fracture or focal lesion. Sinuses/Orbits: No acute finding. Other: None. IMPRESSION: No acute intracranial abnormality noted. Electronically Signed   By: Inez Catalina M.D.   On: 02/18/2021 20:06    Procedures Procedures   Medications Ordered in ED Medications  prochlorperazine (COMPAZINE) tablet 10 mg (has no administration in time range)  acetaminophen (TYLENOL) tablet 650 mg (650 mg Oral Given 02/18/21 2230)    ED Course  I have reviewed the triage vital signs and the nursing  notes.  Pertinent labs & imaging results that were available during my care of the patient were reviewed by me and considered in my medical decision making (see chart for details).    MDM Rules/Calculators/A&P  Patient is not a candidate for tPA given onset of symptoms is greater than 24 hours, however she has no focal neurodeficit at this time and questionable unsteady gait.  Labs unremarkable.  CT imaging of the brain unremarkable no acute findings noted.  Given her history and symptoms, MRI of the brain pursued.  During the ER stay, patient states that she is developing a headache, given Tylenol and Compazine.  MRI brain pending, will be signed out to oncoming provider.  Final Clinical Impression(s) / ED Diagnoses Final diagnoses:  Paresthesia    Rx / DC Orders ED Discharge Orders     None        Luna Fuse, MD 02/18/21 2252

## 2021-02-18 NOTE — ED Triage Notes (Signed)
Per EMS, pt went to UC yesterday for dizziness and nausea, prescribed zofran. Since 9PM, both arms and legs and head felt heavy. No weakness or arm drift, no neuro deficits. Pt was anxious upon EMS arrival. Pt also reports some urinary retention.

## 2021-02-19 NOTE — ED Notes (Signed)
Spoke with EDP and spoke with patient. Her husband is coming to pick her up. She has been discharged per Dr. Almyra Free.

## 2021-02-19 NOTE — ED Notes (Signed)
Pt states that she does not feel comfortable discharging at this time since she is still experiencing blurred vision, head pressure, and a feeling that something is "just not right." RN advised charge nurse and charge will contact MD on duty as the attending physician has left the facility.

## 2021-03-08 ENCOUNTER — Telehealth: Payer: Self-pay | Admitting: *Deleted

## 2021-03-08 ENCOUNTER — Other Ambulatory Visit: Payer: Self-pay | Admitting: *Deleted

## 2021-03-08 DIAGNOSIS — D500 Iron deficiency anemia secondary to blood loss (chronic): Secondary | ICD-10-CM

## 2021-03-08 NOTE — Telephone Encounter (Signed)
Received call from patient stating that she had labs done at her PCP office that showed she was anemic again. Able to retrieve her labs which showed an HGB of 12.7. Iron saturation down a bit. Reviewed these with Dr. Lorenso Courier. He would like to have her labs re-checked in here in 2 weeks , including a Ferritin. Pt agreeable to this and an appt for labs was made. For 03/22/21

## 2021-03-13 ENCOUNTER — Inpatient Hospital Stay: Payer: BC Managed Care – PPO | Attending: Hematology & Oncology

## 2021-03-13 ENCOUNTER — Telehealth: Payer: Self-pay | Admitting: *Deleted

## 2021-03-13 ENCOUNTER — Other Ambulatory Visit: Payer: Self-pay

## 2021-03-13 DIAGNOSIS — D500 Iron deficiency anemia secondary to blood loss (chronic): Secondary | ICD-10-CM

## 2021-03-13 DIAGNOSIS — D509 Iron deficiency anemia, unspecified: Secondary | ICD-10-CM | POA: Insufficient documentation

## 2021-03-13 LAB — CBC WITH DIFFERENTIAL (CANCER CENTER ONLY)
Abs Immature Granulocytes: 0.03 10*3/uL (ref 0.00–0.07)
Basophils Absolute: 0.1 10*3/uL (ref 0.0–0.1)
Basophils Relative: 1 %
Eosinophils Absolute: 0.1 10*3/uL (ref 0.0–0.5)
Eosinophils Relative: 1 %
HCT: 43 % (ref 36.0–46.0)
Hemoglobin: 14.6 g/dL (ref 12.0–15.0)
Immature Granulocytes: 0 %
Lymphocytes Relative: 18 %
Lymphs Abs: 2.1 10*3/uL (ref 0.7–4.0)
MCH: 29.6 pg (ref 26.0–34.0)
MCHC: 34 g/dL (ref 30.0–36.0)
MCV: 87 fL (ref 80.0–100.0)
Monocytes Absolute: 0.6 10*3/uL (ref 0.1–1.0)
Monocytes Relative: 6 %
Neutro Abs: 8.3 10*3/uL — ABNORMAL HIGH (ref 1.7–7.7)
Neutrophils Relative %: 74 %
Platelet Count: 422 10*3/uL — ABNORMAL HIGH (ref 150–400)
RBC: 4.94 MIL/uL (ref 3.87–5.11)
RDW: 14.5 % (ref 11.5–15.5)
WBC Count: 11.2 10*3/uL — ABNORMAL HIGH (ref 4.0–10.5)
nRBC: 0 % (ref 0.0–0.2)

## 2021-03-13 LAB — CMP (CANCER CENTER ONLY)
ALT: 23 U/L (ref 0–44)
AST: 18 U/L (ref 15–41)
Albumin: 4.3 g/dL (ref 3.5–5.0)
Alkaline Phosphatase: 111 U/L (ref 38–126)
Anion gap: 14 (ref 5–15)
BUN: 9 mg/dL (ref 6–20)
CO2: 25 mmol/L (ref 22–32)
Calcium: 10.2 mg/dL (ref 8.9–10.3)
Chloride: 100 mmol/L (ref 98–111)
Creatinine: 0.8 mg/dL (ref 0.44–1.00)
GFR, Estimated: >60 mL/min (ref >60)
Glucose, Bld: 101 mg/dL — ABNORMAL HIGH (ref 70–99)
Potassium: 4 mmol/L (ref 3.5–5.1)
Sodium: 139 mmol/L (ref 135–145)
Total Bilirubin: 0.5 mg/dL (ref 0.3–1.2)
Total Protein: 7.9 g/dL (ref 6.5–8.1)

## 2021-03-13 NOTE — Telephone Encounter (Signed)
Received call from patient. She sates she is feeling worse, like her body is shutting down. She states she slept all weekend and is vomiting.  Asked patient if she thought she might have a GI infection/bug. She did not readily answer.  Offered to get labs sooner than 03/22/21. Pt agreed to come in this afternoon for labs. Advised that her Ferritin and iron studies would not be available until until much later in the day. .  Reminded her that neither Dr. Lorenso Courier or I are in the office on Tuesday, but another nurse would be covering Dr. Libby Maw calls.  Pt voiced understanding.  Dr. Lorenso Courier aware.

## 2021-03-14 LAB — IRON AND TIBC
Iron: 68 ug/dL (ref 41–142)
Saturation Ratios: 17 % — ABNORMAL LOW (ref 21–57)
TIBC: 403 ug/dL (ref 236–444)
UIBC: 335 ug/dL (ref 120–384)

## 2021-03-14 LAB — FERRITIN: Ferritin: 48 ng/mL (ref 11–307)

## 2021-03-15 ENCOUNTER — Other Ambulatory Visit: Payer: BC Managed Care – PPO

## 2021-03-15 ENCOUNTER — Telehealth: Payer: Self-pay | Admitting: *Deleted

## 2021-03-15 NOTE — Telephone Encounter (Signed)
Received call from patient regarding her lab results. Advised that her labs look very good per Dr. Lorenso Courier. No indications for IV iron at this time. Pt states she just feels bad. Advised that she could just have a GI bug (as she had some nausea and vomiting over the weekend and slept a lot.  Advised that from a hematology perspective, she is dong well. Pt voiced understanding.

## 2021-03-22 ENCOUNTER — Other Ambulatory Visit: Payer: BC Managed Care – PPO

## 2021-07-14 ENCOUNTER — Other Ambulatory Visit: Payer: BC Managed Care – PPO

## 2021-07-14 ENCOUNTER — Ambulatory Visit: Payer: BC Managed Care – PPO | Admitting: Hematology and Oncology

## 2021-07-17 ENCOUNTER — Other Ambulatory Visit: Payer: BC Managed Care – PPO

## 2021-07-17 ENCOUNTER — Ambulatory Visit: Payer: BC Managed Care – PPO | Admitting: Hematology and Oncology

## 2021-07-17 ENCOUNTER — Other Ambulatory Visit: Payer: Self-pay | Admitting: Hematology and Oncology

## 2021-07-17 DIAGNOSIS — D500 Iron deficiency anemia secondary to blood loss (chronic): Secondary | ICD-10-CM

## 2021-07-17 NOTE — Progress Notes (Signed)
No show

## 2021-07-19 ENCOUNTER — Encounter: Payer: Self-pay | Admitting: Hematology and Oncology

## 2021-10-07 IMAGING — CT CT HEAD W/O CM
3 series · 16 of 47 positions shown, 19 images · non-contrast
Comparison: 01/21/2020

CLINICAL DATA: Stroke-like symptoms

EXAM:
CT HEAD WITHOUT CONTRAST
TECHNIQUE: Contiguous axial images were obtained from the base of the skull
through the vertex without intravenous contrast.

[Series 2: head wo · axial · 0.47mm/px · z∈[-201,-76]mm · 10 of 31 slices shown, 13 images]
[im 3/31  brain]
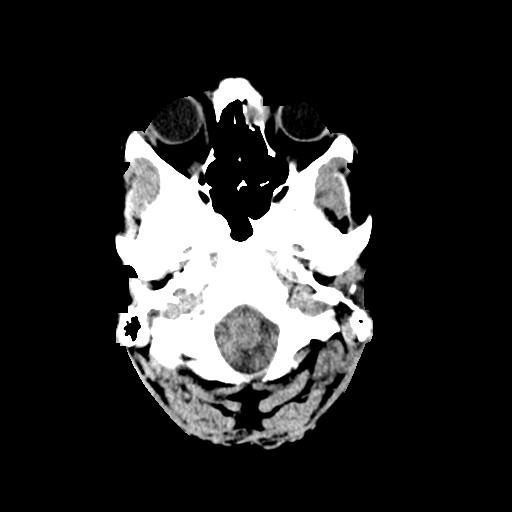
[im 3/31  bone]
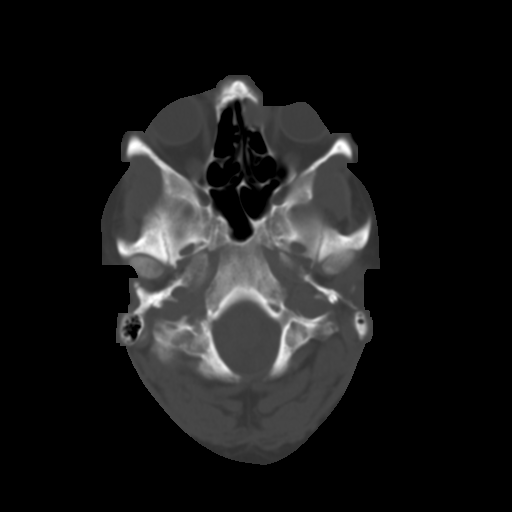
[im 6/31  brain]
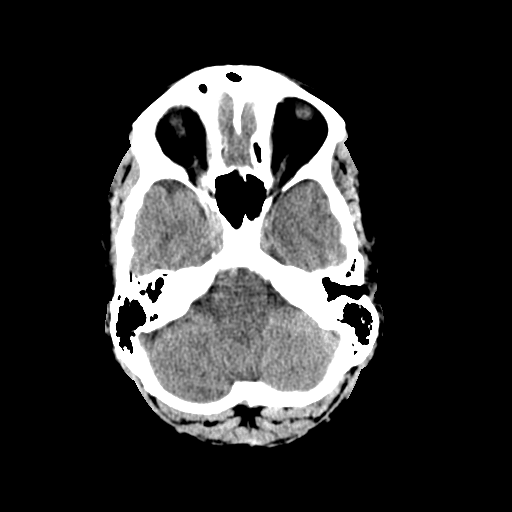
[im 9/31  brain]
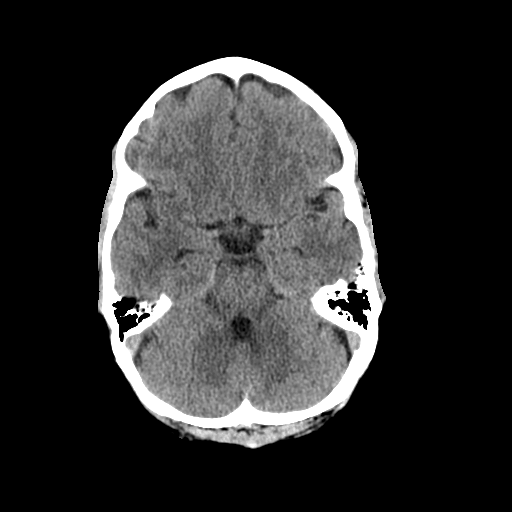
[im 11/31  brain]
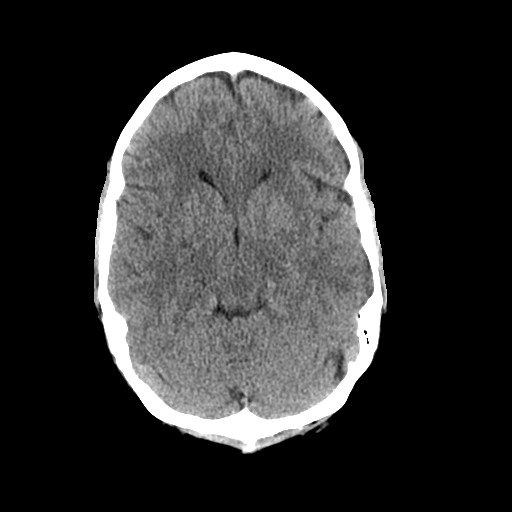
[im 14/31  brain]
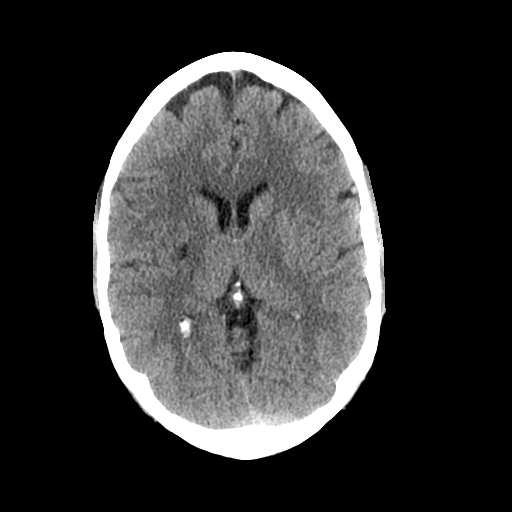
[im 14/31  bone]
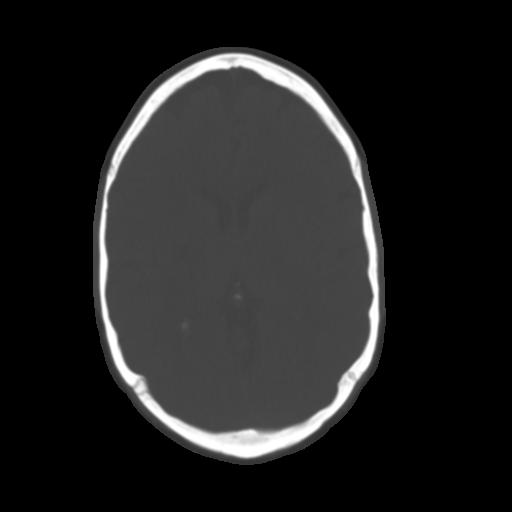
[im 17/31  brain]
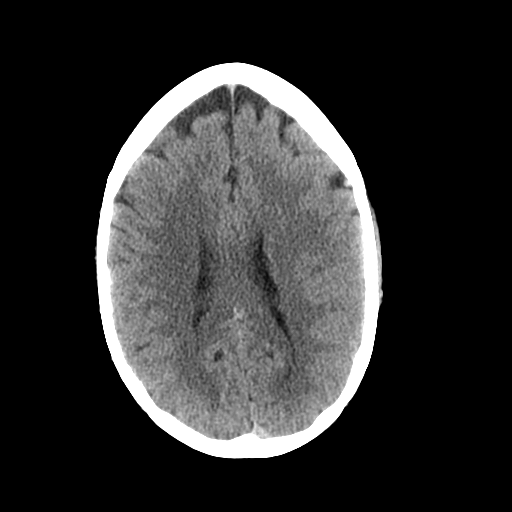
[im 20/31  brain]
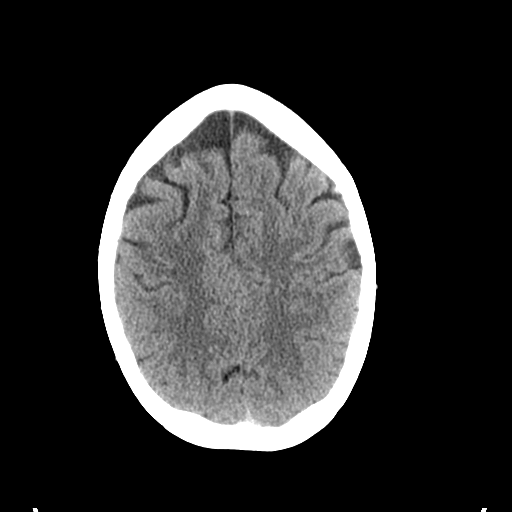
[im 23/31  brain]
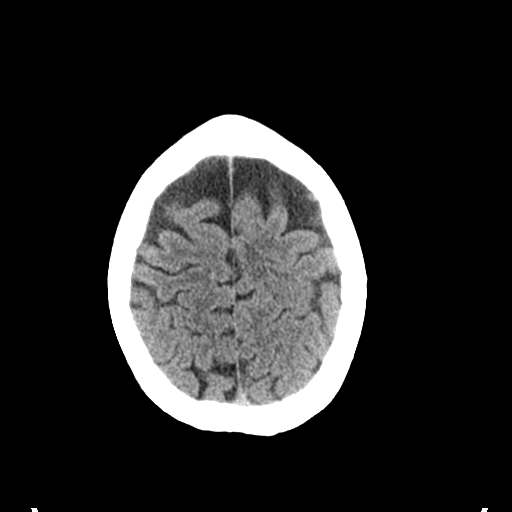
[im 25/31  brain]
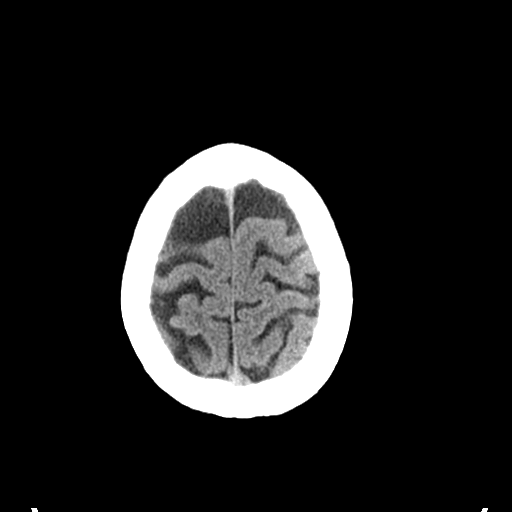
[im 25/31  bone]
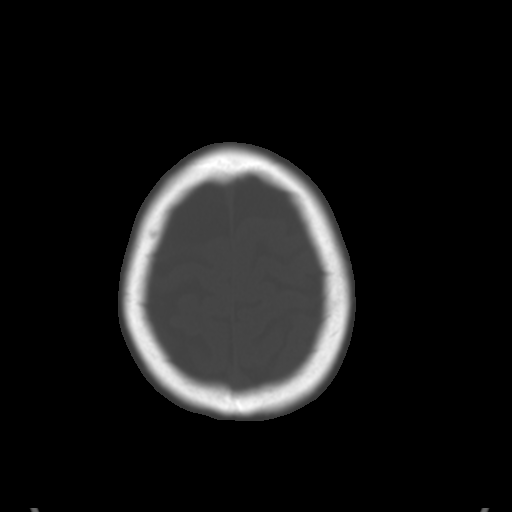
[im 28/31  brain]
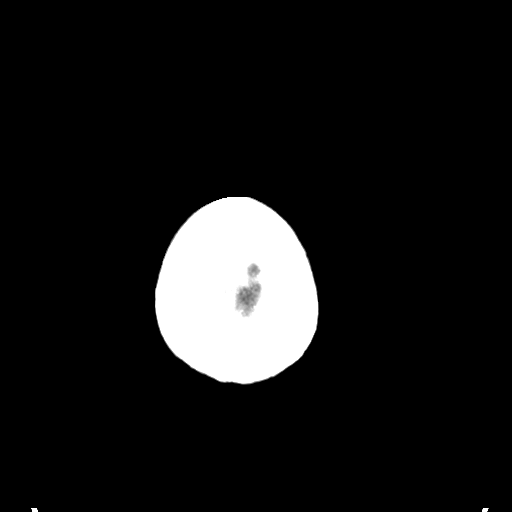

[Series 5: coronal soft tissue · coronal · 0.31mm/px · 3 of 72 slices shown]
[im 24/72  brain]
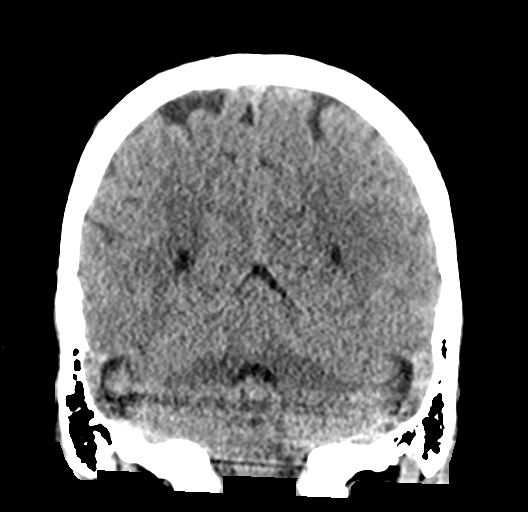
[im 32/72  brain]
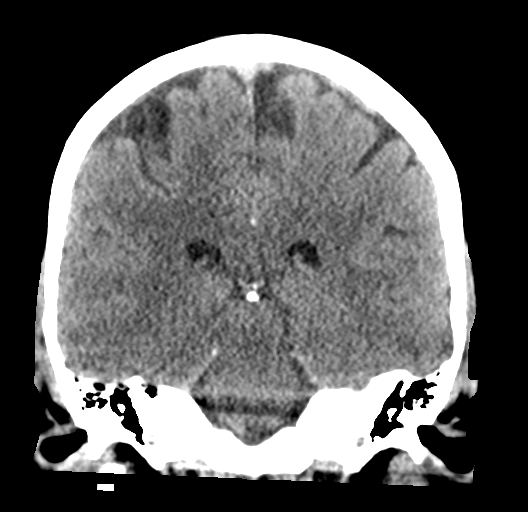
[im 40/72  brain]
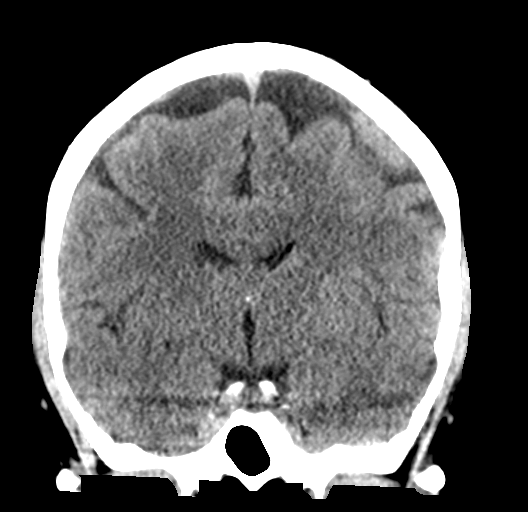

[Series 6: sagittal soft tissue · sagittal · 0.31mm/px · 3 of 55 slices shown]
[im 19/55  brain]
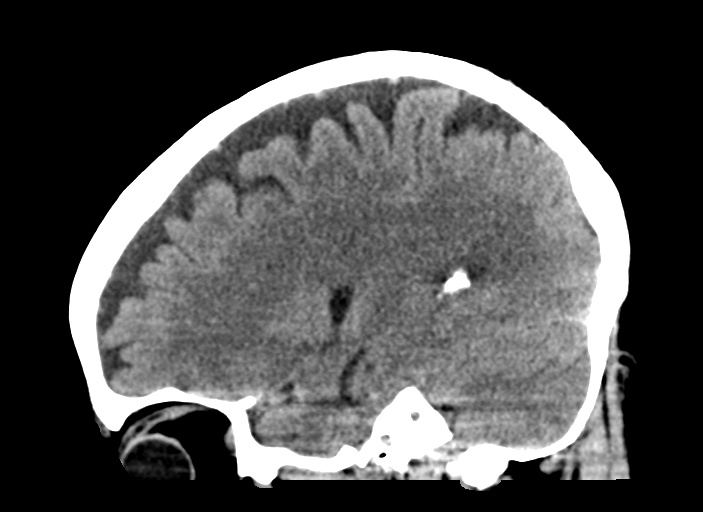
[im 28/55  brain]
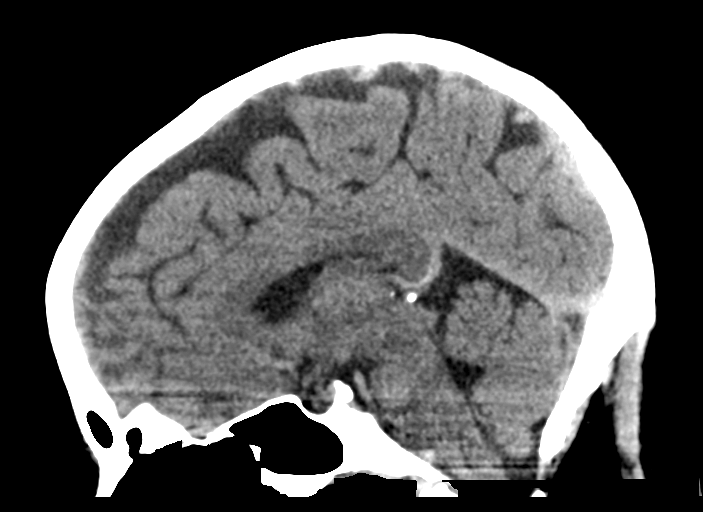
[im 37/55  brain]
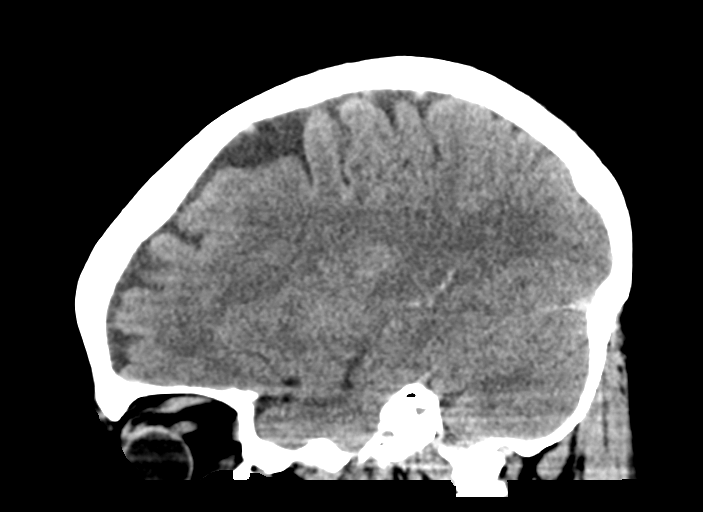

[16 of 47 positions shown; findings below may reference images not displayed]

FINDINGS: Brain: No evidence of acute infarction, hemorrhage, hydrocephalus,
extra-axial collection or mass lesion/mass effect. Lacunar infarct
is noted in the right basal ganglia. This is stable from the prior
exam.

Vascular: No hyperdense vessel or unexpected calcification.

Skull: Normal. Negative for fracture or focal lesion.

Sinuses/Orbits: No acute finding.

Other: None.
IMPRESSION: No acute intracranial abnormality noted.

## 2023-02-02 DIAGNOSIS — Z419 Encounter for procedure for purposes other than remedying health state, unspecified: Secondary | ICD-10-CM | POA: Diagnosis not present

## 2023-03-04 DIAGNOSIS — Z419 Encounter for procedure for purposes other than remedying health state, unspecified: Secondary | ICD-10-CM | POA: Diagnosis not present

## 2023-03-05 DIAGNOSIS — R600 Localized edema: Secondary | ICD-10-CM | POA: Diagnosis not present

## 2023-03-05 DIAGNOSIS — R7309 Other abnormal glucose: Secondary | ICD-10-CM | POA: Diagnosis not present

## 2023-03-05 DIAGNOSIS — D509 Iron deficiency anemia, unspecified: Secondary | ICD-10-CM | POA: Diagnosis not present

## 2023-03-05 DIAGNOSIS — I10 Essential (primary) hypertension: Secondary | ICD-10-CM | POA: Diagnosis not present

## 2023-03-13 DIAGNOSIS — M25512 Pain in left shoulder: Secondary | ICD-10-CM | POA: Diagnosis not present

## 2023-03-13 DIAGNOSIS — M2560 Stiffness of unspecified joint, not elsewhere classified: Secondary | ICD-10-CM | POA: Diagnosis not present

## 2023-03-13 DIAGNOSIS — T84022D Instability of internal right knee prosthesis, subsequent encounter: Secondary | ICD-10-CM | POA: Diagnosis not present

## 2023-03-13 DIAGNOSIS — M6281 Muscle weakness (generalized): Secondary | ICD-10-CM | POA: Diagnosis not present

## 2023-03-13 DIAGNOSIS — R262 Difficulty in walking, not elsewhere classified: Secondary | ICD-10-CM | POA: Diagnosis not present

## 2023-03-19 DIAGNOSIS — Z471 Aftercare following joint replacement surgery: Secondary | ICD-10-CM | POA: Diagnosis not present

## 2023-03-19 DIAGNOSIS — G894 Chronic pain syndrome: Secondary | ICD-10-CM | POA: Diagnosis not present

## 2023-03-19 DIAGNOSIS — Z96651 Presence of right artificial knee joint: Secondary | ICD-10-CM | POA: Diagnosis not present

## 2023-03-19 DIAGNOSIS — Z96649 Presence of unspecified artificial hip joint: Secondary | ICD-10-CM | POA: Diagnosis not present

## 2023-03-19 DIAGNOSIS — M25552 Pain in left hip: Secondary | ICD-10-CM | POA: Diagnosis not present

## 2023-03-19 DIAGNOSIS — M25461 Effusion, right knee: Secondary | ICD-10-CM | POA: Diagnosis not present

## 2023-03-19 DIAGNOSIS — T85848S Pain due to other internal prosthetic devices, implants and grafts, sequela: Secondary | ICD-10-CM | POA: Diagnosis not present

## 2023-04-04 DIAGNOSIS — Z419 Encounter for procedure for purposes other than remedying health state, unspecified: Secondary | ICD-10-CM | POA: Diagnosis not present

## 2023-04-16 DIAGNOSIS — Z471 Aftercare following joint replacement surgery: Secondary | ICD-10-CM | POA: Diagnosis not present

## 2023-04-16 DIAGNOSIS — G8918 Other acute postprocedural pain: Secondary | ICD-10-CM | POA: Diagnosis not present

## 2023-04-16 DIAGNOSIS — Z6841 Body Mass Index (BMI) 40.0 and over, adult: Secondary | ICD-10-CM | POA: Diagnosis not present

## 2023-04-16 DIAGNOSIS — M159 Polyosteoarthritis, unspecified: Secondary | ICD-10-CM | POA: Diagnosis not present

## 2023-04-16 DIAGNOSIS — Z8262 Family history of osteoporosis: Secondary | ICD-10-CM | POA: Diagnosis not present

## 2023-04-16 DIAGNOSIS — Z96642 Presence of left artificial hip joint: Secondary | ICD-10-CM | POA: Diagnosis not present

## 2023-04-16 DIAGNOSIS — E669 Obesity, unspecified: Secondary | ICD-10-CM | POA: Diagnosis not present

## 2023-04-16 DIAGNOSIS — L4050 Arthropathic psoriasis, unspecified: Secondary | ICD-10-CM | POA: Diagnosis not present

## 2023-04-16 DIAGNOSIS — H9193 Unspecified hearing loss, bilateral: Secondary | ICD-10-CM | POA: Diagnosis not present

## 2023-04-16 DIAGNOSIS — M25552 Pain in left hip: Secondary | ICD-10-CM | POA: Diagnosis not present

## 2023-04-16 DIAGNOSIS — F411 Generalized anxiety disorder: Secondary | ICD-10-CM | POA: Diagnosis not present

## 2023-04-16 DIAGNOSIS — Z96641 Presence of right artificial hip joint: Secondary | ICD-10-CM | POA: Diagnosis not present

## 2023-04-16 DIAGNOSIS — G2581 Restless legs syndrome: Secondary | ICD-10-CM | POA: Diagnosis not present

## 2023-04-16 DIAGNOSIS — Z800 Family history of malignant neoplasm of digestive organs: Secondary | ICD-10-CM | POA: Diagnosis not present

## 2023-04-16 DIAGNOSIS — T84031A Mechanical loosening of internal left hip prosthetic joint, initial encounter: Secondary | ICD-10-CM | POA: Diagnosis not present

## 2023-04-16 DIAGNOSIS — I10 Essential (primary) hypertension: Secondary | ICD-10-CM | POA: Diagnosis not present

## 2023-04-16 DIAGNOSIS — E782 Mixed hyperlipidemia: Secondary | ICD-10-CM | POA: Diagnosis not present

## 2023-04-16 DIAGNOSIS — G894 Chronic pain syndrome: Secondary | ICD-10-CM | POA: Diagnosis not present

## 2023-04-17 DIAGNOSIS — G8918 Other acute postprocedural pain: Secondary | ICD-10-CM | POA: Diagnosis not present

## 2023-04-22 ENCOUNTER — Encounter: Payer: Self-pay | Admitting: Hematology and Oncology

## 2023-04-22 ENCOUNTER — Telehealth: Payer: Self-pay

## 2023-04-22 NOTE — Transitions of Care (Post Inpatient/ED Visit) (Signed)
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 Is in contact with Orthopedic surgeon's office re pain and need for Dilaudid 4-6mg  q4hrs as needed for pain) Any questions or concerns?: No (has no questions/concerns that can be addressed by this RN - pt referred to her Orthopedic surgeon's office for f/u on continued pain)  Items Reviewed: Did you receive and understand the discharge instructions provided?: Yes Medications obtained,verified, and reconciled?: Partial Review Completed Reason for Partial Mediation Review: Pt in too much pain to continue TOC engagement - requested i call back later in the week Any new allergies since your discharge?: No Dietary orders reviewed?: NA Do you have support at home?: Yes People in Home: spouse  Medications Reviewed Today: Medications Reviewed Today     Reviewed by Marcos Eke, RN (Registered Nurse) on 04/22/23 at 1058  Med List Status: <None>   Medication Order Taking? Sig Documenting Provider Last Dose Status Informant  aspirin 325 MG EC tablet [id number]  Take 325 mg by mouth daily.  [provider]  Active Self  atorvastatin (LIPITOR) 40 MG tablet [id number] Yes Take 40 mg by mouth daily. [provider] Taking  Active Self  BELBUCA 450 MCG FILM [id number]  Take by mouth. [provider]  Active   escitalopram (LEXAPRO) 20 MG tablet [id number] Yes Take 20 mg by mouth daily. [provider] Taking Active Self  inFLIXimab (REMICADE IV) [id number]  Inject into the vein. [provider]  Active   losartan (COZAAR) 100 MG tablet [id number] Yes Take 100 mg by mouth daily. [provider] Taking Active Self  potassium chloride SA (KLOR-CON) 20 MEQ tablet [id number] Yes Take 40 mEq by mouth daily.  [provider] Taking Active Self  pregabalin (LYRICA) 75 MG capsule [id number] Yes Take 150 mg by mouth 3 (three) times daily.  [provider] Taking Active Self           Med Note Worthy Rancher, CHASIE F   Tue Aug 11, 2019  9:48 AM)    traZODone (DESYREL) 50 MG tablet [id number] Yes Take 50 mg by mouth at bedtime. [provider] Taking Active Self            Home Care and Equipment/Supplies:    Functional Questionnaire: Do you need assistance with bathing/showering or dressing?: No Do you need assistance with meal preparation?: No Do you need assistance with eating?: No Do you have difficulty maintaining continence: No Do you need assistance with getting out of bed/getting out of a chair/moving?: No Do you have difficulty managing or taking your medications?: No  Follow up appointments reviewed: Specialist Hospital Follow-up appointment confirmed?: Yes Date of Specialist follow-up appointment?: 04/30/23 Follow-Up Specialty Provider:: [facility] w/ Barbara Cower Heggerick PA-C Do you need transportation to your follow-up appointment?: No Do you understand care options if your condition(s) worsen?: Yes-patient verbalized understanding  SDOH Interventions Today    Flowsheet Row Most Recent Value  SDOH Interventions   Physical Activity Interventions Other (Comments)  [pt has recently undergone revision of L THA on 8/13 and remains in 8-9/10 pain,  has not been able to participate in scheduled PT (rescheduled her 8/19 appt)]     Pt Care Plan: initially - Pain related to recent revision of Left Total Hip Arthroplasty on 8/13. Patient c/o 8-9 out of 10 pain to left hip operative site - pain is disrupting level of physical activity - she had to postpone her scheduled post op outpatient PT in [place] today (8/19) d/t ongoing pain. Dr. Levonne Lapping, Ortho/Surgeon aware of pt's pain level - has ordered Dilaudid 4-6mg  q4hrs as need for pain management.  Pt was unable to continue TOC engagement d/t pain so we cut our initial assessment today short - will follow up with patient on Thursday, Oct 22   SIGNATURE Alyse Low, RN, BA, [facility], CRRN [facility] Coordinator, Transition of Care Ph # [phone]

## 2023-04-25 ENCOUNTER — Telehealth: Payer: Self-pay

## 2023-04-25 ENCOUNTER — Other Ambulatory Visit: Payer: BC Managed Care – PPO

## 2023-04-25 NOTE — Transitions of Care (Post Inpatient/ED Visit) (Signed)
   04/25/2023  Name: [name] [name] MRN: [id number] DOB: [birth year]  Today's TOC FU Call Status: Today's TOC FU Call Status:: Unsuccessful Call (1st Attempt) Unsuccessful Call (1st Attempt) Date: 04/25/23  Attempted to reach the patient regarding the most recent Inpatient/ED visit.  Follow Up Plan: Additional outreach attempts will be made to reach the patient to complete the Transitions of Care (Post Inpatient/ED visit) call.   I'm sorry I was unable to connect with you @10am  on 8/22 - will try to call you back in the next 24 hrs. Hope you are doing better with your post op pain. Sincerely, Franki Monte, RN, BA, [facility], [facility] Health Care Management Coordinator Ph # [phone]

## 2023-04-26 ENCOUNTER — Telehealth: Payer: Self-pay

## 2023-04-26 NOTE — Transitions of Care (Post Inpatient/ED Visit) (Signed)
   04/26/2023  Name: [name] [name] MRN: [id number] DOB: [birth year]  Today's TOC FU Call Status: Today's TOC FU Call Status:: Unsuccessful Call (2nd Attempt)  Attempted to reach the patient regarding the most recent Inpatient/ED visit.  Follow Up Plan: Additional outreach attempts will be made to reach the patient to complete the Transitions of Care (Post Inpatient/ED visit) call.   Alyse Low, RN, BA, [facility], CRRN [facility] Coordinator, Transition of Care Ph # [phone]

## 2023-04-29 ENCOUNTER — Telehealth: Payer: Self-pay

## 2023-04-29 NOTE — Patient Outreach (Signed)
  Care Management  Transitions of Care Program Managed Medicaid Transitions of Care week 2  04/29/2023 Name: [name] [name] MRN: [id number] DOB: 1970-12-03  Subjective: [name] [name] is a 52 y.o. year old female who is a primary care patient of Angelica Chessman, MD. The Care Management team was unable to reach the patient by phone to assess and address transitions of care needs.   Plan: Additional outreach attempts will be made to reach the patient enrolled in the [facility] Program (Post Inpatient/ED Visit).  Alyse Low, RN, BA, [facility], [facility] Coordinator, Transition of Care Ph # [phone]

## 2023-05-01 DIAGNOSIS — Z96642 Presence of left artificial hip joint: Secondary | ICD-10-CM | POA: Diagnosis not present

## 2023-05-01 DIAGNOSIS — Z471 Aftercare following joint replacement surgery: Secondary | ICD-10-CM | POA: Diagnosis not present

## 2023-05-02 DIAGNOSIS — Z96649 Presence of unspecified artificial hip joint: Secondary | ICD-10-CM | POA: Diagnosis not present

## 2023-05-02 DIAGNOSIS — R262 Difficulty in walking, not elsewhere classified: Secondary | ICD-10-CM | POA: Diagnosis not present

## 2023-05-02 DIAGNOSIS — M6281 Muscle weakness (generalized): Secondary | ICD-10-CM | POA: Diagnosis not present

## 2023-05-02 DIAGNOSIS — M2560 Stiffness of unspecified joint, not elsewhere classified: Secondary | ICD-10-CM | POA: Diagnosis not present

## 2023-05-05 DIAGNOSIS — Z419 Encounter for procedure for purposes other than remedying health state, unspecified: Secondary | ICD-10-CM | POA: Diagnosis not present

## 2023-05-10 DIAGNOSIS — Z96649 Presence of unspecified artificial hip joint: Secondary | ICD-10-CM | POA: Diagnosis not present

## 2023-05-10 DIAGNOSIS — M25552 Pain in left hip: Secondary | ICD-10-CM | POA: Diagnosis not present

## 2023-05-10 DIAGNOSIS — Z96642 Presence of left artificial hip joint: Secondary | ICD-10-CM | POA: Diagnosis not present

## 2023-05-15 DIAGNOSIS — G894 Chronic pain syndrome: Secondary | ICD-10-CM | POA: Diagnosis not present

## 2023-05-20 DIAGNOSIS — D509 Iron deficiency anemia, unspecified: Secondary | ICD-10-CM | POA: Diagnosis not present

## 2023-05-21 DIAGNOSIS — M2560 Stiffness of unspecified joint, not elsewhere classified: Secondary | ICD-10-CM | POA: Diagnosis not present

## 2023-05-21 DIAGNOSIS — M6281 Muscle weakness (generalized): Secondary | ICD-10-CM | POA: Diagnosis not present

## 2023-05-21 DIAGNOSIS — Z96649 Presence of unspecified artificial hip joint: Secondary | ICD-10-CM | POA: Diagnosis not present

## 2023-05-21 DIAGNOSIS — R262 Difficulty in walking, not elsewhere classified: Secondary | ICD-10-CM | POA: Diagnosis not present

## 2023-05-27 DIAGNOSIS — Z471 Aftercare following joint replacement surgery: Secondary | ICD-10-CM | POA: Diagnosis not present

## 2023-05-27 DIAGNOSIS — Z96642 Presence of left artificial hip joint: Secondary | ICD-10-CM | POA: Diagnosis not present

## 2023-05-27 DIAGNOSIS — M533 Sacrococcygeal disorders, not elsewhere classified: Secondary | ICD-10-CM | POA: Diagnosis not present

## 2023-05-27 DIAGNOSIS — Z96643 Presence of artificial hip joint, bilateral: Secondary | ICD-10-CM | POA: Diagnosis not present

## 2023-05-28 DIAGNOSIS — Z96649 Presence of unspecified artificial hip joint: Secondary | ICD-10-CM | POA: Diagnosis not present

## 2023-05-28 DIAGNOSIS — R262 Difficulty in walking, not elsewhere classified: Secondary | ICD-10-CM | POA: Diagnosis not present

## 2023-05-28 DIAGNOSIS — M6281 Muscle weakness (generalized): Secondary | ICD-10-CM | POA: Diagnosis not present

## 2023-05-28 DIAGNOSIS — M2560 Stiffness of unspecified joint, not elsewhere classified: Secondary | ICD-10-CM | POA: Diagnosis not present

## 2023-05-29 DIAGNOSIS — R202 Paresthesia of skin: Secondary | ICD-10-CM | POA: Diagnosis not present

## 2023-05-29 DIAGNOSIS — M5031 Other cervical disc degeneration,  high cervical region: Secondary | ICD-10-CM | POA: Diagnosis not present

## 2023-05-29 DIAGNOSIS — Z4789 Encounter for other orthopedic aftercare: Secondary | ICD-10-CM | POA: Diagnosis not present

## 2023-05-29 DIAGNOSIS — Z96612 Presence of left artificial shoulder joint: Secondary | ICD-10-CM | POA: Diagnosis not present

## 2023-05-29 DIAGNOSIS — Z96611 Presence of right artificial shoulder joint: Secondary | ICD-10-CM | POA: Diagnosis not present

## 2023-05-29 DIAGNOSIS — R200 Anesthesia of skin: Secondary | ICD-10-CM | POA: Diagnosis not present

## 2023-05-29 DIAGNOSIS — M5033 Other cervical disc degeneration, cervicothoracic region: Secondary | ICD-10-CM | POA: Diagnosis not present

## 2023-05-29 DIAGNOSIS — Z981 Arthrodesis status: Secondary | ICD-10-CM | POA: Diagnosis not present

## 2023-05-30 DIAGNOSIS — Z96649 Presence of unspecified artificial hip joint: Secondary | ICD-10-CM | POA: Diagnosis not present

## 2023-05-30 DIAGNOSIS — M6281 Muscle weakness (generalized): Secondary | ICD-10-CM | POA: Diagnosis not present

## 2023-05-30 DIAGNOSIS — M2560 Stiffness of unspecified joint, not elsewhere classified: Secondary | ICD-10-CM | POA: Diagnosis not present

## 2023-05-30 DIAGNOSIS — R262 Difficulty in walking, not elsewhere classified: Secondary | ICD-10-CM | POA: Diagnosis not present

## 2023-06-03 DIAGNOSIS — L4050 Arthropathic psoriasis, unspecified: Secondary | ICD-10-CM | POA: Diagnosis not present

## 2023-06-04 DIAGNOSIS — Z419 Encounter for procedure for purposes other than remedying health state, unspecified: Secondary | ICD-10-CM | POA: Diagnosis not present

## 2023-06-11 DIAGNOSIS — R262 Difficulty in walking, not elsewhere classified: Secondary | ICD-10-CM | POA: Diagnosis not present

## 2023-06-11 DIAGNOSIS — M2560 Stiffness of unspecified joint, not elsewhere classified: Secondary | ICD-10-CM | POA: Diagnosis not present

## 2023-06-11 DIAGNOSIS — M6281 Muscle weakness (generalized): Secondary | ICD-10-CM | POA: Diagnosis not present

## 2023-06-11 DIAGNOSIS — Z96649 Presence of unspecified artificial hip joint: Secondary | ICD-10-CM | POA: Diagnosis not present

## 2023-06-18 DIAGNOSIS — Z96649 Presence of unspecified artificial hip joint: Secondary | ICD-10-CM | POA: Diagnosis not present

## 2023-06-18 DIAGNOSIS — M2560 Stiffness of unspecified joint, not elsewhere classified: Secondary | ICD-10-CM | POA: Diagnosis not present

## 2023-06-18 DIAGNOSIS — R262 Difficulty in walking, not elsewhere classified: Secondary | ICD-10-CM | POA: Diagnosis not present

## 2023-06-18 DIAGNOSIS — M6281 Muscle weakness (generalized): Secondary | ICD-10-CM | POA: Diagnosis not present

## 2023-06-20 DIAGNOSIS — Z96649 Presence of unspecified artificial hip joint: Secondary | ICD-10-CM | POA: Diagnosis not present

## 2023-06-20 DIAGNOSIS — M2560 Stiffness of unspecified joint, not elsewhere classified: Secondary | ICD-10-CM | POA: Diagnosis not present

## 2023-06-20 DIAGNOSIS — R262 Difficulty in walking, not elsewhere classified: Secondary | ICD-10-CM | POA: Diagnosis not present

## 2023-06-20 DIAGNOSIS — M6281 Muscle weakness (generalized): Secondary | ICD-10-CM | POA: Diagnosis not present

## 2023-06-24 DIAGNOSIS — Z981 Arthrodesis status: Secondary | ICD-10-CM | POA: Diagnosis not present

## 2023-06-24 DIAGNOSIS — M47812 Spondylosis without myelopathy or radiculopathy, cervical region: Secondary | ICD-10-CM | POA: Diagnosis not present

## 2023-06-26 DIAGNOSIS — Z981 Arthrodesis status: Secondary | ICD-10-CM | POA: Diagnosis not present

## 2023-06-26 DIAGNOSIS — Z4889 Encounter for other specified surgical aftercare: Secondary | ICD-10-CM | POA: Diagnosis not present

## 2023-06-27 DIAGNOSIS — Z96649 Presence of unspecified artificial hip joint: Secondary | ICD-10-CM | POA: Diagnosis not present

## 2023-06-27 DIAGNOSIS — R262 Difficulty in walking, not elsewhere classified: Secondary | ICD-10-CM | POA: Diagnosis not present

## 2023-06-27 DIAGNOSIS — M2560 Stiffness of unspecified joint, not elsewhere classified: Secondary | ICD-10-CM | POA: Diagnosis not present

## 2023-06-27 DIAGNOSIS — M6281 Muscle weakness (generalized): Secondary | ICD-10-CM | POA: Diagnosis not present

## 2023-06-28 DIAGNOSIS — M150 Primary generalized (osteo)arthritis: Secondary | ICD-10-CM | POA: Diagnosis not present

## 2023-06-28 DIAGNOSIS — L4050 Arthropathic psoriasis, unspecified: Secondary | ICD-10-CM | POA: Diagnosis not present

## 2023-06-28 DIAGNOSIS — Z79899 Other long term (current) drug therapy: Secondary | ICD-10-CM | POA: Diagnosis not present

## 2023-06-28 DIAGNOSIS — G894 Chronic pain syndrome: Secondary | ICD-10-CM | POA: Diagnosis not present

## 2023-06-28 DIAGNOSIS — Z96651 Presence of right artificial knee joint: Secondary | ICD-10-CM | POA: Diagnosis not present

## 2023-07-02 DIAGNOSIS — L4050 Arthropathic psoriasis, unspecified: Secondary | ICD-10-CM | POA: Diagnosis not present

## 2023-07-02 DIAGNOSIS — M5412 Radiculopathy, cervical region: Secondary | ICD-10-CM | POA: Diagnosis not present

## 2023-07-02 DIAGNOSIS — G894 Chronic pain syndrome: Secondary | ICD-10-CM | POA: Diagnosis not present

## 2023-07-03 DIAGNOSIS — I10 Essential (primary) hypertension: Secondary | ICD-10-CM | POA: Diagnosis not present

## 2023-07-03 DIAGNOSIS — R635 Abnormal weight gain: Secondary | ICD-10-CM | POA: Diagnosis not present

## 2023-07-03 DIAGNOSIS — Z23 Encounter for immunization: Secondary | ICD-10-CM | POA: Diagnosis not present

## 2023-07-03 DIAGNOSIS — Z6838 Body mass index (BMI) 38.0-38.9, adult: Secondary | ICD-10-CM | POA: Diagnosis not present

## 2023-07-03 DIAGNOSIS — G894 Chronic pain syndrome: Secondary | ICD-10-CM | POA: Diagnosis not present

## 2023-07-03 DIAGNOSIS — R7309 Other abnormal glucose: Secondary | ICD-10-CM | POA: Diagnosis not present

## 2023-07-03 DIAGNOSIS — B370 Candidal stomatitis: Secondary | ICD-10-CM | POA: Diagnosis not present

## 2023-07-03 DIAGNOSIS — E782 Mixed hyperlipidemia: Secondary | ICD-10-CM | POA: Diagnosis not present

## 2023-07-05 DIAGNOSIS — Z419 Encounter for procedure for purposes other than remedying health state, unspecified: Secondary | ICD-10-CM | POA: Diagnosis not present

## 2023-07-29 DIAGNOSIS — L4050 Arthropathic psoriasis, unspecified: Secondary | ICD-10-CM | POA: Diagnosis not present

## 2023-07-29 DIAGNOSIS — Z79899 Other long term (current) drug therapy: Secondary | ICD-10-CM | POA: Diagnosis not present

## 2023-08-04 DIAGNOSIS — Z419 Encounter for procedure for purposes other than remedying health state, unspecified: Secondary | ICD-10-CM | POA: Diagnosis not present

## 2023-08-20 DIAGNOSIS — M5412 Radiculopathy, cervical region: Secondary | ICD-10-CM | POA: Diagnosis not present

## 2023-08-22 DIAGNOSIS — R635 Abnormal weight gain: Secondary | ICD-10-CM | POA: Diagnosis not present

## 2023-08-26 DIAGNOSIS — Z79899 Other long term (current) drug therapy: Secondary | ICD-10-CM | POA: Diagnosis not present

## 2023-08-26 DIAGNOSIS — L4050 Arthropathic psoriasis, unspecified: Secondary | ICD-10-CM | POA: Diagnosis not present

## 2023-09-04 DIAGNOSIS — Z419 Encounter for procedure for purposes other than remedying health state, unspecified: Secondary | ICD-10-CM | POA: Diagnosis not present

## 2023-09-24 DIAGNOSIS — L4050 Arthropathic psoriasis, unspecified: Secondary | ICD-10-CM | POA: Diagnosis not present

## 2023-09-24 DIAGNOSIS — Z79899 Other long term (current) drug therapy: Secondary | ICD-10-CM | POA: Diagnosis not present

## 2023-09-25 DIAGNOSIS — L4050 Arthropathic psoriasis, unspecified: Secondary | ICD-10-CM | POA: Diagnosis not present

## 2023-09-25 DIAGNOSIS — G894 Chronic pain syndrome: Secondary | ICD-10-CM | POA: Diagnosis not present

## 2023-09-25 DIAGNOSIS — E782 Mixed hyperlipidemia: Secondary | ICD-10-CM | POA: Diagnosis not present

## 2023-09-25 DIAGNOSIS — F411 Generalized anxiety disorder: Secondary | ICD-10-CM | POA: Diagnosis not present

## 2023-09-25 DIAGNOSIS — Z8673 Personal history of transient ischemic attack (TIA), and cerebral infarction without residual deficits: Secondary | ICD-10-CM | POA: Diagnosis not present

## 2023-09-25 DIAGNOSIS — M150 Primary generalized (osteo)arthritis: Secondary | ICD-10-CM | POA: Diagnosis not present

## 2023-09-25 DIAGNOSIS — E66813 Obesity, class 3: Secondary | ICD-10-CM | POA: Diagnosis not present

## 2023-09-25 DIAGNOSIS — Z6841 Body Mass Index (BMI) 40.0 and over, adult: Secondary | ICD-10-CM | POA: Diagnosis not present

## 2023-09-25 DIAGNOSIS — G4709 Other insomnia: Secondary | ICD-10-CM | POA: Diagnosis not present

## 2023-10-05 DIAGNOSIS — Z419 Encounter for procedure for purposes other than remedying health state, unspecified: Secondary | ICD-10-CM | POA: Diagnosis not present

## 2023-10-14 DIAGNOSIS — L4050 Arthropathic psoriasis, unspecified: Secondary | ICD-10-CM | POA: Diagnosis not present

## 2023-10-14 DIAGNOSIS — D509 Iron deficiency anemia, unspecified: Secondary | ICD-10-CM | POA: Diagnosis not present

## 2023-10-14 DIAGNOSIS — M150 Primary generalized (osteo)arthritis: Secondary | ICD-10-CM | POA: Diagnosis not present

## 2023-10-14 DIAGNOSIS — G894 Chronic pain syndrome: Secondary | ICD-10-CM | POA: Diagnosis not present

## 2023-10-14 DIAGNOSIS — T85848S Pain due to other internal prosthetic devices, implants and grafts, sequela: Secondary | ICD-10-CM | POA: Diagnosis not present

## 2023-10-14 DIAGNOSIS — Z79899 Other long term (current) drug therapy: Secondary | ICD-10-CM | POA: Diagnosis not present

## 2023-10-17 DIAGNOSIS — G894 Chronic pain syndrome: Secondary | ICD-10-CM | POA: Diagnosis not present

## 2023-10-17 DIAGNOSIS — L4050 Arthropathic psoriasis, unspecified: Secondary | ICD-10-CM | POA: Diagnosis not present

## 2023-10-17 DIAGNOSIS — M5412 Radiculopathy, cervical region: Secondary | ICD-10-CM | POA: Diagnosis not present

## 2023-10-21 DIAGNOSIS — D509 Iron deficiency anemia, unspecified: Secondary | ICD-10-CM | POA: Diagnosis not present

## 2023-10-22 DIAGNOSIS — L4050 Arthropathic psoriasis, unspecified: Secondary | ICD-10-CM | POA: Diagnosis not present

## 2023-10-22 DIAGNOSIS — Z79899 Other long term (current) drug therapy: Secondary | ICD-10-CM | POA: Diagnosis not present

## 2023-10-28 DIAGNOSIS — D509 Iron deficiency anemia, unspecified: Secondary | ICD-10-CM | POA: Diagnosis not present

## 2023-11-02 DIAGNOSIS — Z419 Encounter for procedure for purposes other than remedying health state, unspecified: Secondary | ICD-10-CM | POA: Diagnosis not present

## 2023-11-06 DIAGNOSIS — E876 Hypokalemia: Secondary | ICD-10-CM | POA: Diagnosis not present

## 2023-11-06 DIAGNOSIS — R600 Localized edema: Secondary | ICD-10-CM | POA: Diagnosis not present

## 2023-11-06 DIAGNOSIS — D509 Iron deficiency anemia, unspecified: Secondary | ICD-10-CM | POA: Diagnosis not present

## 2023-11-07 DIAGNOSIS — D509 Iron deficiency anemia, unspecified: Secondary | ICD-10-CM | POA: Diagnosis not present

## 2023-11-07 DIAGNOSIS — E876 Hypokalemia: Secondary | ICD-10-CM | POA: Diagnosis not present

## 2023-11-18 DIAGNOSIS — D509 Iron deficiency anemia, unspecified: Secondary | ICD-10-CM | POA: Diagnosis not present

## 2023-11-19 DIAGNOSIS — L4050 Arthropathic psoriasis, unspecified: Secondary | ICD-10-CM | POA: Diagnosis not present

## 2023-11-19 DIAGNOSIS — Z79899 Other long term (current) drug therapy: Secondary | ICD-10-CM | POA: Diagnosis not present

## 2023-11-19 DIAGNOSIS — D509 Iron deficiency anemia, unspecified: Secondary | ICD-10-CM | POA: Diagnosis not present

## 2023-12-02 DIAGNOSIS — D509 Iron deficiency anemia, unspecified: Secondary | ICD-10-CM | POA: Diagnosis not present

## 2023-12-09 DIAGNOSIS — D509 Iron deficiency anemia, unspecified: Secondary | ICD-10-CM | POA: Diagnosis not present

## 2023-12-11 DIAGNOSIS — Z8673 Personal history of transient ischemic attack (TIA), and cerebral infarction without residual deficits: Secondary | ICD-10-CM | POA: Diagnosis not present

## 2023-12-11 DIAGNOSIS — M150 Primary generalized (osteo)arthritis: Secondary | ICD-10-CM | POA: Diagnosis not present

## 2023-12-11 DIAGNOSIS — E66813 Obesity, class 3: Secondary | ICD-10-CM | POA: Diagnosis not present

## 2023-12-11 DIAGNOSIS — Z6841 Body Mass Index (BMI) 40.0 and over, adult: Secondary | ICD-10-CM | POA: Diagnosis not present

## 2023-12-14 DIAGNOSIS — Z419 Encounter for procedure for purposes other than remedying health state, unspecified: Secondary | ICD-10-CM | POA: Diagnosis not present

## 2023-12-17 DIAGNOSIS — L4050 Arthropathic psoriasis, unspecified: Secondary | ICD-10-CM | POA: Diagnosis not present

## 2023-12-17 DIAGNOSIS — Z79899 Other long term (current) drug therapy: Secondary | ICD-10-CM | POA: Diagnosis not present

## 2023-12-18 DIAGNOSIS — M5412 Radiculopathy, cervical region: Secondary | ICD-10-CM | POA: Diagnosis not present

## 2023-12-23 DIAGNOSIS — M5412 Radiculopathy, cervical region: Secondary | ICD-10-CM | POA: Diagnosis not present

## 2023-12-23 DIAGNOSIS — Z981 Arthrodesis status: Secondary | ICD-10-CM | POA: Diagnosis not present

## 2024-01-08 DIAGNOSIS — M4802 Spinal stenosis, cervical region: Secondary | ICD-10-CM | POA: Diagnosis not present

## 2024-01-08 DIAGNOSIS — Z981 Arthrodesis status: Secondary | ICD-10-CM | POA: Diagnosis not present

## 2024-01-08 DIAGNOSIS — M2578 Osteophyte, vertebrae: Secondary | ICD-10-CM | POA: Diagnosis not present

## 2024-01-08 DIAGNOSIS — Z96612 Presence of left artificial shoulder joint: Secondary | ICD-10-CM | POA: Diagnosis not present

## 2024-01-08 DIAGNOSIS — M19012 Primary osteoarthritis, left shoulder: Secondary | ICD-10-CM | POA: Diagnosis not present

## 2024-01-08 DIAGNOSIS — M4722 Other spondylosis with radiculopathy, cervical region: Secondary | ICD-10-CM | POA: Diagnosis not present

## 2024-01-08 DIAGNOSIS — Z96611 Presence of right artificial shoulder joint: Secondary | ICD-10-CM | POA: Diagnosis not present

## 2024-01-08 DIAGNOSIS — M5412 Radiculopathy, cervical region: Secondary | ICD-10-CM | POA: Diagnosis not present

## 2024-01-13 DIAGNOSIS — Z419 Encounter for procedure for purposes other than remedying health state, unspecified: Secondary | ICD-10-CM | POA: Diagnosis not present

## 2024-01-14 DIAGNOSIS — M961 Postlaminectomy syndrome, not elsewhere classified: Secondary | ICD-10-CM | POA: Diagnosis not present

## 2024-01-14 DIAGNOSIS — G894 Chronic pain syndrome: Secondary | ICD-10-CM | POA: Diagnosis not present

## 2024-01-14 DIAGNOSIS — Z79899 Other long term (current) drug therapy: Secondary | ICD-10-CM | POA: Diagnosis not present

## 2024-01-14 DIAGNOSIS — L4050 Arthropathic psoriasis, unspecified: Secondary | ICD-10-CM | POA: Diagnosis not present

## 2024-01-15 DIAGNOSIS — Z79899 Other long term (current) drug therapy: Secondary | ICD-10-CM | POA: Diagnosis not present

## 2024-01-15 DIAGNOSIS — G894 Chronic pain syndrome: Secondary | ICD-10-CM | POA: Diagnosis not present

## 2024-01-20 DIAGNOSIS — L4050 Arthropathic psoriasis, unspecified: Secondary | ICD-10-CM | POA: Diagnosis not present

## 2024-01-20 DIAGNOSIS — G894 Chronic pain syndrome: Secondary | ICD-10-CM | POA: Diagnosis not present

## 2024-01-20 DIAGNOSIS — M150 Primary generalized (osteo)arthritis: Secondary | ICD-10-CM | POA: Diagnosis not present

## 2024-01-20 DIAGNOSIS — Z96649 Presence of unspecified artificial hip joint: Secondary | ICD-10-CM | POA: Diagnosis not present

## 2024-01-20 DIAGNOSIS — M2141 Flat foot [pes planus] (acquired), right foot: Secondary | ICD-10-CM | POA: Diagnosis not present

## 2024-01-20 DIAGNOSIS — T85848S Pain due to other internal prosthetic devices, implants and grafts, sequela: Secondary | ICD-10-CM | POA: Diagnosis not present

## 2024-01-20 DIAGNOSIS — M961 Postlaminectomy syndrome, not elsewhere classified: Secondary | ICD-10-CM | POA: Diagnosis not present

## 2024-01-20 DIAGNOSIS — Z79899 Other long term (current) drug therapy: Secondary | ICD-10-CM | POA: Diagnosis not present

## 2024-01-31 DIAGNOSIS — E876 Hypokalemia: Secondary | ICD-10-CM | POA: Diagnosis not present

## 2024-01-31 DIAGNOSIS — E782 Mixed hyperlipidemia: Secondary | ICD-10-CM | POA: Diagnosis not present

## 2024-01-31 DIAGNOSIS — F411 Generalized anxiety disorder: Secondary | ICD-10-CM | POA: Diagnosis not present

## 2024-01-31 DIAGNOSIS — T502X5A Adverse effect of carbonic-anhydrase inhibitors, benzothiadiazides and other diuretics, initial encounter: Secondary | ICD-10-CM | POA: Diagnosis not present

## 2024-01-31 DIAGNOSIS — Z1231 Encounter for screening mammogram for malignant neoplasm of breast: Secondary | ICD-10-CM | POA: Diagnosis not present

## 2024-01-31 DIAGNOSIS — L4050 Arthropathic psoriasis, unspecified: Secondary | ICD-10-CM | POA: Diagnosis not present

## 2024-01-31 DIAGNOSIS — G4709 Other insomnia: Secondary | ICD-10-CM | POA: Diagnosis not present

## 2024-01-31 DIAGNOSIS — D509 Iron deficiency anemia, unspecified: Secondary | ICD-10-CM | POA: Diagnosis not present

## 2024-01-31 DIAGNOSIS — I10 Essential (primary) hypertension: Secondary | ICD-10-CM | POA: Diagnosis not present

## 2024-02-04 DIAGNOSIS — M79671 Pain in right foot: Secondary | ICD-10-CM | POA: Diagnosis not present

## 2024-02-04 DIAGNOSIS — M79672 Pain in left foot: Secondary | ICD-10-CM | POA: Diagnosis not present

## 2024-02-11 DIAGNOSIS — Z79899 Other long term (current) drug therapy: Secondary | ICD-10-CM | POA: Diagnosis not present

## 2024-02-11 DIAGNOSIS — L4050 Arthropathic psoriasis, unspecified: Secondary | ICD-10-CM | POA: Diagnosis not present

## 2024-02-13 DIAGNOSIS — Z419 Encounter for procedure for purposes other than remedying health state, unspecified: Secondary | ICD-10-CM | POA: Diagnosis not present

## 2024-03-04 DIAGNOSIS — Z01818 Encounter for other preprocedural examination: Secondary | ICD-10-CM | POA: Diagnosis not present

## 2024-03-04 DIAGNOSIS — M5412 Radiculopathy, cervical region: Secondary | ICD-10-CM | POA: Diagnosis not present

## 2024-03-04 DIAGNOSIS — E785 Hyperlipidemia, unspecified: Secondary | ICD-10-CM | POA: Diagnosis not present

## 2024-03-04 DIAGNOSIS — R011 Cardiac murmur, unspecified: Secondary | ICD-10-CM | POA: Diagnosis not present

## 2024-03-04 DIAGNOSIS — D649 Anemia, unspecified: Secondary | ICD-10-CM | POA: Diagnosis not present

## 2024-03-04 DIAGNOSIS — R7301 Impaired fasting glucose: Secondary | ICD-10-CM | POA: Diagnosis not present

## 2024-03-04 DIAGNOSIS — Z8673 Personal history of transient ischemic attack (TIA), and cerebral infarction without residual deficits: Secondary | ICD-10-CM | POA: Diagnosis not present

## 2024-03-04 DIAGNOSIS — I10 Essential (primary) hypertension: Secondary | ICD-10-CM | POA: Diagnosis not present

## 2024-03-11 DIAGNOSIS — S14109A Unspecified injury at unspecified level of cervical spinal cord, initial encounter: Secondary | ICD-10-CM | POA: Diagnosis not present

## 2024-03-11 DIAGNOSIS — M5412 Radiculopathy, cervical region: Secondary | ICD-10-CM | POA: Diagnosis not present

## 2024-03-11 DIAGNOSIS — D849 Immunodeficiency, unspecified: Secondary | ICD-10-CM | POA: Diagnosis not present

## 2024-03-11 DIAGNOSIS — M5441 Lumbago with sciatica, right side: Secondary | ICD-10-CM | POA: Diagnosis not present

## 2024-03-11 DIAGNOSIS — G9529 Other cord compression: Secondary | ICD-10-CM | POA: Diagnosis not present

## 2024-03-11 DIAGNOSIS — E669 Obesity, unspecified: Secondary | ICD-10-CM | POA: Diagnosis not present

## 2024-03-11 DIAGNOSIS — G894 Chronic pain syndrome: Secondary | ICD-10-CM | POA: Diagnosis not present

## 2024-03-11 DIAGNOSIS — L4050 Arthropathic psoriasis, unspecified: Secondary | ICD-10-CM | POA: Diagnosis not present

## 2024-03-11 DIAGNOSIS — Z8673 Personal history of transient ischemic attack (TIA), and cerebral infarction without residual deficits: Secondary | ICD-10-CM | POA: Diagnosis not present

## 2024-03-11 DIAGNOSIS — Z981 Arthrodesis status: Secondary | ICD-10-CM | POA: Diagnosis not present

## 2024-03-11 DIAGNOSIS — D500 Iron deficiency anemia secondary to blood loss (chronic): Secondary | ICD-10-CM | POA: Diagnosis not present

## 2024-03-11 DIAGNOSIS — Z85841 Personal history of malignant neoplasm of brain: Secondary | ICD-10-CM | POA: Diagnosis not present

## 2024-03-11 DIAGNOSIS — G2581 Restless legs syndrome: Secondary | ICD-10-CM | POA: Diagnosis not present

## 2024-03-11 DIAGNOSIS — M4322 Fusion of spine, cervical region: Secondary | ICD-10-CM | POA: Diagnosis not present

## 2024-03-11 DIAGNOSIS — J45909 Unspecified asthma, uncomplicated: Secondary | ICD-10-CM | POA: Diagnosis not present

## 2024-03-11 DIAGNOSIS — I10 Essential (primary) hypertension: Secondary | ICD-10-CM | POA: Diagnosis not present

## 2024-03-12 DIAGNOSIS — M542 Cervicalgia: Secondary | ICD-10-CM | POA: Diagnosis not present

## 2024-03-12 DIAGNOSIS — M4322 Fusion of spine, cervical region: Secondary | ICD-10-CM | POA: Diagnosis not present

## 2024-03-12 DIAGNOSIS — M5412 Radiculopathy, cervical region: Secondary | ICD-10-CM | POA: Diagnosis not present

## 2024-03-13 DIAGNOSIS — T8149XA Infection following a procedure, other surgical site, initial encounter: Secondary | ICD-10-CM | POA: Diagnosis not present

## 2024-03-13 DIAGNOSIS — M5412 Radiculopathy, cervical region: Secondary | ICD-10-CM | POA: Diagnosis not present

## 2024-03-14 DIAGNOSIS — Z419 Encounter for procedure for purposes other than remedying health state, unspecified: Secondary | ICD-10-CM | POA: Diagnosis not present

## 2024-03-19 DIAGNOSIS — Z981 Arthrodesis status: Secondary | ICD-10-CM | POA: Diagnosis not present

## 2024-03-25 DIAGNOSIS — Z981 Arthrodesis status: Secondary | ICD-10-CM | POA: Diagnosis not present

## 2024-03-25 DIAGNOSIS — M5412 Radiculopathy, cervical region: Secondary | ICD-10-CM | POA: Diagnosis not present

## 2024-03-30 DIAGNOSIS — Z8673 Personal history of transient ischemic attack (TIA), and cerebral infarction without residual deficits: Secondary | ICD-10-CM | POA: Diagnosis not present

## 2024-03-30 DIAGNOSIS — E782 Mixed hyperlipidemia: Secondary | ICD-10-CM | POA: Diagnosis not present

## 2024-03-30 DIAGNOSIS — G2581 Restless legs syndrome: Secondary | ICD-10-CM | POA: Diagnosis not present

## 2024-03-30 DIAGNOSIS — Z96643 Presence of artificial hip joint, bilateral: Secondary | ICD-10-CM | POA: Diagnosis not present

## 2024-03-30 DIAGNOSIS — I10 Essential (primary) hypertension: Secondary | ICD-10-CM | POA: Diagnosis not present

## 2024-03-30 DIAGNOSIS — E669 Obesity, unspecified: Secondary | ICD-10-CM | POA: Diagnosis not present

## 2024-03-30 DIAGNOSIS — Z96611 Presence of right artificial shoulder joint: Secondary | ICD-10-CM | POA: Diagnosis not present

## 2024-03-30 DIAGNOSIS — J45909 Unspecified asthma, uncomplicated: Secondary | ICD-10-CM | POA: Diagnosis not present

## 2024-03-30 DIAGNOSIS — K219 Gastro-esophageal reflux disease without esophagitis: Secondary | ICD-10-CM | POA: Diagnosis not present

## 2024-03-30 DIAGNOSIS — G8918 Other acute postprocedural pain: Secondary | ICD-10-CM | POA: Diagnosis not present

## 2024-03-30 DIAGNOSIS — L4050 Arthropathic psoriasis, unspecified: Secondary | ICD-10-CM | POA: Diagnosis not present

## 2024-03-30 DIAGNOSIS — Z9889 Other specified postprocedural states: Secondary | ICD-10-CM | POA: Diagnosis not present

## 2024-03-30 DIAGNOSIS — F39 Unspecified mood [affective] disorder: Secondary | ICD-10-CM | POA: Diagnosis not present

## 2024-03-30 DIAGNOSIS — M542 Cervicalgia: Secondary | ICD-10-CM | POA: Diagnosis not present

## 2024-03-30 DIAGNOSIS — R202 Paresthesia of skin: Secondary | ICD-10-CM | POA: Diagnosis not present

## 2024-03-30 DIAGNOSIS — R937 Abnormal findings on diagnostic imaging of other parts of musculoskeletal system: Secondary | ICD-10-CM | POA: Diagnosis not present

## 2024-03-31 DIAGNOSIS — K219 Gastro-esophageal reflux disease without esophagitis: Secondary | ICD-10-CM | POA: Diagnosis not present

## 2024-03-31 DIAGNOSIS — I10 Essential (primary) hypertension: Secondary | ICD-10-CM | POA: Diagnosis not present

## 2024-03-31 DIAGNOSIS — F39 Unspecified mood [affective] disorder: Secondary | ICD-10-CM | POA: Diagnosis not present

## 2024-03-31 DIAGNOSIS — G8918 Other acute postprocedural pain: Secondary | ICD-10-CM | POA: Diagnosis not present

## 2024-03-31 DIAGNOSIS — M1990 Unspecified osteoarthritis, unspecified site: Secondary | ICD-10-CM | POA: Diagnosis not present

## 2024-03-31 DIAGNOSIS — L4050 Arthropathic psoriasis, unspecified: Secondary | ICD-10-CM | POA: Diagnosis not present

## 2024-03-31 DIAGNOSIS — E782 Mixed hyperlipidemia: Secondary | ICD-10-CM | POA: Diagnosis not present

## 2024-04-14 DIAGNOSIS — Z419 Encounter for procedure for purposes other than remedying health state, unspecified: Secondary | ICD-10-CM | POA: Diagnosis not present

## 2024-04-22 DIAGNOSIS — Z4789 Encounter for other orthopedic aftercare: Secondary | ICD-10-CM | POA: Diagnosis not present

## 2024-04-22 DIAGNOSIS — M5412 Radiculopathy, cervical region: Secondary | ICD-10-CM | POA: Diagnosis not present

## 2024-04-22 DIAGNOSIS — Z9889 Other specified postprocedural states: Secondary | ICD-10-CM | POA: Diagnosis not present

## 2024-04-22 DIAGNOSIS — Z981 Arthrodesis status: Secondary | ICD-10-CM | POA: Diagnosis not present

## 2024-04-22 DIAGNOSIS — M7989 Other specified soft tissue disorders: Secondary | ICD-10-CM | POA: Diagnosis not present

## 2024-05-12 DIAGNOSIS — M961 Postlaminectomy syndrome, not elsewhere classified: Secondary | ICD-10-CM | POA: Diagnosis not present

## 2024-05-12 DIAGNOSIS — G894 Chronic pain syndrome: Secondary | ICD-10-CM | POA: Diagnosis not present

## 2024-05-12 DIAGNOSIS — L4050 Arthropathic psoriasis, unspecified: Secondary | ICD-10-CM | POA: Diagnosis not present

## 2024-05-15 DIAGNOSIS — Z419 Encounter for procedure for purposes other than remedying health state, unspecified: Secondary | ICD-10-CM | POA: Diagnosis not present

## 2024-05-20 DIAGNOSIS — G894 Chronic pain syndrome: Secondary | ICD-10-CM | POA: Diagnosis not present

## 2024-05-20 DIAGNOSIS — M150 Primary generalized (osteo)arthritis: Secondary | ICD-10-CM | POA: Diagnosis not present

## 2024-05-20 DIAGNOSIS — L4050 Arthropathic psoriasis, unspecified: Secondary | ICD-10-CM | POA: Diagnosis not present

## 2024-05-20 DIAGNOSIS — Z79899 Other long term (current) drug therapy: Secondary | ICD-10-CM | POA: Diagnosis not present

## 2024-05-25 DIAGNOSIS — M6281 Muscle weakness (generalized): Secondary | ICD-10-CM | POA: Diagnosis not present

## 2024-05-25 DIAGNOSIS — Z96649 Presence of unspecified artificial hip joint: Secondary | ICD-10-CM | POA: Diagnosis not present

## 2024-05-25 DIAGNOSIS — R262 Difficulty in walking, not elsewhere classified: Secondary | ICD-10-CM | POA: Diagnosis not present

## 2024-05-25 DIAGNOSIS — M2560 Stiffness of unspecified joint, not elsewhere classified: Secondary | ICD-10-CM | POA: Diagnosis not present

## 2024-06-01 DIAGNOSIS — L4050 Arthropathic psoriasis, unspecified: Secondary | ICD-10-CM | POA: Diagnosis not present

## 2024-06-01 DIAGNOSIS — Z79899 Other long term (current) drug therapy: Secondary | ICD-10-CM | POA: Diagnosis not present

## 2024-06-10 DIAGNOSIS — R29898 Other symptoms and signs involving the musculoskeletal system: Secondary | ICD-10-CM | POA: Diagnosis not present

## 2024-06-10 DIAGNOSIS — Z789 Other specified health status: Secondary | ICD-10-CM | POA: Diagnosis not present

## 2024-06-12 DIAGNOSIS — M2560 Stiffness of unspecified joint, not elsewhere classified: Secondary | ICD-10-CM | POA: Diagnosis not present

## 2024-06-12 DIAGNOSIS — R262 Difficulty in walking, not elsewhere classified: Secondary | ICD-10-CM | POA: Diagnosis not present

## 2024-06-12 DIAGNOSIS — M6281 Muscle weakness (generalized): Secondary | ICD-10-CM | POA: Diagnosis not present

## 2024-06-12 DIAGNOSIS — Z96649 Presence of unspecified artificial hip joint: Secondary | ICD-10-CM | POA: Diagnosis not present

## 2024-06-16 DIAGNOSIS — E66813 Obesity, class 3: Secondary | ICD-10-CM | POA: Diagnosis not present

## 2024-06-16 DIAGNOSIS — Z8673 Personal history of transient ischemic attack (TIA), and cerebral infarction without residual deficits: Secondary | ICD-10-CM | POA: Diagnosis not present

## 2024-06-16 DIAGNOSIS — Z6841 Body Mass Index (BMI) 40.0 and over, adult: Secondary | ICD-10-CM | POA: Diagnosis not present

## 2024-06-18 DIAGNOSIS — R262 Difficulty in walking, not elsewhere classified: Secondary | ICD-10-CM | POA: Diagnosis not present

## 2024-06-18 DIAGNOSIS — M6281 Muscle weakness (generalized): Secondary | ICD-10-CM | POA: Diagnosis not present

## 2024-06-18 DIAGNOSIS — Z96649 Presence of unspecified artificial hip joint: Secondary | ICD-10-CM | POA: Diagnosis not present

## 2024-06-18 DIAGNOSIS — M2560 Stiffness of unspecified joint, not elsewhere classified: Secondary | ICD-10-CM | POA: Diagnosis not present

## 2024-06-22 DIAGNOSIS — Z96649 Presence of unspecified artificial hip joint: Secondary | ICD-10-CM | POA: Diagnosis not present

## 2024-06-22 DIAGNOSIS — M6281 Muscle weakness (generalized): Secondary | ICD-10-CM | POA: Diagnosis not present

## 2024-06-22 DIAGNOSIS — M2560 Stiffness of unspecified joint, not elsewhere classified: Secondary | ICD-10-CM | POA: Diagnosis not present

## 2024-06-22 DIAGNOSIS — R262 Difficulty in walking, not elsewhere classified: Secondary | ICD-10-CM | POA: Diagnosis not present

## 2024-06-24 DIAGNOSIS — Z789 Other specified health status: Secondary | ICD-10-CM | POA: Diagnosis not present

## 2024-06-24 DIAGNOSIS — R29898 Other symptoms and signs involving the musculoskeletal system: Secondary | ICD-10-CM | POA: Diagnosis not present

## 2024-06-24 DIAGNOSIS — R531 Weakness: Secondary | ICD-10-CM | POA: Diagnosis not present

## 2024-06-29 DIAGNOSIS — Z79899 Other long term (current) drug therapy: Secondary | ICD-10-CM | POA: Diagnosis not present

## 2024-06-29 DIAGNOSIS — L4050 Arthropathic psoriasis, unspecified: Secondary | ICD-10-CM | POA: Diagnosis not present

## 2024-07-01 DIAGNOSIS — R29898 Other symptoms and signs involving the musculoskeletal system: Secondary | ICD-10-CM | POA: Diagnosis not present

## 2024-07-01 DIAGNOSIS — R531 Weakness: Secondary | ICD-10-CM | POA: Diagnosis not present

## 2024-07-01 DIAGNOSIS — Z789 Other specified health status: Secondary | ICD-10-CM | POA: Diagnosis not present

## 2024-07-03 DIAGNOSIS — M2560 Stiffness of unspecified joint, not elsewhere classified: Secondary | ICD-10-CM | POA: Diagnosis not present

## 2024-07-03 DIAGNOSIS — Z96649 Presence of unspecified artificial hip joint: Secondary | ICD-10-CM | POA: Diagnosis not present

## 2024-07-03 DIAGNOSIS — R262 Difficulty in walking, not elsewhere classified: Secondary | ICD-10-CM | POA: Diagnosis not present

## 2024-07-03 DIAGNOSIS — M6281 Muscle weakness (generalized): Secondary | ICD-10-CM | POA: Diagnosis not present

## 2024-07-09 DIAGNOSIS — M2560 Stiffness of unspecified joint, not elsewhere classified: Secondary | ICD-10-CM | POA: Diagnosis not present

## 2024-07-09 DIAGNOSIS — M6281 Muscle weakness (generalized): Secondary | ICD-10-CM | POA: Diagnosis not present

## 2024-07-09 DIAGNOSIS — R262 Difficulty in walking, not elsewhere classified: Secondary | ICD-10-CM | POA: Diagnosis not present

## 2024-07-09 DIAGNOSIS — Z96649 Presence of unspecified artificial hip joint: Secondary | ICD-10-CM | POA: Diagnosis not present

## 2024-07-09 DIAGNOSIS — Z981 Arthrodesis status: Secondary | ICD-10-CM | POA: Diagnosis not present

## 2024-07-10 DIAGNOSIS — K219 Gastro-esophageal reflux disease without esophagitis: Secondary | ICD-10-CM | POA: Diagnosis not present

## 2024-07-10 DIAGNOSIS — D509 Iron deficiency anemia, unspecified: Secondary | ICD-10-CM | POA: Diagnosis not present

## 2024-07-10 DIAGNOSIS — E782 Mixed hyperlipidemia: Secondary | ICD-10-CM | POA: Diagnosis not present

## 2024-07-10 DIAGNOSIS — T502X5A Adverse effect of carbonic-anhydrase inhibitors, benzothiadiazides and other diuretics, initial encounter: Secondary | ICD-10-CM | POA: Diagnosis not present

## 2024-07-10 DIAGNOSIS — R600 Localized edema: Secondary | ICD-10-CM | POA: Diagnosis not present

## 2024-07-10 DIAGNOSIS — I10 Essential (primary) hypertension: Secondary | ICD-10-CM | POA: Diagnosis not present

## 2024-07-10 DIAGNOSIS — E876 Hypokalemia: Secondary | ICD-10-CM | POA: Diagnosis not present

## 2024-07-10 DIAGNOSIS — F411 Generalized anxiety disorder: Secondary | ICD-10-CM | POA: Diagnosis not present

## 2024-07-10 DIAGNOSIS — G4709 Other insomnia: Secondary | ICD-10-CM | POA: Diagnosis not present

## 2024-07-13 DIAGNOSIS — Z981 Arthrodesis status: Secondary | ICD-10-CM | POA: Diagnosis not present

## 2024-07-13 DIAGNOSIS — M542 Cervicalgia: Secondary | ICD-10-CM | POA: Diagnosis not present

## 2024-07-15 DIAGNOSIS — Z789 Other specified health status: Secondary | ICD-10-CM | POA: Diagnosis not present

## 2024-07-15 DIAGNOSIS — R29898 Other symptoms and signs involving the musculoskeletal system: Secondary | ICD-10-CM | POA: Diagnosis not present

## 2024-07-17 DIAGNOSIS — M2560 Stiffness of unspecified joint, not elsewhere classified: Secondary | ICD-10-CM | POA: Diagnosis not present

## 2024-07-17 DIAGNOSIS — Z981 Arthrodesis status: Secondary | ICD-10-CM | POA: Diagnosis not present

## 2024-07-17 DIAGNOSIS — Z96649 Presence of unspecified artificial hip joint: Secondary | ICD-10-CM | POA: Diagnosis not present

## 2024-07-17 DIAGNOSIS — S61011A Laceration without foreign body of right thumb without damage to nail, initial encounter: Secondary | ICD-10-CM | POA: Diagnosis not present

## 2024-07-17 DIAGNOSIS — M6281 Muscle weakness (generalized): Secondary | ICD-10-CM | POA: Diagnosis not present

## 2024-07-27 DIAGNOSIS — Z79899 Other long term (current) drug therapy: Secondary | ICD-10-CM | POA: Diagnosis not present

## 2024-07-27 DIAGNOSIS — L4050 Arthropathic psoriasis, unspecified: Secondary | ICD-10-CM | POA: Diagnosis not present

## 2024-08-03 DIAGNOSIS — Z0001 Encounter for general adult medical examination with abnormal findings: Secondary | ICD-10-CM | POA: Diagnosis not present

## 2024-08-03 DIAGNOSIS — D649 Anemia, unspecified: Secondary | ICD-10-CM | POA: Diagnosis not present

## 2024-08-03 DIAGNOSIS — E782 Mixed hyperlipidemia: Secondary | ICD-10-CM | POA: Diagnosis not present

## 2024-08-03 DIAGNOSIS — Z124 Encounter for screening for malignant neoplasm of cervix: Secondary | ICD-10-CM | POA: Diagnosis not present

## 2024-08-04 DIAGNOSIS — M25511 Pain in right shoulder: Secondary | ICD-10-CM | POA: Diagnosis not present

## 2024-08-04 DIAGNOSIS — Z96612 Presence of left artificial shoulder joint: Secondary | ICD-10-CM | POA: Diagnosis not present

## 2024-08-04 DIAGNOSIS — R29898 Other symptoms and signs involving the musculoskeletal system: Secondary | ICD-10-CM | POA: Diagnosis not present

## 2024-08-04 DIAGNOSIS — Z471 Aftercare following joint replacement surgery: Secondary | ICD-10-CM | POA: Diagnosis not present

## 2024-08-04 DIAGNOSIS — Z96611 Presence of right artificial shoulder joint: Secondary | ICD-10-CM | POA: Diagnosis not present

## 2024-08-11 DIAGNOSIS — L4050 Arthropathic psoriasis, unspecified: Secondary | ICD-10-CM | POA: Diagnosis not present

## 2024-08-11 DIAGNOSIS — M961 Postlaminectomy syndrome, not elsewhere classified: Secondary | ICD-10-CM | POA: Diagnosis not present

## 2024-08-11 DIAGNOSIS — G894 Chronic pain syndrome: Secondary | ICD-10-CM | POA: Diagnosis not present

## 2024-08-14 DIAGNOSIS — Z419 Encounter for procedure for purposes other than remedying health state, unspecified: Secondary | ICD-10-CM | POA: Diagnosis not present

## 2024-08-19 DIAGNOSIS — M150 Primary generalized (osteo)arthritis: Secondary | ICD-10-CM | POA: Diagnosis not present

## 2024-08-19 DIAGNOSIS — G894 Chronic pain syndrome: Secondary | ICD-10-CM | POA: Diagnosis not present

## 2024-08-19 DIAGNOSIS — Z79899 Other long term (current) drug therapy: Secondary | ICD-10-CM | POA: Diagnosis not present

## 2024-08-19 DIAGNOSIS — L4050 Arthropathic psoriasis, unspecified: Secondary | ICD-10-CM | POA: Diagnosis not present

## 2024-08-24 DIAGNOSIS — Z79899 Other long term (current) drug therapy: Secondary | ICD-10-CM | POA: Diagnosis not present

## 2024-08-25 DIAGNOSIS — D509 Iron deficiency anemia, unspecified: Secondary | ICD-10-CM | POA: Diagnosis not present
